# Patient Record
Sex: Female | Born: 1935 | Race: Black or African American | Hispanic: No | Smoking: Never smoker
Health system: Southern US, Community
[De-identification: ages and names within clinical notes are randomized; demographics above are authoritative.]

## PROBLEM LIST (undated history)

## (undated) DIAGNOSIS — G629 Polyneuropathy, unspecified: Secondary | ICD-10-CM

## (undated) DIAGNOSIS — G4733 Obstructive sleep apnea (adult) (pediatric): Secondary | ICD-10-CM

## (undated) DIAGNOSIS — I10 Essential (primary) hypertension: Secondary | ICD-10-CM

## (undated) DIAGNOSIS — D689 Coagulation defect, unspecified: Secondary | ICD-10-CM

## (undated) DIAGNOSIS — I639 Cerebral infarction, unspecified: Secondary | ICD-10-CM

## (undated) DIAGNOSIS — E785 Hyperlipidemia, unspecified: Secondary | ICD-10-CM

## (undated) DIAGNOSIS — I2510 Atherosclerotic heart disease of native coronary artery without angina pectoris: Secondary | ICD-10-CM

## (undated) DIAGNOSIS — I4819 Other persistent atrial fibrillation: Secondary | ICD-10-CM

## (undated) DIAGNOSIS — R569 Unspecified convulsions: Secondary | ICD-10-CM

## (undated) DIAGNOSIS — I2699 Other pulmonary embolism without acute cor pulmonale: Secondary | ICD-10-CM

## (undated) DIAGNOSIS — K56609 Unspecified intestinal obstruction, unspecified as to partial versus complete obstruction: Secondary | ICD-10-CM

## (undated) DIAGNOSIS — F0390 Unspecified dementia without behavioral disturbance: Secondary | ICD-10-CM

## (undated) DIAGNOSIS — I509 Heart failure, unspecified: Secondary | ICD-10-CM

## (undated) DIAGNOSIS — I82409 Acute embolism and thrombosis of unspecified deep veins of unspecified lower extremity: Secondary | ICD-10-CM

## (undated) HISTORY — DX: Hyperlipidemia, unspecified: E78.5

## (undated) HISTORY — PX: CHOLECYSTECTOMY: SHX55

## (undated) HISTORY — DX: Coagulation defect, unspecified: D68.9

## (undated) HISTORY — DX: Acute embolism and thrombosis of unspecified deep veins of unspecified lower extremity: I82.409

## (undated) HISTORY — DX: Unspecified dementia, unspecified severity, without behavioral disturbance, psychotic disturbance, mood disturbance, and anxiety: F03.90

## (undated) HISTORY — PX: ABDOMINAL AORTIC ANEURYSM REPAIR: SUR1152

## (undated) HISTORY — DX: Unspecified convulsions: R56.9

## (undated) HISTORY — DX: Essential (primary) hypertension: I10

## (undated) HISTORY — DX: Obstructive sleep apnea (adult) (pediatric): G47.33

## (undated) HISTORY — PX: ABDOMINAL HYSTERECTOMY: SHX81

## (undated) HISTORY — DX: Cerebral infarction, unspecified: I63.9

## (undated) HISTORY — DX: Polyneuropathy, unspecified: G62.9

---

## 2001-01-13 HISTORY — PX: SPINAL FUSION: SHX223

## 2015-01-17 ENCOUNTER — Encounter: Payer: Self-pay | Admitting: Internal Medicine

## 2015-01-17 ENCOUNTER — Non-Acute Institutional Stay (SKILLED_NURSING_FACILITY): Payer: Medicare Other | Admitting: Internal Medicine

## 2015-01-17 DIAGNOSIS — E785 Hyperlipidemia, unspecified: Secondary | ICD-10-CM | POA: Diagnosis not present

## 2015-01-17 DIAGNOSIS — F03918 Unspecified dementia, unspecified severity, with other behavioral disturbance: Secondary | ICD-10-CM

## 2015-01-17 DIAGNOSIS — J398 Other specified diseases of upper respiratory tract: Secondary | ICD-10-CM | POA: Diagnosis not present

## 2015-01-17 DIAGNOSIS — Z86711 Personal history of pulmonary embolism: Secondary | ICD-10-CM | POA: Diagnosis not present

## 2015-01-17 DIAGNOSIS — I110 Hypertensive heart disease with heart failure: Secondary | ICD-10-CM | POA: Insufficient documentation

## 2015-01-17 DIAGNOSIS — I639 Cerebral infarction, unspecified: Secondary | ICD-10-CM | POA: Insufficient documentation

## 2015-01-17 DIAGNOSIS — F329 Major depressive disorder, single episode, unspecified: Secondary | ICD-10-CM

## 2015-01-17 DIAGNOSIS — F0391 Unspecified dementia with behavioral disturbance: Secondary | ICD-10-CM | POA: Diagnosis not present

## 2015-01-17 DIAGNOSIS — I10 Essential (primary) hypertension: Secondary | ICD-10-CM

## 2015-01-17 DIAGNOSIS — N3000 Acute cystitis without hematuria: Secondary | ICD-10-CM

## 2015-01-17 DIAGNOSIS — I4891 Unspecified atrial fibrillation: Secondary | ICD-10-CM

## 2015-01-17 DIAGNOSIS — F32A Depression, unspecified: Secondary | ICD-10-CM

## 2015-01-17 DIAGNOSIS — G4733 Obstructive sleep apnea (adult) (pediatric): Secondary | ICD-10-CM | POA: Insufficient documentation

## 2015-01-17 DIAGNOSIS — G9340 Encephalopathy, unspecified: Secondary | ICD-10-CM | POA: Diagnosis not present

## 2015-01-17 DIAGNOSIS — R569 Unspecified convulsions: Secondary | ICD-10-CM | POA: Diagnosis not present

## 2015-01-17 DIAGNOSIS — D689 Coagulation defect, unspecified: Secondary | ICD-10-CM | POA: Insufficient documentation

## 2015-01-17 NOTE — Progress Notes (Signed)
MRN: [id number] Name: Brandi Heath  Sex: female Age: 82 y.o. DOB: October 16, 1927  [facility]: [facility] Facility/Room:108 Level Of Care: SNF Provider: Merrilee Seashore D Emergency Contacts: No emergency contact information on file.  Code Status:   Allergies: Penicillins  Chief Complaint  Patient presents with  . New Admit To SNF    HPI: Patient is 82 y.o. female with chronic atrial fib,DVT/PE, HTN, seizure disorder and hx CVA who was brought to ED for diarrhea. She was noted to have AF with RVR. She was admitted to [facility] from 12/26- 01/17/2015 where she was tx for AF with a drip then oral med, hypotension, tx with IVF and hyperkalemia. Pt is admitted to SNF for generalized weakness. While at SNF she will be followed for seizures ,tx with dilantin, chronic PE, tx with eliquis and depression, tx with cymbalta.    Past Medical History  Diagnosis Date  . OSA (obstructive sleep apnea)   . Dementia without behavioral disturbance   . Seizures (HCC)   . Hyperlipidemia   . Hypertension   . Clotting disorder (HCC)   . Stroke (HCC)   . DVT (deep venous thrombosis) (HCC)   . Polyneuropathy [facility])     Past Surgical History  Procedure Laterality Date  . Abdominal hysterectomy    . Cholecystectomy    . Abdominal aortic aneurysm repair    . Spinal fusion  2003      Medication List       This list is accurate as of: 01/17/15 11:59 PM.  Always use your most recent med list.               calcium-vitamin D 500-200 MG-UNIT tablet  Take 1 tablet by mouth 2 (two) times daily.     diltiazem 360 MG 24 hr capsule  Commonly known as:  TIAZAC  Take 360 mg by mouth daily.     diphenoxylate-atropine 2.5-0.025 MG tablet  Commonly known as:  LOMOTIL  Take 1 tablet by mouth 4 (four) times daily as needed for diarrhea or loose stools.     donepezil 10 MG tablet  Commonly known as:  ARICEPT  Take 10 mg by mouth at bedtime.     DULoxetine 60 MG capsule  Commonly known as:  CYMBALTA  Take 60 mg by  mouth 2 (two) times daily.     ELIQUIS 2.5 MG Tabs tablet  Generic drug:  apixaban  Take 2.5 mg by mouth 2 (two) times daily.     famotidine 20 MG tablet  Commonly known as:  PEPCID  Take 20 mg by mouth 2 (two) times daily.     fexofenadine 180 MG tablet  Commonly known as:  ALLEGRA  Take 180 mg by mouth daily.     gabapentin 600 MG tablet  Commonly known as:  NEURONTIN  Take 1,200 mg by mouth at bedtime.     metoprolol 50 MG tablet  Commonly known as:  LOPRESSOR  Take 50 mg by mouth 2 (two) times daily.     phenytoin 200 MG ER capsule  Commonly known as:  DILANTIN  Take 200 mg by mouth 2 (two) times daily.        Meds ordered this encounter  Medications  . diltiazem (TIAZAC) 360 MG 24 hr capsule    Sig: Take 360 mg by mouth daily.  Marland Kitchen donepezil (ARICEPT) 10 MG tablet    Sig: Take 10 mg by mouth at bedtime.  . metoprolol (LOPRESSOR) 50 MG tablet  Sig: Take 50 mg by mouth 2 (two) times daily.  . phenytoin (DILANTIN) 200 MG ER capsule    Sig: Take 200 mg by mouth 2 (two) times daily.  . fexofenadine (ALLEGRA) 180 MG tablet    Sig: Take 180 mg by mouth daily.  . Calcium Carbonate-Vitamin D (CALCIUM-VITAMIN D) 500-200 MG-UNIT tablet    Sig: Take 1 tablet by mouth 2 (two) times daily.  . diphenoxylate-atropine (LOMOTIL) 2.5-0.025 MG tablet    Sig: Take 1 tablet by mouth 4 (four) times daily as needed for diarrhea or loose stools.  . DULoxetine (CYMBALTA) 60 MG capsule    Sig: Take 60 mg by mouth 2 (two) times daily.  Marland Kitchen apixaban (ELIQUIS) 2.5 MG TABS tablet    Sig: Take 2.5 mg by mouth 2 (two) times daily.  . famotidine (PEPCID) 20 MG tablet    Sig: Take 20 mg by mouth 2 (two) times daily.  Marland Kitchen gabapentin (NEURONTIN) 600 MG tablet    Sig: Take 1,200 mg by mouth at bedtime.     There is no immunization history on file for this patient.  Social History  Substance Use Topics  . Smoking status: Never Smoker   . Smokeless tobacco: Not on file  . Alcohol Use: No     Family history is + HTN, HD    Review of Systems  DATA OBTAINED: from patient, nurse GENERAL:  no fevers,+ fatigue, appetite changes SKIN: No itching, rash or wounds EYES: No eye pain, redness, discharge EARS: No earache, tinnitus, change in hearing NOSE: No congestion, drainage or bleeding  MOUTH/THROAT: No mouth or tooth pain, No sore throat RESPIRATORY: No cough, wheezing, SOB CARDIAC: No chest pain, palpitations, lower extremity edema  GI: No abdominal pain, No N/V/D or constipation, No heartburn or reflux  GU: No dysuria, frequency or urgency, or incontinence  MUSCULOSKELETAL: No unrelieved bone/joint pain NEUROLOGIC: No headache, dizziness or focal weakness PSYCHIATRIC: No c/o anxiety or sadness   Filed Vitals:   01/17/15 2108  BP: 125/82  Pulse: 68  Temp: 97.3 F (36.3 C)  Resp: 18    SpO2 Readings from Last 1 Encounters:  No data found for SpO2        Physical Exam  GENERAL APPEARANCE: Alert, conversant,  No acute distress.  SKIN: No diaphoresis rash HEAD: Normocephalic, atraumatic  EYES: Conjunctiva/lids clear. Pupils round, reactive. EOMs intact.  EARS: External exam WNL, canals clear. Hearing grossly normal.  NOSE: No deformity or discharge.  MOUTH/THROAT: Lips w/o lesions  RESPIRATORY: Breathing is even, unlabored. Lung sounds are clear   CARDIOVASCULAR: Heart RRR no murmurs, rubs or gallops. No peripheral edema.   GASTROINTESTINAL: Abdomen is soft, non-tender, not distended w/ normal bowel sounds. GENITOURINARY: Bladder non tender, not distended  MUSCULOSKELETAL: No abnormal joints or musculature NEUROLOGIC:  Cranial nerves 2-12 grossly intact. Moves all extremities  PSYCHIATRIC: Mood and affect appropriate to situation, no behavioral issues  Patient Active Problem List   Diagnosis Date Noted  . Atrial fibrillation with RVR (HCC) 01/20/2015  . Acute encephalopathy 01/20/2015  . UTI (urinary tract infection) 01/20/2015  .  Tracheobronchomalacia 01/20/2015  . Personal history of PE (pulmonary embolism) 01/20/2015  . Depression 01/20/2015  . OSA (obstructive sleep apnea)   . Dementia with behavioral disturbance   . Seizures (HCC)   . Hyperlipidemia   . Hypertension   . Clotting disorder (HCC)   . Cerebrovascular accident (CVA) due to thrombosis (HCC)     CBC No results found for: WBC, RBC,  HGB, HCT, PLT, MCV, LYMPHSABS, MONOABS, EOSABS, BASOSABS  CMP  No results found for: NA, K, CL, CO2, GLUCOSE, BUN, CREATININE, CALCIUM, PROT, ALBUMIN, AST, ALT, ALKPHOS, BILITOT, GFRNONAA, GFRAA  No results found for: HGBA1C   Patient was never admitted.  Not all labs, radiology exams or other studies done during hospitalization come through on my EPIC note; however they are reviewed by me.    Assessment and Plan  Atrial fibrillation with RVR (HCC) Tx with IV diltiazem, then converted to oral; SNF - cont metoprolol 50 mg BID, diltiazem 24 hr 360 mg with eliquis as prophylaxis  Acute encephalopathy With underlying dementia;functioning well until recently, has lived with daughter for past month-seen by neurology and started on haldol 0.5 mg prn for periods of agitation at home;sed/hyp were avoided in hospital and pt was stable on haldol prn SNF - cont Haldol prn  Dementia with behavioral disturbance With underlying dementia;functioning well until recently, has lived with daughter for past month-seen by neurology and started on haldol 0.5 mg prn for periods of agitation at home;sed/hyp were avoided in hospital and pt was stable on haldol prn SNF - cont prn haldol and aricept 10 mg nightly  UTI (urinary tract infection) Pt had been treated for UTI prior to admit, but 2/2 diarrhea pt was not put on abx in hospital;new urine cx was neg  Hypertension Initially pt was hypotensive which responded to IVF;pt's lisinopril was d/c since dilt was started SNF - cont diltiazem amd metoprolol; BP with good control  Seizures  (HCC) SNF - stable on dilantin;plan - cont dilantin  Hyperlipidemia SNF - cont zocor;not stated as uncontrolled  Tracheobronchomalacia Incidental finding on chest CT; narrowing of bronchi as well; pt without sx; has been told to f/u with pulmonology as outpt  Personal history of PE (pulmonary embolism) SNF - chronic and stable- cont eliquis  Depression SNF - chronic sand stable;cont cymbalta 60 mg BID   Time spent > 45 min;> 50% of time with patient was spent reviewing records, labs, tests and studies, counseling and developing plan of care  Margit Hanks, MD

## 2015-01-20 ENCOUNTER — Encounter: Payer: Self-pay | Admitting: Internal Medicine

## 2015-01-20 DIAGNOSIS — N390 Urinary tract infection, site not specified: Secondary | ICD-10-CM | POA: Insufficient documentation

## 2015-01-20 DIAGNOSIS — J398 Other specified diseases of upper respiratory tract: Secondary | ICD-10-CM | POA: Insufficient documentation

## 2015-01-20 DIAGNOSIS — G9340 Encephalopathy, unspecified: Secondary | ICD-10-CM | POA: Insufficient documentation

## 2015-01-20 DIAGNOSIS — Z86711 Personal history of pulmonary embolism: Secondary | ICD-10-CM | POA: Insufficient documentation

## 2015-01-20 DIAGNOSIS — I4891 Unspecified atrial fibrillation: Secondary | ICD-10-CM | POA: Insufficient documentation

## 2015-01-20 DIAGNOSIS — F329 Major depressive disorder, single episode, unspecified: Secondary | ICD-10-CM | POA: Insufficient documentation

## 2015-01-20 DIAGNOSIS — F32A Depression, unspecified: Secondary | ICD-10-CM | POA: Insufficient documentation

## 2015-01-20 NOTE — Assessment & Plan Note (Signed)
Tx with IV diltiazem, then converted to oral; SNF - cont metoprolol 50 mg BID, diltiazem 24 hr 360 mg with eliquis as prophylaxis

## 2015-01-20 NOTE — Assessment & Plan Note (Signed)
SNF - chronic sand stable;cont cymbalta 60 mg BID

## 2015-01-20 NOTE — Assessment & Plan Note (Signed)
SNF - stable on dilantin;plan - cont dilantin

## 2015-01-20 NOTE — Assessment & Plan Note (Signed)
SNF - chronic and stable- cont eliquis

## 2015-01-20 NOTE — Assessment & Plan Note (Addendum)
With underlying dementia;functioning well until recently, has lived with daughter for past month-seen by neurology and started on haldol 0.5 mg prn for periods of agitation at home;sed/hyp were avoided in hospital and pt was stable on haldol prn SNF - cont Haldol prn

## 2015-01-20 NOTE — Assessment & Plan Note (Signed)
Incidental finding on chest CT; narrowing of bronchi as well; pt without sx; has been told to f/u with pulmonology as outpt

## 2015-01-20 NOTE — Assessment & Plan Note (Signed)
Pt had been treated for UTI prior to admit, but 2/2 diarrhea pt was not put on abx in hospital;new urine cx was neg

## 2015-01-20 NOTE — Assessment & Plan Note (Signed)
SNF - cont zocor;not stated as uncontrolled

## 2015-01-20 NOTE — Assessment & Plan Note (Signed)
With underlying dementia;functioning well until recently, has lived with daughter for past month-seen by neurology and started on haldol 0.5 mg prn for periods of agitation at home;sed/hyp were avoided in hospital and pt was stable on haldol prn SNF - cont prn haldol and aricept 10 mg nightly

## 2015-01-20 NOTE — Assessment & Plan Note (Signed)
Initially pt was hypotensive which responded to IVF;pt's lisinopril was d/c since dilt was started SNF - cont diltiazem amd metoprolol; BP with good control

## 2015-01-25 ENCOUNTER — Encounter: Payer: Self-pay | Admitting: Internal Medicine

## 2015-01-25 ENCOUNTER — Non-Acute Institutional Stay (SKILLED_NURSING_FACILITY): Payer: Medicare Other | Admitting: Internal Medicine

## 2015-01-25 DIAGNOSIS — R609 Edema, unspecified: Secondary | ICD-10-CM | POA: Diagnosis not present

## 2015-01-25 DIAGNOSIS — Z86711 Personal history of pulmonary embolism: Secondary | ICD-10-CM

## 2015-01-25 NOTE — Progress Notes (Signed)
Patient ID: Brandi Heath, female   DOB: 1927-05-26, 82 y.o.   MRN: [id number] MRN: [id number] Name: Brandi Heath  Sex: female Age: 82 y.o. DOB: 02-14-1927  [facility] Facility/Room:108 Level Of Care: SNF Provider: Roena Heath Emergency Contacts: No emergency contact information on file.  Code Status:   Allergies: Penicillins Chief complaint-acute visit secondary to increased edema   HPI: Patient is 82 y.o. female with chronic atrial fib,DVT/PE, HTN, seizure disorder and hx CVA who was brought to ED for diarrhea. She was noted to have AF with RVR. She was [facility] from 12/26- 01/17/2015 where she was tx for AF with a drip then oral med, hypotension, tx with IVF and hyperkalemia. Pt was admitted to SNF for generalized weakness. While at SNF  followed for seizures ,tx with dilantin, chronic PE, tx with eliquis and depression, tx with cymbalta She had been on Lasix previous to her hospitalization secondary to apparently increased edema-however this was discontinued at some point during the hospitalization.  Family has stated they feel she is having some increased edema  and would like the Lasix restarted.  Patient is a poor historian secondary to dementia but she is not complaining of any increased shortness of breath or chest pain-   Past Medical History  Diagnosis Date  . OSA (obstructive sleep apnea)   . Dementia without behavioral disturbance   . Seizures (HCC)   . Hyperlipidemia   . Hypertension   . Clotting disorder (HCC)   . Stroke (HCC)   . DVT (deep venous thrombosis) (HCC)   . Polyneuropathy [facility])     Past Surgical History  Procedure Laterality Date  . Abdominal hysterectomy    . Cholecystectomy    . Abdominal aortic aneurysm repair    . Spinal fusion  2003      Medication List       This list is accurate as of: 01/25/15 11:59 PM.  Always use your most recent med list.               calcium-vitamin D 500-200 MG-UNIT tablet  Take 1 tablet by  mouth 2 (two) times daily.     diltiazem 360 MG 24 hr capsule  Commonly known as:  TIAZAC  Take 360 mg by mouth daily.     diphenoxylate-atropine 2.5-0.025 MG tablet  Commonly known as:  LOMOTIL  Take 1 tablet by mouth 4 (four) times daily as needed for diarrhea or loose stools.     donepezil 10 MG tablet  Commonly known as:  ARICEPT  Take 10 mg by mouth at bedtime.     DULoxetine 60 MG capsule  Commonly known as:  CYMBALTA  Take 60 mg by mouth 2 (two) times daily.     ELIQUIS 2.5 MG Tabs tablet  Generic drug:  apixaban  Take 2.5 mg by mouth 2 (two) times daily.     famotidine 20 MG tablet  Commonly known as:  PEPCID  Take 20 mg by mouth 2 (two) times daily.     fexofenadine 180 MG tablet  Commonly known as:  ALLEGRA  Take 180 mg by mouth daily.     furosemide 20 MG tablet  Commonly known as:  LASIX  Take 20 mg by mouth.     gabapentin 600 MG tablet  Commonly known as:  NEURONTIN  Take 1,200 mg by mouth at bedtime.     metoprolol 50 MG tablet  Commonly known as:  LOPRESSOR  Take 50 mg by  mouth 2 (two) times daily.     phenytoin 200 MG ER capsule  Commonly known as:  DILANTIN  Take 200 mg by mouth 2 (two) times daily.        Meds ordered this encounter  Medications  . furosemide (LASIX) 20 MG tablet    Sig: Take 20 mg by mouth.     There is no immunization history on file for this patient.  Social History  Substance Use Topics  . Smoking status: Never Smoker   . Smokeless tobacco: Not on file  . Alcohol Use: No    Family history is + HTN, HD    Review of Systems  DATA OBTAINED: from patient, nursefamily GENERAL:  no fevers,+ fatigue, appetite changes SKIN: No itching, rash or wounds EYES: No eye pain, redness, discharge EARS: No earache, tinnitus, change in hearing NOSE: No congestion, drainage or bleeding  MOUTH/THROAT: No mouth or tooth pain, No sore throat RESPIRATORY: No cough, wheezing, SOB CARDIAC: No chest pain, palpitations,  has  lower extremity edema  GI: No abdominal pain, No N/V/D or constipation, No heartburn or reflux  GU: No dysuria, frequency or urgency, or incontinence  MUSCULOSKELETAL: No unrelieved bone/joint pain NEUROLOGIC: No headache, dizziness or focal weakness PSYCHIATRIC: No c/o anxiety or sadness   Filed Vitals:   01/25/15 2114  BP: 124/84  Pulse: 89  Temp: 97.7 F (36.5 C)  Resp: 18          Physical Exam  GENERAL APPEARANCE: Alert, conversant,  No acute distress.  SKIN: No diaphoresis rash HEAD: Normocephalic, atraumatic  EYES: Conjunctiva/lids clear. Pupils round, reactive. EOMs intact.  EARS: External exam WNL, canals clear. Hearing grossly normal.  NOSE: No deformity or discharge.  MOUTH/THROAT: Lips w/o lesions  RESPIRATORY: Breathing is even, unlabored. Lung sounds are clear   CARDIOVASCULAR: Heartirregular,irregular no murmurs, rubs or gallops. Has bilateral lower extremity edema right greater than left this is cool to touch her family  says this has increased somewhat.   GASTROINTESTINAL: Abdomen is soft, non-tender, not distended w/ normal bowel sounds. GENITOURINARY: Bladder non tender, not distended  MUSCULOSKELETAL: No abnormal joints or musculature NEUROLOGIC:  Cranial nerves 2-12 grossly intact. Moves all extremities  PSYCHIATRIC: Mood and affect appropriate to situation, no behavioral issues  Patient Active Problem List   Diagnosis Date Noted  . Edema 01/25/2015  . Atrial fibrillation with RVR (HCC) 01/20/2015  . Acute encephalopathy 01/20/2015  . UTI (urinary tract infection) 01/20/2015  . Tracheobronchomalacia 01/20/2015  . Personal history of PE (pulmonary embolism) 01/20/2015  . Depression 01/20/2015  . OSA (obstructive sleep apnea)   . Dementia with behavioral disturbance   . Seizures (HCC)   . Hyperlipidemia   . Hypertension   . Clotting disorder (HCC)   . Cerebrovascular accident (CVA) due to thrombosis [facility])     Jan- fourth 2017.  WBC 5.9  hemoglobin 10.7 platelets 192.  Sodium 139 potassium 3.7 BUN 14 creatinine 0.7.  [place]ta phosphatase 161-albumin 3.4  Assessment and plan.  #1-history of edema-apparently this has increased somewhat during her stay here-she previously had been on Lasix-will restart this at 20 mg a day-at this point we'll hold her potassium since she's been having  unopposed potassium here for approximately a week-will see what the lab that we ordered for tomorrow tells [place] including a BMP and CBC.  Also will need a BMP next week to assure stability of her renal function as well as electrolytes.  Also will monitor weights daily for now noted  by provider of gain greater than 3 pounds to keep neye on weight gain and possibly increased edema  Patient does have a history of pulmonary embolism and apparently DVT previously in her right leg per her family-the edema somewhat greater on the right than left is not new-.  She is on Eliquis for anticoagulation  [id number]-of note greater than 25 minutes spent assessing patient-reviewing her chart-discussing her status with nursing staff-as well as discussing her status with her daughter via phone as well as with her otherr daughter in the room this evening. Marland Kitchen          Kennadi Albany C,

## 2015-01-26 ENCOUNTER — Non-Acute Institutional Stay (SKILLED_NURSING_FACILITY): Payer: Medicare Other | Admitting: Internal Medicine

## 2015-01-26 DIAGNOSIS — R400 Somnolence: Secondary | ICD-10-CM

## 2015-01-26 DIAGNOSIS — G4710 Hypersomnia, unspecified: Secondary | ICD-10-CM

## 2015-01-26 NOTE — Progress Notes (Signed)
MRN: [id number] Name: Dazani Feltman  Sex: female Age: 82 y.o. DOB: Mar 23, 1927  [facility] Facility/Room: Level Of Care: SNF Provider: Merrilee Seashore D Emergency Contacts: No emergency contact information on file.  Code Status:   Allergies: Penicillins  Chief Complaint  Patient presents with  . Acute Visit    HPI: Patient is 82 y.o. female whose family has felt that pt has been more sleepy than usual while at SNF. It was reported that they thought it was her haldol which was 1 mg qHS pr and per [facility] nurse pt was dec to 0.5 mg and now pt has been aggitated today.   Past Medical History  Diagnosis Date  . OSA (obstructive sleep apnea)   . Dementia without behavioral disturbance   . Seizures (HCC)   . Hyperlipidemia   . Hypertension   . Clotting disorder (HCC)   . Stroke (HCC)   . DVT (deep venous thrombosis) (HCC)   . Polyneuropathy [facility])     Past Surgical History  Procedure Laterality Date  . Abdominal hysterectomy    . Cholecystectomy    . Abdominal aortic aneurysm repair    . Spinal fusion  2003      Medication List       This list is accurate as of: 01/26/15 11:59 PM.  Always use your most recent med list.               calcium-vitamin D 500-200 MG-UNIT tablet  Take 1 tablet by mouth 2 (two) times daily.     diltiazem 360 MG 24 hr capsule  Commonly known as:  TIAZAC  Take 360 mg by mouth daily.     diphenoxylate-atropine 2.5-0.025 MG tablet  Commonly known as:  LOMOTIL  Take 1 tablet by mouth 4 (four) times daily as needed for diarrhea or loose stools.     donepezil 10 MG tablet  Commonly known as:  ARICEPT  Take 10 mg by mouth at bedtime.     DULoxetine 60 MG capsule  Commonly known as:  CYMBALTA  Take 60 mg by mouth 2 (two) times daily.     ELIQUIS 2.5 MG Tabs tablet  Generic drug:  apixaban  Take 2.5 mg by mouth 2 (two) times daily.     famotidine 20 MG tablet  Commonly known as:  PEPCID  Take 20 mg by mouth 2 (two) times daily.      fexofenadine 180 MG tablet  Commonly known as:  ALLEGRA  Take 180 mg by mouth daily.     furosemide 20 MG tablet  Commonly known as:  LASIX  Take 20 mg by mouth.     gabapentin 600 MG tablet  Commonly known as:  NEURONTIN  Take 1,200 mg by mouth at bedtime.     metoprolol 50 MG tablet  Commonly known as:  LOPRESSOR  Take 50 mg by mouth 2 (two) times daily.     phenytoin 200 MG ER capsule  Commonly known as:  DILANTIN  Take 200 mg by mouth 2 (two) times daily.        No orders of the defined types were placed in this encounter.     There is no immunization history on file for this patient.  Social History  Substance Use Topics  . Smoking status: Never Smoker   . Smokeless tobacco: Not on file  . Alcohol Use: No    Review of Systems  DATA OBTAINED: from patient, nurse- as in HPI GENERAL:  no  fevers, fatigue, appetite changes SKIN: No itching, rash HEENT: No complaint RESPIRATORY: No cough, wheezing, SOB CARDIAC: No chest pain, palpitations, lower extremity edema  GI: No abdominal pain, No N/V/D or constipation, No heartburn or reflux  GU: No dysuria, frequency or urgency, or incontinence  MUSCULOSKELETAL: No unrelieved bone/joint pain NEUROLOGIC: No headache, dizziness  PSYCHIATRIC: No overt anxiety or sadness  Filed Vitals:   01/28/15 1747  BP: 126/86  Pulse: 116  Temp: 97.6 F (36.4 C)  Resp: 18    Physical Exam  GENERAL APPEARANCE: Alert, conversant, No acute distress  SKIN: No diaphoresis rash, or wounds HEENT: Unremarkable RESPIRATORY: Breathing is even, unlabored. Lung sounds are clear   CARDIOVASCULAR: Heart RRR no murmurs, rubs or gallops. No peripheral edema  GASTROINTESTINAL: Abdomen is soft, non-tender, not distended w/ normal bowel sounds.  GENITOURINARY: Bladder non tender, not distended  MUSCULOSKELETAL: No abnormal joints or musculature NEUROLOGIC: Cranial nerves 2-12 grossly intact. Moves all extremities PSYCHIATRIC: Mood and  affect appropriate to situation, no behavioral issues  Patient Active Problem List   Diagnosis Date Noted  . Edema 01/25/2015  . Atrial fibrillation with RVR (HCC) 01/20/2015  . Acute encephalopathy 01/20/2015  . UTI (urinary tract infection) 01/20/2015  . Tracheobronchomalacia 01/20/2015  . Personal history of PE (pulmonary embolism) 01/20/2015  . Depression 01/20/2015  . OSA (obstructive sleep apnea)   . Dementia with behavioral disturbance   . Seizures (HCC)   . Hyperlipidemia   . Hypertension   . Clotting disorder (HCC)   . Cerebrovascular accident (CVA) due to thrombosis (HCC)         Assessment and Plan  SOMNALENCE- there are multiple meds that could be causing the problem; haldol is very low on the list but  Dilantin, cymbalta,  aricept and neurontin are on the list. I would start with cymbalta as the current dose of 60 mg BID is twice the recommended. Next on the list is dilantin and a dilantin level is pending. I also think pt's MS has changed a lot in past few months and family is processing this and looking at every avenue. Plan to meet with family to discuss. Also need to ensure that pt is wearing CPAP nightly. Will begin titrating cymbalta down 20 mg q week down to 60 mg daily.   Margit Hanks, MD

## 2015-01-28 ENCOUNTER — Encounter: Payer: Self-pay | Admitting: Internal Medicine

## 2015-02-07 ENCOUNTER — Encounter: Payer: Self-pay | Admitting: Internal Medicine

## 2015-02-07 ENCOUNTER — Non-Acute Institutional Stay (SKILLED_NURSING_FACILITY): Payer: Medicare Other | Admitting: Internal Medicine

## 2015-02-07 DIAGNOSIS — F329 Major depressive disorder, single episode, unspecified: Secondary | ICD-10-CM

## 2015-02-07 DIAGNOSIS — E785 Hyperlipidemia, unspecified: Secondary | ICD-10-CM

## 2015-02-07 DIAGNOSIS — I4891 Unspecified atrial fibrillation: Secondary | ICD-10-CM | POA: Diagnosis not present

## 2015-02-07 DIAGNOSIS — I10 Essential (primary) hypertension: Secondary | ICD-10-CM

## 2015-02-07 DIAGNOSIS — R569 Unspecified convulsions: Secondary | ICD-10-CM | POA: Diagnosis not present

## 2015-02-07 DIAGNOSIS — I63319 Cerebral infarction due to thrombosis of unspecified middle cerebral artery: Secondary | ICD-10-CM

## 2015-02-07 DIAGNOSIS — G4733 Obstructive sleep apnea (adult) (pediatric): Secondary | ICD-10-CM

## 2015-02-07 DIAGNOSIS — F0391 Unspecified dementia with behavioral disturbance: Secondary | ICD-10-CM

## 2015-02-07 DIAGNOSIS — G9340 Encephalopathy, unspecified: Secondary | ICD-10-CM

## 2015-02-07 DIAGNOSIS — D689 Coagulation defect, unspecified: Secondary | ICD-10-CM | POA: Diagnosis not present

## 2015-02-07 DIAGNOSIS — F32A Depression, unspecified: Secondary | ICD-10-CM

## 2015-02-07 DIAGNOSIS — Z86711 Personal history of pulmonary embolism: Secondary | ICD-10-CM

## 2015-02-07 DIAGNOSIS — J398 Other specified diseases of upper respiratory tract: Secondary | ICD-10-CM | POA: Diagnosis not present

## 2015-02-07 DIAGNOSIS — F03918 Unspecified dementia, unspecified severity, with other behavioral disturbance: Secondary | ICD-10-CM

## 2015-02-07 NOTE — Progress Notes (Signed)
MRN: [id number] Name: Brandi Heath  Sex: female Age: 82 y.o. DOB: Apr 21, 1927  [facility]: [facility] Facility/Room:108 Level Of Care: SNF Provider: Merrilee Seashore D Emergency Contacts: Extended Emergency Contact Information Primary Emergency Contact: Tillison,Tammy Address: [address] [place], [place] [address] [place] of [place] Phone: [phone] Relation: Daughter Secondary Emergency Contact: Gale Journey [place] of [place] Mobile Phone: [phone] Relation: Daughter  Code Status:   Allergies: Penicillins  Chief Complaint  Patient presents with  . Discharge Note    HPI: Patient is 82 y.o. female with chronic atrial fib,DVT/PE, HTN, seizure disorder and hx CVA who was brought to ED for diarrhea. She was noted to have AF with RVR. She was admitted to [facility] from 12/26- 01/17/2015 where she was tx for AF with a drip then oral med, hypotension, tx with IVF and hyperkalemia. Pt is admitted to SNF for generalized weakness. She is now ready to be discharged to home. She had some problem with daytime somnalence which improved wen her cymbalta dose was changed to 30 mg in the am and 60 mg at night.  Past Medical History  Diagnosis Date  . OSA (obstructive sleep apnea)   . Dementia without behavioral disturbance   . Seizures (HCC)   . Hyperlipidemia   . Hypertension   . Clotting disorder (HCC)   . Stroke (HCC)   . DVT (deep venous thrombosis) (HCC)   . Polyneuropathy [facility])     Past Surgical History  Procedure Laterality Date  . Abdominal hysterectomy    . Cholecystectomy    . Abdominal aortic aneurysm repair    . Spinal fusion  2003      Medication List       This list is accurate as of: 02/07/15  1:27 PM.  Always use your most recent med list.               Acidophilus Caps capsule  Take 1 capsule by mouth daily. Called lactobacillus combo     calcium-vitamin D 500-200 MG-UNIT tablet  Take 1 tablet by mouth 2 (two) times daily.      DILANTIN 100 MG ER capsule  Generic drug:  phenytoin  Take 200 mg by mouth 2 (two) times daily.     diltiazem 360 MG 24 hr capsule  Commonly known as:  TIAZAC  Take 360 mg by mouth daily.     diphenoxylate-atropine 2.5-0.025 MG tablet  Commonly known as:  LOMOTIL  Take 1 tablet by mouth 4 (four) times daily as needed for diarrhea or loose stools.     donepezil 10 MG tablet  Commonly known as:  ARICEPT  Take 10 mg by mouth at bedtime.     DULoxetine 60 MG capsule  Commonly known as:  CYMBALTA  Take 60 mg by mouth at bedtime.     DULoxetine 30 MG capsule  Commonly known as:  CYMBALTA  Take 30 mg by mouth every morning.     ELIQUIS 2.5 MG Tabs tablet  Generic drug:  apixaban  Take 2.5 mg by mouth 2 (two) times daily.     famotidine 20 MG tablet  Commonly known as:  PEPCID  Take 20 mg by mouth 2 (two) times daily.     fexofenadine 180 MG tablet  Commonly known as:  ALLEGRA  Take 180 mg by mouth daily.     furosemide 20 MG tablet  Commonly known as:  LASIX  Take 20 mg  by mouth.     gabapentin 600 MG tablet  Commonly known as:  NEURONTIN  Take 1,200 mg by mouth at bedtime.     guaiFENesin 600 MG 12 hr tablet  Commonly known as:  MUCINEX  Take 600 mg by mouth 2 (two) times daily.     Magnesium Oxide -Mg Supplement 400 MG Caps  Take 1 tablet by mouth daily.     metoprolol 50 MG tablet  Commonly known as:  LOPRESSOR  Take 50 mg by mouth 2 (two) times daily.     mirabegron ER 25 MG Tb24 tablet  Commonly known as:  MYRBETRIQ  Take 25 mg by mouth daily.     montelukast 10 MG tablet  Commonly known as:  SINGULAIR  Take 10 mg by mouth at bedtime.     omeprazole 40 MG capsule  Commonly known as:  PRILOSEC  Take 40 mg by mouth 2 (two) times daily.     simvastatin 20 MG tablet  Commonly known as:  ZOCOR  Take 20 mg by mouth daily.     travoprost (benzalkonium) 0.004 % ophthalmic solution  Commonly known as:  TRAVATAN  Place 1 drop into both eyes at bedtime.      vitamin B-12 1000 MCG tablet  Commonly known as:  CYANOCOBALAMIN  Take 1,000 mcg by mouth daily.        Meds ordered this encounter  Medications  . DULoxetine (CYMBALTA) 30 MG capsule    Sig: Take 30 mg by mouth every morning.  . simvastatin (ZOCOR) 20 MG tablet    Sig: Take 20 mg by mouth daily.  Marland Kitchen omeprazole (PRILOSEC) 40 MG capsule    Sig: Take 40 mg by mouth 2 (two) times daily.  . montelukast (SINGULAIR) 10 MG tablet    Sig: Take 10 mg by mouth at bedtime.  . travoprost, benzalkonium, (TRAVATAN) 0.004 % ophthalmic solution    Sig: Place 1 drop into both eyes at bedtime.  . Magnesium Oxide -Mg Supplement 400 MG CAPS    Sig: Take 1 tablet by mouth daily.  . mirabegron ER (MYRBETRIQ) 25 MG TB24 tablet    Sig: Take 25 mg by mouth daily.  . vitamin B-12 (CYANOCOBALAMIN) 1000 MCG tablet    Sig: Take 1,000 mcg by mouth daily.  . phenytoin (DILANTIN) 100 MG ER capsule    Sig: Take 200 mg by mouth 2 (two) times daily.  Marland Kitchen guaiFENesin (MUCINEX) 600 MG 12 hr tablet    Sig: Take 600 mg by mouth 2 (two) times daily.  . Lactobacillus (ACIDOPHILUS) CAPS capsule    Sig: Take 1 capsule by mouth daily. Called lactobacillus combo     There is no immunization history on file for this patient.  Social History  Substance Use Topics  . Smoking status: Never Smoker   . Smokeless tobacco: Not on file  . Alcohol Use: No    Filed Vitals:   02/07/15 1254  BP: 117/67  Pulse: 72  Temp: 98.1 F (36.7 C)  Resp: 18    Physical Exam  GENERAL APPEARANCE: Alert, conversant. No acute distress.  HEENT: Unremarkable. RESPIRATORY: Breathing is even, unlabored. Lung sounds are clear   CARDIOVASCULAR: Heart RRR no murmurs, rubs or gallops. No peripheral edema.  GASTROINTESTINAL: Abdomen is soft, non-tender, not distended w/ normal bowel sounds.  NEUROLOGIC: Cranial nerves 2-12 grossly intact. Moves all extremities  Patient Active Problem List   Diagnosis Date Noted  . Edema 01/25/2015  .  Atrial fibrillation with RVR (  HCC) 01/20/2015  . Acute encephalopathy 01/20/2015  . UTI (urinary tract infection) 01/20/2015  . Tracheobronchomalacia 01/20/2015  . Personal history of PE (pulmonary embolism) 01/20/2015  . Depression 01/20/2015  . OSA (obstructive sleep apnea)   . Dementia with behavioral disturbance   . Seizures (HCC)   . Hyperlipidemia   . Hypertension   . Clotting disorder (HCC)   . Cerebrovascular accident (CVA) due to thrombosis (HCC)       Assessment and Plan  Pt is d/c to home with HH/OT/PT/nursing. Pt needs an outpt pulmonology follow up for an incidental finding of tracheobronchomalacia discovered while hospitalized.    Time spent > 30 min;> 50% of time with patient was spent reviewing records, labs, tests and studies, counseling and developing plan of care  Margit Hanks, MD

## 2015-03-06 ENCOUNTER — Other Ambulatory Visit: Payer: Self-pay | Admitting: Internal Medicine

## 2015-04-14 LAB — BASIC METABOLIC PANEL
BUN: 14 mg/dL (ref 4–21)
Creatinine: 0.9 mg/dL (ref 0.5–1.1)
GLUCOSE: 93 mg/dL
POTASSIUM: 3.9 mmol/L (ref 3.4–5.3)
SODIUM: 137 mmol/L (ref 137–147)

## 2015-04-14 LAB — CBC AND DIFFERENTIAL
HCT: 29 % — AB (ref 36–46)
Hemoglobin: 10.1 g/dL — AB (ref 12.0–16.0)
Platelets: 229 10*3/uL (ref 150–399)
WBC: 5 10^3/mL

## 2015-04-15 LAB — CBC AND DIFFERENTIAL
HCT: 30 % — AB (ref 36–46)
Hemoglobin: 10.2 g/dL — AB (ref 12.0–16.0)
PLATELETS: 222 10*3/uL (ref 150–399)
WBC: 5.4 10*3/mL

## 2015-04-15 LAB — BASIC METABOLIC PANEL
BUN: 11 mg/dL (ref 4–21)
CREATININE: 0.7 mg/dL (ref 0.5–1.1)
GLUCOSE: 104 mg/dL
Potassium: 3.6 mmol/L (ref 3.4–5.3)
Sodium: 130 mmol/L — AB (ref 137–147)

## 2015-04-16 LAB — BASIC METABOLIC PANEL
BUN: 8 mg/dL (ref 4–21)
Creatinine: 0.6 mg/dL (ref 0.5–1.1)
Glucose: 111 mg/dL
Potassium: 4.2 mmol/L (ref 3.4–5.3)
Sodium: 136 mmol/L — AB (ref 137–147)

## 2015-04-16 LAB — CBC AND DIFFERENTIAL
HEMATOCRIT: 32 % — AB (ref 36–46)
HEMOGLOBIN: 11 g/dL — AB (ref 12.0–16.0)
Platelets: 237 10*3/uL (ref 150–399)
WBC: 5.8 10*3/mL

## 2015-04-18 ENCOUNTER — Non-Acute Institutional Stay (SKILLED_NURSING_FACILITY): Payer: Medicare Other | Admitting: Internal Medicine

## 2015-04-18 ENCOUNTER — Encounter: Payer: Self-pay | Admitting: Internal Medicine

## 2015-04-18 DIAGNOSIS — Z8673 Personal history of transient ischemic attack (TIA), and cerebral infarction without residual deficits: Secondary | ICD-10-CM

## 2015-04-18 DIAGNOSIS — G4733 Obstructive sleep apnea (adult) (pediatric): Secondary | ICD-10-CM | POA: Diagnosis not present

## 2015-04-18 DIAGNOSIS — N390 Urinary tract infection, site not specified: Secondary | ICD-10-CM

## 2015-04-18 DIAGNOSIS — F0391 Unspecified dementia with behavioral disturbance: Secondary | ICD-10-CM

## 2015-04-18 DIAGNOSIS — D689 Coagulation defect, unspecified: Secondary | ICD-10-CM

## 2015-04-18 DIAGNOSIS — I10 Essential (primary) hypertension: Secondary | ICD-10-CM

## 2015-04-18 DIAGNOSIS — G9340 Encephalopathy, unspecified: Secondary | ICD-10-CM | POA: Diagnosis not present

## 2015-04-18 DIAGNOSIS — E785 Hyperlipidemia, unspecified: Secondary | ICD-10-CM | POA: Diagnosis not present

## 2015-04-18 DIAGNOSIS — N179 Acute kidney failure, unspecified: Secondary | ICD-10-CM | POA: Diagnosis not present

## 2015-04-18 DIAGNOSIS — R569 Unspecified convulsions: Secondary | ICD-10-CM

## 2015-04-18 DIAGNOSIS — G629 Polyneuropathy, unspecified: Secondary | ICD-10-CM | POA: Diagnosis not present

## 2015-04-18 DIAGNOSIS — F03918 Unspecified dementia, unspecified severity, with other behavioral disturbance: Secondary | ICD-10-CM

## 2015-04-18 DIAGNOSIS — F32A Depression, unspecified: Secondary | ICD-10-CM

## 2015-04-18 DIAGNOSIS — B9620 Unspecified Escherichia coli [E. coli] as the cause of diseases classified elsewhere: Secondary | ICD-10-CM

## 2015-04-18 DIAGNOSIS — I482 Chronic atrial fibrillation, unspecified: Secondary | ICD-10-CM

## 2015-04-18 DIAGNOSIS — F329 Major depressive disorder, single episode, unspecified: Secondary | ICD-10-CM

## 2015-04-18 NOTE — Progress Notes (Signed)
MRN: [id number] Name: Brandi Heath  Sex: female Age: 82 y.o. DOB: [birth year]  [facility] #: [facility] Facility/Room:102 Level Of Care: SNF Provider: Merrilee Seashore D Emergency Contacts: Extended Emergency Contact Information Primary Emergency Contact: Tillison,Tammy Address: [address]          [place], [place] [address] D[place] of [place] Phone: [phone] Relation: Daughter Secondary Emergency Contact: Gale Journey [place] Mobile Phone: [phone] Relation: Daughter  Code Status:   Allergies: Penicillins  Chief Complaint  Patient presents with  . New Admit To SNF    HPI: Patient is 82 y.o. female with chronic atrial fib,DVT/PE, HTN, seizure disorder and hx CVA who was admitted to [facility] from 3/30-4/3 for acute UTI and ARF complicated by encephalopathy and a progressive dementia. Pt is admitted to SNF for generalized weakness for OT/PT. While at SNF pt will be followed for AF, tx with diltiazem, metoprolol and eliquis, h/o CVA, tc with eliquis and seizures, tx with dilantin.  Past Medical History  Diagnosis Date  . OSA (obstructive sleep apnea)   . Dementia without behavioral disturbance   . Seizures (HCC)   . Hyperlipidemia   . Hypertension   . Clotting disorder (HCC)   . Stroke (HCC)   . DVT (deep venous thrombosis) (HCC)   . Polyneuropathy [facility])     Past Surgical History  Procedure Laterality Date  . Abdominal hysterectomy    . Cholecystectomy    . Abdominal aortic aneurysm repair    . Spinal fusion  2003      Medication List       This list is accurate as of: 04/18/15 11:59 PM.  Always use your most recent med list.               acetaminophen 650 MG CR tablet  Commonly known as:  TYLENOL  Take 650 mg by mouth every 6 (six) hours as needed for pain.     Acidophilus Caps capsule  Take 1 capsule by mouth daily. Called lactobacillus combo     calcium-vitamin D 500-200 MG-UNIT tablet  Take 1 tablet by mouth 2 (two) times  daily.     ciprofloxacin 500 MG tablet  Commonly known as:  CIPRO  Take 500 mg by mouth 2 (two) times daily. For 9 days     DILANTIN 100 MG ER capsule  Generic drug:  phenytoin  Take 200 mg by mouth 2 (two) times daily.     diltiazem 360 MG 24 hr capsule  Commonly known as:  TIAZAC  Take 360 mg by mouth daily.     docusate sodium 100 MG capsule  Commonly known as:  COLACE  Take 100 mg by mouth daily.     donepezil 10 MG tablet  Commonly known as:  ARICEPT  Take 10 mg by mouth at bedtime.     DULoxetine 60 MG capsule  Commonly known as:  CYMBALTA  Take 60 mg by mouth at bedtime.     ELIQUIS 2.5 MG Tabs tablet  Generic drug:  apixaban  Take 2.5 mg by mouth 2 (two) times daily.     famotidine 20 MG tablet  Commonly known as:  PEPCID  Take 20 mg by mouth 2 (two) times daily.     fexofenadine 180 MG tablet  Commonly known as:  ALLEGRA  Take 180 mg by mouth daily.     furosemide 20 MG tablet  Commonly known as:  LASIX  Take 20 mg by mouth.  FUSION PLUS Caps  Take 1 capsule by mouth daily.     guaiFENesin 600 MG 12 hr tablet  Commonly known as:  MUCINEX  Take 600 mg by mouth 2 (two) times daily.     Magnesium Oxide -Mg Supplement 400 MG Caps  Take 1 tablet by mouth daily.     megestrol 400 MG/10ML suspension  Commonly known as:  MEGACE  Take 400 mg by mouth daily.     metoprolol 50 MG tablet  Commonly known as:  LOPRESSOR  Take 50 mg by mouth 2 (two) times daily.     mirabegron ER 25 MG Tb24 tablet  Commonly known as:  MYRBETRIQ  Take 25 mg by mouth daily.     multivitamin with minerals tablet  Take 1 tablet by mouth daily.     omeprazole 40 MG capsule  Commonly known as:  PRILOSEC  Take 40 mg by mouth 2 (two) times daily.     Potassium Chloride ER 20 MEQ Tbcr  Take 40 mEq by mouth daily.     QUEtiapine 25 MG tablet  Commonly known as:  SEROQUEL  Take 25 mg by mouth at bedtime.     simvastatin 20 MG tablet  Commonly known as:  ZOCOR  Take 20  mg by mouth daily.     travoprost (benzalkonium) 0.004 % ophthalmic solution  Commonly known as:  TRAVATAN  Place 1 drop into both eyes at bedtime.     vitamin B-12 1000 MCG tablet  Commonly known as:  CYANOCOBALAMIN  Take 1,000 mcg by mouth daily.        Meds ordered this encounter  Medications  . acetaminophen (TYLENOL) 650 MG CR tablet    Sig: Take 650 mg by mouth every 6 (six) hours as needed for pain.  . ciprofloxacin (CIPRO) 500 MG tablet    Sig: Take 500 mg by mouth 2 (two) times daily. For 9 days  . megestrol (MEGACE) 400 MG/10ML suspension    Sig: Take 400 mg by mouth daily.  Marland Kitchen docusate sodium (COLACE) 100 MG capsule    Sig: Take 100 mg by mouth daily.  . QUEtiapine (SEROQUEL) 25 MG tablet    Sig: Take 25 mg by mouth at bedtime.  . Potassium Chloride ER 20 MEQ TBCR    Sig: Take 40 mEq by mouth daily.  . Iron-FA-B Cmp-C-Biot-Probiotic (FUSION PLUS) CAPS    Sig: Take 1 capsule by mouth daily.  . Multiple Vitamins-Minerals (MULTIVITAMIN WITH MINERALS) tablet    Sig: Take 1 tablet by mouth daily.     There is no immunization history on file for this patient.  Social History  Substance Use Topics  . Smoking status: Never Smoker   . Smokeless tobacco: Not on file  . Alcohol Use: No    Family history is + HTN, HD   Review of Systems  UTO  2/2 dementia; nursing without concerns    Filed Vitals:   04/18/15 1320  BP: 112/75  Pulse: 75  Temp: 97 F (36.1 C)  Resp: 18    SpO2 Readings from Last 1 Encounters:  No data found for SpO2        Physical Exam  GENERAL APPEARANCE: Alert BF, No acute distress.  SKIN: No diaphoresis rash HEAD: Normocephalic, atraumatic  EYES: Conjunctiva/lids clear. Pupils round, reactive. EOMs intact.  EARS: External exam WNL, canals clear. Hearing grossly normal.  NOSE: No deformity or discharge.  MOUTH/THROAT: Lips w/o lesions  RESPIRATORY: Breathing is even, unlabored. Lung sounds  are clear   CARDIOVASCULAR: Heart  irreg no murmurs, rubs or gallops. trace peripheral edema.   GASTROINTESTINAL: Abdomen is soft, non-tender, not distended w/ normal bowel sounds. GENITOURINARY: Bladder non tender, not distended  MUSCULOSKELETAL: No abnormal joints or musculature NEUROLOGIC:  Cranial nerves 2-12 grossly intact. Moves all extremities  PSYCHIATRIC: Mood and affect appropriate to situation, no behavioral issues  Patient Active Problem List   Diagnosis Date Noted  . E. coli UTI 04/22/2015  . Prerenal acute renal failure (HCC) 04/22/2015  . History of CVA (cerebrovascular accident) 04/22/2015  . AF (atrial fibrillation) (HCC) 04/22/2015  . Polyneuropathy (HCC) 04/22/2015  . Edema 01/25/2015  . Atrial fibrillation with RVR (HCC) 01/20/2015  . Acute encephalopathy 01/20/2015  . UTI (urinary tract infection) 01/20/2015  . Tracheobronchomalacia 01/20/2015  . Personal history of PE (pulmonary embolism) 01/20/2015  . Depression 01/20/2015  . OSA (obstructive sleep apnea)   . Dementia with behavioral disturbance   . Seizures (HCC)   . Hyperlipidemia   . Hypertension   . Clotting disorder (HCC)   . Cerebrovascular accident (CVA) due to thrombosis (HCC)     CBC No results found for: WBC, RBC, HGB, HCT, PLT, MCV, LYMPHSABS, MONOABS, EOSABS, BASOSABS  CMP  No results found for: NA, K, CL, CO2, GLUCOSE, BUN, CREATININE, CALCIUM, PROT, ALBUMIN, AST, ALT, ALKPHOS, BILITOT, GFRNONAA, GFRAA  No results found for: HGBA1C   Patient was never admitted.  Not all labs, radiology exams or other studies done during hospitalization come through on my EPIC note; however they are reviewed by me.    Assessment and Plan  E. coli UTI Tx started with rocephin and after MIC came back pt switche to cipro; SNF - cont cipro for 9 more days  Prerenal acute renal failure (HCC) 2/2 UTI poor po intake;improved with IVF  SNF - will f/u BMP  Acute encephalopathy Manifested with confusion, aggitation and paranoia  SNF -  reported back to baseline dementia  Dementia with behavioral disturbance SNF - cont aricept for dementi and seroquel for behavoirs  Hypertension SNF - controlled with dlitiazema and metoprolol; plan - cont both  History of CVA (cerebrovascular accident) SNF - stable on eliquis  AF (atrial fibrillation) (HCC) SNF - raye controlled on diltiazem and metoprolol;prophylaxed with eliquis  OSA (obstructive sleep apnea) SNF - refuses CPAP and O2  Polyneuropathy (HCC) SNF - cont neurontin  Clotting disorder (HCC) SNF - cont eliquis  Seizures (HCC) SNF - reported controlled on dilantin 200 mg BID;plan - cont  Hyperlipidemia SNF - cont zocor 20 mg daily  Depression SNF - not reported as unstable; cont cymbalta 60 mg daily   Time spent > 45 min;> 50% of time with patient was spent reviewing records, labs, tests and studies, counseling and developing plan of care  Margit Hanks, MD

## 2015-04-19 LAB — CBC AND DIFFERENTIAL
HEMATOCRIT: 31 % — AB (ref 36–46)
Hemoglobin: 10.4 g/dL — AB (ref 12.0–16.0)
PLATELETS: 225 10*3/uL (ref 150–399)
WBC: 7.4 10*3/mL

## 2015-04-19 LAB — BASIC METABOLIC PANEL
BUN: 10 mg/dL (ref 4–21)
Creatinine: 0.8 mg/dL (ref 0.5–1.1)
Glucose: 95 mg/dL
Potassium: 4 mmol/L (ref 3.4–5.3)
Sodium: 138 mmol/L (ref 137–147)

## 2015-04-22 DIAGNOSIS — N390 Urinary tract infection, site not specified: Principal | ICD-10-CM

## 2015-04-22 DIAGNOSIS — G629 Polyneuropathy, unspecified: Secondary | ICD-10-CM | POA: Insufficient documentation

## 2015-04-22 DIAGNOSIS — B9620 Unspecified Escherichia coli [E. coli] as the cause of diseases classified elsewhere: Secondary | ICD-10-CM | POA: Insufficient documentation

## 2015-04-22 DIAGNOSIS — Z8673 Personal history of transient ischemic attack (TIA), and cerebral infarction without residual deficits: Secondary | ICD-10-CM | POA: Insufficient documentation

## 2015-04-22 DIAGNOSIS — I4891 Unspecified atrial fibrillation: Secondary | ICD-10-CM | POA: Insufficient documentation

## 2015-04-22 DIAGNOSIS — N179 Acute kidney failure, unspecified: Secondary | ICD-10-CM | POA: Insufficient documentation

## 2015-04-22 NOTE — Assessment & Plan Note (Signed)
SNF - controlled with dlitiazema and metoprolol; plan - cont both

## 2015-04-22 NOTE — Assessment & Plan Note (Signed)
SNF - stable on eliquis

## 2015-04-22 NOTE — Assessment & Plan Note (Signed)
SNF - cont eliquis

## 2015-04-22 NOTE — Assessment & Plan Note (Signed)
SNF - refuses CPAP and O2

## 2015-04-22 NOTE — Assessment & Plan Note (Signed)
SNF - cont aricept for dementi and seroquel for behavoirs

## 2015-04-22 NOTE — Assessment & Plan Note (Signed)
SNF - not reported as unstable; cont cymbalta 60 mg daily

## 2015-04-22 NOTE — Assessment & Plan Note (Signed)
Manifested with confusion, aggitation and paranoia  SNF - reported back to baseline dementia

## 2015-04-22 NOTE — Assessment & Plan Note (Addendum)
SNF - reported controlled on dilantin 200 mg BID;plan - cont

## 2015-04-22 NOTE — Assessment & Plan Note (Signed)
SNF - raye controlled on diltiazem and metoprolol;prophylaxed with eliquis

## 2015-04-22 NOTE — Assessment & Plan Note (Signed)
SNF - cont zocor 20 mg daily

## 2015-04-22 NOTE — Assessment & Plan Note (Signed)
Tx started with rocephin and after MIC came back pt switche to cipro; SNF - cont cipro for 9 more days

## 2015-04-22 NOTE — Assessment & Plan Note (Signed)
SNF - cont neurontin 

## 2015-04-22 NOTE — Assessment & Plan Note (Signed)
2/2 UTI poor po intake;improved with IVF  SNF - will f/u BMP

## 2015-04-25 LAB — CBC AND DIFFERENTIAL
HEMATOCRIT: 30 % — AB (ref 36–46)
HEMOGLOBIN: 10 g/dL — AB (ref 12.0–16.0)
Platelets: 220 10*3/uL (ref 150–399)
WBC: 7.5 10*3/mL

## 2015-04-25 LAB — BASIC METABOLIC PANEL
BUN: 12 mg/dL (ref 4–21)
CREATININE: 0.7 mg/dL (ref <=1.1)
GLUCOSE: 87 mg/dL
Potassium: 4.4 mmol/L (ref 3.4–5.3)
Sodium: 140 mmol/L (ref 137–147)

## 2015-05-10 ENCOUNTER — Encounter: Payer: Self-pay | Admitting: *Deleted

## 2015-05-24 ENCOUNTER — Non-Acute Institutional Stay (SKILLED_NURSING_FACILITY): Payer: Medicare Other | Admitting: Internal Medicine

## 2015-05-24 ENCOUNTER — Encounter: Payer: Self-pay | Admitting: Internal Medicine

## 2015-05-24 DIAGNOSIS — R6884 Jaw pain: Secondary | ICD-10-CM

## 2015-05-24 DIAGNOSIS — R569 Unspecified convulsions: Secondary | ICD-10-CM

## 2015-05-24 DIAGNOSIS — F03918 Unspecified dementia, unspecified severity, with other behavioral disturbance: Secondary | ICD-10-CM

## 2015-05-24 DIAGNOSIS — I4891 Unspecified atrial fibrillation: Secondary | ICD-10-CM | POA: Diagnosis not present

## 2015-05-24 DIAGNOSIS — Z8673 Personal history of transient ischemic attack (TIA), and cerebral infarction without residual deficits: Secondary | ICD-10-CM | POA: Diagnosis not present

## 2015-05-24 DIAGNOSIS — F0391 Unspecified dementia with behavioral disturbance: Secondary | ICD-10-CM

## 2015-05-24 NOTE — Progress Notes (Signed)
Location:  [facility] Room Number: 302/D Place of Service:  SNF [phone]) Provider:  Edmon Crape  No primary care provider on file.  No care team member to display  Extended Emergency Contact Information Primary Emergency Contact: Tillison,Tammy Address: [address] [place], [place] [address] [place] of [place] Phone: [phone] Relation: Daughter Secondary Emergency Contact: Gale Journey [place] of [place] Mobile Phone: [phone] Relation: Daughter   Goals of care: Advanced Directive information Advanced Directives 05/24/2015  Does patient have an advance directive? Yes  Does patient want to make changes to advanced directive? No - Patient declined  Copy of advanced directive(s) in chart? Yes     Chief Complaint  Patient presents with  . Acute Visit    Patients c/o has pain in jaw   Medical management of chronic medical issues including dementia--Afibn-history CVA-seizure disorder- HPI:  Pt is a 82 y.o. female seen today for an acute visit for follow-up of jaw pain-also medical management of issues as noted above.  Patient initially admitted to skilled nursing after hospitalization for acute renal failure and UTI-this was treated aggressively in the hospital and has been stable since she has been here apparently she is eating and drinking fairly well.  She does have a history of dementia and cannot really give any review of systems but nursing does not report any issues other than apparently some recent complaints of jaw pain.  However today patient is denying jaw pain saying her pain medication which appears to be Tylenol is essentially resolved at.  She does not complain of any trouble swallowing.  Patient states that she had seen a doctor before for jaw pain and was told it was arthritis.  Her other medical issues appear to be stable.  She does have a history of seizure disorder on Dilantin but I do  not see reports of any recent seizures.  She does have atrial fibrillation which appears rate controlled she is on diltiazem as well as metoprolol she is on telemetry quit for anticoagulation.  In regards to dementia she is on Aricept as well as Seroquel apparently at times her been behavior disturbances but this also appears to have stabilized.  Currently nursing does not report any other concerns.     Past Medical History  Diagnosis Date  . OSA (obstructive sleep apnea)   . Dementia without behavioral disturbance   . Seizures (HCC)   . Hyperlipidemia   . Hypertension   . Clotting disorder (HCC)   . Stroke (HCC)   . DVT (deep venous thrombosis) (HCC)   . Polyneuropathy [facility])    Past Surgical History  Procedure Laterality Date  . Abdominal hysterectomy    . Cholecystectomy    . Abdominal aortic aneurysm repair    . Spinal fusion  2003    Allergies  Allergen Reactions  . Penicillins       Medication List       This list is accurate as of: 05/24/15  2:47 PM.  Always use your most recent med list.               acetaminophen 650 MG CR tablet  Commonly known as:  TYLENOL  Take 650 mg by mouth every 4 (four) hours as needed for pain.     calcium-vitamin D 500-200 MG-UNIT tablet  Take 1 tablet by mouth 2 (two) times daily.     DILANTIN 100 MG  ER capsule  Generic drug:  phenytoin  Take 200 mg by mouth 2 (two) times daily.     diltiazem 360 MG 24 hr capsule  Commonly known as:  TIAZAC  Take 360 mg by mouth daily.     docusate sodium 100 MG capsule  Commonly known as:  COLACE  Take 100 mg by mouth daily.     donepezil 10 MG tablet  Commonly known as:  ARICEPT  Take 10 mg by mouth at bedtime.     DULoxetine 60 MG capsule  Commonly known as:  CYMBALTA  Take 60 mg by mouth at bedtime.     ELIQUIS 2.5 MG Tabs tablet  Generic drug:  apixaban  Take 2.5 mg by mouth 2 (two) times daily.     ENSURE ENLIVE PO  Take by mouth. Give daily to support weight  status     fexofenadine 180 MG tablet  Commonly known as:  ALLEGRA  Take 180 mg by mouth daily.     furosemide 20 MG tablet  Commonly known as:  LASIX  Take 20 mg by mouth.     FUSION PLUS Caps  Take 1 capsule by mouth daily.     Magnesium Oxide -Mg Supplement 400 MG Caps  Take 1 tablet by mouth daily.     megestrol 400 MG/10ML suspension  Commonly known as:  MEGACE  Take 400 mg by mouth daily.     metoprolol 50 MG tablet  Commonly known as:  LOPRESSOR  Take 50 mg by mouth 2 (two) times daily.     mirabegron ER 25 MG Tb24 tablet  Commonly known as:  MYRBETRIQ  Take 25 mg by mouth daily. For over active bladder     multivitamin with minerals tablet  Take 1 tablet by mouth daily.     NON FORMULARY  Magic cup daily for dinner     omeprazole 40 MG capsule  Commonly known as:  PRILOSEC  Take 40 mg by mouth 2 (two) times daily.     Potassium Chloride ER 20 MEQ Tbcr  Take 40 mEq by mouth daily.     promethazine 25 MG tablet  Commonly known as:  PHENERGAN  Take 1 tablet by mouth every 6 hours x 3 doses for nausea and vomiting     QUEtiapine 25 MG tablet  Commonly known as:  SEROQUEL  Take 25 mg by mouth at bedtime.     simvastatin 20 MG tablet  Commonly known as:  ZOCOR  Take 20 mg by mouth daily.     travoprost (benzalkonium) 0.004 % ophthalmic solution  Commonly known as:  TRAVATAN  Place 1 drop into both eyes at bedtime.     vitamin B-12 1000 MCG tablet  Commonly known as:  CYANOCOBALAMIN  Take 1,000 mcg by mouth daily.        Review of Systems--this is limited by dementia please see history of present illness.  Again patient is not complaining of any jaw pain any shortness of breath dysphagia sore throat no headache dizziness  Chest pain   no recent seizures to my knowledge  Immunization History  Administered Date(s) Administered  . PPD Test 04/16/2015, 05/02/2015   Pertinent  Health Maintenance Due  Topic Date Due  . PNA vac Low Risk Adult (1 of  2 - PCV13) 12/04/2015 (Originally 01/24/1994)  . DEXA SCAN  05/23/2016 (Originally 01/24/1994)  . INFLUENZA VACCINE  08/14/2015   No flowsheet data found. Functional Status Survey:    Filed Vitals:  05/24/15 1414  BP: 158/72  Pulse: 95  Temp: 98.2 F (36.8 C)  TempSrc: Oral  Resp: 20  Of note manual blood pressure taken today was 106/60 pulse was 80 There is no height or weight on file to calculate BMI. Physical Exam   In general this is a pleasant LE female in no distress sitting comfortably in her wheelchair.  Her skin is warm and dry.  Eyes pupils appear reactive to light sclera and conjunctiva are clear visual acuity appears grossly intact.  Oropharynx is clear mucous membranes moist.  Jaw cannot appreciate any abnormality she is able to open and close without pain or difficulty I could not appreciate any adenopathy there is no tenderness to palpation of her mandibular area when she clenches her jaw.  Chest is clear to auscultation there is no labored breathing.  Heart is regular irregular rate and rhythm without murmur gallop or rub she has what appears to be baseline lower extremity edema bit more on the left versus the right.  Abdomen soft nontender with positive bowel sounds.  Musculoskeletal does have lower extremity weakness moves upper extremities at baseline.  Neurologic is grossly intact her speech is clear no lateralizing findings.  Psych she is oriented to self is pleasant and cooperative.    Labs reviewed:  Recent Labs  04/25/15  NA 140  K 4.4  BUN 12  CREATININE 0.7   No results for input(s): AST, ALT, ALKPHOS, BILITOT, PROT, ALBUMIN in the last 8760 hours.  Recent Labs  04/25/15  WBC 7.5  HGB 10.0*  HCT 30*  PLT 220   No results found for: TSH No results found for: HGBA1C No results found for: CHOL, HDL, LDLCALC, LDLDIRECT, TRIG, CHOLHDL  Significant Diagnostic Results in last 30 days:  No results found.  Assessment/Plan  #1 jaw  pain-at this point will monitor exam was quite benign she is no longer complaining of jaw pain she does have when necessary Tylenol monitor for any changes.  #2 history of dementia with behaviors this appears to be relatively stable appears to be doing well with supportive care she is on Aricept as well as Seroquel will check a hemoglobin A1c secondary to high-risk med.  #3 history of atrial fibrillation this appears rate controlled on metoprolol and Cardizem-somewhat variable blood pressures IC 106/60 158/73 121/84 at this point will monitor blood pressure wise.  She is on Eliquest for anticoagulation.  #4 hypertension-as noted above this appears stable on Lopressor as well as diltiazem.  #5 history seizure disorder this appears stable as well no recent seizures to my knowledge she is on Dilantin Will update level.  #6 history hyperlipidemia she is on a statin will update a lipid panel as well as liver function tests.  #7 history of dementia she is on Cymbalta this is coexistent with dementia as noted above.  #8 history of acute renal failure Will update a metabolic panel this appears to have stabilized with creatinine 0.7 BUN of 12 on lab done on April 12.  History of CVA again she is on rEliquist for anticoagulation.  Of note pharmacy has left a note about possibly discontinuing Megace with patient's history of DVT in the past-feel this is a reasonable request since Megace can increase the likelihood of a DVT we will discontinue the Megace-she appears to be eating and drinking fairly well now it this will have to be watched as well.  [id number] note greater than 40 minutes spent assessing patient-discussing her status with nursing  staff-reviewing her chart-her labs-and coordinating and formulating a plan of care for numerous diagnoses-of note greater than 50% of time spent coordinating plan of care        London Sheer, [place] [phone]

## 2015-05-27 DIAGNOSIS — R6884 Jaw pain: Secondary | ICD-10-CM | POA: Insufficient documentation

## 2015-05-30 LAB — HEPATIC FUNCTION PANEL
ALT: 10 U/L (ref 7–35)
AST: 18 U/L (ref 13–35)
Alkaline Phosphatase: 75 U/L (ref 25–125)
BILIRUBIN, TOTAL: 0.3 mg/dL

## 2015-05-30 LAB — CBC AND DIFFERENTIAL
HEMATOCRIT: 32 % — AB (ref 36–46)
HEMOGLOBIN: 10.5 g/dL — AB (ref 12.0–16.0)
PLATELETS: 236 10*3/uL (ref 150–399)
WBC: 5.6 10*3/mL

## 2015-05-30 LAB — BASIC METABOLIC PANEL
BUN: 15 mg/dL (ref 4–21)
CREATININE: 1 mg/dL (ref 0.5–1.1)
Glucose: 97 mg/dL
POTASSIUM: 4.6 mmol/L (ref 3.4–5.3)
SODIUM: 140 mmol/L (ref 137–147)

## 2015-05-30 LAB — LIPID PANEL
Cholesterol: 113 mg/dL (ref 0–200)
HDL: 43 mg/dL (ref 35–70)
LDL Cholesterol: 57 mg/dL
TRIGLYCERIDES: 63 mg/dL (ref 40–160)

## 2015-05-31 ENCOUNTER — Non-Acute Institutional Stay (SKILLED_NURSING_FACILITY): Payer: Medicare Other | Admitting: Internal Medicine

## 2015-05-31 DIAGNOSIS — I714 Abdominal aortic aneurysm, without rupture, unspecified: Secondary | ICD-10-CM | POA: Insufficient documentation

## 2015-05-31 DIAGNOSIS — I4891 Unspecified atrial fibrillation: Secondary | ICD-10-CM

## 2015-05-31 DIAGNOSIS — R112 Nausea with vomiting, unspecified: Secondary | ICD-10-CM

## 2015-05-31 NOTE — Progress Notes (Signed)
Patient ID: Brandi Heath, female   DOB: November 19, 1927, 82 y.o.   MRN: [id number]  Location:  [facility]   Place of Service:  SNF [phone]) Provider:  Edmon Crape  No primary care provider on file.  No care team member to display  Extended Emergency Contact Information Primary Emergency Contact: Tillison,Tammy Address: [address] [place], [place] [address] [place] of [place] Phone: [phone] Relation: Daughter Secondary Emergency Contact: Gale Journey [place] Mobile Phone: [phone] Relation: Daughter   Goals of care: Advanced Directive information Advanced Directives 05/24/2015  Does patient have an advance directive? Yes  Does patient want to make changes to advanced directive? No - Patient declined  Copy of advanced directive(s) in chart? Yes     Chief Complaint  Patient presents with  . Acute Visit  Secondary to patient having recurring vomiting-says she just does not feel right--tachycardia with history of A. fib HPI:  Pt is a 82 y.o. female seen today for an acute visit for recurrent vomiting.  Patient apparently started vomiting this afternoon after returning to the facility from a doctor's visit-when I assessed her she was noted to be significantly tachycardic with pulse between 130 and 140 at times-she is on Cardizem and Lopressor for atrial fibrillation her rate has been controlled largely previously.  Blood pressure did show stability-she did not report initially really any pain or discomfort or shortness of breath.  Reevaluation she did complain of feeling some indigestion and possibly some discomfort in her left arm.  Per serial exams pulse rate continued to be elevated ranging from lower 100s up to the 130s-blood pressure remained fairly stable-O2 saturation was in the 90s on room air.  However patient continued to lookill says she just did not feel right  She does have a history of dementia and CVA  as well as seizure disorder as well as atrial fibrillation.  She does have a previous history of DVT-she is onEliquist for anticoagulation-had recently been on Megace for appetite stimulation but this was recently discontinued secondary to patient apparently eating and drinking better and concern for DVT risk             Past Medical History  Diagnosis Date  . OSA (obstructive sleep apnea)   . Dementia without behavioral disturbance   . Seizures (HCC)   . Hyperlipidemia   . Hypertension   . Clotting disorder (HCC)   . Stroke (HCC)   . DVT (deep venous thrombosis) (HCC)   . Polyneuropathy [facility])    Past Surgical History  Procedure Laterality Date  . Abdominal hysterectomy    . Cholecystectomy    . Abdominal aortic aneurysm repair    . Spinal fusion  2003    Allergies  Allergen Reactions  . Penicillins       Medication List       This list is accurate as of: 05/31/15 11:59 PM.  Always use your most recent med list.               acetaminophen 650 MG CR tablet  Commonly known as:  TYLENOL  Take 650 mg by mouth every 4 (four) hours as needed for pain.     calcium-vitamin D 500-200 MG-UNIT tablet  Take 1 tablet by mouth 2 (two) times daily.     DILANTIN 100 MG ER capsule  Generic drug:  phenytoin  Take 200 mg by mouth 2 (two) times  daily.     diltiazem 360 MG 24 hr capsule  Commonly known as:  TIAZAC  Take 360 mg by mouth daily.     docusate sodium 100 MG capsule  Commonly known as:  COLACE  Take 100 mg by mouth daily.     donepezil 10 MG tablet  Commonly known as:  ARICEPT  Take 10 mg by mouth at bedtime.     DULoxetine 60 MG capsule  Commonly known as:  CYMBALTA  Take 60 mg by mouth at bedtime.     ELIQUIS 2.5 MG Tabs tablet  Generic drug:  apixaban  Take 2.5 mg by mouth 2 (two) times daily.     ENSURE ENLIVE PO  Take by mouth. Give daily to support weight status     fexofenadine 180 MG tablet  Commonly known as:  ALLEGRA  Take 180 mg  by mouth daily.     furosemide 20 MG tablet  Commonly known as:  LASIX  Take 20 mg by mouth.     FUSION PLUS Caps  Take 1 capsule by mouth daily.     Magnesium Oxide -Mg Supplement 400 MG Caps  Take 1 tablet by mouth daily.     megestrol 400 MG/10ML suspension  Commonly known as:  MEGACE  Take 400 mg by mouth daily.     metoprolol 50 MG tablet  Commonly known as:  LOPRESSOR  Take 50 mg by mouth 2 (two) times daily.     mirabegron ER 25 MG Tb24 tablet  Commonly known as:  MYRBETRIQ  Take 25 mg by mouth daily. For over active bladder     multivitamin with minerals tablet  Take 1 tablet by mouth daily.     NON FORMULARY  Magic cup daily for dinner     omeprazole 40 MG capsule  Commonly known as:  PRILOSEC  Take 40 mg by mouth 2 (two) times daily.     Potassium Chloride ER 20 MEQ Tbcr  Take 40 mEq by mouth daily.     promethazine 25 MG tablet  Commonly known as:  PHENERGAN  Take 1 tablet by mouth every 6 hours x 3 doses for nausea and vomiting     QUEtiapine 25 MG tablet  Commonly known as:  SEROQUEL  Take 25 mg by mouth at bedtime.     simvastatin 20 MG tablet  Commonly known as:  ZOCOR  Take 20 mg by mouth daily.     travoprost (benzalkonium) 0.004 % ophthalmic solution  Commonly known as:  TRAVATAN  Place 1 drop into both eyes at bedtime.     vitamin B-12 1000 MCG tablet  Commonly known as:  CYANOCOBALAMIN  Take 1,000 mcg by mouth daily.      Of note her Megace has been discontinued as noted above         Review of Systems--this is limited by dementia please see history of present illness.  She is not complaining of fever chills does not specifically complain of chest pain or abdominal pain although she reports feeling indigestion and possibly some discomfort in her left arm at times-history somewhat varies since patient is a poor historian She did complain of some nausea before her vomiting episodes-does not complain of shortness of  breath.  e  Immunization History  Administered Date(s) Administered  . PPD Test 04/16/2015, 05/02/2015   Pertinent  Health Maintenance Due  Topic Date Due  . PNA vac Low Risk Adult (1 of 2 - PCV13) 12/04/2015 (Originally 01/24/1994)  .  DEXA SCAN  05/23/2016 (Originally 01/24/1994)  . INFLUENZA VACCINE  08/14/2015   No flowsheet data found. Functional Status Survey:    Filed Vitals:   06/03/15 1615  BP: 120/80  Pulse: 120  Resp: 18    Physical Exam   In general this is a pleasant elderly female who appears to be ill although not in acute distress she does appear quite weak fatigued and tired has had several vomiting episodes per serial exams.  Her skin is warm and dry she is not overtly diaphoretic.  Eyes visual acuity appears grossly intact pupils are reactive to light.  Chest is clear to auscultation there is no labored breathing area shallow air entry.  Heart is irregular irregular rate and rhythm up to 140 beats a minute at time this is not persistent per serial exams range from slightly over 100 up to the 130s she has 1-2+ lower extremity edema bilaterally.  Abdomen is soft does not appear to be acutely tender to palpation at times she will complain of slight discomfort when palpated on the right although per serial exams this does not appear to be a consistent finding.  Musculoskeletal is able to move all extremities 4 I do not note any deformities.  Neurologic appears grossly intact grip strength is intact cranial nerves appear grossly intact her speech is clear although she does have some confusion.  Psych again findings consistent with dementia she is pleasant does converse but just feels that she is ill and that certainly fits the presentation I see.           Labs reviewed:  05/29/2015.  WBC 5.6 hemoglobin 10.5 platelets 236.  Sodium 140 potassium 4.6 BUN 15 creatinine 1.0.  Liver function tests within normal limits except albumin of  3.4.  Dilantin level was 15.0  Recent Labs  04/25/15  NA 140  K 4.4  BUN 12  CREATININE 0.7   No results for input(s): AST, ALT, ALKPHOS, BILITOT, PROT, ALBUMIN in the last 8760 hours.  Recent Labs  04/25/15  WBC 7.5  HGB 10.0*  HCT 30*  PLT 220   No results found for: TSH No results found for: HGBA1C No results found for: CHOL, HDL, LDLCALC, LDLDIRECT, TRIG, CHOLHDL  Significant Diagnostic Results in last 30 days:  No results found.  Assessment/Plan   #1 recurrent vomiting with significant tachycardia and ill presentation-concern here with her significant tachycardia in light of atrial fibrillation already on diltiazem and Lopressor-this is complicated with her vomiting episodes which may be conttibuting being somewhat to the tachycardia-however she looks quite ill---will send her to the ER for expedient evaluation secondary to the tachycardia recurrent vomiting her clinical presentation and also some vague complaints of possible indigestion with left arm discomfort.  [id number]        Edmon Crape, PA-C [phone]

## 2015-06-03 ENCOUNTER — Encounter: Payer: Self-pay | Admitting: Internal Medicine

## 2015-06-03 DIAGNOSIS — R112 Nausea with vomiting, unspecified: Secondary | ICD-10-CM | POA: Insufficient documentation

## 2015-06-05 LAB — HEPATIC FUNCTION PANEL
ALK PHOS: 80 U/L (ref 25–125)
ALT: 17 U/L (ref 7–35)
AST: 21 U/L (ref 13–35)
BILIRUBIN, TOTAL: 0.4 mg/dL

## 2015-06-05 LAB — BASIC METABOLIC PANEL
BUN: 10 mg/dL (ref 4–21)
Creatinine: 0.8 mg/dL (ref 0.5–1.1)
Glucose: 118 mg/dL
Potassium: 3.4 mmol/L (ref 3.4–5.3)
Sodium: 142 mmol/L (ref 137–147)

## 2015-06-05 LAB — CBC AND DIFFERENTIAL
HEMATOCRIT: 30 % — AB (ref 36–46)
Hemoglobin: 10.2 g/dL — AB (ref 12.0–16.0)
PLATELETS: 169 10*3/uL (ref 150–399)
WBC: 9 10^3/mL

## 2015-06-06 LAB — BASIC METABOLIC PANEL
BUN: 14 mg/dL (ref 4–21)
Creatinine: 1 mg/dL (ref 0.5–1.1)
Glucose: 114 mg/dL
POTASSIUM: 3.8 mmol/L (ref 3.4–5.3)
Sodium: 144 mmol/L (ref 137–147)

## 2015-06-07 LAB — BASIC METABOLIC PANEL
BUN: 14 mg/dL (ref 4–21)
Creatinine: 1 mg/dL (ref 0.5–1.1)
GLUCOSE: 115 mg/dL
POTASSIUM: 3.9 mmol/L (ref 3.4–5.3)
SODIUM: 138 mmol/L (ref 137–147)

## 2015-06-08 ENCOUNTER — Non-Acute Institutional Stay (SKILLED_NURSING_FACILITY): Payer: Medicare Other | Admitting: Internal Medicine

## 2015-06-08 DIAGNOSIS — I509 Heart failure, unspecified: Secondary | ICD-10-CM

## 2015-06-08 DIAGNOSIS — G4733 Obstructive sleep apnea (adult) (pediatric): Secondary | ICD-10-CM

## 2015-06-08 DIAGNOSIS — R569 Unspecified convulsions: Secondary | ICD-10-CM | POA: Diagnosis not present

## 2015-06-08 DIAGNOSIS — E785 Hyperlipidemia, unspecified: Secondary | ICD-10-CM

## 2015-06-08 DIAGNOSIS — I4891 Unspecified atrial fibrillation: Secondary | ICD-10-CM

## 2015-06-08 DIAGNOSIS — Z86718 Personal history of other venous thrombosis and embolism: Secondary | ICD-10-CM | POA: Diagnosis not present

## 2015-06-08 DIAGNOSIS — I110 Hypertensive heart disease with heart failure: Secondary | ICD-10-CM | POA: Diagnosis not present

## 2015-06-08 DIAGNOSIS — K560 Paralytic ileus: Secondary | ICD-10-CM | POA: Diagnosis not present

## 2015-06-08 NOTE — Progress Notes (Signed)
MRN: [id number] Name: Brandi Heath  Sex: female Age: 82 y.o. DOB: [birth year]  [facility] #: [facility] Facility/Room:423 Level Of Care: SNF Provider: Merrilee Seashore D Emergency Contacts: Extended Emergency Contact Information Primary Emergency Contact: Tillison,Tammy Address: [address]          [place], [place] [address] [place] of [place] Phone: [phone] Relation: Daughter Secondary Emergency Contact: Gale Journey [place] of [place] Mobile Phone: [phone] Relation: Daughter  Code Status:   Allergies: Penicillins  Chief Complaint  Patient presents with  . New Admit To SNF    HPI: Patient is 82 y.o. female who was admitted to [facility] from 5/18-25 for AF with RVR and and adynamic ileus, both resolved. Py is admitted to SNF with generalized weakness. While at [facility] pt will be followed for seizures, tx with dilantin, HTN, tx with metoprolol and cardizem and HLD, tx with zocor.  Past Medical History  Diagnosis Date  . OSA (obstructive sleep apnea)   . Dementia without behavioral disturbance   . Seizures (HCC)   . Hyperlipidemia   . Hypertension   . Clotting disorder (HCC)   . Stroke (HCC)   . DVT (deep venous thrombosis) (HCC)   . Polyneuropathy [facility])     Past Surgical History  Procedure Laterality Date  . Abdominal hysterectomy    . Cholecystectomy    . Abdominal aortic aneurysm repair    . Spinal fusion  2003      Medication List       This list is accurate as of: 06/08/15 11:59 PM.  Always use your most recent med list.               acetaminophen 650 MG CR tablet  Commonly known as:  TYLENOL  Take 650 mg by mouth every 4 (four) hours as needed for pain.     calcium-vitamin D 500-200 MG-UNIT tablet  Take 1 tablet by mouth 2 (two) times daily.     DILANTIN 100 MG ER capsule  Generic drug:  phenytoin  Take 200 mg by mouth 2 (two) times daily.     diltiazem 360 MG 24 hr capsule  Commonly known as:  TIAZAC  Take 360 mg by mouth daily.      docusate sodium 100 MG capsule  Commonly known as:  COLACE  Take 100 mg by mouth daily.     donepezil 10 MG tablet  Commonly known as:  ARICEPT  Take 10 mg by mouth at bedtime.     DULoxetine 60 MG capsule  Commonly known as:  CYMBALTA  Take 60 mg by mouth at bedtime.     ELIQUIS 2.5 MG Tabs tablet  Generic drug:  apixaban  Take 2.5 mg by mouth 2 (two) times daily.     ENSURE ENLIVE PO  Take by mouth. Give daily to support weight status     fexofenadine 180 MG tablet  Commonly known as:  ALLEGRA  Take 180 mg by mouth daily.     furosemide 20 MG tablet  Commonly known as:  LASIX  Take 20 mg by mouth.     FUSION PLUS Caps  Take 1 capsule by mouth daily.     Magnesium Oxide -Mg Supplement 400 MG Caps  Take 1 tablet by mouth daily.     megestrol 400 MG/10ML suspension  Commonly known as:  MEGACE  Take 400 mg by mouth daily.     metoprolol 50 MG tablet  Commonly known as:  LOPRESSOR  Take  50 mg by mouth 2 (two) times daily.     mirabegron ER 25 MG Tb24 tablet  Commonly known as:  MYRBETRIQ  Take 25 mg by mouth daily. For over active bladder     multivitamin with minerals tablet  Take 1 tablet by mouth daily.     NON FORMULARY  Magic cup daily for dinner     omeprazole 40 MG capsule  Commonly known as:  PRILOSEC  Take 40 mg by mouth 2 (two) times daily.     Potassium Chloride ER 20 MEQ Tbcr  Take 40 mEq by mouth daily.     promethazine 25 MG tablet  Commonly known as:  PHENERGAN  Take 1 tablet by mouth every 6 hours x 3 doses for nausea and vomiting     QUEtiapine 25 MG tablet  Commonly known as:  SEROQUEL  Take 25 mg by mouth at bedtime.     simvastatin 20 MG tablet  Commonly known as:  ZOCOR  Take 20 mg by mouth daily.     travoprost (benzalkonium) 0.004 % ophthalmic solution  Commonly known as:  TRAVATAN  Place 1 drop into both eyes at bedtime.     vitamin B-12 1000 MCG tablet  Commonly known as:  CYANOCOBALAMIN  Take 1,000 mcg by mouth  daily.        No orders of the defined types were placed in this encounter.    Immunization History  Administered Date(s) Administered  . PPD Test 04/16/2015, 05/02/2015    Social History  Substance Use Topics  . Smoking status: Never Smoker   . Smokeless tobacco: Not on file  . Alcohol Use: No    Family history is   Family History  Problem Relation Age of Onset  . Hypertension Mother   . Heart disease Mother   . Heart disease Father   . Cancer Sister     breast  . Diabetes Sister   . Heart disease Son   . Hypertension Son       Review of Systems  DATA OBTAINED: from patient, nurse,daughter GENERAL:  no fevers, fatigue, appetite changes SKIN: No itching, rash or wounds EYES: No eye pain, redness, discharge EARS: No earache, tinnitus, change in hearing NOSE: No congestion, drainage or bleeding  MOUTH/THROAT: No mouth or tooth pain, No sore throat RESPIRATORY: No cough, wheezing, SOB CARDIAC: No chest pain, palpitations, lower extremity edema  GI: No abdominal pain, No N/V/D or constipation, No heartburn or reflux  GU: No dysuria, frequency or urgency, or incontinence  MUSCULOSKELETAL: No unrelieved bone/joint pain NEUROLOGIC: No headache, dizziness or focal weakness PSYCHIATRIC: No c/o anxiety or sadness   Filed Vitals:   06/11/15 1852  BP: 118/70  Pulse: 20  Temp: 98.1 F (36.7 C)  Resp: 114    SpO2 Readings from Last 1 Encounters:  No data found for SpO2        Physical Exam  GENERAL APPEARANCE: Alert, conversant,  No acute distress.  SKIN: No diaphoresis rash HEAD: Normocephalic, atraumatic  EYES: Conjunctiva/lids clear. Pupils round, reactive. EOMs intact.  EARS: External exam WNL, canals clear. Hearing grossly normal.  NOSE: No deformity or discharge.  MOUTH/THROAT: Lips w/o lesions  RESPIRATORY: Breathing is even, unlabored. Lung sounds are clear   CARDIOVASCULAR: Heart RRR 2/6 systolic murmur, no  rubs or gallops. No peripheral  edema.   GASTROINTESTINAL: Abdomen is soft, non-tender, not distended w/ normal bowel sounds. GENITOURINARY: Bladder non tender, not distended  MUSCULOSKELETAL: No abnormal joints or musculature  NEUROLOGIC:  Cranial nerves 2-12 grossly intact. Moves all extremities  PSYCHIATRIC: Mood and affect appropriate to situation, no behavioral issues  Patient Active Problem List   Diagnosis Date Noted  . Adynamic ileus (HCC) 06/11/2015  . CHF, acute on chronic (HCC) 06/11/2015  . Personal history of DVT (deep vein thrombosis) 06/11/2015  . Nausea with vomiting 06/03/2015  . Jaw pain 05/27/2015  . E. coli UTI 04/22/2015  . Prerenal acute renal failure (HCC) 04/22/2015  . History of CVA (cerebrovascular accident) 04/22/2015  . AF (atrial fibrillation) (HCC) 04/22/2015  . Polyneuropathy (HCC) 04/22/2015  . Edema 01/25/2015  . Atrial fibrillation with RVR (HCC) 01/20/2015  . Acute encephalopathy 01/20/2015  . UTI (urinary tract infection) 01/20/2015  . Tracheobronchomalacia 01/20/2015  . Personal history of PE (pulmonary embolism) 01/20/2015  . Depression 01/20/2015  . OSA (obstructive sleep apnea)   . Dementia with behavioral disturbance   . Seizures (HCC)   . Hyperlipidemia   . Hypertensive heart disease with CHF (congestive heart failure) (HCC)   . Clotting disorder (HCC)   . Cerebrovascular accident (CVA) due to thrombosis (HCC)        Component Value Date/Time   WBC 7.5 04/25/2015   HGB 10.0* 04/25/2015   HCT 30* 04/25/2015   PLT 220 04/25/2015        Component Value Date/Time   NA 140 04/25/2015   K 4.4 04/25/2015   BUN 12 04/25/2015   CREATININE 0.7 04/25/2015    No results found for: HGBA1C  No results found for: CHOL, HDL, LDLCALC, LDLDIRECT, TRIG, CHOLHDL   Patient was never admitted.  Not all labs, radiology exams or other studies done during hospitalization come through on my EPIC note; however they are reviewed by me.    Assessment and Plan  Adynamic  ileus (HCC) SNF - resolved with NG tube; had heme + stool but GIdeclines endoscopic evaluation  Atrial fibrillation with RVR (HCC) SNF - occ rate in 110's; seen by cards;rec current doses of meds - metoprolol 50 mg BID and diltiazem 360 daily; prophylaxis with eliquis  CHF, acute on chronic (HCC) SNF - resolved with IV lasix which was switched to po on d/c  OSA (obstructive sleep apnea) SNF - refuses Cpap and O2  Seizures (HCC) No reported seizures;cont dilantin 200 mg BID  Personal history of DVT (deep vein thrombosis) Doppler neg for DVT; eliquis for a fib  Hypertensive heart disease with CHF (congestive heart failure) (HCC) SNF - bp controlled by metoprolol 50 mg BID and diltiazem 360 mg and lasix 20 mg  Hyperlipidemia SNF - notstated as uncontrolled;cont zocor 20 mg daily   Time spent > 45 min;> 50% of time with patient was spent reviewing records, labs, tests and studies, counseling and developing plan of care  Margit Hanks, MD

## 2015-06-11 ENCOUNTER — Encounter: Payer: Self-pay | Admitting: Internal Medicine

## 2015-06-11 DIAGNOSIS — I509 Heart failure, unspecified: Secondary | ICD-10-CM | POA: Insufficient documentation

## 2015-06-11 DIAGNOSIS — Z86718 Personal history of other venous thrombosis and embolism: Secondary | ICD-10-CM | POA: Insufficient documentation

## 2015-06-11 DIAGNOSIS — K560 Paralytic ileus: Secondary | ICD-10-CM | POA: Insufficient documentation

## 2015-06-11 NOTE — Assessment & Plan Note (Signed)
SNF - bp controlled by metoprolol 50 mg BID and diltiazem 360 mg and lasix 20 mg

## 2015-06-11 NOTE — Assessment & Plan Note (Signed)
SNF - resolved with IV lasix which was switched to po on d/c

## 2015-06-11 NOTE — Assessment & Plan Note (Signed)
SNF - resolved with NG tube; had heme + stool but GIdeclines endoscopic evaluation

## 2015-06-11 NOTE — Assessment & Plan Note (Signed)
No reported seizures;cont dilantin 200 mg BID

## 2015-06-11 NOTE — Assessment & Plan Note (Signed)
SNF - occ rate in 110's; seen by cards;rec current doses of meds - metoprolol 50 mg BID and diltiazem 360 daily; prophylaxis with eliquis

## 2015-06-11 NOTE — Assessment & Plan Note (Signed)
SNF - notstated as uncontrolled;cont zocor 20 mg daily

## 2015-06-11 NOTE — Assessment & Plan Note (Signed)
Doppler neg for DVT; eliquis for a fib

## 2015-06-11 NOTE — Assessment & Plan Note (Signed)
SNF - refuses Cpap and O2

## 2015-07-25 ENCOUNTER — Encounter: Payer: Self-pay | Admitting: Internal Medicine

## 2015-07-25 ENCOUNTER — Non-Acute Institutional Stay (SKILLED_NURSING_FACILITY): Payer: Medicare Other | Admitting: Internal Medicine

## 2015-07-25 DIAGNOSIS — F03918 Unspecified dementia, unspecified severity, with other behavioral disturbance: Secondary | ICD-10-CM

## 2015-07-25 DIAGNOSIS — Z8673 Personal history of transient ischemic attack (TIA), and cerebral infarction without residual deficits: Secondary | ICD-10-CM | POA: Diagnosis not present

## 2015-07-25 DIAGNOSIS — I110 Hypertensive heart disease with heart failure: Secondary | ICD-10-CM | POA: Diagnosis not present

## 2015-07-25 DIAGNOSIS — F0391 Unspecified dementia with behavioral disturbance: Secondary | ICD-10-CM

## 2015-07-25 NOTE — Progress Notes (Signed)
MRN: [id number] Name: Brandi Heath  Sex: female Age: 82 y.o. DOB: 02/19/1927  PSC #:  Facility/Room: [facility] / [address] Level Of Care: SNF Provider: Randon Goldsmith. Lyn Hollingshead, MD Emergency Contacts: Extended Emergency Contact Information Primary Emergency Contact: Tillison,Tammy Address: [address]          [place], [place] [address] [place] of [place] Phone: [phone] Relation: Daughter Secondary Emergency Contact: Gale Journey [place] of [place] Mobile Phone: [phone] Relation: Daughter  Code Status: DNR  Allergies: Penicillins  Chief Complaint  Patient presents with  . Medical Management of Chronic Issues    Routine Visit    HPI: Patient is 82 y.o. female who is being seen for routine issues of dementa with behavoirs, HTN and h/o CVA.  Past Medical History  Diagnosis Date  . OSA (obstructive sleep apnea)   . Dementia without behavioral disturbance   . Seizures (HCC)   . Hyperlipidemia   . Hypertension   . Clotting disorder (HCC)   . Stroke (HCC)   . DVT (deep venous thrombosis) (HCC)   . Polyneuropathy [facility])     Past Surgical History  Procedure Laterality Date  . Abdominal hysterectomy    . Cholecystectomy    . Abdominal aortic aneurysm repair    . Spinal fusion  2003      Medication List       This list is accurate as of: 07/25/15  4:14 PM.  Always use your most recent med list.               calcium-vitamin D 500-200 MG-UNIT tablet  Take 1 tablet by mouth 2 (two) times daily.     DILANTIN 100 MG ER capsule  Generic drug:  phenytoin  Take 200 mg by mouth 2 (two) times daily.     diltiazem 360 MG 24 hr capsule  Commonly known as:  TIAZAC  Take 360 mg by mouth daily.     docusate sodium 100 MG capsule  Commonly known as:  COLACE  Take 100 mg by mouth daily.     donepezil 10 MG tablet  Commonly known as:  ARICEPT  Take 10 mg by mouth at bedtime.     DULoxetine 60 MG capsule  Commonly known as:  CYMBALTA  Take 60 mg by  mouth at bedtime.     ELIQUIS 2.5 MG Tabs tablet  Generic drug:  apixaban  Take 2.5 mg by mouth 2 (two) times daily.     ENSURE ENLIVE PO  Take 1 Can by mouth daily before breakfast.     fexofenadine 180 MG tablet  Commonly known as:  ALLEGRA  Take 180 mg by mouth daily.     furosemide 20 MG tablet  Commonly known as:  LASIX  Take 20 mg by mouth.     FUSION PLUS Caps  Take 1 capsule by mouth daily.     GUAIFENESIN DM PO  Take 10 mLs by mouth every 4 (four) hours as needed.     loperamide 2 MG capsule  Commonly known as:  IMODIUM  Take 4 mg by mouth 4 (four) times daily as needed for diarrhea or loose stools.     Magnesium Oxide -Mg Supplement 400 MG Caps  Take 1 tablet by mouth daily.     metoprolol 100 MG tablet  Commonly known as:  LOPRESSOR  Take 100 mg by mouth 2 (two) times daily.     mirabegron ER 25 MG Tb24 tablet  Commonly known as:  MYRBETRIQ  Take 25 mg by mouth daily. For over active bladder     multivitamin with minerals tablet  Take 1 tablet by mouth daily.     omeprazole 40 MG capsule  Commonly known as:  PRILOSEC  Take 40 mg by mouth 2 (two) times daily.     polyethylene glycol packet  Commonly known as:  MIRALAX / GLYCOLAX  Take 17 g by mouth daily. Hold for more than 3 bms in a day     Potassium Chloride ER 20 MEQ Tbcr  Take 40 mEq by mouth daily.     promethazine 25 MG tablet  Commonly known as:  PHENERGAN  Take 1 tablet by mouth every 6 hours x 3 doses for nausea and vomiting     QUEtiapine 50 MG tablet  Commonly known as:  SEROQUEL  Take 50 mg by mouth at bedtime.     simvastatin 20 MG tablet  Commonly known as:  ZOCOR  Take 20 mg by mouth daily at 6 PM.     travoprost (benzalkonium) 0.004 % ophthalmic solution  Commonly known as:  TRAVATAN  Place 1 drop into both eyes at bedtime.     TYLENOL 325 MG tablet  Generic drug:  acetaminophen  Take 650 mg by mouth every 4 (four) hours as needed. pain     vitamin B-12 1000 MCG tablet   Commonly known as:  CYANOCOBALAMIN  Take 1,000 mcg by mouth daily.        Meds ordered this encounter  Medications  . polyethylene glycol (MIRALAX / GLYCOLAX) packet    Sig: Take 17 g by mouth daily. Hold for more than 3 bms in a day  . QUEtiapine (SEROQUEL) 50 MG tablet    Sig: Take 50 mg by mouth at bedtime.  Marland Kitchen loperamide (IMODIUM) 2 MG capsule    Sig: Take 4 mg by mouth 4 (four) times daily as needed for diarrhea or loose stools.  Marland Kitchen acetaminophen (TYLENOL) 325 MG tablet    Sig: Take 650 mg by mouth every 4 (four) hours as needed. pain  . Dextromethorphan-Guaifenesin (GUAIFENESIN DM PO)    Sig: Take 10 mLs by mouth every 4 (four) hours as needed.  . Nutritional Supplements (ENSURE ENLIVE PO)    Sig: Take 1 Can by mouth daily before breakfast.  . metoprolol (LOPRESSOR) 100 MG tablet    Sig: Take 100 mg by mouth 2 (two) times daily.    Immunization History  Administered Date(s) Administered  . PPD Test 04/16/2015, 05/02/2015    Social History  Substance Use Topics  . Smoking status: Never Smoker   . Smokeless tobacco: Not on file  . Alcohol Use: No    Review of Systems  UTO 2/2 dementia; nursing with concerns of behavoirs this week    Filed Vitals:   07/25/15 1537  BP: 143/81  Pulse: 114  Temp: 96.8 F (36 C)  Resp: 18    Physical Exam  GENERAL APPEARANCE: Alert, No acute distress  SKIN: No diaphoresis rash HEENT: Unremarkable RESPIRATORY: Breathing is even, unlabored. Lung sounds are clear   CARDIOVASCULAR: Heart RRR no murmurs, rubs or gallops. No peripheral edema  GASTROINTESTINAL: Abdomen is soft, non-tender, not distended w/ normal bowel sounds.  GENITOURINARY: Bladder non tender, not distended  MUSCULOSKELETAL: No abnormal joints or musculature NEUROLOGIC: Cranial nerves 2-12 grossly intact. Moves all extremities PSYCHIATRIC: dementia, some resistance to assist, no behavioral issues  Patient Active Problem List   Diagnosis Date Noted  . Adynamic  ileus (HCC) 06/11/2015  . CHF, acute on chronic (HCC) 06/11/2015  . Personal history of DVT (deep vein thrombosis) 06/11/2015  . Nausea with vomiting 06/03/2015  . Jaw pain 05/27/2015  . E. coli UTI 04/22/2015  . Prerenal acute renal failure (HCC) 04/22/2015  . History of CVA (cerebrovascular accident) 04/22/2015  . AF (atrial fibrillation) (HCC) 04/22/2015  . Polyneuropathy (HCC) 04/22/2015  . Edema 01/25/2015  . Atrial fibrillation with RVR (HCC) 01/20/2015  . Acute encephalopathy 01/20/2015  . UTI (urinary tract infection) 01/20/2015  . Tracheobronchomalacia 01/20/2015  . Personal history of PE (pulmonary embolism) 01/20/2015  . Depression 01/20/2015  . OSA (obstructive sleep apnea)   . Dementia with behavioral disturbance   . Seizures (HCC)   . Hyperlipidemia   . Hypertensive heart disease with CHF (congestive heart failure) (HCC)   . Clotting disorder (HCC)   . Cerebrovascular accident (CVA) due to thrombosis (HCC)     CBC    Component Value Date/Time   WBC 5.6 05/30/2015   HGB 10.5* 05/30/2015   HCT 32* 05/30/2015   PLT 236 05/30/2015    CMP     Component Value Date/Time   NA 140 05/30/2015   K 4.6 05/30/2015   BUN 15 05/30/2015   CREATININE 1.0 05/30/2015   AST 18 05/30/2015   ALT 10 05/30/2015   ALKPHOS 75 05/30/2015    Assessment and Plan  Dementia with behavioral disturbance SNF - cont aricept for dementi and seroquel for behavoirs  Hypertension SNF - controlled with dlitiazema and metoprolol; plan - cont both  History of CVA (cerebrovascular accident) SNF - stable on eliquis   Taisha Pennebaker D. Lyn Hollingshead, MD

## 2015-08-03 LAB — BASIC METABOLIC PANEL
BUN: 16 mg/dL (ref 4–21)
Creatinine: 1.3 mg/dL — AB (ref 0.5–1.1)
GLUCOSE: 173 mg/dL
POTASSIUM: 4.1 mmol/L (ref 3.4–5.3)
Sodium: 138 mmol/L (ref 137–147)

## 2015-08-05 ENCOUNTER — Encounter: Payer: Self-pay | Admitting: Internal Medicine

## 2015-08-22 ENCOUNTER — Non-Acute Institutional Stay (SKILLED_NURSING_FACILITY): Payer: Medicare Other | Admitting: Internal Medicine

## 2015-08-22 ENCOUNTER — Encounter: Payer: Self-pay | Admitting: Internal Medicine

## 2015-08-22 DIAGNOSIS — K219 Gastro-esophageal reflux disease without esophagitis: Secondary | ICD-10-CM

## 2015-08-22 DIAGNOSIS — G4733 Obstructive sleep apnea (adult) (pediatric): Secondary | ICD-10-CM | POA: Diagnosis not present

## 2015-08-22 DIAGNOSIS — E785 Hyperlipidemia, unspecified: Secondary | ICD-10-CM

## 2015-08-22 NOTE — Progress Notes (Signed)
MRN: [id number] Name: Brandi Heath  Sex: female Age: 82 y.o. DOB: 1927/04/19  PSC #:  Facility/Room: [facility] / [address] Level Of Care: SNF Provider: Randon Goldsmith. Lyn Hollingshead, MD Emergency Contacts: Extended Emergency Contact Information Primary Emergency Contact: Tillison,Tammy Address: [address]          [place], [place] [address] [place] of [place] Phone: [phone] Relation: Daughter Secondary Emergency Contact: Gale Journey [place] Mobile Phone: [phone] Relation: Daughter  Code Status:  DNR  Allergies: Penicillins  Chief Complaint  Patient presents with  . Medical Management of Chronic Issues    Routine Visit    HPI: Patient is 82 y.o. female who is being seen for routine issues of HLD, GERD and OSA.  Past Medical History:  Diagnosis Date  . Clotting disorder (HCC)   . Dementia without behavioral disturbance   . DVT (deep venous thrombosis) (HCC)   . Hyperlipidemia   . Hypertension   . OSA (obstructive sleep apnea)   . Polyneuropathy (HCC)   . Seizures (HCC)   . Stroke [facility])     Past Surgical History:  Procedure Laterality Date  . ABDOMINAL AORTIC ANEURYSM REPAIR    . ABDOMINAL HYSTERECTOMY    . CHOLECYSTECTOMY    . SPINAL FUSION  2003      Medication List       Accurate as of 08/22/15 11:59 PM. Always use your most recent med list.          calcium-vitamin D 500-200 MG-UNIT tablet Take 1 tablet by mouth 2 (two) times daily.   DILANTIN 100 MG ER capsule Generic drug:  phenytoin Take 200 mg by mouth 2 (two) times daily.   diltiazem 360 MG 24 hr capsule Commonly known as:  TIAZAC Take 360 mg by mouth daily.   docusate sodium 100 MG capsule Commonly known as:  COLACE Take 100 mg by mouth daily.   donepezil 10 MG tablet Commonly known as:  ARICEPT Take 10 mg by mouth at bedtime.   DULoxetine 60 MG capsule Commonly known as:  CYMBALTA Take 60 mg by mouth at bedtime.   ELIQUIS 2.5 MG Tabs tablet Generic drug:   apixaban Take 2.5 mg by mouth 2 (two) times daily.   ENSURE ENLIVE PO Take 1 Can by mouth daily before breakfast.   fexofenadine 180 MG tablet Commonly known as:  ALLEGRA Take 180 mg by mouth daily.   furosemide 20 MG tablet Commonly known as:  LASIX Take 20 mg by mouth daily.   FUSION PLUS Caps Take 1 capsule by mouth daily.   GUAIFENESIN DM PO Take 10 mLs by mouth every 4 (four) hours as needed.   loperamide 2 MG capsule Commonly known as:  IMODIUM Take 4 mg by mouth 4 (four) times daily as needed for diarrhea or loose stools.   Magnesium Oxide -Mg Supplement 400 MG Caps Take 1 tablet by mouth daily.   metoprolol 100 MG tablet Commonly known as:  LOPRESSOR Take 100 mg by mouth 2 (two) times daily.   mirabegron ER 25 MG Tb24 tablet Commonly known as:  MYRBETRIQ Take 25 mg by mouth daily. For over active bladder   multivitamin with minerals tablet Take 1 tablet by mouth daily.   omeprazole 40 MG capsule Commonly known as:  PRILOSEC Take 40 mg by mouth 2 (two) times daily.   polyethylene glycol packet Commonly known as:  MIRALAX / GLYCOLAX Take 17 g by mouth daily. Hold for more than 3  bms in a day   promethazine 25 MG tablet Commonly known as:  PHENERGAN Take 1 tablet by mouth every 6 hours  for nausea and vomiting   risperiDONE 0.25 MG tablet Commonly known as:  RISPERDAL Take 0.25 mg by mouth at bedtime.   simvastatin 20 MG tablet Commonly known as:  ZOCOR Take 20 mg by mouth daily at 6 PM.   travoprost (benzalkonium) 0.004 % ophthalmic solution Commonly known as:  TRAVATAN Place 1 drop into both eyes at bedtime.   TYLENOL 325 MG tablet Generic drug:  acetaminophen Take 650 mg by mouth every 4 (four) hours as needed. pain   vitamin B-12 1000 MCG tablet Commonly known as:  CYANOCOBALAMIN Take 1,000 mcg by mouth daily.       Meds ordered this encounter  Medications  . risperiDONE (RISPERDAL) 0.25 MG tablet    Sig: Take 0.25 mg by mouth at  bedtime.    Immunization History  Administered Date(s) Administered  . PPD Test 04/16/2015, 05/02/2015    Social History  Substance Use Topics  . Smoking status: Never Smoker  . Smokeless tobacco: Not on file  . Alcohol use No    Review of Systems  DATA OBTAINED: from patient, nurse- per nurse reported with behavoirs but none with me today GENERAL:  no fevers, fatigue, appetite changes SKIN: No itching, rash HEENT: No complaint RESPIRATORY: No cough, wheezing, SOB CARDIAC: No chest pain, palpitations, lower extremity edema  GI: No abdominal pain, No N/V/D or constipation, No heartburn or reflux  GU: No dysuria, frequency or urgency, or incontinence  MUSCULOSKELETAL: No unrelieved bone/joint pain NEUROLOGIC: No headache, dizziness  PSYCHIATRIC: No overt anxiety or sadness  Vitals:   08/22/15 1017  BP: 98/64  Pulse: 69  Resp: 20  Temp: 97.7 F (36.5 C)    Physical Exam  GENERAL APPEARANCE: Alert, conversant, No acute distress  SKIN: No diaphoresis rash HEENT: Unremarkable RESPIRATORY: Breathing is even, unlabored. Lung sounds are clear   CARDIOVASCULAR: Heart RRR no murmurs, rubs or gallops. No peripheral edema  GASTROINTESTINAL: Abdomen is soft, non-tender, not distended w/ normal bowel sounds.  GENITOURINARY: Bladder non tender, not distended  MUSCULOSKELETAL: No abnormal joints or musculature NEUROLOGIC: Cranial nerves 2-12 grossly intact. Moves all extremities PSYCHIATRIC: Mood and affect a little over bearing with dementia, no behavioral issues today, but many reported  Patient Active Problem List   Diagnosis Date Noted  . Enterococcus UTI 09/02/2015  . GERD (gastroesophageal reflux disease) 09/02/2015  . Adynamic ileus (HCC) 06/11/2015  . CHF, acute on chronic (HCC) 06/11/2015  . Personal history of DVT (deep vein thrombosis) 06/11/2015  . Nausea with vomiting 06/03/2015  . Jaw pain 05/27/2015  . E. coli UTI 04/22/2015  . Prerenal acute renal failure  (HCC) 04/22/2015  . History of CVA (cerebrovascular accident) 04/22/2015  . AF (atrial fibrillation) (HCC) 04/22/2015  . Polyneuropathy (HCC) 04/22/2015  . Edema 01/25/2015  . Atrial fibrillation with RVR (HCC) 01/20/2015  . Acute encephalopathy 01/20/2015  . UTI (urinary tract infection) 01/20/2015  . Tracheobronchomalacia 01/20/2015  . Personal history of PE (pulmonary embolism) 01/20/2015  . Depression 01/20/2015  . OSA (obstructive sleep apnea)   . Dementia with behavioral disturbance   . Seizures (HCC)   . Hyperlipidemia   . Hypertensive heart disease with CHF (congestive heart failure) (HCC)   . Clotting disorder (HCC)   . Cerebrovascular accident (CVA) due to thrombosis (HCC)     CBC    Component Value Date/Time   WBC  9.0 06/05/2015   HGB 10.2 (A) 06/05/2015   HCT 30 (A) 06/05/2015   PLT 169 06/05/2015    CMP     Component Value Date/Time   NA 138 08/03/2015   K 4.1 08/03/2015   BUN 16 08/03/2015   CREATININE 1.3 (A) 08/03/2015   AST 21 06/05/2015   ALT 17 06/05/2015   ALKPHOS 80 06/05/2015    Assessment and Plan  Hyperlipidemia Chronic and ongoing; LDL 57, HDL 45 in 05/2015 with nl LFT's; cont zocor 20 mg daily  GERD (gastroesophageal reflux disease) Chronic and stable without symptoms; cont protonix 40 mg BID  OSA (obstructive sleep apnea) Chronic and stable, doesn't tolerate CPAP and doesn't want O2 either;fortunately pt 's O2 is 99% RA; will cont to monitor   Geetika Laborde D. Lyn Hollingshead, MD

## 2015-08-28 LAB — HEPATIC FUNCTION PANEL
ALK PHOS: 153 U/L — AB (ref 25–125)
ALT: 28 U/L (ref 7–35)
AST: 29 U/L (ref 13–35)
Bilirubin, Total: 0.7 mg/dL

## 2015-08-28 LAB — CBC AND DIFFERENTIAL
HCT: 38 % (ref 36–46)
HEMOGLOBIN: 13 g/dL (ref 12.0–16.0)
PLATELETS: 192 10*3/uL (ref 150–399)
WBC: 6.6 10*3/mL

## 2015-08-28 LAB — BASIC METABOLIC PANEL
BUN: 18 mg/dL (ref 4–21)
Creatinine: 1.1 mg/dL (ref 0.5–1.1)
Glucose: 152 mg/dL
POTASSIUM: 4.1 mmol/L (ref 3.4–5.3)
Sodium: 145 mmol/L (ref 137–147)

## 2015-08-31 ENCOUNTER — Non-Acute Institutional Stay (SKILLED_NURSING_FACILITY): Payer: Medicare Other | Admitting: Internal Medicine

## 2015-08-31 DIAGNOSIS — B952 Enterococcus as the cause of diseases classified elsewhere: Secondary | ICD-10-CM

## 2015-08-31 DIAGNOSIS — N390 Urinary tract infection, site not specified: Secondary | ICD-10-CM | POA: Diagnosis not present

## 2015-09-02 ENCOUNTER — Encounter: Payer: Self-pay | Admitting: Internal Medicine

## 2015-09-02 DIAGNOSIS — B952 Enterococcus as the cause of diseases classified elsewhere: Secondary | ICD-10-CM | POA: Insufficient documentation

## 2015-09-02 DIAGNOSIS — N390 Urinary tract infection, site not specified: Principal | ICD-10-CM

## 2015-09-02 DIAGNOSIS — K219 Gastro-esophageal reflux disease without esophagitis: Secondary | ICD-10-CM | POA: Insufficient documentation

## 2015-09-02 NOTE — Assessment & Plan Note (Signed)
Chronic and stable without symptoms; cont protonix 40 mg BID

## 2015-09-02 NOTE — Progress Notes (Signed)
MRN: [id number] Name: Brandi Heath  Sex: female Age: 82 y.o. DOB: 03-29-1927  PSC #:  Facility[facility]Room:[facility] Level Of Care: SNF Provider: Merrilee Seashore D Emergency Contacts: Extended Emergency Contact Information Primary Emergency Contact: Tillison,Tammy Address: [address] [place], [place] [address] Darden [place] of [place] Phone: [phone] Relation: Daughter Secondary Emergency Contact: Gale Journey [place] Mobile Phone: [phone] Relation: Daughter  Code Status:   Allergies: Penicillins  Chief Complaint  Patient presents with  . Acute Visit    HPI: Patient is 82 y.o. female who I s being seen acutely for a UTI ordered for MS changes. Pt is surprised and unhappy that she has a UTI again. hER MENTAL STATUS SEEMS REASONABLE TODAY. NURSING REPORTS NO FEVER.  Past Medical History:  Diagnosis Date  . Clotting disorder (HCC)   . Dementia without behavioral disturbance   . DVT (deep venous thrombosis) (HCC)   . Hyperlipidemia   . Hypertension   . OSA (obstructive sleep apnea)   . Polyneuropathy (HCC)   . Seizures (HCC)   . Stroke [facility])     Past Surgical History:  Procedure Laterality Date  . ABDOMINAL AORTIC ANEURYSM REPAIR    . ABDOMINAL HYSTERECTOMY    . CHOLECYSTECTOMY    . SPINAL FUSION  2003      Medication List       Accurate as of 08/31/15 11:59 PM. Always use your most recent med list.          calcium-vitamin D 500-200 MG-UNIT tablet Take 1 tablet by mouth 2 (two) times daily.   DILANTIN 100 MG ER capsule Generic drug:  phenytoin Take 200 mg by mouth 2 (two) times daily.   diltiazem 360 MG 24 hr capsule Commonly known as:  TIAZAC Take 360 mg by mouth daily.   docusate sodium 100 MG capsule Commonly known as:  COLACE Take 100 mg by mouth daily.   donepezil 10 MG tablet Commonly known as:  ARICEPT Take 10 mg by mouth at bedtime.   DULoxetine 60 MG capsule Commonly known as:  CYMBALTA Take 60 mg  by mouth at bedtime.   ELIQUIS 2.5 MG Tabs tablet Generic drug:  apixaban Take 2.5 mg by mouth 2 (two) times daily.   ENSURE ENLIVE PO Take 1 Can by mouth daily before breakfast.   fexofenadine 180 MG tablet Commonly known as:  ALLEGRA Take 180 mg by mouth daily.   furosemide 20 MG tablet Commonly known as:  LASIX Take 20 mg by mouth daily.   FUSION PLUS Caps Take 1 capsule by mouth daily.   GUAIFENESIN DM PO Take 10 mLs by mouth every 4 (four) hours as needed.   loperamide 2 MG capsule Commonly known as:  IMODIUM Take 4 mg by mouth 4 (four) times daily as needed for diarrhea or loose stools.   Magnesium Oxide -Mg Supplement 400 MG Caps Take 1 tablet by mouth daily.   metoprolol 100 MG tablet Commonly known as:  LOPRESSOR Take 100 mg by mouth 2 (two) times daily.   mirabegron ER 25 MG Tb24 tablet Commonly known as:  MYRBETRIQ Take 25 mg by mouth daily. For over active bladder   multivitamin with minerals tablet Take 1 tablet by mouth daily.   omeprazole 40 MG capsule Commonly known as:  PRILOSEC Take 40 mg by mouth 2 (two) times daily.   polyethylene glycol packet Commonly known as:  MIRALAX / GLYCOLAX Take 17 g  by mouth daily. Hold for more than 3 bms in a day   promethazine 25 MG tablet Commonly known as:  PHENERGAN Take 1 tablet by mouth every 6 hours  for nausea and vomiting   risperiDONE 0.25 MG tablet Commonly known as:  RISPERDAL Take 0.25 mg by mouth at bedtime.   simvastatin 20 MG tablet Commonly known as:  ZOCOR Take 20 mg by mouth daily at 6 PM.   travoprost (benzalkonium) 0.004 % ophthalmic solution Commonly known as:  TRAVATAN Place 1 drop into both eyes at bedtime.   TYLENOL 325 MG tablet Generic drug:  acetaminophen Take 650 mg by mouth every 4 (four) hours as needed. pain   vitamin B-12 1000 MCG tablet Commonly known as:  CYANOCOBALAMIN Take 1,000 mcg by mouth daily.       No orders of the defined types were placed in this  encounter.   Immunization History  Administered Date(s) Administered  . PPD Test 04/16/2015, 05/02/2015    Social History  Substance Use Topics  . Smoking status: Never Smoker  . Smokeless tobacco: Not on file  . Alcohol use No    Review of Systems  DATA OBTAINED: from patient, nurse GENERAL:  no fevers, fatigue, appetite changes SKIN: No itching, rash HEENT: No complaint RESPIRATORY: No cough, wheezing, SOB CARDIAC: No chest pain, palpitations, lower extremity edema  GI: No abdominal pain, No N/V/D or constipation, No heartburn or reflux  GU: No dysuria, frequency or urgency, or incontinence  MUSCULOSKELETAL: No unrelieved bone/joint pain NEUROLOGIC: No headache, dizziness  PSYCHIATRIC: No overt anxiety or sadness  Vitals:   09/02/15 1320  BP: 98/64  Pulse: 90  Resp: 20  Temp: 97.7 F (36.5 C)    Physical Exam  GENERAL APPEARANCE: Alert, conversant, No acute distress  SKIN: No diaphoresis rash HEENT: Unremarkable RESPIRATORY: Breathing is even, unlabored. Lung sounds are clear   CARDIOVASCULAR: Heart RRR 2/6 systolic  Murmur, no rubs or gallops. No peripheral edema  GASTROINTESTINAL: Abdomen is soft, non-tender, not distended w/ normal bowel sounds.  GENITOURINARY: Bladder non tender, not distended  MUSCULOSKELETAL: No abnormal joints or musculature NEUROLOGIC: Cranial nerves 2-12 grossly intact. Moves all extremities PSYCHIATRIC: Mood and affect appropriate to situation, no behavioral issues  Patient Active Problem List   Diagnosis Date Noted  . Enterococcus UTI 09/02/2015  . Adynamic ileus (HCC) 06/11/2015  . CHF, acute on chronic (HCC) 06/11/2015  . Personal history of DVT (deep vein thrombosis) 06/11/2015  . Nausea with vomiting 06/03/2015  . Jaw pain 05/27/2015  . E. coli UTI 04/22/2015  . Prerenal acute renal failure (HCC) 04/22/2015  . History of CVA (cerebrovascular accident) 04/22/2015  . AF (atrial fibrillation) (HCC) 04/22/2015  .  Polyneuropathy (HCC) 04/22/2015  . Edema 01/25/2015  . Atrial fibrillation with RVR (HCC) 01/20/2015  . Acute encephalopathy 01/20/2015  . UTI (urinary tract infection) 01/20/2015  . Tracheobronchomalacia 01/20/2015  . Personal history of PE (pulmonary embolism) 01/20/2015  . Depression 01/20/2015  . OSA (obstructive sleep apnea)   . Dementia with behavioral disturbance   . Seizures (HCC)   . Hyperlipidemia   . Hypertensive heart disease with CHF (congestive heart failure) (HCC)   . Clotting disorder (HCC)   . Cerebrovascular accident (CVA) due to thrombosis (HCC)     CBC    Component Value Date/Time   WBC 9.0 06/05/2015   HGB 10.2 (A) 06/05/2015   HCT 30 (A) 06/05/2015   PLT 169 06/05/2015    CMP  Component Value Date/Time   NA 138 08/03/2015   K 4.1 08/03/2015   BUN 16 08/03/2015   CREATININE 1.3 (A) 08/03/2015   AST 21 06/05/2015   ALT 17 06/05/2015   ALKPHOS 80 06/05/2015    Assessment and Plan  Enterococcus UTI >50,000 -  100,000 Enterococcus cloacae resistant to all except gentamycin, tobramycin  and imipenem. Cr 1.26, CrCl -30.4 making the dose for her Imipenem 300 mg IV q 6 for 7 days; a PICC line placement has been ordered.  Time spent > 25 min;> 50% of time with patient was spent reviewing records, labs, tests and studies, counseling and developing plan of care  Margit HanksALEXANDER, Sabien Umland D, MD

## 2015-09-02 NOTE — Assessment & Plan Note (Signed)
>  50,000 -  100,000 Enterococcus cloacae resistant to all except gentamycin, tobramycin  and imipenem. Cr 1.26, CrCl -30.4 making the dose for her Imipenem 300 mg IV q 6 for 7 days; a PICC line placement has been ordered.

## 2015-09-02 NOTE — Assessment & Plan Note (Signed)
Chronic and stable, doesn't tolerate CPAP and doesn't want O2 either;fortunately pt 's O2 is 99% RA; will cont to monitor

## 2015-09-02 NOTE — Assessment & Plan Note (Signed)
Chronic and ongoing; LDL 57, HDL 45 in 05/2015 with nl LFT's; cont zocor 20 mg daily

## 2015-09-13 ENCOUNTER — Encounter: Payer: Self-pay | Admitting: Internal Medicine

## 2015-09-13 LAB — CBC AND DIFFERENTIAL
HEMATOCRIT: 28 % — AB (ref 36–46)
Hemoglobin: 9.2 g/dL — AB (ref 12.0–16.0)
Platelets: 170 10*3/uL (ref 150–399)
WBC: 4.3 10^3/mL

## 2015-09-13 LAB — HEMOGLOBIN A1C: HEMOGLOBIN A1C: 6.1

## 2015-09-13 NOTE — Progress Notes (Signed)
Opened in error; Disregard.

## 2015-09-26 ENCOUNTER — Non-Acute Institutional Stay (SKILLED_NURSING_FACILITY): Payer: Medicare Other | Admitting: Internal Medicine

## 2015-09-26 ENCOUNTER — Encounter: Payer: Self-pay | Admitting: Internal Medicine

## 2015-09-26 DIAGNOSIS — E538 Deficiency of other specified B group vitamins: Secondary | ICD-10-CM | POA: Diagnosis not present

## 2015-09-26 DIAGNOSIS — D638 Anemia in other chronic diseases classified elsewhere: Secondary | ICD-10-CM

## 2015-09-26 DIAGNOSIS — I872 Venous insufficiency (chronic) (peripheral): Secondary | ICD-10-CM | POA: Diagnosis not present

## 2015-09-26 DIAGNOSIS — I509 Heart failure, unspecified: Secondary | ICD-10-CM

## 2015-09-26 NOTE — Progress Notes (Signed)
MRN: [id number] Name: Brandi Heath  Sex: female Age: 82 y.o. DOB: 1927-05-26  PSC #:  [facility] [address] Level Of Care: SNF Provider: Randon GoldsmithAnne D. Lyn HollingsheadAlexander, MD Emergency Contacts: Extended Emergency Contact Information Primary Emergency Contact: Tillison,Tammy Address: [address]          [place], [place] [address] [place] Mobile Phone: [phone] Relation: Daughter Secondary Emergency Contact: Gale JourneyBivens,Billy  [place] Mobile Phone: [phone] Relation: Daughter  Code Status: DNR  Allergies: Penicillins  Chief Complaint  Patient presents with  . Medical Management of Chronic Issues    Routine Visit    HPI: Patient is 82 y.o. female who is being seen for routine issues of CHF/chronic lymphedema, B12 deficiency and anemia.  Past Medical History:  Diagnosis Date  . Clotting disorder (HCC)   . Dementia without behavioral disturbance   . DVT (deep venous thrombosis) (HCC)   . Hyperlipidemia   . Hypertension   . OSA (obstructive sleep apnea)   . Polyneuropathy (HCC)   . Seizures (HCC)   . Stroke [facility](HCC)     Past Surgical History:  Procedure Laterality Date  . ABDOMINAL AORTIC ANEURYSM REPAIR    . ABDOMINAL HYSTERECTOMY    . CHOLECYSTECTOMY    . SPINAL FUSION  2003      Medication List       Accurate as of 09/26/15 11:59 PM. Always use your most recent med list.          calcium-vitamin D 500-200 MG-UNIT tablet Take 1 tablet by mouth daily.   DILANTIN 100 MG ER capsule Generic drug:  phenytoin Take 200 mg by mouth 2 (two) times daily.   diltiazem 360 MG 24 hr capsule Commonly known as:  TIAZAC Take 360 mg by mouth daily.   docusate sodium 100 MG capsule Commonly known as:  COLACE Take 100 mg by mouth daily.   donepezil 10 MG tablet Commonly known as:  ARICEPT Take 10 mg by mouth at bedtime.   DULoxetine 60 MG capsule Commonly known as:  CYMBALTA Take 60 mg by mouth at bedtime.   ELIQUIS 2.5 MG Tabs  tablet Generic drug:  apixaban Take 2.5 mg by mouth 2 (two) times daily.   ENSURE ENLIVE PO Take 1 Can by mouth daily before breakfast.   fexofenadine 180 MG tablet Commonly known as:  ALLEGRA Take 180 mg by mouth daily.   furosemide 20 MG tablet Commonly known as:  LASIX Take 20 mg by mouth daily.   FUSION PLUS Caps Take 1 capsule by mouth daily.   GUAIFENESIN DM PO Take 10 mLs by mouth every 4 (four) hours as needed.   loperamide 2 MG capsule Commonly known as:  IMODIUM Take 4 mg by mouth 4 (four) times daily as needed for diarrhea or loose stools.   Magnesium Oxide -Mg Supplement 400 MG Caps Take 1 tablet by mouth daily.   metoCLOPramide 10 MG tablet Commonly known as:  REGLAN Take 10 mg by mouth 2 (two) times daily.   Metoprolol Tartrate 75 MG Tabs Take 75 mg by mouth daily. Hold for SBP < 90   mirabegron ER 25 MG Tb24 tablet Commonly known as:  MYRBETRIQ Take 25 mg by mouth daily. For over active bladder   multivitamin with minerals tablet Take 1 tablet by mouth daily.   omeprazole 40 MG capsule Commonly known as:  PRILOSEC Take 40 mg by mouth 2 (two) times daily.   PHENERGAN 25 MG suppository Generic  drug:  promethazine Place 25 mg rectally every 12 (twelve) hours as needed for nausea or vomiting.   polyethylene glycol packet Commonly known as:  MIRALAX / GLYCOLAX Take 17 g by mouth daily. Hold for more than 3 bms in a day   RISPERDAL 1 MG tablet Generic drug:  risperiDONE Take 1 mg by mouth 2 (two) times daily.   simvastatin 20 MG tablet Commonly known as:  ZOCOR Take 20 mg by mouth daily at 6 PM.   travoprost (benzalkonium) 0.004 % ophthalmic solution Commonly known as:  TRAVATAN Place 1 drop into both eyes at bedtime.   TYLENOL 325 MG tablet Generic drug:  acetaminophen Take 650 mg by mouth every 4 (four) hours as needed. pain   UTI-STAT PO Take 30 mLs by mouth 2 (two) times daily.   vitamin B-12 1000 MCG tablet Commonly known as:   CYANOCOBALAMIN Take 1,000 mcg by mouth daily.       No orders of the defined types were placed in this encounter.   Immunization History  Administered Date(s) Administered  . PPD Test 04/16/2015, 05/02/2015    Social History  Substance Use Topics  . Smoking status: Never Smoker  . Smokeless tobacco: Not on file  . Alcohol use No    Review of Systems  UTO 2/2 dementia    Vitals:   09/26/15 1105  BP: 108/76  Pulse: 99  Resp: 18  Temp: 97.6 F (36.4 C)    Physical Exam  GENERAL APPEARANCE: Alert, non conversant, No acute distress  SKIN: No diaphoresis rash HEENT: Unremarkable RESPIRATORY: Breathing is even, unlabored. Lung sounds are clear   CARDIOVASCULAR: Heart RRR no murmurs, rubs or gallops. 2+ peripheral edema  GASTROINTESTINAL: Abdomen is soft, non-tender, not distended w/ normal bowel sounds.  GENITOURINARY: Bladder non tender, not distended  MUSCULOSKELETAL: No abnormal joints or musculature NEUROLOGIC: Cranial nerves 2-12 grossly intact. Moves all extremities PSYCHIATRIC: dementia, no behavioral issues  Patient Active Problem List   Diagnosis Date Noted  . Chronic CHF (congestive heart failure) (HCC) 09/29/2015  . Venous insufficiency (chronic) (peripheral) 09/29/2015  . Vitamin B12 deficiency 09/29/2015  . Anemia, chronic disease 09/29/2015  . Enterococcus UTI 09/02/2015  . GERD (gastroesophageal reflux disease) 09/02/2015  . Adynamic ileus (HCC) 06/11/2015  . CHF, acute on chronic (HCC) 06/11/2015  . Personal history of DVT (deep vein thrombosis) 06/11/2015  . Nausea with vomiting 06/03/2015  . Jaw pain 05/27/2015  . E. coli UTI 04/22/2015  . Prerenal acute renal failure (HCC) 04/22/2015  . History of CVA (cerebrovascular accident) 04/22/2015  . AF (atrial fibrillation) (HCC) 04/22/2015  . Polyneuropathy (HCC) 04/22/2015  . Edema 01/25/2015  . Atrial fibrillation with RVR (HCC) 01/20/2015  . Acute encephalopathy 01/20/2015  . UTI (urinary  tract infection) 01/20/2015  . Tracheobronchomalacia 01/20/2015  . Personal history of PE (pulmonary embolism) 01/20/2015  . Depression 01/20/2015  . OSA (obstructive sleep apnea)   . Dementia with behavioral disturbance   . Seizures (HCC)   . Hyperlipidemia   . Hypertensive heart disease with CHF (congestive heart failure) (HCC)   . Clotting disorder (HCC)   . Cerebrovascular accident (CVA) due to thrombosis (HCC)     CBC    Component Value Date/Time   WBC 4.3 09/13/2015   HGB 9.2 (A) 09/13/2015   HCT 28 (A) 09/13/2015   PLT 170 09/13/2015    CMP     Component Value Date/Time   NA 145 08/28/2015   K 4.1 08/28/2015   BUN  18 08/28/2015   CREATININE 1.1 08/28/2015   AST 29 08/28/2015   ALT 28 08/28/2015   ALKPHOS 153 (A) 08/28/2015    Assessment and Plan  Chromic venous insufficiency/ chronic CHF - pt with 13# weigh gain over 30 days, 3 # over past 7 days ;plan to inc lasix to 40 mg daily; will d/c TED hose ; thay are both at her ankles,very tight  Vitamin B12 deficiency B12 level 1545 ;havre d/c her vitamin B12 replacement  Anemia, chronic disease Hb 9.2, 1 gram dec ; will obtain hemocult cards; will monitor at intervals   Thurston Hole D. Lyn Hollingshead, MD

## 2015-09-29 ENCOUNTER — Encounter: Payer: Self-pay | Admitting: Internal Medicine

## 2015-09-29 DIAGNOSIS — E538 Deficiency of other specified B group vitamins: Secondary | ICD-10-CM | POA: Insufficient documentation

## 2015-09-29 DIAGNOSIS — D638 Anemia in other chronic diseases classified elsewhere: Secondary | ICD-10-CM | POA: Insufficient documentation

## 2015-09-29 DIAGNOSIS — I872 Venous insufficiency (chronic) (peripheral): Secondary | ICD-10-CM | POA: Insufficient documentation

## 2015-09-29 DIAGNOSIS — I509 Heart failure, unspecified: Secondary | ICD-10-CM | POA: Insufficient documentation

## 2015-09-29 NOTE — Assessment & Plan Note (Signed)
B12 level 1545 ;havre d/c her vitamin B12 replacement

## 2015-09-29 NOTE — Assessment & Plan Note (Signed)
Hb 9.2, 1 gram dec ; will obtain hemocult cards; will monitor at intervals

## 2015-10-01 ENCOUNTER — Emergency Department (HOSPITAL_COMMUNITY): Payer: Medicare Other

## 2015-10-01 ENCOUNTER — Inpatient Hospital Stay (HOSPITAL_COMMUNITY)
Admission: EM | Admit: 2015-10-01 | Discharge: 2015-10-08 | DRG: 388 | Disposition: A | Discharge status: Skilled Nursing Facility | Payer: Medicare Other | Attending: Internal Medicine | Admitting: Internal Medicine

## 2015-10-01 ENCOUNTER — Encounter (HOSPITAL_COMMUNITY): Payer: Self-pay | Admitting: *Deleted

## 2015-10-01 DIAGNOSIS — D6959 Other secondary thrombocytopenia: Secondary | ICD-10-CM | POA: Diagnosis present

## 2015-10-01 DIAGNOSIS — I4891 Unspecified atrial fibrillation: Secondary | ICD-10-CM | POA: Diagnosis present

## 2015-10-01 DIAGNOSIS — I5033 Acute on chronic diastolic (congestive) heart failure: Secondary | ICD-10-CM

## 2015-10-01 DIAGNOSIS — E876 Hypokalemia: Secondary | ICD-10-CM | POA: Diagnosis present

## 2015-10-01 DIAGNOSIS — K5669 Other intestinal obstruction: Secondary | ICD-10-CM | POA: Diagnosis not present

## 2015-10-01 DIAGNOSIS — E785 Hyperlipidemia, unspecified: Secondary | ICD-10-CM | POA: Diagnosis present

## 2015-10-01 DIAGNOSIS — J9601 Acute respiratory failure with hypoxia: Secondary | ICD-10-CM | POA: Diagnosis present

## 2015-10-01 DIAGNOSIS — R200 Anesthesia of skin: Secondary | ICD-10-CM | POA: Diagnosis not present

## 2015-10-01 DIAGNOSIS — K565 Intestinal adhesions [bands] with obstruction (postprocedural) (postinfection): Principal | ICD-10-CM | POA: Diagnosis present

## 2015-10-01 DIAGNOSIS — Z0189 Encounter for other specified special examinations: Secondary | ICD-10-CM

## 2015-10-01 DIAGNOSIS — Z8673 Personal history of transient ischemic attack (TIA), and cerebral infarction without residual deficits: Secondary | ICD-10-CM | POA: Diagnosis not present

## 2015-10-01 DIAGNOSIS — I509 Heart failure, unspecified: Secondary | ICD-10-CM

## 2015-10-01 DIAGNOSIS — G4733 Obstructive sleep apnea (adult) (pediatric): Secondary | ICD-10-CM | POA: Diagnosis present

## 2015-10-01 DIAGNOSIS — D638 Anemia in other chronic diseases classified elsewhere: Secondary | ICD-10-CM | POA: Diagnosis present

## 2015-10-01 DIAGNOSIS — Z993 Dependence on wheelchair: Secondary | ICD-10-CM

## 2015-10-01 DIAGNOSIS — Z86711 Personal history of pulmonary embolism: Secondary | ICD-10-CM | POA: Diagnosis present

## 2015-10-01 DIAGNOSIS — I340 Nonrheumatic mitral (valve) insufficiency: Secondary | ICD-10-CM | POA: Diagnosis present

## 2015-10-01 DIAGNOSIS — Z880 Allergy status to penicillin: Secondary | ICD-10-CM

## 2015-10-01 DIAGNOSIS — D696 Thrombocytopenia, unspecified: Secondary | ICD-10-CM

## 2015-10-01 DIAGNOSIS — Z86718 Personal history of other venous thrombosis and embolism: Secondary | ICD-10-CM

## 2015-10-01 DIAGNOSIS — R0989 Other specified symptoms and signs involving the circulatory and respiratory systems: Secondary | ICD-10-CM

## 2015-10-01 DIAGNOSIS — G459 Transient cerebral ischemic attack, unspecified: Secondary | ICD-10-CM

## 2015-10-01 DIAGNOSIS — Z981 Arthrodesis status: Secondary | ICD-10-CM

## 2015-10-01 DIAGNOSIS — F0391 Unspecified dementia with behavioral disturbance: Secondary | ICD-10-CM | POA: Diagnosis present

## 2015-10-01 DIAGNOSIS — I2510 Atherosclerotic heart disease of native coronary artery without angina pectoris: Secondary | ICD-10-CM | POA: Diagnosis present

## 2015-10-01 DIAGNOSIS — I130 Hypertensive heart and chronic kidney disease with heart failure and stage 1 through stage 4 chronic kidney disease, or unspecified chronic kidney disease: Secondary | ICD-10-CM | POA: Diagnosis present

## 2015-10-01 DIAGNOSIS — G40909 Epilepsy, unspecified, not intractable, without status epilepticus: Secondary | ICD-10-CM | POA: Diagnosis present

## 2015-10-01 DIAGNOSIS — Z66 Do not resuscitate: Secondary | ICD-10-CM | POA: Diagnosis present

## 2015-10-01 DIAGNOSIS — F03918 Unspecified dementia, unspecified severity, with other behavioral disturbance: Secondary | ICD-10-CM | POA: Diagnosis present

## 2015-10-01 DIAGNOSIS — Z7901 Long term (current) use of anticoagulants: Secondary | ICD-10-CM

## 2015-10-01 DIAGNOSIS — R4781 Slurred speech: Secondary | ICD-10-CM | POA: Diagnosis not present

## 2015-10-01 DIAGNOSIS — I639 Cerebral infarction, unspecified: Secondary | ICD-10-CM

## 2015-10-01 DIAGNOSIS — R059 Cough, unspecified: Secondary | ICD-10-CM

## 2015-10-01 DIAGNOSIS — K56609 Unspecified intestinal obstruction, unspecified as to partial versus complete obstruction: Secondary | ICD-10-CM | POA: Diagnosis present

## 2015-10-01 DIAGNOSIS — N179 Acute kidney failure, unspecified: Secondary | ICD-10-CM | POA: Diagnosis present

## 2015-10-01 DIAGNOSIS — D649 Anemia, unspecified: Secondary | ICD-10-CM

## 2015-10-01 DIAGNOSIS — Z8679 Personal history of other diseases of the circulatory system: Secondary | ICD-10-CM

## 2015-10-01 DIAGNOSIS — I5032 Chronic diastolic (congestive) heart failure: Secondary | ICD-10-CM | POA: Diagnosis not present

## 2015-10-01 DIAGNOSIS — I482 Chronic atrial fibrillation: Secondary | ICD-10-CM | POA: Diagnosis present

## 2015-10-01 DIAGNOSIS — Z833 Family history of diabetes mellitus: Secondary | ICD-10-CM

## 2015-10-01 DIAGNOSIS — Z8249 Family history of ischemic heart disease and other diseases of the circulatory system: Secondary | ICD-10-CM

## 2015-10-01 DIAGNOSIS — R112 Nausea with vomiting, unspecified: Secondary | ICD-10-CM | POA: Diagnosis present

## 2015-10-01 DIAGNOSIS — N183 Chronic kidney disease, stage 3 (moderate): Secondary | ICD-10-CM | POA: Diagnosis present

## 2015-10-01 DIAGNOSIS — Z9049 Acquired absence of other specified parts of digestive tract: Secondary | ICD-10-CM

## 2015-10-01 DIAGNOSIS — Z79899 Other long term (current) drug therapy: Secondary | ICD-10-CM

## 2015-10-01 DIAGNOSIS — R05 Cough: Secondary | ICD-10-CM

## 2015-10-01 HISTORY — DX: Atherosclerotic heart disease of native coronary artery without angina pectoris: I25.10

## 2015-10-01 HISTORY — DX: Unspecified intestinal obstruction, unspecified as to partial versus complete obstruction: K56.609

## 2015-10-01 HISTORY — DX: Heart failure, unspecified: I50.9

## 2015-10-01 HISTORY — DX: Other pulmonary embolism without acute cor pulmonale: I26.99

## 2015-10-01 LAB — CBC WITH DIFFERENTIAL/PLATELET
Basophils Absolute: 0 K/uL (ref 0.0–0.1)
Basophils Relative: 0 %
Eosinophils Absolute: 0 K/uL (ref 0.0–0.7)
Eosinophils Relative: 0 %
HCT: 36 % (ref 36.0–46.0)
Hemoglobin: 12.6 g/dL (ref 12.0–15.0)
Lymphocytes Relative: 12 %
Lymphs Abs: 0.9 K/uL (ref 0.7–4.0)
MCH: 29.6 pg (ref 26.0–34.0)
MCHC: 35 g/dL (ref 30.0–36.0)
MCV: 84.7 fL (ref 78.0–100.0)
Monocytes Absolute: 1.1 K/uL — ABNORMAL HIGH (ref 0.1–1.0)
Monocytes Relative: 14 %
Neutro Abs: 5.7 K/uL (ref 1.7–7.7)
Neutrophils Relative %: 74 %
Platelets: 175 K/uL (ref 150–400)
RBC: 4.25 MIL/uL (ref 3.87–5.11)
RDW: 15.9 % — ABNORMAL HIGH (ref 11.5–15.5)
WBC: 7.7 K/uL (ref 4.0–10.5)

## 2015-10-01 LAB — COMPREHENSIVE METABOLIC PANEL WITH GFR
ALT: 19 U/L (ref 14–54)
AST: 28 U/L (ref 15–41)
Albumin: 3.7 g/dL (ref 3.5–5.0)
Alkaline Phosphatase: 171 U/L — ABNORMAL HIGH (ref 38–126)
Anion gap: 13 (ref 5–15)
BUN: 29 mg/dL — ABNORMAL HIGH (ref 6–20)
CO2: 33 mmol/L — ABNORMAL HIGH (ref 22–32)
Calcium: 8.7 mg/dL — ABNORMAL LOW (ref 8.9–10.3)
Chloride: 99 mmol/L — ABNORMAL LOW (ref 101–111)
Creatinine, Ser: 1.18 mg/dL — ABNORMAL HIGH (ref 0.44–1.00)
GFR calc Af Amer: 47 mL/min — ABNORMAL LOW
GFR calc non Af Amer: 41 mL/min — ABNORMAL LOW
Glucose, Bld: 118 mg/dL — ABNORMAL HIGH (ref 65–99)
Potassium: 3.4 mmol/L — ABNORMAL LOW (ref 3.5–5.1)
Sodium: 145 mmol/L (ref 135–145)
Total Bilirubin: 0.9 mg/dL (ref 0.3–1.2)
Total Protein: 7.2 g/dL (ref 6.5–8.1)

## 2015-10-01 LAB — URINALYSIS, ROUTINE W REFLEX MICROSCOPIC
Glucose, UA: NEGATIVE mg/dL
Hgb urine dipstick: NEGATIVE
Ketones, ur: NEGATIVE mg/dL
Leukocytes, UA: NEGATIVE
Nitrite: NEGATIVE
Protein, ur: NEGATIVE mg/dL
Specific Gravity, Urine: 1.041 — ABNORMAL HIGH (ref 1.005–1.030)
pH: 7.5 (ref 5.0–8.0)

## 2015-10-01 LAB — ABO/RH: ABO/RH(D): O POS

## 2015-10-01 LAB — OCCULT BLOOD GASTRIC / DUODENUM (SPECIMEN CUP)
OCCULT BLOOD, GASTRIC: POSITIVE — AB
PH, GASTRIC: UNDETERMINED

## 2015-10-01 LAB — CBC
HCT: 35.4 % — ABNORMAL LOW (ref 36.0–46.0)
Hemoglobin: 11.9 g/dL — ABNORMAL LOW (ref 12.0–15.0)
MCH: 29.3 pg (ref 26.0–34.0)
MCHC: 33.6 g/dL (ref 30.0–36.0)
MCV: 87.2 fL (ref 78.0–100.0)
PLATELETS: 163 10*3/uL (ref 150–400)
RBC: 4.06 MIL/uL (ref 3.87–5.11)
RDW: 15.5 % (ref 11.5–15.5)
WBC: 7.9 10*3/uL (ref 4.0–10.5)

## 2015-10-01 LAB — LIPASE, BLOOD: Lipase: 34 U/L (ref 11–51)

## 2015-10-01 LAB — TYPE AND SCREEN
ABO/RH(D): O POS
Antibody Screen: NEGATIVE

## 2015-10-01 LAB — PHENYTOIN LEVEL, TOTAL: PHENYTOIN LVL: 21.3 ug/mL — AB (ref 10.0–20.0)

## 2015-10-01 LAB — MRSA PCR SCREENING: MRSA BY PCR: NEGATIVE

## 2015-10-01 MED ORDER — PANTOPRAZOLE SODIUM 40 MG IV SOLR
40.0000 mg | Freq: Two times a day (BID) | INTRAVENOUS | Status: DC
  Administered 2015-10-01: 40 mg via INTRAVENOUS
  Filled 2015-10-01: qty 40

## 2015-10-01 MED ORDER — SODIUM CHLORIDE 0.9 % IV SOLN
80.0000 mg | Freq: Once | INTRAVENOUS | Status: AC
  Administered 2015-10-01: 80 mg via INTRAVENOUS
  Filled 2015-10-01: qty 80

## 2015-10-01 MED ORDER — TRAVOPROST 0.004 % OP SOLN: 1.0000 [drp] | Freq: Every day | OPHTHALMIC | Status: DC

## 2015-10-01 MED ORDER — DILTIAZEM HCL-DEXTROSE 100-5 MG/100ML-% IV SOLN (PREMIX)
5.0000 mg/h | INTRAVENOUS | Status: DC
  Administered 2015-10-01: 10 mg/h via INTRAVENOUS
  Administered 2015-10-01: 15 mg/h via INTRAVENOUS
  Administered 2015-10-01 – 2015-10-02 (×2): 10 mg/h via INTRAVENOUS
  Administered 2015-10-02: 15 mg/h via INTRAVENOUS
  Filled 2015-10-01 (×2): qty 100

## 2015-10-01 MED ORDER — IOPAMIDOL (ISOVUE-300) INJECTION 61%
75.0000 mL | Freq: Once | INTRAVENOUS | Status: AC | PRN
  Administered 2015-10-01: 75 mL via INTRAVENOUS

## 2015-10-01 MED ORDER — SODIUM CHLORIDE 0.9 % IV BOLUS (SEPSIS)
1000.0000 mL | Freq: Once | INTRAVENOUS | Status: AC
  Administered 2015-10-01: 1000 mL via INTRAVENOUS

## 2015-10-01 MED ORDER — DIATRIZOATE MEGLUMINE & SODIUM 66-10 % PO SOLN
90.0000 mL | Freq: Once | ORAL | Status: AC
Start: 2015-10-01 — End: 2015-10-01
  Administered 2015-10-01: 90 mL via NASOGASTRIC
  Filled 2015-10-01: qty 90

## 2015-10-01 MED ORDER — DILTIAZEM HCL-DEXTROSE 100-5 MG/100ML-% IV SOLN (PREMIX)
5.0000 mg/h | INTRAVENOUS | Status: DC
  Administered 2015-10-01: 5 mg/h via INTRAVENOUS
  Filled 2015-10-01: qty 100

## 2015-10-01 MED ORDER — SODIUM CHLORIDE 0.9 % IV SOLN
INTRAVENOUS | Status: DC
  Administered 2015-10-01 – 2015-10-03 (×4): via INTRAVENOUS

## 2015-10-01 MED ORDER — LATANOPROST 0.005 % OP SOLN
1.0000 [drp] | Freq: Every day | OPHTHALMIC | Status: DC
  Administered 2015-10-01 – 2015-10-07 (×6): 1 [drp] via OPHTHALMIC
  Filled 2015-10-01 (×3): qty 2.5

## 2015-10-01 MED ORDER — SODIUM CHLORIDE 0.9% FLUSH
3.0000 mL | Freq: Two times a day (BID) | INTRAVENOUS | Status: DC
  Administered 2015-10-02 – 2015-10-08 (×8): 3 mL via INTRAVENOUS

## 2015-10-01 MED ORDER — SODIUM CHLORIDE 0.9 % IV SOLN
8.0000 mg/h | INTRAVENOUS | Status: DC
  Filled 2015-10-01: qty 80

## 2015-10-01 MED ORDER — ONDANSETRON HCL 4 MG PO TABS: 4.0000 mg | ORAL_TABLET | Freq: Four times a day (QID) | ORAL | Status: DC | PRN

## 2015-10-01 MED ORDER — ACETAMINOPHEN 325 MG PO TABS
650.0000 mg | ORAL_TABLET | Freq: Four times a day (QID) | ORAL | Status: DC | PRN
  Administered 2015-10-03 – 2015-10-04 (×3): 650 mg via ORAL
  Filled 2015-10-01 (×3): qty 2

## 2015-10-01 MED ORDER — ACETAMINOPHEN 650 MG RE SUPP: 650.0000 mg | Freq: Four times a day (QID) | RECTAL | Status: DC | PRN

## 2015-10-01 MED ORDER — DIATRIZOATE MEGLUMINE & SODIUM 66-10 % PO SOLN: 90.0000 mL | Freq: Once | ORAL | Status: DC

## 2015-10-01 MED ORDER — ONDANSETRON HCL 4 MG/2ML IJ SOLN: 4.0000 mg | Freq: Four times a day (QID) | INTRAMUSCULAR | Status: DC | PRN

## 2015-10-01 MED ORDER — DILTIAZEM LOAD VIA INFUSION
15.0000 mg | Freq: Once | INTRAVENOUS | Status: AC
  Administered 2015-10-01: 15 mg via INTRAVENOUS
  Filled 2015-10-01: qty 15

## 2015-10-01 MED ORDER — DILTIAZEM HCL 25 MG/5ML IV SOLN
10.0000 mg | Freq: Once | INTRAVENOUS | Status: AC
  Administered 2015-10-01: 10 mg via INTRAVENOUS
  Filled 2015-10-01: qty 5

## 2015-10-01 NOTE — Progress Notes (Addendum)
MEDICATION RELATED CONSULT NOTE - INITIAL   Pharmacy Consult for Phenytoin Indication: hx of seizures  Allergies  Allergen Reactions  . Penicillins     From [facility]    Patient Measurements: Height: 5\' 7"  (170.2 cm) Weight: 153 lb 7 oz (69.6 kg) IBW/kg (Calculated) : 61.6  Vital Signs: Temp: 97.5 F (36.4 C) (09/18 1850) Temp Source: Axillary (09/18 1850) BP: 122/52 (09/18 1800) Pulse Rate: 63 (09/18 1800) Intake/Output from previous day: No intake/output data recorded. Intake/Output from this shift: No intake/output data recorded.  Labs:  Recent Labs  10/01/15 1040  WBC 7.7  HGB 12.6  HCT 36.0  PLT 175  CREATININE 1.18*  ALBUMIN 3.7  PROT 7.2  AST 28  ALT 19  ALKPHOS 171*  BILITOT 0.9   Estimated Creatinine Clearance: 33.3 mL/min (by C-G formula based on SCr of 1.18 mg/dL (H)).   Microbiology: Recent Results (from the past 720 hour(s))  MRSA PCR Screening     Status: None   Collection Time: 10/01/15  5:14 PM  Result Value Ref Range Status   MRSA by PCR NEGATIVE NEGATIVE Final    Comment:        The GeneXpert MRSA Assay (FDA approved for NASAL specimens only), is one component of a comprehensive MRSA colonization surveillance program. It is not intended to diagnose MRSA infection nor to guide or monitor treatment for MRSA infections.     Medical History: Past Medical History:  Diagnosis Date  . Atrial fibrillation (HCC)   . Clotting disorder (HCC)   . Coronary artery disease   . Dementia without behavioral disturbance   . DVT (deep venous thrombosis) (HCC)   . Hyperlipidemia   . Hypertension   . OSA (obstructive sleep apnea)   . Polyneuropathy (HCC)   . Seizures (HCC)   . Stroke [facility])     Assessment: 42 y/oF with PMH of seizures, stroke, a-fib who presented on 10/01/15 with n/v x 3-4 days, found to have a small bowel obstruction, a-fib with RVR, and heme positive gastric fluid. Pharmacy consulted to transition patient's phenytoin from PO to  IV. PTA dosage reported as Phenytoin ER 100mg  capsule 2 capsules (for total of 200mg ) PO BID. Last dose reported as 09/30/15. No recent seizure activity noted.   Today, 10/01/15:  SCr: 1.18, mildly elevated compared to previous value in August  Albumin: WNL (3.7) Phenytoin level: 21.3 mcg/mL, supratherapeutic  Goal of Therapy:  Phenytoin level 10-20 mcg/mL Absence of seizures  Plan:  Hold phenytoin today due to supratherapeutic level. Recheck phenytoin level, CMET in AM and resume via IV formulation once level falls below 20 mcg/mL.    Greer Pickerel, PharmD, BCPS Pager: [phone] 10/01/2015 8:07 PM

## 2015-10-01 NOTE — ED Triage Notes (Signed)
Pt pulled NG tube out during CT.

## 2015-10-01 NOTE — ED Triage Notes (Signed)
Family requesting hand mitts or soft restraints due to pt attempting to pull tubes and lines out

## 2015-10-01 NOTE — ED Triage Notes (Signed)
Contrast for CT given at 1145 via tube

## 2015-10-01 NOTE — ED Notes (Signed)
DG at bedside. 

## 2015-10-01 NOTE — Consult Note (Signed)
[facility] Surgery Consult Note  Brandi Heath October 16, 1927  [id number].    Requesting MD: Jola Schmidt Chief Complaint/Reason for Consult: Small Bowel Obstruction  HPI:  Brandi Heath is an 82yo female who presented to [facility] earlier today from SNF with a confirmed small bowel obstruction on xray. H/o SBO about 3-4 months ago medically managed at [facility]. Patients daughters are in the room and state that she began complaining of abdominal pain with associated nausea and vomiting last Thursday. State that she received a laxative at SNF but this did not help. Unsure when her last BM was. She is typically awake and alert but today when they visited she was obtunded.    CT scan consistent with mid to distal small bowel obstruction.  PMH significant for Afib RVR (on Eliquis), CHF, h/o stroke, h/o seizures, HTN, HLD, CAD, h/o DVT, dementia Abdominal surgical h/o cholecystectomy, hysterectomy, AAA repair with graft Resides in SNF [facility])  ROS: All systems reviewed and otherwise negative except for as above  Family History  Problem Relation Age of Onset  . Hypertension Mother   . Heart disease Mother   . Heart disease Father   . Cancer Sister     breast  . Diabetes Sister   . Heart disease Son   . Hypertension Son   . Thyroid disease Sister     Past Medical History:  Diagnosis Date  . Atrial fibrillation ([place])   . Clotting disorder ([place])   . Coronary artery disease   . Dementia without behavioral disturbance   . DVT (deep venous thrombosis) ([place])   . Hyperlipidemia   . Hypertension   . OSA (obstructive sleep apnea)   . Polyneuropathy ([place])   . Seizures ([place])   . Stroke [facility])     Past Surgical History:  Procedure Laterality Date  . ABDOMINAL AORTIC ANEURYSM REPAIR    . ABDOMINAL HYSTERECTOMY    . CHOLECYSTECTOMY    . SPINAL FUSION  2003    Social History:  reports that she has never smoked. She has never used smokeless tobacco. She reports that she  does not drink alcohol. Her drug history is not on file.  Allergies:  Allergies  Allergen Reactions  . Penicillins     From [facility]     (Not in a hospital admission)  Blood pressure 130/84, pulse (!) 151, temperature 98.1 F (36.7 C), temperature source Oral, resp. rate 19, SpO2 100 %.  Physical Exam: General: obtunded, WD/WN AA female who is laying in bed in NAD HEENT: head is normocephalic, atraumatic. Heart: irregularly irregular rhythm, tachycardic.  Palpable pedal pulses bilaterally Lungs: CTAB. Respiratory effort nonlabored Abd: well healed vertical incision noted distal to umbilicus. soft, distended. NT, hypoactive bowel sounds Skin: warm and dry with no masses, lesions, or rashes Psych: obtunded   Results for orders placed or performed during the hospital encounter of 10/01/15 (from the past 48 hour(s))  Comprehensive metabolic panel     Status: Abnormal   Collection Time: 10/01/15 10:40 AM  Result Value Ref Range   Sodium 145 135 - 145 mmol/L   Potassium 3.4 (L) 3.5 - 5.1 mmol/L   Chloride 99 (L) 101 - 111 mmol/L   CO2 33 (H) 22 - 32 mmol/L   Glucose, Bld 118 (H) 65 - 99 mg/dL   BUN 29 (H) 6 - 20 mg/dL   Creatinine, Ser 1.18 (H) 0.44 - 1.00 mg/dL   Calcium 8.7 (L) 8.9 - 10.3 mg/dL   Total Protein 7.2  6.5 - 8.1 g/dL   Albumin 3.7 3.5 - 5.0 g/dL   AST 28 15 - 41 U/L   ALT 19 14 - 54 U/L   Alkaline Phosphatase 171 (H) 38 - 126 U/L   Total Bilirubin 0.9 0.3 - 1.2 mg/dL   GFR calc non Af Amer 41 (L) >60 mL/min   GFR calc Af Amer 47 (L) >60 mL/min    Comment: (NOTE) The eGFR has been calculated using the CKD EPI equation. This calculation has not been validated in all clinical situations. eGFR's persistently <60 mL/min signify possible Chronic Kidney Disease.    Anion gap 13 5 - 15  Lipase, blood     Status: None   Collection Time: 10/01/15 10:40 AM  Result Value Ref Range   Lipase 34 11 - 51 U/L  CBC with Differential     Status: Abnormal   Collection Time:  10/01/15 10:40 AM  Result Value Ref Range   WBC 7.7 4.0 - 10.5 K/uL   RBC 4.25 3.87 - 5.11 MIL/uL   Hemoglobin 12.6 12.0 - 15.0 g/dL   HCT 36.0 36.0 - 46.0 %   MCV 84.7 78.0 - 100.0 fL   MCH 29.6 26.0 - 34.0 pg   MCHC 35.0 30.0 - 36.0 g/dL   RDW 15.9 (H) 11.5 - 15.5 %   Platelets 175 150 - 400 K/uL   Neutrophils Relative % 74 %   Neutro Abs 5.7 1.7 - 7.7 K/uL   Lymphocytes Relative 12 %   Lymphs Abs 0.9 0.7 - 4.0 K/uL   Monocytes Relative 14 %   Monocytes Absolute 1.1 (H) 0.1 - 1.0 K/uL   Eosinophils Relative 0 %   Eosinophils Absolute 0.0 0.0 - 0.7 K/uL   Basophils Relative 0 %   Basophils Absolute 0.0 0.0 - 0.1 K/uL  Type and screen [place]     Status: None   Collection Time: 10/01/15 10:40 AM  Result Value Ref Range   ABO/RH(D) O POS    Antibody Screen NEG    Sample Expiration 10/04/2015   ABO/Rh     Status: None   Collection Time: 10/01/15 10:40 AM  Result Value Ref Range   ABO/RH(D) O POS   Occult bld gastric/duodenum (cup to lab)     Status: Abnormal   Collection Time: 10/01/15 11:58 AM  Result Value Ref Range   pH, Gastric INDETERMINATE    Occult Blood, Gastric POSITIVE (A) NEGATIVE   Ct Abdomen Pelvis W Contrast  Result Date: 10/01/2015 CLINICAL DATA:  Nursing home patient with dementia, presenting with fecal emesis. Evaluate for bowel obstruction. EXAM: CT ABDOMEN AND PELVIS WITH CONTRAST TECHNIQUE: Multidetector CT imaging of the abdomen and pelvis was performed using the standard protocol following bolus administration of intravenous contrast. CONTRAST:  44m ISOVUE-300 IOPAMIDOL (ISOVUE-300) INJECTION 61% COMPARISON:  None. FINDINGS: Lower chest: The heart is enlarged. There is atherosclerosis of the coronary arteries. No significant pleural or pericardial effusion. No confluent basilar airspace disease. Hepatobiliary: No focal hepatic abnormalities are identified. There is no significant biliary dilatation status post cholecystectomy.  Pancreas: Diffusely atrophied. No evidence of pancreatic mass, ductal dilatation or surrounding inflammation. Spleen: Normal in size without focal abnormality. Adrenals/Urinary Tract: Both adrenal glands appear normal. Both kidneys demonstrate cortical thinning. There are cysts in the lower pole of the right kidney, measuring up to 3.2 cm in diameter. No evidence of renal mass, urinary tract calculus or hydronephrosis. The bladder appears unremarkable for its degree of distention. Stomach/Bowel:  Small hiatal hernia. The stomach, proximal and mid small bowel are mildly dilated with multiple air-fluid levels. The distal small bowel is decompressed. The transition point is suspected to be in the false pelvis (axial image 56). No surrounding soft tissue mass or fluid collection is apparent in this area. The colon is decompressed. No apparent fistula or focal extraluminal fluid collection. There is a small amount of free pelvic fluid. Vascular/Lymphatic: There are no enlarged abdominal or pelvic lymph nodes. Patient has an aorto bi-iliac stent graft which appears patent. 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 No evidence of endograft leak. Both internal iliac arteries appear thrombosed status post probable embolization on the right. The external iliac arteries are patent. IVC filter noted with mild strut penetration. Reproductive: Hysterectomy.  No evidence of adnexal mass. Other: There is a small amount of ascites. There is generalized edema throughout the mesenteric and subcutaneous fat. No focal extraluminal fluid collections are seen. Musculoskeletal: No acute or significant osseous findings. The bones are demineralized. There is a possible sacral decubitus ulcer. No evidence of bone destruction. Lower lumbar spondylosis noted. IMPRESSION: 1. Findings are consistent with a mid to distal small bowel obstruction. Transition point appears to be in the false pelvis  without discernible cause, suggesting adhesions. 2. No fistula or abscess identified. 3. Generalized soft tissue edema with mild pelvic ascites. 4. Patent aortic stent graft. Electronically Signed   By: Richardean Sale M.D.   On: 10/01/2015 13:50   Dg Chest Portable 1 View  Result Date: 10/01/2015 CLINICAL DATA:  Nasogastric tube placement. Decreased oxygen saturation. EXAM: PORTABLE CHEST 1 VIEW COMPARISON:  None. FINDINGS: Nasogastric tube tip and side port are below the diaphragm. No pneumothorax. There is fine nodular interstitial disease throughout the lungs with a mid to lower lung lung zone predominance. There is no airspace consolidation or volume loss. The heart size and pulmonary vascular normal. No adenopathy. There is atherosclerotic calcification in the aorta. There is degenerative change in each shoulder. IMPRESSION: Nasogastric tube tip and side port below the diaphragm. Fine nodular interstitial disease throughout the lungs of uncertain etiology or chronicity. No frank edema or consolidation. Cardiac silhouette within normal limits. There is aortic atherosclerosis. Electronically Signed   By: Lowella Grip III M.D.   On: 10/01/2015 14:21      Assessment/Plan SBO - CT scan: mid to distal small bowel obstruction likely secondary to adhesions - 2nd SBO in the last 3-4 months, last obstruction managed medically - SBO protocol - NG tube in place AKI - BUN/Cr 29/1.18. Continue IVF. Hypokalemia - mild, 3.4  Afib RVR (on Eliquis) CHF h/o stroke h/o seizures HTN HLD CAD h/o DVT Dementia  Plan - medicine to admit due to multiple medical issues. Recommend SBO protocol. Continue NGT.   Jerrye Beavers, [facility] Surgery 10/01/2015, 3:04 PM Pager: [phone] Consults: [phone] Mon-Fri 7:00 am-4:30 pm Sat-Sun 7:00 am-11:30 am

## 2015-10-01 NOTE — ED Triage Notes (Signed)
EMS reports pt from [facility], confirmed bowel obstruction, fecal emesis at facility, nausea still noted.

## 2015-10-01 NOTE — H&P (Addendum)
[facility] History and Physical  Brandi Heath ZOX:[id number] DOB: 10/24/1927 DOA: 10/01/2015   PCP: Merrilee Seashore, MD  Specialists: None  Chief Complaint: Nausea, vomiting since Friday  HPI: Brandi Heath is a 82 y.o. female with a past medical history of dementia, previous episodes of small bowel obstruction, atrial fibrillation on anticoagulation, history of PE and DVT, history of stroke, history of seizure disorder who was in her usual state of health at her nursing facility, [facility], till this past Thursday and Friday when she started having nausea followed by vomiting. Patient is a very limited historian. She is somewhat somnolent although arousable. She does have a history of dementia. She apparently did okay on Saturday, but then started vomiting again on Sunday. Most of the history was provided by the patient's 2 daughters who are at the bedside. On Sunday evening, the nursing facility, got a x-ray done, which showed evidence for obstruction, according to the daughter. But the NH wanted to observe the patient. However, the patient got worse overnight and so it was decided to get her over to the emergency department today. No history is available from the patient, whatsoever. There has been no history of any abdominal discomfort.   In the emergency department, patient was evaluated, underwent a CT scan which showed small bowel obstruction. NG tube was placed and initially fecal matter was present. Patient did not have any frank hematemesis but the gastric fluid was positive for blood by Hemoccult. Patient will require inpatient hospitalization. In the meantime, her heart rate also went up and she went into atrial fibrillation with RVR.  Home Medications: Prior to Admission medications   Medication Sig Start Date End Date Taking? Authorizing Provider  acetaminophen (TYLENOL) 325 MG tablet Take 650 mg by mouth every 4 (four) hours as needed (Pain). pain    Yes Historical Provider, MD    acetaminophen (TYLENOL) 650 MG suppository Place 650 mg rectally every 6 (six) hours. For 2 days. Started 9/17   Yes Historical Provider, MD  apixaban (ELIQUIS) 2.5 MG TABS tablet Take 2.5 mg by mouth 2 (two) times daily.   Yes Historical Provider, MD  Calcium Carbonate-Vitamin D (CALCIUM-VITAMIN D) 500-200 MG-UNIT tablet Take 1 tablet by mouth daily.    Yes Historical Provider, MD  Dextromethorphan-Guaifenesin (GUAIFENESIN DM PO) Take 10 mLs by mouth every 4 (four) hours as needed (Cough).    Yes Historical Provider, MD  diltiazem (TIAZAC) 360 MG 24 hr capsule Take 360 mg by mouth daily.   Yes Historical Provider, MD  docusate sodium (COLACE) 100 MG capsule Take 100 mg by mouth 2 (two) times daily as needed for mild constipation.    Yes Historical Provider, MD  donepezil (ARICEPT) 10 MG tablet Take 10 mg by mouth at bedtime.    Yes Historical Provider, MD  DULoxetine (CYMBALTA) 60 MG capsule Take 60 mg by mouth at bedtime.    Yes Historical Provider, MD  fexofenadine (ALLEGRA) 180 MG tablet Take 180 mg by mouth daily.    Yes Historical Provider, MD  furosemide (LASIX) 40 MG tablet Take 40 mg by mouth daily.   Yes Historical Provider, MD  Iron-FA-B Cmp-C-Biot-Probiotic (FUSION PLUS) CAPS Take 1 capsule by mouth daily.   Yes Historical Provider, MD  loperamide (IMODIUM) 2 MG capsule Take 4 mg by mouth 4 (four) times daily as needed for diarrhea or loose stools.   Yes Historical Provider, MD  Magnesium Oxide -Mg Supplement 400 MG CAPS Take 1 tablet by mouth daily.  Yes Historical Provider, MD  metoCLOPramide (REGLAN) 10 MG tablet Take 10 mg by mouth 2 (two) times daily.   Yes Historical Provider, MD  Metoprolol Tartrate 75 MG TABS Take 75 mg by mouth daily. Hold for SBP < 90   Yes Historical Provider, MD  mirabegron ER (MYRBETRIQ) 25 MG TB24 tablet Take 25 mg by mouth daily. For over active bladder   Yes Historical Provider, MD  Multiple Vitamins-Minerals (MULTIVITAMIN WITH MINERALS) tablet Take 1  tablet by mouth daily.   Yes Historical Provider, MD  Nutritional Supplements (ENSURE ENLIVE PO) Take 1 Can by mouth daily before breakfast.   Yes Historical Provider, MD  omeprazole (PRILOSEC) 40 MG capsule Take 40 mg by mouth 2 (two) times daily.   Yes Historical Provider, MD  phenytoin (DILANTIN) 100 MG ER capsule Take 200 mg by mouth 2 (two) times daily.   Yes Historical Provider, MD  polyethylene glycol (MIRALAX / GLYCOLAX) packet Take 17 g by mouth daily. Hold for more than 3 bms in a day   Yes Historical Provider, MD  promethazine (PHENERGAN) 25 MG suppository Place 25 mg rectally every 12 (twelve) hours as needed for nausea or vomiting.   Yes Historical Provider, MD  risperiDONE (RISPERDAL) 1 MG tablet Take 1 mg by mouth 2 (two) times daily.   Yes Historical Provider, MD  simvastatin (ZOCOR) 20 MG tablet Take 20 mg by mouth daily at 6 PM.    Yes Historical Provider, MD  travoprost, benzalkonium, (TRAVATAN) 0.004 % ophthalmic solution Place 1 drop into both eyes at bedtime.   Yes Historical Provider, MD  vitamin B-12 (CYANOCOBALAMIN) 1000 MCG tablet Take 1,000 mcg by mouth daily.   Yes Historical Provider, MD    Allergies:  Allergies  Allergen Reactions  . Penicillins     From [facility]    Past Medical History: Past Medical History:  Diagnosis Date  . Atrial fibrillation (HCC)   . Clotting disorder (HCC)   . Coronary artery disease   . Dementia without behavioral disturbance   . DVT (deep venous thrombosis) (HCC)   . Hyperlipidemia   . Hypertension   . OSA (obstructive sleep apnea)   . Polyneuropathy (HCC)   . Seizures (HCC)   . Stroke [facility])     Past Surgical History:  Procedure Laterality Date  . ABDOMINAL AORTIC ANEURYSM REPAIR    . ABDOMINAL HYSTERECTOMY    . CHOLECYSTECTOMY    . SPINAL FUSION  2003    Social History: Patient lives in a nursing facility. She has no history of smoking or alcohol use. She is wheelchair bound.  Family History:  Family History  Problem  Relation Age of Onset  . Hypertension Mother   . Heart disease Mother   . Heart disease Father   . Cancer Sister     breast  . Diabetes Sister   . Heart disease Son   . Hypertension Son   . Thyroid disease Sister      Review of Systems - Unable to do due to her dementia  Physical Examination  Vitals:   10/01/15 1600 10/01/15 1630 10/01/15 1637 10/01/15 1645  BP: (!) 135/105 118/66 118/66   Pulse: (!) 161 70 (!) 51   Resp: 26  22 18   Temp:      TempSrc:      SpO2: 100% 100% 100%     BP 118/66   Pulse (!) 51   Temp 98.1 F (36.7 C) (Oral)   Resp 18   SpO2  100%   General appearance: Somnolent but arousable. In no distress. Head: Normocephalic, without obvious abnormality, atraumatic. NG tube noted. Eyes: conjunctivae/corneas clear. PERRL, EOM's intact.  Throat: lips, mucosa, and tongue normal; teeth and gums normal Neck: no adenopathy, no carotid bruit, no JVD, supple, symmetrical, trachea midline and thyroid not enlarged, symmetric, no tenderness/mass/nodules Resp: Coarse breath sounds bilaterally. Few crackles at the bases. No wheezing. No rhonchi. Cardio: S1, S2 is irregularly irregular, tachycardic. No S3, S4. No rubs, or bruit. 1-2+ pitting edema bilateral lower extremities. GI: soft, non-tender; minimally distended. No bowel sounds. no masses,  no organomegaly Extremities: edema 1-2+ bilateral lower extremeties Pulses: Poorly palpable in the lower extremities Skin: Skin color, texture, turgor normal. No rashes or lesions Lymph nodes: Cervical, supraclavicular, and axillary nodes normal. Neurologic: Somnolent but arousable. Follows commands. No obvious neurological deficits noted.   Labs on Admission: I have personally reviewed following labs and imaging studies  CBC:  Recent Labs Lab 10/01/15 1040  WBC 7.7  NEUTROABS 5.7  HGB 12.6  HCT 36.0  MCV 84.7  PLT 175   Basic Metabolic Panel:  Recent Labs Lab 10/01/15 1040  NA 145  K 3.4*  CL 99*  CO2  33*  GLUCOSE 118*  BUN 29*  CREATININE 1.18*  CALCIUM 8.7*   GFR: Estimated Creatinine Clearance: 37 mL/min (by C-G formula based on SCr of 1.18 mg/dL (H)).  Liver Function Tests:  Recent Labs Lab 10/01/15 1040  AST 28  ALT 19  ALKPHOS 171*  BILITOT 0.9  PROT 7.2  ALBUMIN 3.7    Recent Labs Lab 10/01/15 1040  LIPASE 34   Urine analysis:    Component Value Date/Time   COLORURINE YELLOW 10/01/2015 1022   APPEARANCEUR CLEAR 10/01/2015 1022   LABSPEC 1.041 (H) 10/01/2015 1022   PHURINE 7.5 10/01/2015 1022   GLUCOSEU NEGATIVE 10/01/2015 1022   HGBUR NEGATIVE 10/01/2015 1022   BILIRUBINUR SMALL (A) 10/01/2015 1022   KETONESUR NEGATIVE 10/01/2015 1022   PROTEINUR NEGATIVE 10/01/2015 1022   NITRITE NEGATIVE 10/01/2015 1022   LEUKOCYTESUR NEGATIVE 10/01/2015 1022     Radiological Exams on Admission: Ct Abdomen Pelvis W Contrast  Result Date: 10/01/2015 CLINICAL DATA:  Nursing home patient with dementia, presenting with fecal emesis. Evaluate for bowel obstruction. EXAM: CT ABDOMEN AND PELVIS WITH CONTRAST TECHNIQUE: Multidetector CT imaging of the abdomen and pelvis was performed using the standard protocol following bolus administration of intravenous contrast. CONTRAST:  75mL ISOVUE-300 IOPAMIDOL (ISOVUE-300) INJECTION 61% COMPARISON:  None. FINDINGS: Lower chest: The heart is enlarged. There is atherosclerosis of the coronary arteries. No significant pleural or pericardial effusion. No confluent basilar airspace disease. Hepatobiliary: No focal hepatic abnormalities are identified. There is no significant biliary dilatation status post cholecystectomy. Pancreas: Diffusely atrophied. No evidence of pancreatic mass, ductal dilatation or surrounding inflammation. Spleen: Normal in size without focal abnormality. Adrenals/Urinary Tract: Both adrenal glands appear normal. Both kidneys demonstrate cortical thinning. There are cysts in the lower pole of the right kidney, measuring  up to 3.2 cm in diameter. No evidence of renal mass, urinary tract calculus or hydronephrosis. The bladder appears unremarkable for its degree of distention. Stomach/Bowel: Small hiatal hernia. The stomach, proximal and mid small bowel are mildly dilated with multiple air-fluid levels. The distal small bowel is decompressed. The transition point is suspected to be in the false pelvis (axial image 56). No surrounding soft tissue mass or fluid collection is apparent in this area. The colon is decompressed. No apparent fistula or focal extraluminal  fluid collection. There is a small amount of free pelvic fluid. Vascular/Lymphatic: There are no enlarged abdominal or pelvic lymph nodes. Patient has an aorto bi-iliac stent graft which appears patent. 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 No evidence of endograft leak. Both internal iliac arteries appear thrombosed status post probable embolization on the right. The external iliac arteries are patent. IVC filter noted with mild strut penetration. Reproductive: Hysterectomy.  No evidence of adnexal mass. Other: There is a small amount of ascites. There is generalized edema throughout the mesenteric and subcutaneous fat. No focal extraluminal fluid collections are seen. Musculoskeletal: No acute or significant osseous findings. The bones are demineralized. There is a possible sacral decubitus ulcer. No evidence of bone destruction. Lower lumbar spondylosis noted. IMPRESSION: 1. Findings are consistent with a mid to distal small bowel obstruction. Transition point appears to be in the false pelvis without discernible cause, suggesting adhesions. 2. No fistula or abscess identified. 3. Generalized soft tissue edema with mild pelvic ascites. 4. Patent aortic stent graft. Electronically Signed   By: Carey BullocksWilliam  Veazey M.D.   On: 10/01/2015 13:50   Dg Chest Portable 1 View  Result Date: 10/01/2015 CLINICAL DATA:  Nasogastric  tube placement. Decreased oxygen saturation. EXAM: PORTABLE CHEST 1 VIEW COMPARISON:  None. FINDINGS: Nasogastric tube tip and side port are below the diaphragm. No pneumothorax. There is fine nodular interstitial disease throughout the lungs with a mid to lower lung lung zone predominance. There is no airspace consolidation or volume loss. The heart size and pulmonary vascular normal. No adenopathy. There is atherosclerotic calcification in the aorta. There is degenerative change in each shoulder. IMPRESSION: Nasogastric tube tip and side port below the diaphragm. Fine nodular interstitial disease throughout the lungs of uncertain etiology or chronicity. No frank edema or consolidation. Cardiac silhouette within normal limits. There is aortic atherosclerosis. Electronically Signed   By: Bretta BangWilliam  Woodruff III M.D.   On: 10/01/2015 14:21   Dg Abd Portable 1v-small Bowel Protocol-position Verification  Result Date: 10/01/2015 CLINICAL DATA:  NG tube placement. EXAM: PORTABLE ABDOMEN - 1 VIEW COMPARISON:  CT 10/01/2015 FINDINGS: NG tube is in place with the tip in the perihilar region, possibly distal stomach or proximal duodenum. Side port is in the distal stomach. Decreasing small bowel dilatation. Prior cholecystectomy. No free air. IMPRESSION: NG tube tip in the peripyloric region, possibly distal stomach or proximal duodenum. Decreasing small bowel dilatation. Electronically Signed   By: Charlett NoseKevin  Dover M.D.   On: 10/01/2015 16:21    My interpretation of Electrocardiogram: EKG shows atrial fibrillation with rapid ventricular response at 146 bpm. Rate related ST segment changes noted.   Problem List  Principal Problem:   SBO (small bowel obstruction) (HCC) Active Problems:   Dementia with behavioral disturbance   Atrial fibrillation with RVR (HCC)   Personal history of PE (pulmonary embolism)   History of CVA (cerebrovascular accident)   Personal history of DVT (deep vein thrombosis)   Chronic CHF  (congestive heart failure) (HCC)   Assessment: This is a 82 year old African-American female with a past medical history as stated earlier, who presents with nausea, vomiting, ongoing for the past 3-4 days and is found to have a small bowel obstruction. She is also in atrial fibrillation with RVR. She is somnolent, most likely due to medications.  Plan: #1 Small bowel obstruction: NG tube has been placed. This is most likely secondary to adhesions. General surgery has been consulted.  Patient will be kept nothing by mouth. IV fluids. She'll be monitored closely. Management per general surgery.  #2 Atrial fibrillation with RVR: She has a long-standing history of atrial fibrillation. She is on Apixaban at home. She was given bolus doses of diltiazem without any improvement. She'll be placed on an infusion now. Check TSH. She had an echocardiogram back in December at [facility]. This was accessed via careeverywhere. She had a EF of 50-55%. Valvular disease was noted, but none of them appeared to be severe at that time.   #3 Heme positive gastric fluid: No overt hematemesis has been noted. Heme positivity could have been due to her presenting illness. She will be taken off of the anticoagulation for now. She'll be given PPI. Monitor CBCs.  #4 history of dementia: Stable.Monitor closely.  #5 history of seizure disorder on Dilantin. Pharmacy will be consulted to dose this intravenously.  #6 Previous history of DVT and PE: Not an active issue per family. She does have lower extremity edema, which is getting better per family since she was placed on Lasix and the dose was increased recently. Continue to monitor for now.   DVT Prophylaxis: Only SCDs for now Code Status: DO NOT RESUSCITATE Family Communication: Discussed with the patient's 2 daughters  Disposition Plan: Stepdown admission for now. Anticipate returning to skilled nursing facility when medically improved  Consults called: General surgery    Admission status: Patient with small bowel obstruction which is a serious medical condition requiring inpatient hospitalization.   Further management decisions will depend on results of further testing and patient's response to treatment.   [facility]  [facility] Pager [phone]  If 7PM-7AM, please contact night-coverage [email or web address] Password [facility]  10/01/2015, 5:12 PM

## 2015-10-01 NOTE — ED Provider Notes (Signed)
[facility]-EMERGENCY DEPT Provider Note   CSN: [id number] Arrival date & time: 10/01/15  8295     History   Chief Complaint Chief Complaint  Patient presents with  . Bowel Obstruction    Confirmed by MD    HPI Brandi Heath is a 82 y.o. female atrial fibrillation, CAD, chronic DVT on L request, HTN, dementia, seizures who presents to the ED today from nursing home facility to rule out possible bowel obstruction. Level V caveat, dementia/altered mental status. Per EMS, patient had an x-ray done yesterday that showed a bowel obstruction. Today she began having uncontrollable vomiting with fecal matter. Patient is arousable but very disoriented in the ED. Patient is actively vomiting.  HPI  Past Medical History:  Diagnosis Date  . Atrial fibrillation (HCC)   . Clotting disorder (HCC)   . Coronary artery disease   . Dementia without behavioral disturbance   . DVT (deep venous thrombosis) (HCC)   . Hyperlipidemia   . Hypertension   . OSA (obstructive sleep apnea)   . Polyneuropathy (HCC)   . Seizures (HCC)   . Stroke [facility])     Patient Active Problem List   Diagnosis Date Noted  . Chronic CHF (congestive heart failure) (HCC) 09/29/2015  . Venous insufficiency (chronic) (peripheral) 09/29/2015  . Vitamin B12 deficiency 09/29/2015  . Anemia, chronic disease 09/29/2015  . Enterococcus UTI 09/02/2015  . GERD (gastroesophageal reflux disease) 09/02/2015  . Adynamic ileus (HCC) 06/11/2015  . CHF, acute on chronic (HCC) 06/11/2015  . Personal history of DVT (deep vein thrombosis) 06/11/2015  . Nausea with vomiting 06/03/2015  . Jaw pain 05/27/2015  . E. coli UTI 04/22/2015  . Prerenal acute renal failure (HCC) 04/22/2015  . History of CVA (cerebrovascular accident) 04/22/2015  . AF (atrial fibrillation) (HCC) 04/22/2015  . Polyneuropathy (HCC) 04/22/2015  . Edema 01/25/2015  . Atrial fibrillation with RVR (HCC) 01/20/2015  . Acute encephalopathy 01/20/2015  . UTI (urinary tract  infection) 01/20/2015  . Tracheobronchomalacia 01/20/2015  . Personal history of PE (pulmonary embolism) 01/20/2015  . Depression 01/20/2015  . OSA (obstructive sleep apnea)   . Dementia with behavioral disturbance   . Seizures (HCC)   . Hyperlipidemia   . Hypertensive heart disease with CHF (congestive heart failure) (HCC)   . Clotting disorder (HCC)   . Cerebrovascular accident (CVA) due to thrombosis [facility])     Past Surgical History:  Procedure Laterality Date  . ABDOMINAL AORTIC ANEURYSM REPAIR    . ABDOMINAL HYSTERECTOMY    . CHOLECYSTECTOMY    . SPINAL FUSION  2003    OB History    No data available       Home Medications    Prior to Admission medications   Medication Sig Start Date End Date Taking? Authorizing Provider  acetaminophen (TYLENOL) 325 MG tablet Take 650 mg by mouth every 4 (four) hours as needed (Pain). pain    Yes Historical Provider, MD  acetaminophen (TYLENOL) 650 MG suppository Place 650 mg rectally every 6 (six) hours. For 2 days. Started 9/17   Yes Historical Provider, MD  apixaban (ELIQUIS) 2.5 MG TABS tablet Take 2.5 mg by mouth 2 (two) times daily.   Yes Historical Provider, MD  Calcium Carbonate-Vitamin D (CALCIUM-VITAMIN D) 500-200 MG-UNIT tablet Take 1 tablet by mouth daily.    Yes Historical Provider, MD  Dextromethorphan-Guaifenesin (GUAIFENESIN DM PO) Take 10 mLs by mouth every 4 (four) hours as needed (Cough).    Yes Historical Provider, MD  diltiazem [facility]) 360  MG 24 hr capsule Take 360 mg by mouth daily.   Yes Historical Provider, MD  docusate sodium (COLACE) 100 MG capsule Take 100 mg by mouth 2 (two) times daily as needed for mild constipation.    Yes Historical Provider, MD  donepezil (ARICEPT) 10 MG tablet Take 10 mg by mouth at bedtime.    Yes Historical Provider, MD  DULoxetine (CYMBALTA) 60 MG capsule Take 60 mg by mouth at bedtime.    Yes Historical Provider, MD  fexofenadine (ALLEGRA) 180 MG tablet Take 180 mg by mouth daily.     Yes Historical Provider, MD  furosemide (LASIX) 40 MG tablet Take 40 mg by mouth daily.   Yes Historical Provider, MD  Iron-FA-B Cmp-C-Biot-Probiotic (FUSION PLUS) CAPS Take 1 capsule by mouth daily.   Yes Historical Provider, MD  loperamide (IMODIUM) 2 MG capsule Take 4 mg by mouth 4 (four) times daily as needed for diarrhea or loose stools.   Yes Historical Provider, MD  Magnesium Oxide -Mg Supplement 400 MG CAPS Take 1 tablet by mouth daily.   Yes Historical Provider, MD  metoCLOPramide (REGLAN) 10 MG tablet Take 10 mg by mouth 2 (two) times daily.   Yes Historical Provider, MD  Metoprolol Tartrate 75 MG TABS Take 75 mg by mouth daily. Hold for SBP < 90   Yes Historical Provider, MD  mirabegron ER (MYRBETRIQ) 25 MG TB24 tablet Take 25 mg by mouth daily. For over active bladder   Yes Historical Provider, MD  Multiple Vitamins-Minerals (MULTIVITAMIN WITH MINERALS) tablet Take 1 tablet by mouth daily.   Yes Historical Provider, MD  Nutritional Supplements (ENSURE ENLIVE PO) Take 1 Can by mouth daily before breakfast.   Yes Historical Provider, MD  omeprazole (PRILOSEC) 40 MG capsule Take 40 mg by mouth 2 (two) times daily.   Yes Historical Provider, MD  phenytoin (DILANTIN) 100 MG ER capsule Take 200 mg by mouth 2 (two) times daily.   Yes Historical Provider, MD  polyethylene glycol (MIRALAX / GLYCOLAX) packet Take 17 g by mouth daily. Hold for more than 3 bms in a day   Yes Historical Provider, MD  promethazine (PHENERGAN) 25 MG suppository Place 25 mg rectally every 12 (twelve) hours as needed for nausea or vomiting.   Yes Historical Provider, MD  risperiDONE (RISPERDAL) 1 MG tablet Take 1 mg by mouth 2 (two) times daily.   Yes Historical Provider, MD  simvastatin (ZOCOR) 20 MG tablet Take 20 mg by mouth daily at 6 PM.    Yes Historical Provider, MD  travoprost, benzalkonium, (TRAVATAN) 0.004 % ophthalmic solution Place 1 drop into both eyes at bedtime.   Yes Historical Provider, MD  vitamin B-12  (CYANOCOBALAMIN) 1000 MCG tablet Take 1,000 mcg by mouth daily.   Yes Historical Provider, MD    Family History Family History  Problem Relation Age of Onset  . Hypertension Mother   . Heart disease Mother   . Heart disease Father   . Cancer Sister     breast  . Diabetes Sister   . Heart disease Son   . Hypertension Son   . Thyroid disease Sister     Social History Social History  Substance Use Topics  . Smoking status: Never Smoker  . Smokeless tobacco: Never Used  . Alcohol use No     Allergies   Penicillins   Review of Systems Review of Systems  Unable to perform ROS: Dementia     Physical Exam Updated Vital Signs BP 178/94  Pulse 93   Temp 98.1 F (36.7 C) (Oral)   Resp 21   SpO2 96%   Physical Exam  Constitutional: No distress.  Ill-appearing, actively vomiting  HENT:  Head: Normocephalic and atraumatic.  Mouth/Throat: No oropharyngeal exudate.  Eyes: Conjunctivae and EOM are normal. Pupils are equal, round, and reactive to light. Right eye exhibits no discharge. Left eye exhibits no discharge. No scleral icterus.  Cardiovascular: Normal rate, regular rhythm, normal heart sounds and intact distal pulses.  Exam reveals no gallop and no friction rub.   No murmur heard. Pulmonary/Chest: Effort normal and breath sounds normal. No respiratory distress. She has no wheezes. She has no rales. She exhibits no tenderness.  Abdominal: Soft. She exhibits distension. There is no tenderness. There is no guarding.  Musculoskeletal: Normal range of motion. She exhibits no edema.  Neurological: She is alert.  Skin: Skin is warm and dry. No rash noted. She is not diaphoretic. No erythema. No pallor.  Psychiatric: She has a normal mood and affect. Her behavior is normal.  Nursing note and vitals reviewed.    ED Treatments / Results  Labs (all labs ordered are listed, but only abnormal results are displayed) Labs Reviewed  COMPREHENSIVE METABOLIC PANEL -  Abnormal; Notable for the following:       Result Value   Potassium 3.4 (*)    Chloride 99 (*)    CO2 33 (*)    Glucose, Bld 118 (*)    BUN 29 (*)    Creatinine, Ser 1.18 (*)    Calcium 8.7 (*)    Alkaline Phosphatase 171 (*)    GFR calc non Af Amer 41 (*)    GFR calc Af Amer 47 (*)    All other components within normal limits  CBC WITH DIFFERENTIAL/PLATELET - Abnormal; Notable for the following:    RDW 15.9 (*)    Monocytes Absolute 1.1 (*)    All other components within normal limits  OCCULT BLOOD GASTRIC / DUODENUM (SPECIMEN CUP) - Abnormal; Notable for the following:    Occult Blood, Gastric POSITIVE (*)    All other components within normal limits  LIPASE, BLOOD  URINALYSIS, ROUTINE W REFLEX MICROSCOPIC (NOT AT [facility])  TYPE AND SCREEN  ABO/RH    EKG  EKG Interpretation None       Radiology Ct Abdomen Pelvis W Contrast  Result Date: 10/01/2015 CLINICAL DATA:  Nursing home patient with dementia, presenting with fecal emesis. Evaluate for bowel obstruction. EXAM: CT ABDOMEN AND PELVIS WITH CONTRAST TECHNIQUE: Multidetector CT imaging of the abdomen and pelvis was performed using the standard protocol following bolus administration of intravenous contrast. CONTRAST:  75mL ISOVUE-300 IOPAMIDOL (ISOVUE-300) INJECTION 61% COMPARISON:  None. FINDINGS: Lower chest: The heart is enlarged. There is atherosclerosis of the coronary arteries. No significant pleural or pericardial effusion. No confluent basilar airspace disease. Hepatobiliary: No focal hepatic abnormalities are identified. There is no significant biliary dilatation status post cholecystectomy. Pancreas: Diffusely atrophied. No evidence of pancreatic mass, ductal dilatation or surrounding inflammation. Spleen: Normal in size without focal abnormality. Adrenals/Urinary Tract: Both adrenal glands appear normal. Both kidneys demonstrate cortical thinning. There are cysts in the lower pole of the right kidney, measuring up to 3.2  cm in diameter. No evidence of renal mass, urinary tract calculus or hydronephrosis. The bladder appears unremarkable for its degree of distention. Stomach/Bowel: Small hiatal hernia. The stomach, proximal and mid small bowel are mildly dilated with multiple air-fluid levels. The distal small bowel is decompressed. The  transition point is suspected to be in the false pelvis (axial image 56). No surrounding soft tissue mass or fluid collection is apparent in this area. The colon is decompressed. No apparent fistula or focal extraluminal fluid collection. There is a small amount of free pelvic fluid. Vascular/Lymphatic: There are no enlarged abdominal or pelvic lymph nodes. Patient has an aorto bi-iliac stent graft which appears patent. 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 No evidence of endograft leak. Both internal iliac arteries appear thrombosed status post probable embolization on the right. The external iliac arteries are patent. IVC filter noted with mild strut penetration. Reproductive: Hysterectomy.  No evidence of adnexal mass. Other: There is a small amount of ascites. There is generalized edema throughout the mesenteric and subcutaneous fat. No focal extraluminal fluid collections are seen. Musculoskeletal: No acute or significant osseous findings. The bones are demineralized. There is a possible sacral decubitus ulcer. No evidence of bone destruction. Lower lumbar spondylosis noted. IMPRESSION: 1. Findings are consistent with a mid to distal small bowel obstruction. Transition point appears to be in the false pelvis without discernible cause, suggesting adhesions. 2. No fistula or abscess identified. 3. Generalized soft tissue edema with mild pelvic ascites. 4. Patent aortic stent graft. Electronically Signed   By: Carey Bullocks M.D.   On: 10/01/2015 13:50   Dg Chest Portable 1 View  Result Date: 10/01/2015 CLINICAL DATA:  Nasogastric tube  placement. Decreased oxygen saturation. EXAM: PORTABLE CHEST 1 VIEW COMPARISON:  None. FINDINGS: Nasogastric tube tip and side port are below the diaphragm. No pneumothorax. There is fine nodular interstitial disease throughout the lungs with a mid to lower lung lung zone predominance. There is no airspace consolidation or volume loss. The heart size and pulmonary vascular normal. No adenopathy. There is atherosclerotic calcification in the aorta. There is degenerative change in each shoulder. IMPRESSION: Nasogastric tube tip and side port below the diaphragm. Fine nodular interstitial disease throughout the lungs of uncertain etiology or chronicity. No frank edema or consolidation. Cardiac silhouette within normal limits. There is aortic atherosclerosis. Electronically Signed   By: Bretta Bang III M.D.   On: 10/01/2015 14:21    Procedures Procedures (including critical care time)  Medications Ordered in ED Medications  pantoprazole (PROTONIX) 80 mg in sodium chloride 0.9 % 100 mL IVPB (not administered)  pantoprazole (PROTONIX) 80 mg in sodium chloride 0.9 % 250 mL (0.32 mg/mL) infusion (not administered)  sodium chloride 0.9 % bolus 1,000 mL (0 mLs Intravenous Stopped 10/01/15 1356)  diltiazem (CARDIZEM) injection 10 mg (10 mg Intravenous Given 10/01/15 1327)  iopamidol (ISOVUE-300) 61 % injection 75 mL (75 mLs Intravenous Contrast Given 10/01/15 1316)     Initial Impression / Assessment and Plan / ED Course  I have reviewed the triage vital signs and the nursing notes.  Pertinent labs & imaging results that were available during my care of the patient were reviewed by me and considered in my medical decision making (see chart for details).  82 year old female presents to the ED brought in by nursing facility with complaints of possible small bowel obstruction. Patient is actively vomiting in the ED, emesis appears to be fecal matter. Vitals are otherwise stable. NG tube placed and patient  had 4 canisters of gastric output which is gastric occult positive. After volume was removed from stomach with NG tube, patient's heart rate spiked to the 150s. EKG obtained which reveals A. fib with RVR. Patient has  A. fib at baseline. She was given fluids and 10 of Cardizem. CT reveals distal small bowel obstruction. BUN also mildly elevated from baseline. Will administer IV Protonix.   Patient's heart rate still elevated. We'll administer additional 15 mg of Cardizem and placed on a drip. Pt also given 1L bolus of fluids.  Clinical Course  Comment By Time  Spoke with general surgery PA Brooke who will consult pt in ED Dub Mikes, PA-C 09/18 1409   We'll call hospitalist for admission.   3:39 PM Spoke with Dr. Rito Ehrlich with hospitalist service who recommend admission to stepdown   Patient was discussed with and seen by Dr. Patria Mane who agrees with the treatment plan.   Final Clinical Impressions(s) / ED Diagnoses   Final diagnoses:  Small bowel obstruction (HCC)  Atrial fibrillation with rapid ventricular response [facility])    New Prescriptions New Prescriptions   No medications on file     Dub Mikes, PA-C 10/01/15 1540    Azalia Bilis, MD 10/02/15 1540

## 2015-10-01 NOTE — ED Notes (Signed)
Bed: WA17 Expected date: 10/01/15 Expected time: 9:46 AM Means of arrival: Ambulance Comments: ? BO

## 2015-10-02 ENCOUNTER — Encounter (HOSPITAL_COMMUNITY): Payer: Self-pay | Admitting: Family Medicine

## 2015-10-02 ENCOUNTER — Encounter: Payer: Self-pay | Admitting: Internal Medicine

## 2015-10-02 ENCOUNTER — Inpatient Hospital Stay (HOSPITAL_COMMUNITY): Payer: Medicare Other

## 2015-10-02 DIAGNOSIS — D638 Anemia in other chronic diseases classified elsewhere: Secondary | ICD-10-CM

## 2015-10-02 DIAGNOSIS — R059 Cough, unspecified: Secondary | ICD-10-CM

## 2015-10-02 DIAGNOSIS — R05 Cough: Secondary | ICD-10-CM

## 2015-10-02 DIAGNOSIS — I482 Chronic atrial fibrillation, unspecified: Secondary | ICD-10-CM

## 2015-10-02 DIAGNOSIS — I509 Heart failure, unspecified: Secondary | ICD-10-CM

## 2015-10-02 LAB — COMPREHENSIVE METABOLIC PANEL
ALT: 15 U/L (ref 14–54)
AST: 40 U/L (ref 15–41)
Albumin: 3.1 g/dL — ABNORMAL LOW (ref 3.5–5.0)
Alkaline Phosphatase: 137 U/L — ABNORMAL HIGH (ref 38–126)
Anion gap: 11 (ref 5–15)
BUN: 27 mg/dL — ABNORMAL HIGH (ref 6–20)
CHLORIDE: 100 mmol/L — AB (ref 101–111)
CO2: 31 mmol/L (ref 22–32)
Calcium: 7.9 mg/dL — ABNORMAL LOW (ref 8.9–10.3)
Creatinine, Ser: 1.12 mg/dL — ABNORMAL HIGH (ref 0.44–1.00)
GFR, EST AFRICAN AMERICAN: 50 mL/min — AB (ref >60)
GFR, EST NON AFRICAN AMERICAN: 43 mL/min — AB (ref >60)
Glucose, Bld: 82 mg/dL (ref 65–99)
POTASSIUM: 4.6 mmol/L (ref 3.5–5.1)
Sodium: 142 mmol/L (ref 135–145)
Total Bilirubin: 0.9 mg/dL (ref 0.3–1.2)
Total Protein: 6.3 g/dL — ABNORMAL LOW (ref 6.5–8.1)

## 2015-10-02 LAB — TSH: TSH: 2.03 u[IU]/mL (ref 0.350–4.500)

## 2015-10-02 LAB — CBC
HEMATOCRIT: 35.4 % — AB (ref 36.0–46.0)
HEMOGLOBIN: 11.9 g/dL — AB (ref 12.0–15.0)
MCH: 29.4 pg (ref 26.0–34.0)
MCHC: 33.6 g/dL (ref 30.0–36.0)
MCV: 87.4 fL (ref 78.0–100.0)
Platelets: 155 10*3/uL (ref 150–400)
RBC: 4.05 MIL/uL (ref 3.87–5.11)
RDW: 15.5 % (ref 11.5–15.5)
WBC: 7.8 10*3/uL (ref 4.0–10.5)

## 2015-10-02 LAB — PHENYTOIN LEVEL, TOTAL: Phenytoin Lvl: 15.5 ug/mL (ref 10.0–20.0)

## 2015-10-02 MED ORDER — DILTIAZEM HCL ER BEADS 240 MG PO CP24: 360.0000 mg | ORAL_CAPSULE | Freq: Every day | ORAL | Status: DC

## 2015-10-02 MED ORDER — DILTIAZEM HCL ER COATED BEADS 180 MG PO CP24
360.0000 mg | ORAL_CAPSULE | Freq: Every day | ORAL | Status: DC
  Administered 2015-10-02: 360 mg via ORAL
  Filled 2015-10-02: qty 2

## 2015-10-02 MED ORDER — METOCLOPRAMIDE HCL 10 MG PO TABS
10.0000 mg | ORAL_TABLET | Freq: Two times a day (BID) | ORAL | Status: DC
  Administered 2015-10-02 (×2): 10 mg via ORAL
  Filled 2015-10-02 (×2): qty 1

## 2015-10-02 MED ORDER — ATORVASTATIN CALCIUM 10 MG PO TABS
10.0000 mg | ORAL_TABLET | Freq: Every day | ORAL | Status: DC
  Administered 2015-10-02: 10 mg via ORAL
  Filled 2015-10-02: qty 1

## 2015-10-02 MED ORDER — DULOXETINE HCL 30 MG PO CPEP
60.0000 mg | ORAL_CAPSULE | Freq: Every day | ORAL | Status: DC
  Administered 2015-10-02: 60 mg via ORAL
  Filled 2015-10-02: qty 1

## 2015-10-02 MED ORDER — DONEPEZIL HCL 10 MG PO TABS
10.0000 mg | ORAL_TABLET | Freq: Every day | ORAL | Status: DC
Start: 2015-10-02 — End: 2015-10-03
  Administered 2015-10-02: 10 mg via ORAL
  Filled 2015-10-02: qty 2

## 2015-10-02 MED ORDER — PHENYTOIN SODIUM EXTENDED 100 MG PO CAPS
200.0000 mg | ORAL_CAPSULE | Freq: Every day | ORAL | Status: DC
Start: 2015-10-02 — End: 2015-10-03
  Administered 2015-10-02: 200 mg via ORAL
  Filled 2015-10-02: qty 2

## 2015-10-02 MED ORDER — SIMVASTATIN 10 MG PO TABS: 20.0000 mg | ORAL_TABLET | Freq: Every day | ORAL | Status: DC

## 2015-10-02 MED ORDER — PHENYTOIN SODIUM EXTENDED 100 MG PO CAPS
100.0000 mg | ORAL_CAPSULE | Freq: Every day | ORAL | Status: DC
  Filled 2015-10-02: qty 1

## 2015-10-02 MED ORDER — RISPERIDONE 1 MG PO TABS
1.0000 mg | ORAL_TABLET | Freq: Two times a day (BID) | ORAL | Status: DC
  Administered 2015-10-02 (×2): 1 mg via ORAL
  Filled 2015-10-02: qty 2
  Filled 2015-10-02: qty 1

## 2015-10-02 MED ORDER — PANTOPRAZOLE SODIUM 40 MG PO TBEC
40.0000 mg | DELAYED_RELEASE_TABLET | Freq: Two times a day (BID) | ORAL | Status: DC
  Administered 2015-10-02 (×2): 40 mg via ORAL
  Filled 2015-10-02 (×2): qty 1

## 2015-10-02 MED ORDER — METOPROLOL TARTRATE 25 MG PO TABS
75.0000 mg | ORAL_TABLET | Freq: Every day | ORAL | Status: DC
  Administered 2015-10-02: 75 mg via ORAL
  Filled 2015-10-02: qty 3

## 2015-10-02 NOTE — Progress Notes (Signed)
PROGRESS NOTE    Brandi Heath  CHE:[id number]  DOB: 1927-12-15  DOA: 10/01/2015 PCP: Merrilee Seashore, MD Outpatient Specialists:  Hospital course: Brandi Heath is a 82 y.o. female with a past medical history of dementia, previous episodes of small bowel obstruction, atrial fibrillation on anticoagulation, history of PE and DVT, history of stroke, history of seizure disorder who was in her usual state of health at her nursing facility, [facility], till this past Thursday and Friday when she started having nausea followed by vomiting.  She was admitted with small bowel obstruction secondary to adhesions from previous surgery.  She was also in Afib with RVR and started on IV diltiazem infusion.    Assessment & Plan: #1 Small bowel obstruction: NG tube has been placed. This is most likely secondary to adhesions. General surgery has been consulted. Patient will be kept nothing by mouth. IV fluids. She'll be monitored closely. Management per general surgery.  NGT can be removed today and will be restarting some home oral meds.  Continue IVF hydration.    #2 Atrial fibrillation with RVR: She has a long-standing history of atrial fibrillation. She is on Apixaban at home. She was given bolus doses of diltiazem without any improvement. She'll be placed on an infusion now. Check TSH. She had an echocardiogram back in December at [facility].  She had a EF of 50-55%. Valvular disease was noted, but none of them appeared to be severe at that time. Pt still on diltiazem drip but would resume oral diltiazem today and plan to get her off the drip.    #3 Heme positive gastric fluid: No overt hematemesis has been noted. Heme positivity could have been due to her presenting illness. She will be taken off of the anticoagulation for now. She'll be given PPI. Monitor CBCs.  #4 history of dementia: Stable. Monitor closely.  #5 history of seizure disorder on Dilantin. Plan on resuming her home oral meds 9/19.    #6  Previous history of DVT and PE: Not an active issue per family. She does have lower extremity edema, which is getting better per family since she was placed on Lasix and the dose was increased recently. Continue to monitor for now.  #7 Acute/Chronic Renal Insufficiency - Pt is being hydrated with IVFs.  Will follow creatinine, avoid nephrotoxins, renally dose medications.   DVT Prophylaxis: Only SCDs for now Code Status: DO NOT RESUSCITATE Family Communication: Discussed with the patient's 2 daughters  Disposition Plan: Stepdown admission for now. Anticipate returning to skilled nursing facility when medically improved  Consults called: General surgery  Admission status: Patient with small bowel obstruction which is a serious medical condition requiring inpatient hospitalization.  Subjective: Pt appears comfortable, she is a lot more alert and having multiple BMs  Objective: Vitals:   10/02/15 0353 10/02/15 0409 10/02/15 0500 10/02/15 0600  BP:   127/77 132/63  Pulse:      Resp:   18 17  Temp:  98.7 F (37.1 C)    TempSrc:  Oral    SpO2:      Weight: 68.1 kg (150 lb 2.1 oz)     Height:        Intake/Output Summary (Last 24 hours) at 10/02/15 0715 Last data filed at 10/02/15 0600  Gross per 24 hour  Intake          1397.67 ml  Output             5800 ml  Net         -  4402.33 ml   Filed Weights   10/01/15 1720 10/02/15 0353  Weight: 69.6 kg (153 lb 7 oz) 68.1 kg (150 lb 2.1 oz)    Exam:  General exam: no apparent distress Respiratory system: Clear. No increased work of breathing. Cardiovascular system: S1 & S2 heard, irregular rate. No JVD Gastrointestinal system: Abdomen is mildly distended, soft . Hypoactive BS. Central nervous system: Alert and oriented. No focal neurological deficits. Extremities: no cyanosis.  Data Reviewed: Basic Metabolic Panel:  Recent Labs Lab 10/01/15 1040 10/02/15 0214  NA 145 142  K 3.4* 4.6  CL 99* 100*  CO2 33* 31  GLUCOSE  118* 82  BUN 29* 27*  CREATININE 1.18* 1.12*  CALCIUM 8.7* 7.9*   Liver Function Tests:  Recent Labs Lab 10/01/15 1040 10/02/15 0214  AST 28 40  ALT 19 15  ALKPHOS 171* 137*  BILITOT 0.9 0.9  PROT 7.2 6.3*  ALBUMIN 3.7 3.1*    Recent Labs Lab 10/01/15 1040  LIPASE 34   No results for input(s): AMMONIA in the last 168 hours. CBC:  Recent Labs Lab 10/01/15 1040 10/01/15 2019 10/02/15 0214  WBC 7.7 7.9 7.8  NEUTROABS 5.7  --   --   HGB 12.6 11.9* 11.9*  HCT 36.0 35.4* 35.4*  MCV 84.7 87.2 87.4  PLT 175 163 155   Cardiac Enzymes: No results for input(s): CKTOTAL, CKMB, CKMBINDEX, TROPONINI in the last 168 hours. CBG (last 3)  No results for input(s): GLUCAP in the last 72 hours. Recent Results (from the past 240 hour(s))  MRSA PCR Screening     Status: None   Collection Time: 10/01/15  5:14 PM  Result Value Ref Range Status   MRSA by PCR NEGATIVE NEGATIVE Final    Comment:        The GeneXpert MRSA Assay (FDA approved for NASAL specimens only), is one component of a comprehensive MRSA colonization surveillance program. It is not intended to diagnose MRSA infection nor to guide or monitor treatment for MRSA infections.      Studies: Ct Abdomen Pelvis W Contrast  Result Date: 10/01/2015 CLINICAL DATA:  Nursing home patient with dementia, presenting with fecal emesis. Evaluate for bowel obstruction. EXAM: CT ABDOMEN AND PELVIS WITH CONTRAST TECHNIQUE: Multidetector CT imaging of the abdomen and pelvis was performed using the standard protocol following bolus administration of intravenous contrast. CONTRAST:  75mL ISOVUE-300 IOPAMIDOL (ISOVUE-300) INJECTION 61% COMPARISON:  None. FINDINGS: Lower chest: The heart is enlarged. There is atherosclerosis of the coronary arteries. No significant pleural or pericardial effusion. No confluent basilar airspace disease. Hepatobiliary: No focal hepatic abnormalities are identified. There is no significant biliary  dilatation status post cholecystectomy. Pancreas: Diffusely atrophied. No evidence of pancreatic mass, ductal dilatation or surrounding inflammation. Spleen: Normal in size without focal abnormality. Adrenals/Urinary Tract: Both adrenal glands appear normal. Both kidneys demonstrate cortical thinning. There are cysts in the lower pole of the right kidney, measuring up to 3.2 cm in diameter. No evidence of renal mass, urinary tract calculus or hydronephrosis. The bladder appears unremarkable for its degree of distention. Stomach/Bowel: Small hiatal hernia. The stomach, proximal and mid small bowel are mildly dilated with multiple air-fluid levels. The distal small bowel is decompressed. The transition point is suspected to be in the false pelvis (axial image 56). No surrounding soft tissue mass or fluid collection is apparent in this area. The colon is decompressed. No apparent fistula or focal extraluminal fluid collection. There is a small amount of free pelvic fluid.  Vascular/Lymphatic: There are no enlarged abdominal or pelvic lymph nodes. Patient has an aorto bi-iliac stent graft which appears patent. 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 No evidence of endograft leak. Both internal iliac arteries appear thrombosed status post probable embolization on the right. The external iliac arteries are patent. IVC filter noted with mild strut penetration. Reproductive: Hysterectomy.  No evidence of adnexal mass. Other: There is a small amount of ascites. There is generalized edema throughout the mesenteric and subcutaneous fat. No focal extraluminal fluid collections are seen. Musculoskeletal: No acute or significant osseous findings. The bones are demineralized. There is a possible sacral decubitus ulcer. No evidence of bone destruction. Lower lumbar spondylosis noted. IMPRESSION: 1. Findings are consistent with a mid to distal small bowel obstruction. Transition  point appears to be in the false pelvis without discernible cause, suggesting adhesions. 2. No fistula or abscess identified. 3. Generalized soft tissue edema with mild pelvic ascites. 4. Patent aortic stent graft. Electronically Signed   By: Carey Bullocks M.D.   On: 10/01/2015 13:50   Dg Chest Portable 1 View  Result Date: 10/01/2015 CLINICAL DATA:  Nasogastric tube placement. Decreased oxygen saturation. EXAM: PORTABLE CHEST 1 VIEW COMPARISON:  None. FINDINGS: Nasogastric tube tip and side port are below the diaphragm. No pneumothorax. There is fine nodular interstitial disease throughout the lungs with a mid to lower lung lung zone predominance. There is no airspace consolidation or volume loss. The heart size and pulmonary vascular normal. No adenopathy. There is atherosclerotic calcification in the aorta. There is degenerative change in each shoulder. IMPRESSION: Nasogastric tube tip and side port below the diaphragm. Fine nodular interstitial disease throughout the lungs of uncertain etiology or chronicity. No frank edema or consolidation. Cardiac silhouette within normal limits. There is aortic atherosclerosis. Electronically Signed   By: Bretta Bang III M.D.   On: 10/01/2015 14:21   Dg Abd Portable 1v-small Bowel Obstruction Protocol-initial, 8 Hr Delay  Result Date: 10/02/2015 CLINICAL DATA:  Small bowel obstruction.  8 hour post contrast film. EXAM: PORTABLE ABDOMEN - 1 VIEW COMPARISON:  10/01/2015 FINDINGS: Enteric tube tip is in the right upper quadrant, likely in the distal stomach or duodenum bulb region. Gaseous distention of the stomach. Contrast material is demonstrated throughout the colon suggesting no evidence of complete small bowel obstruction. Vascular stents and coils. Inferior vena caval filter. Surgical clips in the right abdomen. IMPRESSION: Contrast material is present in the colon suggesting no evidence of complete small bowel obstruction. Electronically Signed   By:  Burman Nieves M.D.   On: 10/02/2015 04:40   Dg Abd Portable 1v-small Bowel Protocol-position Verification  Result Date: 10/01/2015 CLINICAL DATA:  NG tube placement. EXAM: PORTABLE ABDOMEN - 1 VIEW COMPARISON:  CT 10/01/2015 FINDINGS: NG tube is in place with the tip in the perihilar region, possibly distal stomach or proximal duodenum. Side port is in the distal stomach. Decreasing small bowel dilatation. Prior cholecystectomy. No free air. IMPRESSION: NG tube tip in the peripyloric region, possibly distal stomach or proximal duodenum. Decreasing small bowel dilatation. Electronically Signed   By: Charlett Nose M.D.   On: 10/01/2015 16:21   Scheduled Meds: . latanoprost  1 drop Both Eyes QHS  . pantoprazole  40 mg Intravenous Q12H  . sodium chloride flush  3 mL Intravenous Q12H   Continuous Infusions: . sodium chloride 75 mL/hr at 10/02/15 0450  . diltiazem (CARDIZEM) infusion 15 mg/hr (10/02/15 0449)  Principal Problem:   SBO (small bowel obstruction) (HCC) Active Problems:   Dementia with behavioral disturbance   Atrial fibrillation with RVR (HCC)   Personal history of PE (pulmonary embolism)   History of CVA (cerebrovascular accident)   Personal history of DVT (deep vein thrombosis)   Chronic CHF (congestive heart failure) [facility](HCC)  Critical Care Time spent: 35 mins  Standley Dakinslanford Johnson, MD, FAAFP [facility] Pager [phone]  If 7PM-7AM, please contact night-coverage [email or web address] Password [email or web address] 10/02/2015, 7:15 AM    LOS: 1 day

## 2015-10-02 NOTE — Assessment & Plan Note (Signed)
Good.

## 2015-10-02 NOTE — Progress Notes (Signed)
This encounter was created in error - please disregard.

## 2015-10-02 NOTE — Progress Notes (Signed)
OT Cancellation Note  Patient Details Name: Brandi Heath MRN: [id number] DOB: 1927-07-21   Cancelled Treatment:    Reason Eval/Treat Not Completed: Other (comment) - Patient is from SNF and plan is to return to SNF. Will defer all OT needs to SNF.  Chadley Dziedzic A 10/02/2015, 7:26 AM

## 2015-10-02 NOTE — Progress Notes (Signed)
Subjective: Multiple bms, more awake this am  Objective: Vital signs in last 24 hours: Temp:  [97.5 F (36.4 C)-98.7 F (37.1 C)] 98.7 F (37.1 C) (09/19 0409) Pulse Rate:  [33-161] 63 (09/18 1800) Resp:  [13-26] 17 (09/19 0600) BP: (82-178)/(48-105) 132/63 (09/19 0600) SpO2:  [78 %-100 %] 100 % (09/18 1800) Weight:  [68.1 kg (150 lb 2.1 oz)-69.6 kg (153 lb 7 oz)] 68.1 kg (150 lb 2.1 oz) (09/19 0353)    Intake/Output from previous day: 09/18 0701 - 09/19 0700 In: 1397.7 [I.V.:1307.7; NG/GT:90] Out: 5800 [Emesis/NG output:2800] Intake/Output this shift: No intake/output data recorded.  GI: soft nontender nondistended  Lab Results:   Recent Labs  10/01/15 2019 10/02/15 0214  WBC 7.9 7.8  HGB 11.9* 11.9*  HCT 35.4* 35.4*  PLT 163 155   BMET  Recent Labs  10/01/15 1040 10/02/15 0214  NA 145 142  K 3.4* 4.6  CL 99* 100*  CO2 33* 31  GLUCOSE 118* 82  BUN 29* 27*  CREATININE 1.18* 1.12*  CALCIUM 8.7* 7.9*   PT/INR No results for input(s): LABPROT, INR in the last 72 hours. ABG No results for input(s): PHART, HCO3 in the last 72 hours.  Invalid input(s): PCO2, PO2  Studies/Results: Ct Abdomen Pelvis W Contrast  Result Date: 10/01/2015 CLINICAL DATA:  Nursing home patient with dementia, presenting with fecal emesis. Evaluate for bowel obstruction. EXAM: CT ABDOMEN AND PELVIS WITH CONTRAST TECHNIQUE: Multidetector CT imaging of the abdomen and pelvis was performed using the standard protocol following bolus administration of intravenous contrast. CONTRAST:  4mL ISOVUE-300 IOPAMIDOL (ISOVUE-300) INJECTION 61% COMPARISON:  None. FINDINGS: Lower chest: The heart is enlarged. There is atherosclerosis of the coronary arteries. No significant pleural or pericardial effusion. No confluent basilar airspace disease. Hepatobiliary: No focal hepatic abnormalities are identified. There is no significant biliary dilatation status post cholecystectomy. Pancreas: Diffusely  atrophied. No evidence of pancreatic mass, ductal dilatation or surrounding inflammation. Spleen: Normal in size without focal abnormality. Adrenals/Urinary Tract: Both adrenal glands appear normal. Both kidneys demonstrate cortical thinning. There are cysts in the lower pole of the right kidney, measuring up to 3.2 cm in diameter. No evidence of renal mass, urinary tract calculus or hydronephrosis. The bladder appears unremarkable for its degree of distention. Stomach/Bowel: Small hiatal hernia. The stomach, proximal and mid small bowel are mildly dilated with multiple air-fluid levels. The distal small bowel is decompressed. The transition point is suspected to be in the false pelvis (axial image 56). No surrounding soft tissue mass or fluid collection is apparent in this area. The colon is decompressed. No apparent fistula or focal extraluminal fluid collection. There is a small amount of free pelvic fluid. Vascular/Lymphatic: There are no enlarged abdominal or pelvic lymph nodes. Patient has an aorto bi-iliac stent graft which appears patent. 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 No evidence of endograft leak. Both internal iliac arteries appear thrombosed status post probable embolization on the right. The external iliac arteries are patent. IVC filter noted with mild strut penetration. Reproductive: Hysterectomy.  No evidence of adnexal mass. Other: There is a small amount of ascites. There is generalized edema throughout the mesenteric and subcutaneous fat. No focal extraluminal fluid collections are seen. Musculoskeletal: No acute or significant osseous findings. The bones are demineralized. There is a possible sacral decubitus ulcer. No evidence of bone destruction. Lower lumbar spondylosis noted. IMPRESSION: 1. Findings are consistent with a mid to distal small  bowel obstruction. Transition point appears to be in the false pelvis without discernible  cause, suggesting adhesions. 2. No fistula or abscess identified. 3. Generalized soft tissue edema with mild pelvic ascites. 4. Patent aortic stent graft. Electronically Signed   By: Carey BullocksWilliam  Veazey M.D.   On: 10/01/2015 13:50   Dg Chest Portable 1 View  Result Date: 10/01/2015 CLINICAL DATA:  Nasogastric tube placement. Decreased oxygen saturation. EXAM: PORTABLE CHEST 1 VIEW COMPARISON:  None. FINDINGS: Nasogastric tube tip and side port are below the diaphragm. No pneumothorax. There is fine nodular interstitial disease throughout the lungs with a mid to lower lung lung zone predominance. There is no airspace consolidation or volume loss. The heart size and pulmonary vascular normal. No adenopathy. There is atherosclerotic calcification in the aorta. There is degenerative change in each shoulder. IMPRESSION: Nasogastric tube tip and side port below the diaphragm. Fine nodular interstitial disease throughout the lungs of uncertain etiology or chronicity. No frank edema or consolidation. Cardiac silhouette within normal limits. There is aortic atherosclerosis. Electronically Signed   By: Bretta BangWilliam  Woodruff III M.D.   On: 10/01/2015 14:21   Dg Abd Portable 1v-small Bowel Obstruction Protocol-initial, 8 Hr Delay  Result Date: 10/02/2015 CLINICAL DATA:  Small bowel obstruction.  8 hour post contrast film. EXAM: PORTABLE ABDOMEN - 1 VIEW COMPARISON:  10/01/2015 FINDINGS: Enteric tube tip is in the right upper quadrant, likely in the distal stomach or duodenum bulb region. Gaseous distention of the stomach. Contrast material is demonstrated throughout the colon suggesting no evidence of complete small bowel obstruction. Vascular stents and coils. Inferior vena caval filter. Surgical clips in the right abdomen. IMPRESSION: Contrast material is present in the colon suggesting no evidence of complete small bowel obstruction. Electronically Signed   By: Burman NievesWilliam  Stevens M.D.   On: 10/02/2015 04:40   Dg Abd Portable  1v-small Bowel Protocol-position Verification  Result Date: 10/01/2015 CLINICAL DATA:  NG tube placement. EXAM: PORTABLE ABDOMEN - 1 VIEW COMPARISON:  CT 10/01/2015 FINDINGS: NG tube is in place with the tip in the perihilar region, possibly distal stomach or proximal duodenum. Side port is in the distal stomach. Decreasing small bowel dilatation. Prior cholecystectomy. No free air. IMPRESSION: NG tube tip in the peripyloric region, possibly distal stomach or proximal duodenum. Decreasing small bowel dilatation. Electronically Signed   By: Charlett NoseKevin  Dover M.D.   On: 10/01/2015 16:21    Anti-infectives: Anti-infectives    None      Assessment/Plan: SBO Resolving with contrast in colon on xray, will dc ng and start clears.   [facility]unter[facility] Center For Surgery LLCWAKEFIELD,Alwin Lanigan 10/02/2015

## 2015-10-02 NOTE — Care Management Note (Signed)
Case Management Note  Patient Details  Name: Brandi Heath MRN: [id number] Date of Birth: Oct 04, 1927  Subjective/Objective:         A.fib and sbo           Action/Plan:  snf   Expected Discharge Date:   (unknown)               Expected Discharge Plan:  Skilled Nursing Facility  In-House Referral:  Clinical Social Work  Discharge planning Services     Post Acute Care Choice:    Choice offered to:     DME Arranged:    DME Agency:     HH Arranged:    HH Agency:     Status of Service:  In process, will continue to follow  If discussed at Long Length of Stay Meetings, dates discussed:    Additional Comments:Date:  October 02, 2015 Chart reviewed for concurrent status and case management needs. Will continue to follow the patient for status change: Discharge Planning: following for needs Expected discharge date: [id number] Marcelle Smiling, BSN, [place], [place]   [phone]  Golda Acre, RN 10/02/2015, 8:39 AM

## 2015-10-02 NOTE — Progress Notes (Addendum)
MEDICATION RELATED CONSULT NOTE - Follow-up  Pharmacy Consult for Phenytoin Indication: hx of seizures  Allergies  Allergen Reactions  . Penicillins     From [facility]    Patient Measurements: Height: 5\' 7"  (170.2 cm) Weight: 150 lb 2.1 oz (68.1 kg) IBW/kg (Calculated) : 61.6  Vital Signs: Temp: 98.7 F (37.1 C) (09/19 0409) Temp Source: Oral (09/19 0409) BP: 132/63 (09/19 0600) Intake/Output from previous day: 09/18 0701 - 09/19 0700 In: 1397.7 [I.V.:1307.7; NG/GT:90] Out: 5800 [Emesis/NG output:2800] Intake/Output from this shift: No intake/output data recorded.  Labs:  Recent Labs  10/01/15 1040 10/01/15 2019 10/02/15 0214  WBC 7.7 7.9 7.8  HGB 12.6 11.9* 11.9*  HCT 36.0 35.4* 35.4*  PLT 175 163 155  CREATININE 1.18*  --  1.12*  ALBUMIN 3.7  --  3.1*  PROT 7.2  --  6.3*  AST 28  --  40  ALT 19  --  15  ALKPHOS 171*  --  137*  BILITOT 0.9  --  0.9   Estimated Creatinine Clearance: 35.1 mL/min (by C-G formula based on SCr of 1.12 mg/dL (H)).   Microbiology: Recent Results (from the past 720 hour(s))  MRSA PCR Screening     Status: None   Collection Time: 10/01/15  5:14 PM  Result Value Ref Range Status   MRSA by PCR NEGATIVE NEGATIVE Final    Comment:        The GeneXpert MRSA Assay (FDA approved for NASAL specimens only), is one component of a comprehensive MRSA colonization surveillance program. It is not intended to diagnose MRSA infection nor to guide or monitor treatment for MRSA infections.      Assessment: 38 y/oF with PMH of seizures, stroke, a-fib who presented on 10/01/15 with n/v x 3-4 days, found to have a small bowel obstruction, a-fib with RVR, and heme positive gastric fluid. Pharmacy consulted to transition patient's phenytoin from PO to IV.   PTA dosage reported as Phenytoin ER 100mg  capsule 2 capsules (for total of 200mg ) PO BID. Last dose reported as 09/30/15. No recent seizure activity noted.   Today, 10/02/15:   Renal: SCr:  1.18, mildly elevated compared to previous value in August   Albumin: 3.1 (dec from admission with IVF)  Phenytoin levels  9/18: 21.3 mcg/mL (corrected 25.36) - last phenytoin dose 9/17   9/19: 15.5 mcg/ml (corrected = 21.53)  Goal of Therapy:  Phenytoin level 10-20 mcg/mL Absence of seizures  Plan:   Phenytoin level (corrected for albumin) remains slightly supratherapeutic but trending down  Based on supratherapeutic level: reduce phenytoin to 100mg  qam and 200mg  qhs - resume phenytoin tonight (9/19) for level remaining supratherapeutic this am  Suggest repeat phenytoin level and albumin in 3-5 days  Juliette Alcide, PharmD, BCPS.   Pager: [phone] 10/02/2015 7:51 AM

## 2015-10-02 NOTE — Progress Notes (Signed)
Initial Nutrition Assessment  DOCUMENTATION CODES:   Not applicable  INTERVENTION:  - Continue CLD and advance diet as medically feasible. - RD will monitor for needs at follow-up.  NUTRITION DIAGNOSIS:   Inadequate oral intake related to acute illness, nausea, vomiting as evidenced by per patient/family report.  GOAL:   Patient will meet greater than or equal to 90% of their needs  MONITOR:   PO intake, Diet advancement, Weight trends, Labs, I & O's  REASON FOR ASSESSMENT:   Malnutrition Screening Tool  ASSESSMENT:   82 y.o. female with a past medical history of dementia, previous episodes of small bowel obstruction, atrial fibrillation on anticoagulation, history of PE and DVT, history of stroke, history of seizure disorder who was in her usual state of health at her nursing facility, [facility], until this past Thursday and Friday when she started having nausea followed by vomiting. Patient is a very limited historian. She does have a history of dementia. She apparently did okay on Saturday, but then started vomiting again on Sunday. Sunday evening, the nursing facility, got a x-ray done, which showed evidence for obstruction, according to the daughter. The patient got worse overnight and she was brought to the ED. There has been no history of any abdominal discomfort.  Pt seen for MST. BMI indicates normal weight. Pt was NPO and advanced to CLD at ~0730 this AM. RN reports that pt took a small amount of water with pills and a sip of orange juice, no other intakes following diet advancement. Pt sleeping soundly at time of RD visit and did not arouse to name call x3 or during physical assessment. No family/visitors present at this time. NGT previously in place and removed at time of diet advancement. Per notes, CT confirmed SBO. Notes indicate that pt had another SBO 3-4 months prior to this admission.   Physical assessment shows no muscle or fat wasting; severe edema to BLE. Per  chart review, pt has lost 6 lbs (3.8% body weight) in the past 5 weeks which is not significant for time frame. Will continue to monitor for needs as PO intakes improve.   Medications reviewed; 10 mg oral Reglan BID, PRN Zofran, 40 mg oral Protonix BID. Labs reviewed; Cl: 100 mmol/L, BUN: 27 mg/dL, creatinine: 5.49 mg/dL, Ca: 7.9 mg/dL, GFR: 50 mL/min.  IVF: NS @ 75 mL/hr.    Diet Order:  Diet clear liquid Room service appropriate? Yes; Fluid consistency: Thin  Skin:  Reviewed, no issues  Last BM:  PTA  Height:   Ht Readings from Last 1 Encounters:  10/01/15 5\' 7"  (1.702 m)    Weight:   Wt Readings from Last 1 Encounters:  10/02/15 150 lb 2.1 oz (68.1 kg)    Ideal Body Weight:  61.36 kg  BMI:  Body mass index is 23.51 kg/m.  Estimated Nutritional Needs:   Kcal:  1350-1550  Protein:  55-65 grams  Fluid:  >/= 1.7 L/day  EDUCATION NEEDS:   No education needs identified at this time    Trenton Gammon, MS, RD, LDN Inpatient Clinical Dietitian Pager # [phone] After hours/weekend pager # [phone]

## 2015-10-02 NOTE — Assessment & Plan Note (Signed)
good

## 2015-10-02 NOTE — Progress Notes (Signed)
PT Cancellation Note  Patient Details Name: Brandi Heath MRN: [id number] DOB: [birth year]   Cancelled Treatment:    Reason Eval/Treat Not Completed: Patient not medically ready (on Cardiazem drip. Per RN, not ready. Patient is from SNF, unknown prior level of function. )   Rada Hay 10/02/2015, 9:00 AM Blanchard Kelch PT [phone]

## 2015-10-03 ENCOUNTER — Inpatient Hospital Stay (HOSPITAL_COMMUNITY): Payer: Medicare Other

## 2015-10-03 DIAGNOSIS — Z86718 Personal history of other venous thrombosis and embolism: Secondary | ICD-10-CM

## 2015-10-03 DIAGNOSIS — Z86711 Personal history of pulmonary embolism: Secondary | ICD-10-CM

## 2015-10-03 DIAGNOSIS — F0391 Unspecified dementia with behavioral disturbance: Secondary | ICD-10-CM

## 2015-10-03 DIAGNOSIS — Z8673 Personal history of transient ischemic attack (TIA), and cerebral infarction without residual deficits: Secondary | ICD-10-CM

## 2015-10-03 LAB — BASIC METABOLIC PANEL
BUN: 22 mg/dL — AB (ref 4–21)
Creatinine: 0.9 mg/dL (ref 0.5–1.1)
Glucose: 103 mg/dL
Potassium: 2.9 mmol/L — AB (ref 3.4–5.3)
SODIUM: 143 mmol/L (ref 137–147)

## 2015-10-03 LAB — CBC WITH DIFFERENTIAL/PLATELET
BASOS PCT: 0 %
Basophils Absolute: 0 10*3/uL (ref 0.0–0.1)
EOS ABS: 0.1 10*3/uL (ref 0.0–0.7)
Eosinophils Relative: 1 %
HCT: 29.2 % — ABNORMAL LOW (ref 36.0–46.0)
Hemoglobin: 9.7 g/dL — ABNORMAL LOW (ref 12.0–15.0)
Lymphocytes Relative: 17 %
Lymphs Abs: 1.1 10*3/uL (ref 0.7–4.0)
MCH: 29.3 pg (ref 26.0–34.0)
MCHC: 33.2 g/dL (ref 30.0–36.0)
MCV: 88.2 fL (ref 78.0–100.0)
MONOS PCT: 12 %
Monocytes Absolute: 0.8 10*3/uL (ref 0.1–1.0)
NEUTROS ABS: 4.6 10*3/uL (ref 1.7–7.7)
NEUTROS PCT: 70 %
PLATELETS: 135 10*3/uL — AB (ref 150–400)
RBC: 3.31 MIL/uL — ABNORMAL LOW (ref 3.87–5.11)
RDW: 15.5 % (ref 11.5–15.5)
WBC: 6.6 10*3/uL (ref 4.0–10.5)

## 2015-10-03 LAB — COMPREHENSIVE METABOLIC PANEL
ALBUMIN: 2.8 g/dL — AB (ref 3.5–5.0)
ALT: 12 U/L — ABNORMAL LOW (ref 14–54)
ANION GAP: 5 (ref 5–15)
AST: 19 U/L (ref 15–41)
Alkaline Phosphatase: 111 U/L (ref 38–126)
BUN: 22 mg/dL — ABNORMAL HIGH (ref 6–20)
CALCIUM: 7.9 mg/dL — AB (ref 8.9–10.3)
CHLORIDE: 106 mmol/L (ref 101–111)
CO2: 32 mmol/L (ref 22–32)
Creatinine, Ser: 0.93 mg/dL (ref 0.44–1.00)
GFR calc non Af Amer: 54 mL/min — ABNORMAL LOW (ref >60)
GLUCOSE: 103 mg/dL — AB (ref 65–99)
POTASSIUM: 2.9 mmol/L — AB (ref 3.5–5.1)
SODIUM: 143 mmol/L (ref 135–145)
Total Bilirubin: 0.7 mg/dL (ref 0.3–1.2)
Total Protein: 5.4 g/dL — ABNORMAL LOW (ref 6.5–8.1)

## 2015-10-03 LAB — CBC AND DIFFERENTIAL
HEMATOCRIT: 29 % — AB (ref 36–46)
HEMOGLOBIN: 9.7 g/dL — AB (ref 12.0–16.0)
PLATELETS: 135 10*3/uL — AB (ref 150–399)
WBC: 6.6 10^3/mL

## 2015-10-03 LAB — MAGNESIUM: Magnesium: 2.1 mg/dL (ref 1.7–2.4)

## 2015-10-03 MED ORDER — RISPERIDONE 1 MG PO TBDP
1.0000 mg | ORAL_TABLET | Freq: Two times a day (BID) | ORAL | Status: DC
  Administered 2015-10-03 (×2): 1 mg via ORAL
  Filled 2015-10-03 (×3): qty 1

## 2015-10-03 MED ORDER — METOPROLOL TARTRATE 5 MG/5ML IV SOLN
2.5000 mg | Freq: Four times a day (QID) | INTRAVENOUS | Status: DC
  Administered 2015-10-03 – 2015-10-04 (×3): 2.5 mg via INTRAVENOUS
  Filled 2015-10-03 (×3): qty 5

## 2015-10-03 MED ORDER — STROKE: EARLY STAGES OF RECOVERY BOOK: Freq: Once | Status: DC

## 2015-10-03 MED ORDER — POTASSIUM CHLORIDE CRYS ER 20 MEQ PO TBCR
40.0000 meq | EXTENDED_RELEASE_TABLET | ORAL | Status: DC
Start: 2015-10-03 — End: 2015-10-03

## 2015-10-03 MED ORDER — KCL IN DEXTROSE-NACL 40-5-0.45 MEQ/L-%-% IV SOLN
INTRAVENOUS | Status: DC
  Administered 2015-10-03 – 2015-10-04 (×2): via INTRAVENOUS
  Filled 2015-10-03 (×2): qty 1000

## 2015-10-03 MED ORDER — POTASSIUM CHLORIDE 10 MEQ/100ML IV SOLN
10.0000 meq | INTRAVENOUS | Status: AC
  Administered 2015-10-03 (×2): 10 meq via INTRAVENOUS
  Filled 2015-10-03: qty 100

## 2015-10-03 MED ORDER — FAMOTIDINE IN NACL 20-0.9 MG/50ML-% IV SOLN
20.0000 mg | Freq: Two times a day (BID) | INTRAVENOUS | Status: DC
  Administered 2015-10-03 – 2015-10-05 (×5): 20 mg via INTRAVENOUS
  Filled 2015-10-03 (×5): qty 50

## 2015-10-03 MED ORDER — ASPIRIN 300 MG RE SUPP: 300.0000 mg | Freq: Every day | RECTAL | Status: DC

## 2015-10-03 MED ORDER — DILTIAZEM HCL-DEXTROSE 100-5 MG/100ML-% IV SOLN (PREMIX)
5.0000 mg/h | INTRAVENOUS | Status: DC
  Administered 2015-10-03: 5 mg/h via INTRAVENOUS
  Administered 2015-10-04: 15 mg/h via INTRAVENOUS
  Filled 2015-10-03 (×4): qty 100

## 2015-10-03 MED ORDER — ASPIRIN 300 MG RE SUPP
300.0000 mg | RECTAL | Status: AC
  Administered 2015-10-03: 300 mg via RECTAL
  Filled 2015-10-03: qty 1

## 2015-10-03 MED ORDER — PHENYTOIN SODIUM 50 MG/ML IJ SOLN
100.0000 mg | Freq: Three times a day (TID) | INTRAMUSCULAR | Status: DC
  Administered 2015-10-03 – 2015-10-04 (×3): 100 mg via INTRAVENOUS
  Filled 2015-10-03 (×3): qty 2

## 2015-10-03 NOTE — Progress Notes (Signed)
PROGRESS NOTE  Brandi Heath  ZOX:[id number] DOB: 18-Oct-1927 DOA: 10/01/2015 PCP: Merrilee Seashore, MD  Brief Narrative:  Brandi Heath a 82 y.o.femalewith a past medical history of dementia, previous episodes of small bowel obstruction, atrial fibrillation on anticoagulation, history of PE and DVT, history of stroke, history of seizure disorder who was in her usual state of health at her nursing facility, Brandi Heath this past Thursday and Friday when she started having nausea followed by vomiting.  She was admitted with small bowel obstruction secondary to adhesions from previous surgery.  She was also in Afib with RVR and started on IV diltiazem infusion.  She was converted to oral diltiazem.  On 9/20 she developed sudden onset numbness of the left extremities.  MRI was negative for stroke.    Assessment & Plan:   Principal Problem:   SBO (small bowel obstruction) (HCC) Active Problems:   Dementia with behavioral disturbance   Atrial fibrillation with RVR (HCC)   Personal history of PE (pulmonary embolism)   History of CVA (cerebrovascular accident)   Personal history of DVT (deep vein thrombosis)   Chronic CHF (congestive heart failure) (HCC)  Sudden left sided numbness and worsening slurred speech in setting of a-fib while a/c was being held for SBO, acute anemia and occult positive. -  Aspirin rectal 300mg  daily -  If no evidence of active bleeding, will resume anticoagulation tomorrow -  Failed swallow screen -  NPO with IV medications pending swallow eval -  MRI/MRA brain:  No evidence of acute stroke -  SLP, PT/OT assessments -  Neuro checks x 24 hours -  Neurology consultation -  UA and urine culture -  MAR reviewed and no obvious sedating medications were given this morning.  Small bowel obstruction resolved with conservative measures.  She was followed by general surgery.  Her NG tube was removed on 9/19.    Atrial fibrillation with RVR: She has a long-standing  history of atrial fibrillation. She is on Apixabanat home.  She initially required diltiazem infusion.   -  Resume dilt gtt while NPO today -  TSH 2.030 wnl - ECHO December at [facility] with EF of 50-55%. Valvular disease was noted, but none of them appeared to be severe at that time.  - start IV metoprolol (home dose may need to be increased) -  May need to trial of digoxin if tachycardia is persistent  Heme positive gastric fluid: No overt hematemesis has been noted.  2 point drop in hgb today. -  Change to IV famotidine -  Repeat CBC in AM  Dementia: Stable. Monitor closely.  Seizure disorder on Dilantin.  -  Change to IV phenytoin pending swallow evaluation  History of RLE DVT and PE: Not an active issue per family. She does have lower extremity edema, which is getting better per family since she was placed on Lasix and the dose was increased recently. Continue to monitor for now.  Probable CKD stage 3, baseline creatinine 0.9 to 1.3.  Creatinine marginally improved -  avoid nephrotoxins, renally dose medications.  Hypokalemia -  Add potassium to IVF -  Check mag:  wnl -  Repeat K in AM.  DVT Prophylaxis:Only SCDs for now Code Status:DO NOT RESUSCITATE Family Communication:Discussed with the patient's 1 daughters Disposition Plan:Stepdown pending eval for stroke  Consultants:   Neurology  General surgery  Procedures:  none  Antimicrobials:   none    Subjective: Felt well this morning and ate a good breakfast.  Passing flatus.  Having worsening slurred speech.  Feels numb on her left face, arm and leg which is new compared to this morning.  Stable weakness of the left extremities due to previous stroke.  Objective: Vitals:   10/02/15 1200 10/02/15 1546 10/02/15 2209 10/03/15 0636  BP:  117/63 116/76 102/63  Pulse:  (!) 112 96 (!) 110  Resp:  20 20 20   Temp: 98.3 F (36.8 C) 98.1 F (36.7 C) 99.4 F (37.4 C) 99.9 F (37.7 C)  TempSrc: Oral  Oral Oral Oral  SpO2: 97% 97% 100% 96%  Weight:    66.8 kg (147 lb 4.3 oz)  Height:        Intake/Output Summary (Last 24 hours) at 10/03/15 1305 Last data filed at 10/03/15 0849  Gross per 24 hour  Intake           2521.5 ml  Output                0 ml  Net           2521.5 ml   Filed Weights   10/01/15 1720 10/02/15 0353 10/03/15 0636  Weight: 69.6 kg (153 lb 7 oz) 68.1 kg (150 lb 2.1 oz) 66.8 kg (147 lb 4.3 oz)    Examination:  General exam:  Adult female, initially asleep but aroused to loud voice, light touch.  No acute distress.  HEENT:  NCAT, MMM Respiratory system: Clear to auscultation bilaterally Cardiovascular system:  Tachycardic, IRRR, normal S1/S2. 2/6 systolic murmur.  No gallops or clicks.  Warm extremities Gastrointestinal system: Normal active bowel sounds, soft, nondistended, nontender. MSK:  Normal tone and bulk, no lower extremity edema Neuro:  4/5 strength left upper and lower extremities.  Mild dysmetria left extremities.  No obvious facial droop.  Decreased sensation on the left face and leg.  Possible decreased sensation on the left arm.  Drift on the left extremities.  No obvious field cuts.  No hemineglect.      Data Reviewed: I have personally reviewed following labs and imaging studies  CBC:  Recent Labs Lab 10/01/15 1040 10/01/15 2019 10/02/15 0214 10/03/15 0415  WBC 7.7 7.9 7.8 6.6  NEUTROABS 5.7  --   --  4.6  HGB 12.6 11.9* 11.9* 9.7*  HCT 36.0 35.4* 35.4* 29.2*  MCV 84.7 87.2 87.4 88.2  PLT 175 163 155 135*   Basic Metabolic Panel:  Recent Labs Lab 10/01/15 1040 10/02/15 0214 10/03/15 0415  NA 145 142 143  K 3.4* 4.6 2.9*  CL 99* 100* 106  CO2 33* 31 32  GLUCOSE 118* 82 103*  BUN 29* 27* 22*  CREATININE 1.18* 1.12* 0.93  CALCIUM 8.7* 7.9* 7.9*  MG  --   --  2.1   GFR: Estimated Creatinine Clearance: 42.2 mL/min (by C-G formula based on SCr of 0.93 mg/dL). Liver Function Tests:  Recent Labs Lab 10/01/15 1040  10/02/15 0214 10/03/15 0415  AST 28 40 19  ALT 19 15 12*  ALKPHOS 171* 137* 111  BILITOT 0.9 0.9 0.7  PROT 7.2 6.3* 5.4*  ALBUMIN 3.7 3.1* 2.8*    Recent Labs Lab 10/01/15 1040  LIPASE 34   No results for input(s): AMMONIA in the last 168 hours. Coagulation Profile: No results for input(s): INR, PROTIME in the last 168 hours. Cardiac Enzymes: No results for input(s): CKTOTAL, CKMB, CKMBINDEX, TROPONINI in the last 168 hours. BNP (last 3 results) No results for input(s): PROBNP in the last 8760 hours. HbA1C: No  results for input(s): HGBA1C in the last 72 hours. CBG: No results for input(s): GLUCAP in the last 168 hours. Lipid Profile: No results for input(s): CHOL, HDL, LDLCALC, TRIG, CHOLHDL, LDLDIRECT in the last 72 hours. Thyroid Function Tests:  Recent Labs  10/02/15 0214  TSH 2.030   Anemia Panel: No results for input(s): VITAMINB12, FOLATE, FERRITIN, TIBC, IRON, RETICCTPCT in the last 72 hours. Urine analysis:    Component Value Date/Time   COLORURINE YELLOW 10/01/2015 1022   APPEARANCEUR CLEAR 10/01/2015 1022   LABSPEC 1.041 (H) 10/01/2015 1022   PHURINE 7.5 10/01/2015 1022   GLUCOSEU NEGATIVE 10/01/2015 1022   HGBUR NEGATIVE 10/01/2015 1022   BILIRUBINUR SMALL (A) 10/01/2015 1022   KETONESUR NEGATIVE 10/01/2015 1022   PROTEINUR NEGATIVE 10/01/2015 1022   NITRITE NEGATIVE 10/01/2015 1022   LEUKOCYTESUR NEGATIVE 10/01/2015 1022   Sepsis Labs: @LABRCNTIP (procalcitonin:4,lacticidven:4)  ) Recent Results (from the past 240 hour(s))  MRSA PCR Screening     Status: None   Collection Time: 10/01/15  5:14 PM  Result Value Ref Range Status   MRSA by PCR NEGATIVE NEGATIVE Final    Comment:        The GeneXpert MRSA Assay (FDA approved for NASAL specimens only), is one component of a comprehensive MRSA colonization surveillance program. It is not intended to diagnose MRSA infection nor to guide or monitor treatment for MRSA infections.        Radiology Studies: Mr Shirlee Latch WJ Contrast  Result Date: 10/03/2015 CLINICAL DATA:  Code stroke. Acute mental status changes beginning 90 minutes ago. Lethargy. Left-sided numbness. EXAM: MRI HEAD WITHOUT CONTRAST MRA HEAD WITHOUT CONTRAST TECHNIQUE: Multiplanar, multiecho pulse sequences of the brain and surrounding structures were obtained without intravenous contrast. Angiographic images of the head were obtained using MRA technique without contrast. COMPARISON:  None. FINDINGS: MRI HEAD FINDINGS Brain: Diffusion imaging does not show any acute or subacute infarction. The brainstem is normal. There is cerebellar atrophy. Cerebral hemispheres show generalized age related atrophy with chronic small-vessel ischemic change of the deep white matter. No large vessel territory infarction. No mass lesion, hemorrhage, hydrocephalus or extra-axial collection. No pituitary mass. Vascular: Major vessels are patent at the base of the brain. Skull and upper cervical spine: Negative Sinuses/Orbits: Small mastoid effusions. Other: None significant MRA HEAD FINDINGS Both internal carotid arteries are widely patent through the skullbase. On the left, the anterior and middle cerebral vessels are patent without proximal stenosis, aneurysm or vascular malformation. More distal branch vessels show atherosclerotic irregularity. On the right, the anterior cerebral branches are patent. There is what I believe is artifactual signal loss within the right middle cerebral artery territory related to downward tortuosity. In evaluating the source images, that vessel appears to be widely patent. Both vertebral arteries are widely patent to the basilar. No basilar stenosis. Posterior circulation branch vessels are normal. IMPRESSION: No acute infarction. Generalized age related atrophy and chronic small vessel change of the cerebral hemispheric deep white matter. Intracranial MR angiography shows patency of the large and medium size  vessels. There is artifactual signal loss within the right middle cerebral artery related to downward tortuosity. Evaluation of the source images shows that vessel to be patent. The patient does have some distal vessel atherosclerotic irregularity. Electronically Signed   By: Paulina Fusi M.D.   On: 10/03/2015 12:15   Mr Brain Wo Contrast  Result Date: 10/03/2015 CLINICAL DATA:  Code stroke. Acute mental status changes beginning 90 minutes ago. Lethargy. Left-sided numbness. EXAM: MRI HEAD  WITHOUT CONTRAST MRA HEAD WITHOUT CONTRAST TECHNIQUE: Multiplanar, multiecho pulse sequences of the brain and surrounding structures were obtained without intravenous contrast. Angiographic images of the head were obtained using MRA technique without contrast. COMPARISON:  None. FINDINGS: MRI HEAD FINDINGS Brain: Diffusion imaging does not show any acute or subacute infarction. The brainstem is normal. There is cerebellar atrophy. Cerebral hemispheres show generalized age related atrophy with chronic small-vessel ischemic change of the deep white matter. No large vessel territory infarction. No mass lesion, hemorrhage, hydrocephalus or extra-axial collection. No pituitary mass. Vascular: Major vessels are patent at the base of the brain. Skull and upper cervical spine: Negative Sinuses/Orbits: Small mastoid effusions. Other: None significant MRA HEAD FINDINGS Both internal carotid arteries are widely patent through the skullbase. On the left, the anterior and middle cerebral vessels are patent without proximal stenosis, aneurysm or vascular malformation. More distal branch vessels show atherosclerotic irregularity. On the right, the anterior cerebral branches are patent. There is what I believe is artifactual signal loss within the right middle cerebral artery territory related to downward tortuosity. In evaluating the source images, that vessel appears to be widely patent. Both vertebral arteries are widely patent to the  basilar. No basilar stenosis. Posterior circulation branch vessels are normal. IMPRESSION: No acute infarction. Generalized age related atrophy and chronic small vessel change of the cerebral hemispheric deep white matter. Intracranial MR angiography shows patency of the large and medium size vessels. There is artifactual signal loss within the right middle cerebral artery related to downward tortuosity. Evaluation of the source images shows that vessel to be patent. The patient does have some distal vessel atherosclerotic irregularity. Electronically Signed   By: Paulina Fusi M.D.   On: 10/03/2015 12:15   Ct Abdomen Pelvis W Contrast  Result Date: 10/01/2015 CLINICAL DATA:  Nursing home patient with dementia, presenting with fecal emesis. Evaluate for bowel obstruction. EXAM: CT ABDOMEN AND PELVIS WITH CONTRAST TECHNIQUE: Multidetector CT imaging of the abdomen and pelvis was performed using the standard protocol following bolus administration of intravenous contrast. CONTRAST:  75mL ISOVUE-300 IOPAMIDOL (ISOVUE-300) INJECTION 61% COMPARISON:  None. FINDINGS: Lower chest: The heart is enlarged. There is atherosclerosis of the coronary arteries. No significant pleural or pericardial effusion. No confluent basilar airspace disease. Hepatobiliary: No focal hepatic abnormalities are identified. There is no significant biliary dilatation status post cholecystectomy. Pancreas: Diffusely atrophied. No evidence of pancreatic mass, ductal dilatation or surrounding inflammation. Spleen: Normal in size without focal abnormality. Adrenals/Urinary Tract: Both adrenal glands appear normal. Both kidneys demonstrate cortical thinning. There are cysts in the lower pole of the right kidney, measuring up to 3.2 cm in diameter. No evidence of renal mass, urinary tract calculus or hydronephrosis. The bladder appears unremarkable for its degree of distention. Stomach/Bowel: Small hiatal hernia. The stomach, proximal and mid small  bowel are mildly dilated with multiple air-fluid levels. The distal small bowel is decompressed. The transition point is suspected to be in the false pelvis (axial image 56). No surrounding soft tissue mass or fluid collection is apparent in this area. The colon is decompressed. No apparent fistula or focal extraluminal fluid collection. There is a small amount of free pelvic fluid. Vascular/Lymphatic: There are no enlarged abdominal or pelvic lymph nodes. Patient has an aorto bi-iliac stent graft which appears patent. 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 No evidence of endograft leak. Both internal iliac arteries appear thrombosed status post probable embolization on the right.  The external iliac arteries are patent. IVC filter noted with mild strut penetration. Reproductive: Hysterectomy.  No evidence of adnexal mass. Other: There is a small amount of ascites. There is generalized edema throughout the mesenteric and subcutaneous fat. No focal extraluminal fluid collections are seen. Musculoskeletal: No acute or significant osseous findings. The bones are demineralized. There is a possible sacral decubitus ulcer. No evidence of bone destruction. Lower lumbar spondylosis noted. IMPRESSION: 1. Findings are consistent with a mid to distal small bowel obstruction. Transition point appears to be in the false pelvis without discernible cause, suggesting adhesions. 2. No fistula or abscess identified. 3. Generalized soft tissue edema with mild pelvic ascites. 4. Patent aortic stent graft. Electronically Signed   By: Carey Bullocks M.D.   On: 10/01/2015 13:50   Dg Chest Port 1 View  Result Date: 10/03/2015 CLINICAL DATA:  Abnormal breath sounds. EXAM: PORTABLE CHEST 1 VIEW COMPARISON:  10/01/2015. FINDINGS: Cardiomegaly with pulmonary vascular prominence. Mild right perihilar interstitial prominence noted. Mild interstitial edema and/or pneumonitis cannot be  excluded. Left pleural effusion. No pneumothorax IMPRESSION: 1.  Interim removal of NG tube. 2. Cardiomegaly with mild pulmonary venous congestion. Mild right perihilar interstitial prominence. Mild perihilar pulmonary edema and/or pneumonitis cannot be excluded. Electronically Signed   By: Maisie Fus  Register   On: 10/03/2015 11:21   Dg Chest Portable 1 View  Result Date: 10/01/2015 CLINICAL DATA:  Nasogastric tube placement. Decreased oxygen saturation. EXAM: PORTABLE CHEST 1 VIEW COMPARISON:  None. FINDINGS: Nasogastric tube tip and side port are below the diaphragm. No pneumothorax. There is fine nodular interstitial disease throughout the lungs with a mid to lower lung lung zone predominance. There is no airspace consolidation or volume loss. The heart size and pulmonary vascular normal. No adenopathy. There is atherosclerotic calcification in the aorta. There is degenerative change in each shoulder. IMPRESSION: Nasogastric tube tip and side port below the diaphragm. Fine nodular interstitial disease throughout the lungs of uncertain etiology or chronicity. No frank edema or consolidation. Cardiac silhouette within normal limits. There is aortic atherosclerosis. Electronically Signed   By: Bretta Bang III M.D.   On: 10/01/2015 14:21   Dg Abd Portable 1v-small Bowel Obstruction Protocol-initial, 8 Hr Delay  Result Date: 10/02/2015 CLINICAL DATA:  Small bowel obstruction.  8 hour post contrast film. EXAM: PORTABLE ABDOMEN - 1 VIEW COMPARISON:  10/01/2015 FINDINGS: Enteric tube tip is in the right upper quadrant, likely in the distal stomach or duodenum bulb region. Gaseous distention of the stomach. Contrast material is demonstrated throughout the colon suggesting no evidence of complete small bowel obstruction. Vascular stents and coils. Inferior vena caval filter. Surgical clips in the right abdomen. IMPRESSION: Contrast material is present in the colon suggesting no evidence of complete small bowel  obstruction. Electronically Signed   By: Burman Nieves M.D.   On: 10/02/2015 04:40   Dg Abd Portable 1v-small Bowel Protocol-position Verification  Result Date: 10/01/2015 CLINICAL DATA:  NG tube placement. EXAM: PORTABLE ABDOMEN - 1 VIEW COMPARISON:  CT 10/01/2015 FINDINGS: NG tube is in place with the tip in the perihilar region, possibly distal stomach or proximal duodenum. Side port is in the distal stomach. Decreasing small bowel dilatation. Prior cholecystectomy. No free air. IMPRESSION: NG tube tip in the peripyloric region, possibly distal stomach or proximal duodenum. Decreasing small bowel dilatation. Electronically Signed   By: Charlett Nose M.D.   On: 10/01/2015 16:21     Scheduled Meds: .  stroke: mapping our early stages of recovery  book   Does not apply Once  . [START ON 10/04/2015] aspirin  300 mg Rectal Daily  . atorvastatin  10 mg Oral q1800  . donepezil  10 mg Oral QHS  . DULoxetine  60 mg Oral QHS  . latanoprost  1 drop Both Eyes QHS  . metoCLOPramide  10 mg Oral BID  . metoprolol  2.5 mg Intravenous Q6H  . pantoprazole  40 mg Oral BID  . phenytoin (DILANTIN) IV  100 mg Intravenous Q8H  . potassium chloride  40 mEq Oral Q4H  . risperiDONE  1 mg Oral BID  . sodium chloride flush  3 mL Intravenous Q12H   Continuous Infusions: . sodium chloride 75 mL/hr at 10/03/15 0738  . diltiazem (CARDIZEM) infusion       LOS: 2 days    Time spent: 30 min    Renae Fickle, MD [facility] Pager [phone]  If 7PM-7AM, please contact night-coverage [email or web address] Password [email or web address] 10/03/2015, 1:05 PM

## 2015-10-03 NOTE — Progress Notes (Addendum)
Rapid Response Event Note  Overview: Code Stroke called on patient in room 1432 at 1040am. Per primary nurse, patient had been at her baseline neurological function (hx of dementia) this morning around 8am. Patient was eating breakfast and was able to answer questions appropriatley. Around 1030 during rounds with Hospitalist, pt was lethargic and slightly difficult to arouse. Once patient was aroused, she complained of left sided weakness with tingling sensation on the left side of her face and left arm. Code Stroke activated. Neurologist on call contacted by Dr. Malachi Bonds. STAT MRI of head ordered.     Initial Focused Assessment: On assessment patient speech slurred, generalized weakness but more weakness noted on the left side (hx of previous CVA). Pupils equal and reactive. AFIB RVR 110-140S (hx of a.fib). BP 108/76.    Interventions: Patient transferred to SDU per MD order. Awaiting to transport patient to MRI (there is a patient currently in MRI scanner, MRI department will be ready for patient around 1130am).    1130- Pt. Taken to MRI department for MRI of head.  This RN spoke with Neurologist on the phone on the way to MRI department. Will call neurologist directly at [phone] once results are available.          Brandi Heath

## 2015-10-03 NOTE — Progress Notes (Signed)
PT Cancellation Note  Patient Details Name: Brandi Heath MRN: [id number] DOB: Aug 21, 1927   Cancelled Treatment:    Reason Eval/Treat Not Completed: Medical issues which prohibited therapy Pt just taken to ICU.   Resean Brander,KATHrine E 10/03/2015, 11:19 AM Zenovia Jarred, PT, DPT 10/03/2015 Pager: [phone]

## 2015-10-03 NOTE — Evaluation (Signed)
Clinical/Bedside Swallow Evaluation Patient Details  Name: Brandi Heath MRN: [id number] Date of Birth: 1927-09-26  Today's Date: 10/03/2015 Time: SLP Start Time (ACUTE ONLY): 1530 SLP Stop Time (ACUTE ONLY): 1549 SLP Time Calculation (min) (ACUTE ONLY): 19 min  Past Medical History:  Past Medical History:  Diagnosis Date  . Atrial fibrillation (HCC)   . Clotting disorder (HCC)   . Coronary artery disease   . Dementia without behavioral disturbance   . DVT (deep venous thrombosis) (HCC)   . Hyperlipidemia   . Hypertension   . OSA (obstructive sleep apnea)   . Polyneuropathy (HCC)   . Seizures (HCC)   . Stroke [facility])    Past Surgical History:  Past Surgical History:  Procedure Laterality Date  . ABDOMINAL AORTIC ANEURYSM REPAIR    . ABDOMINAL HYSTERECTOMY    . CHOLECYSTECTOMY    . SPINAL FUSION  2003   HPI:  Brandi Heath is a 82 y.o. female with a past medical history of dementia, previous episodes of small bowel obstruction, atrial fibrillation on anticoagulation, history of PE and DVT, history of stroke, history of seizure disorder who was in her usual state of health at her nursing facility, [facility], till this past Thursday and Friday per MD nte.  Last week she started having nausea followed by vomiting.  She was admitted with small bowel obstruction secondary to adhesions from previous surgery.  She was also in Afib with RVR and started on IV diltiazem infusion.  She was converted to oral diltiazem.  On 9/20 she developed sudden onset numbness of the left extremities.  MRI was negative for stroke.  Pt failed RNSSS and SLP swallow eval ordered.     Assessment / Plan / Recommendation Clinical Impression  Pt presents with functional oropharyngeal swallow based on clinical swallow evaluation. Suspect baseline facial and hypoglossal nerve deficits from prior cva.  No indication of airway compromise with intake and swallow was timely with adequate laryngeal elevation observed  clinically. Pt admits to occasional difficulties with liquids but not consistently.  Suspect possible minimal reversible difficulties from NG placement.    Pt is edentulous but reports she eats regular foods without difficulties.  Recommend advance diet as tolerated.  Advised pt to alternative ways to take medications if difficult to swallow.  NO slp follow up indicated, Thanks for this order.      Aspiration Risk    mild   Diet Recommendation Regular;Thin liquid   Liquid Administration via: Cup;Straw Medication Administration: Whole meds with liquid (defer to pt) Supervision: Patient able to self feed Compensations: Slow rate;Small sips/bites Postural Changes: Remain upright for at least 30 minutes after po intake;Seated upright at 90 degrees    Other  Recommendations Oral Care Recommendations: Oral care BID   Follow up Recommendations None      Frequency and Duration            Prognosis        Swallow Study   General Date of Onset: 10/03/15 HPI: Brandi Heath is a 82 y.o. female with a past medical history of dementia, previous episodes of small bowel obstruction, atrial fibrillation on anticoagulation, history of PE and DVT, history of stroke, history of seizure disorder who was in her usual state of health at her nursing facility, [facility], till this past Thursday and Friday per MD nte.  Last week she started having nausea followed by vomiting.  She was admitted with small bowel obstruction secondary to adhesions from previous surgery.  She was  also in Afib with RVR and started on IV diltiazem infusion.  She was converted to oral diltiazem.  On 9/20 she developed sudden onset numbness of the left extremities.  MRI was negative for stroke.  Pt failed RNSSS and SLP swallow eval ordered.   Type of Study: Bedside Swallow Evaluation Previous Swallow Assessment: per snf forms, pt does not have dysphagia prior to admit Diet Prior to this Study: NPO Temperature Spikes Noted:  No Respiratory Status: Room air History of Recent Intubation: No Behavior/Cognition: Alert;Cooperative;Pleasant mood Oral Cavity Assessment: Within Functional Limits Oral Care Completed by SLP: No Oral Cavity - Dentition: Edentulous;Dentures, not available (pt does not routinely wear dentures for po per her statement) Vision: Functional for self-feeding Self-Feeding Abilities: Needs set up Patient Positioning: Upright in bed Baseline Vocal Quality: Normal Volitional Cough: Strong Volitional Swallow: Able to elicit    Oral/Motor/Sensory Function Overall Oral Motor/Sensory Function: Other (comment) (lingual deviation left slightly upon protrusion, decr facial left slight, no other deficits)   Ice Chips Ice chips: Not tested   Thin Liquid Thin Liquid: Within functional limits Presentation: Cup;Self Fed;Straw    Nectar Thick Nectar Thick Liquid: Not tested   Honey Thick Honey Thick Liquid: Not tested   Puree Puree: Not tested   Solid   GO   Solid: Within functional limits Presentation: Self Brandi PickFed        Brandi Horan, MS [facility] SLP [phone]

## 2015-10-03 NOTE — Progress Notes (Signed)
Patient ID: Brandi Heath, female   DOB: 05/12/1927, 82 y.o.   MRN: [id number]  [facility] Surgery Progress Note     Subjective: Up eating breakfast this morning. States that her abdomen is a little sore but no pain. Tolerating diet. Denies n/v. Had BM this morning.  Objective: Vital signs in last 24 hours: Temp:  [97.9 F (36.6 C)-99.9 F (37.7 C)] 99.9 F (37.7 C) (09/20 0636) Pulse Rate:  [96-112] 110 (09/20 0636) Resp:  [20-21] 20 (09/20 0636) BP: (102-117)/(63-76) 102/63 (09/20 0636) SpO2:  [96 %-100 %] 96 % (09/20 0636) Weight:  [147 lb 4.3 oz (66.8 kg)] 147 lb 4.3 oz (66.8 kg) (09/20 0636) Last BM Date: 10/02/15  Intake/Output from previous day: 09/19 0701 - 09/20 0700 In: 2541.5 [P.O.:1404; I.V.:1117.5; NG/GT:20] Out: 300 [Emesis/NG output:300] Intake/Output this shift: No intake/output data recorded.  PE: Gen:  Alert, NAD, pleasant Abd: Soft, NT/ND, +BS  Lab Results:   Recent Labs  10/02/15 0214 10/03/15 0415  WBC 7.8 6.6  HGB 11.9* 9.7*  HCT 35.4* 29.2*  PLT 155 135*   BMET  Recent Labs  10/02/15 0214 10/03/15 0415  NA 142 143  K 4.6 2.9*  CL 100* 106  CO2 31 32  GLUCOSE 82 103*  BUN 27* 22*  CREATININE 1.12* 0.93  CALCIUM 7.9* 7.9*   PT/INR No results for input(s): LABPROT, INR in the last 72 hours. CMP     Component Value Date/Time   NA 143 10/03/2015 0415   NA 145 08/28/2015   K 2.9 (L) 10/03/2015 0415   CL 106 10/03/2015 0415   CO2 32 10/03/2015 0415   GLUCOSE 103 (H) 10/03/2015 0415   BUN 22 (H) 10/03/2015 0415   BUN 18 08/28/2015   CREATININE 0.93 10/03/2015 0415   CALCIUM 7.9 (L) 10/03/2015 0415   PROT 5.4 (L) 10/03/2015 0415   ALBUMIN 2.8 (L) 10/03/2015 0415   AST 19 10/03/2015 0415   ALT 12 (L) 10/03/2015 0415   ALKPHOS 111 10/03/2015 0415   BILITOT 0.7 10/03/2015 0415   GFRNONAA 54 (L) 10/03/2015 0415   GFRAA >60 10/03/2015 0415   Lipase     Component Value Date/Time   LIPASE 34 10/01/2015 1040        Studies/Results: Ct Abdomen Pelvis W Contrast  Result Date: 10/01/2015 CLINICAL DATA:  Nursing home patient with dementia, presenting with fecal emesis. Evaluate for bowel obstruction. EXAM: CT ABDOMEN AND PELVIS WITH CONTRAST TECHNIQUE: Multidetector CT imaging of the abdomen and pelvis was performed using the standard protocol following bolus administration of intravenous contrast. CONTRAST:  33mL ISOVUE-300 IOPAMIDOL (ISOVUE-300) INJECTION 61% COMPARISON:  None. FINDINGS: Lower chest: The heart is enlarged. There is atherosclerosis of the coronary arteries. No significant pleural or pericardial effusion. No confluent basilar airspace disease. Hepatobiliary: No focal hepatic abnormalities are identified. There is no significant biliary dilatation status post cholecystectomy. Pancreas: Diffusely atrophied. No evidence of pancreatic mass, ductal dilatation or surrounding inflammation. Spleen: Normal in size without focal abnormality. Adrenals/Urinary Tract: Both adrenal glands appear normal. Both kidneys demonstrate cortical thinning. There are cysts in the lower pole of the right kidney, measuring up to 3.2 cm in diameter. No evidence of renal mass, urinary tract calculus or hydronephrosis. The bladder appears unremarkable for its degree of distention. Stomach/Bowel: Small hiatal hernia. The stomach, proximal and mid small bowel are mildly dilated with multiple air-fluid levels. The distal small bowel is decompressed. The transition point is suspected to be in the false pelvis (axial image 56).  No surrounding soft tissue mass or fluid collection is apparent in this area. The colon is decompressed. No apparent fistula or focal extraluminal fluid collection. There is a small amount of free pelvic fluid. Vascular/Lymphatic: There are no enlarged abdominal or pelvic lymph nodes. Patient has an aorto bi-iliac stent graft which appears patent. There is a thrombosed aneurysm of the abdominal aorta with a  maximal AP diameter of 6.0 cm. No evidence of endograft leak. Both internal iliac arteries appear thrombosed status post probable embolization on the right. The external iliac arteries are patent. IVC filter noted with mild strut penetration. Reproductive: Hysterectomy.  No evidence of adnexal mass. Other: There is a small amount of ascites. There is generalized edema throughout the mesenteric and subcutaneous fat. No focal extraluminal fluid collections are seen. Musculoskeletal: No acute or significant osseous findings. The bones are demineralized. There is a possible sacral decubitus ulcer. No evidence of bone destruction. Lower lumbar spondylosis noted. IMPRESSION: 1. Findings are consistent with a mid to distal small bowel obstruction. Transition point appears to be in the false pelvis without discernible cause, suggesting adhesions. 2. No fistula or abscess identified. 3. Generalized soft tissue edema with mild pelvic ascites. 4. Patent aortic stent graft. Electronically Signed   By: Carey BullocksWilliam  Veazey M.D.   On: 10/01/2015 13:50   Dg Chest Portable 1 View  Result Date: 10/01/2015 CLINICAL DATA:  Nasogastric tube placement. Decreased oxygen saturation. EXAM: PORTABLE CHEST 1 VIEW COMPARISON:  None. FINDINGS: Nasogastric tube tip and side port are below the diaphragm. No pneumothorax. There is fine nodular interstitial disease throughout the lungs with a mid to lower lung lung zone predominance. There is no airspace consolidation or volume loss. The heart size and pulmonary vascular normal. No adenopathy. There is atherosclerotic calcification in the aorta. There is degenerative change in each shoulder. IMPRESSION: Nasogastric tube tip and side port below the diaphragm. Fine nodular interstitial disease throughout the lungs of uncertain etiology or chronicity. No frank edema or consolidation. Cardiac silhouette within normal limits. There is aortic atherosclerosis. Electronically Signed   By: Bretta BangWilliam  Woodruff  III M.D.   On: 10/01/2015 14:21   Dg Abd Portable 1v-small Bowel Obstruction Protocol-initial, 8 Hr Delay  Result Date: 10/02/2015 CLINICAL DATA:  Small bowel obstruction.  8 hour post contrast film. EXAM: PORTABLE ABDOMEN - 1 VIEW COMPARISON:  10/01/2015 FINDINGS: Enteric tube tip is in the right upper quadrant, likely in the distal stomach or duodenum bulb region. Gaseous distention of the stomach. Contrast material is demonstrated throughout the colon suggesting no evidence of complete small bowel obstruction. Vascular stents and coils. Inferior vena caval filter. Surgical clips in the right abdomen. IMPRESSION: Contrast material is present in the colon suggesting no evidence of complete small bowel obstruction. Electronically Signed   By: Burman NievesWilliam  Stevens M.D.   On: 10/02/2015 04:40   Dg Abd Portable 1v-small Bowel Protocol-position Verification  Result Date: 10/01/2015 CLINICAL DATA:  NG tube placement. EXAM: PORTABLE ABDOMEN - 1 VIEW COMPARISON:  CT 10/01/2015 FINDINGS: NG tube is in place with the tip in the perihilar region, possibly distal stomach or proximal duodenum. Side port is in the distal stomach. Decreasing small bowel dilatation. Prior cholecystectomy. No free air. IMPRESSION: NG tube tip in the peripyloric region, possibly distal stomach or proximal duodenum. Decreasing small bowel dilatation. Electronically Signed   By: Charlett NoseKevin  Dover M.D.   On: 10/01/2015 16:21    Anti-infectives: Anti-infectives    None       Assessment/Plan SBO -  resolving on xray - tolerating clears - +BM and flatus  Plan - advance diet. General surgery will sign off, but please contact us with any concerns.   LOS: 2 days    [name] , [facility] Surgery 10/03/2015, 7:19 AM Pager: [phone] Consults: [phone] Mon-Fri 7:00 am-4:30 pm Sat-Sun 7:00 am-11:30 am

## 2015-10-03 NOTE — Progress Notes (Signed)
During bedside rounding with MD, patient was noted to be very lethargic and difficult to arouse. This was a change in mental status from earlier in the morning. Patient had previously been very alert and ate a very good clear liquid breakfast. After waking patient during bedside rounds, patient c/o left sided weakness more than her baseline, numbness in her left arm, leg, and face, and stated she felt her speech was "different." Patient was A&O x4, knew the year but did not know the month. Code stroke was called and RR team assisted with care. Patient was transferred to SD per MD orders. Daughter present at bedside and aware of changes.

## 2015-10-04 ENCOUNTER — Encounter (HOSPITAL_COMMUNITY): Payer: Self-pay

## 2015-10-04 DIAGNOSIS — D649 Anemia, unspecified: Secondary | ICD-10-CM

## 2015-10-04 DIAGNOSIS — I5033 Acute on chronic diastolic (congestive) heart failure: Secondary | ICD-10-CM

## 2015-10-04 DIAGNOSIS — D696 Thrombocytopenia, unspecified: Secondary | ICD-10-CM

## 2015-10-04 DIAGNOSIS — G459 Transient cerebral ischemic attack, unspecified: Secondary | ICD-10-CM

## 2015-10-04 LAB — BASIC METABOLIC PANEL
ANION GAP: 5 (ref 5–15)
BUN: 18 mg/dL (ref 4–21)
BUN: 18 mg/dL (ref 6–20)
CALCIUM: 8 mg/dL — AB (ref 8.9–10.3)
CO2: 30 mmol/L (ref 22–32)
Chloride: 106 mmol/L (ref 101–111)
Creatinine, Ser: 0.96 mg/dL (ref 0.44–1.00)
Creatinine: 1 mg/dL (ref 0.5–1.1)
GFR calc non Af Amer: 52 mL/min — ABNORMAL LOW (ref >60)
GLUCOSE: 113 mg/dL
Glucose, Bld: 113 mg/dL — ABNORMAL HIGH (ref 65–99)
Potassium: 3.6 mmol/L (ref 3.4–5.3)
Potassium: 3.6 mmol/L (ref 3.5–5.1)
Sodium: 141 mmol/L (ref 135–145)
Sodium: 141 mmol/L (ref 137–147)

## 2015-10-04 LAB — LIPID PANEL
CHOLESTEROL: 74 mg/dL (ref 0–200)
HDL: 31 mg/dL — ABNORMAL LOW (ref >40)
LDL Cholesterol: 28 mg/dL (ref 0–99)
TRIGLYCERIDES: 74 mg/dL (ref <150)
Total CHOL/HDL Ratio: 2.4 RATIO
VLDL: 15 mg/dL (ref 0–40)

## 2015-10-04 LAB — CBC
HCT: 27.3 % — ABNORMAL LOW (ref 36.0–46.0)
HEMOGLOBIN: 9.2 g/dL — AB (ref 12.0–15.0)
MCH: 29.5 pg (ref 26.0–34.0)
MCHC: 33.7 g/dL (ref 30.0–36.0)
MCV: 87.5 fL (ref 78.0–100.0)
Platelets: 122 10*3/uL — ABNORMAL LOW (ref 150–400)
RBC: 3.12 MIL/uL — AB (ref 3.87–5.11)
RDW: 15.5 % (ref 11.5–15.5)
WBC: 5.2 10*3/uL (ref 4.0–10.5)

## 2015-10-04 LAB — CBC AND DIFFERENTIAL
HEMATOCRIT: 27 % — AB (ref 36–46)
HEMOGLOBIN: 9.2 g/dL — AB (ref 12.0–16.0)
PLATELETS: 122 10*3/uL — AB (ref 150–399)
WBC: 5.2 10*3/mL

## 2015-10-04 LAB — HEPATIC FUNCTION PANEL
ALT: 15 U/L (ref 7–35)
AST: 40 U/L — AB (ref 13–35)
Alkaline Phosphatase: 137 U/L — AB (ref 25–125)
BILIRUBIN, TOTAL: 0.9 mg/dL

## 2015-10-04 MED ORDER — METOPROLOL TARTRATE 50 MG PO TABS
100.0000 mg | ORAL_TABLET | Freq: Two times a day (BID) | ORAL | Status: DC
Start: 2015-10-04 — End: 2015-10-08
  Administered 2015-10-04 – 2015-10-08 (×9): 100 mg via ORAL
  Filled 2015-10-04 (×2): qty 2
  Filled 2015-10-04: qty 4
  Filled 2015-10-04 (×5): qty 2

## 2015-10-04 MED ORDER — DULOXETINE HCL 30 MG PO CPEP
60.0000 mg | ORAL_CAPSULE | Freq: Every day | ORAL | Status: DC
  Administered 2015-10-04 – 2015-10-07 (×4): 60 mg via ORAL
  Filled 2015-10-04 (×4): qty 2

## 2015-10-04 MED ORDER — FUROSEMIDE 10 MG/ML IJ SOLN
40.0000 mg | Freq: Two times a day (BID) | INTRAMUSCULAR | Status: DC
  Administered 2015-10-04 – 2015-10-05 (×4): 40 mg via INTRAVENOUS
  Filled 2015-10-04 (×5): qty 4

## 2015-10-04 MED ORDER — PHENYTOIN SODIUM EXTENDED 100 MG PO CAPS
100.0000 mg | ORAL_CAPSULE | Freq: Every day | ORAL | Status: DC
  Administered 2015-10-05 – 2015-10-08 (×4): 100 mg via ORAL
  Filled 2015-10-04 (×4): qty 1

## 2015-10-04 MED ORDER — PHENYTOIN SODIUM EXTENDED 100 MG PO CAPS
200.0000 mg | ORAL_CAPSULE | Freq: Every day | ORAL | Status: DC
  Administered 2015-10-04 – 2015-10-07 (×4): 200 mg via ORAL
  Filled 2015-10-04 (×4): qty 2

## 2015-10-04 MED ORDER — PANTOPRAZOLE SODIUM 40 MG PO TBEC
40.0000 mg | DELAYED_RELEASE_TABLET | Freq: Every day | ORAL | Status: DC
  Administered 2015-10-04 – 2015-10-08 (×5): 40 mg via ORAL
  Filled 2015-10-04 (×5): qty 1

## 2015-10-04 MED ORDER — APIXABAN 2.5 MG PO TABS
2.5000 mg | ORAL_TABLET | Freq: Two times a day (BID) | ORAL | Status: DC
  Administered 2015-10-04 – 2015-10-05 (×3): 2.5 mg via ORAL
  Filled 2015-10-04 (×3): qty 1

## 2015-10-04 MED ORDER — PHENYTOIN SODIUM EXTENDED 100 MG PO CAPS: 100.0000 mg | ORAL_CAPSULE | Freq: Three times a day (TID) | ORAL | Status: DC

## 2015-10-04 MED ORDER — RISPERIDONE 1 MG PO TABS
1.0000 mg | ORAL_TABLET | Freq: Two times a day (BID) | ORAL | Status: DC
  Administered 2015-10-04 – 2015-10-08 (×9): 1 mg via ORAL
  Filled 2015-10-04 (×7): qty 1

## 2015-10-04 MED ORDER — DILTIAZEM HCL ER COATED BEADS 180 MG PO CP24
360.0000 mg | ORAL_CAPSULE | Freq: Every day | ORAL | Status: DC
  Administered 2015-10-04 – 2015-10-08 (×5): 360 mg via ORAL
  Filled 2015-10-04 (×5): qty 2

## 2015-10-04 MED ORDER — DONEPEZIL HCL 10 MG PO TABS
10.0000 mg | ORAL_TABLET | Freq: Every day | ORAL | Status: DC
  Administered 2015-10-04 – 2015-10-07 (×4): 10 mg via ORAL
  Filled 2015-10-04 (×4): qty 1

## 2015-10-04 NOTE — NC FL2 (Signed)
[place] MEDICAID FL2 LEVEL OF CARE SCREENING TOOL     IDENTIFICATION  Patient Name: Brandi Heath Birthdate: 1927/05/04 Sex: female Admission Date (Current Location): 10/01/2015  [facility] [place]   Facility and Address:  [facility],  [address], [place] [id number]      Provider Number: [id number]  Attending Physician Name and Address:  Renae FickleMackenzie Short, MD  Relative Name and Phone Number:       Current Level of Care: Hospital Recommended Level of Care: Skilled Nursing Facility Prior Approval Number:    Date Approved/Denied:   PASRR Number:    Discharge Plan: SNF    Current Diagnoses: Patient Active Problem List   Diagnosis Date Noted  . Acute on chronic diastolic heart failure (HCC) 10/04/2015  . Normocytic anemia 10/04/2015  . Thrombocytopenia (HCC) 10/04/2015  . TIA (transient ischemic attack) 10/04/2015  . Cough 10/02/2015  . SBO (small bowel obstruction) (HCC) 10/01/2015  . Chronic CHF (congestive heart failure) (HCC) 09/29/2015  . Venous insufficiency (chronic) (peripheral) 09/29/2015  . Vitamin B12 deficiency 09/29/2015  . Anemia, chronic disease 09/29/2015  . Enterococcus UTI 09/02/2015  . GERD (gastroesophageal reflux disease) 09/02/2015  . Adynamic ileus (HCC) 06/11/2015  . CHF, acute on chronic (HCC) 06/11/2015  . Personal history of DVT (deep vein thrombosis) 06/11/2015  . Nausea with vomiting 06/03/2015  . Jaw pain 05/27/2015  . E. coli UTI 04/22/2015  . Prerenal acute renal failure (HCC) 04/22/2015  . History of CVA (cerebrovascular accident) 04/22/2015  . AF (atrial fibrillation) (HCC) 04/22/2015  . Polyneuropathy (HCC) 04/22/2015  . Edema 01/25/2015  . Atrial fibrillation with RVR (HCC) 01/20/2015  . Acute encephalopathy 01/20/2015  . UTI (urinary tract infection) 01/20/2015  . Tracheobronchomalacia 01/20/2015  . Personal history of PE (pulmonary embolism) 01/20/2015  . Depression 01/20/2015  . OSA  (obstructive sleep apnea)   . Dementia with behavioral disturbance   . Seizures (HCC)   . Hyperlipidemia   . Hypertensive heart disease with CHF (congestive heart failure) (HCC)   . Clotting disorder (HCC)   . Cerebrovascular accident (CVA) due to thrombosis (HCC)     Orientation RESPIRATION BLADDER Height & Weight     Self, Place  Normal Incontinent Weight: 155 lb 3.3 oz (70.4 kg) Height:  5\' 7"  (170.2 cm)  BEHAVIORAL SYMPTOMS/MOOD NEUROLOGICAL BOWEL NUTRITION STATUS      Incontinent Diet (regular)  AMBULATORY STATUS COMMUNICATION OF NEEDS Skin   Extensive Assist Verbally Normal                       Personal Care Assistance Level of Assistance  Bathing, Feeding Bathing Assistance: Limited assistance Feeding assistance: Limited assistance Dressing Assistance: Limited assistance     Functional Limitations Info  Sight, Hearing, Speech Sight Info: Adequate Hearing Info: Impaired Speech Info: Adequate    SPECIAL CARE FACTORS FREQUENCY  PT (By licensed PT), OT (By licensed OT)     PT Frequency: 5 OT Frequency: 5            Contractures Contractures Info: Not present    Additional Factors Info  Code Status, Allergies, Isolation Precautions, Psychotropic Code Status Info: DNR Allergies Info:  Chocolate, Lactose Intolerance (Gi), Penicillins Psychotropic Info: Cymbalta and Risperdal   Isolation Precautions Info: none     Current Medications (10/04/2015):  This is the current hospital active medication list Current Facility-Administered Medications  Medication Dose Route Frequency Provider Last Rate Last Dose  .  stroke: mapping  our early stages of recovery book   Does not apply Once Renae Fickle, MD      . acetaminophen (TYLENOL) tablet 650 mg  650 mg Oral Q6H PRN Osvaldo Shipper, MD   650 mg at 10/04/15 0759   Or  . acetaminophen (TYLENOL) suppository 650 mg  650 mg Rectal Q6H PRN Osvaldo Shipper, MD      . apixaban Everlene Balls) tablet 2.5 mg  2.5 mg Oral BID  Loleta Rose Lilliston, RPH   2.5 mg at 10/04/15 1023  . diltiazem (CARDIZEM CD) 24 hr capsule 360 mg  360 mg Oral Daily Renae Fickle, MD   360 mg at 10/04/15 1023  . diltiazem (CARDIZEM) 100 mg in dextrose 5% (1 mg/mL) infusion  5-15 mg/hr Intravenous Titrated Renae Fickle, MD 15 mL/hr at 10/04/15 0800 15 mg/hr at 10/04/15 0800  . donepezil (ARICEPT) tablet 10 mg  10 mg Oral QHS Renae Fickle, MD      . DULoxetine (CYMBALTA) DR capsule 60 mg  60 mg Oral QHS Renae Fickle, MD      . famotidine (PEPCID) IVPB 20 mg premix  20 mg Intravenous Q12H Renae Fickle, MD   20 mg at 10/04/15 1023  . furosemide (LASIX) injection 40 mg  40 mg Intravenous BID Renae Fickle, MD   40 mg at 10/04/15 1023  . latanoprost (XALATAN) 0.005 % ophthalmic solution 1 drop  1 drop Both Eyes QHS Phylliss Blakes, RPH   1 drop at 10/03/15 2100  . metoprolol tartrate (LOPRESSOR) tablet 100 mg  100 mg Oral BID Renae Fickle, MD   100 mg at 10/04/15 1023  . ondansetron (ZOFRAN) injection 4 mg  4 mg Intravenous Q6H PRN Osvaldo Shipper, MD      . pantoprazole (PROTONIX) EC tablet 40 mg  40 mg Oral Daily Renae Fickle, MD   40 mg at 10/04/15 1023  . [START ON 10/05/2015] phenytoin (DILANTIN) ER capsule 100 mg  100 mg Oral Daily Renae Fickle, MD      . phenytoin (DILANTIN) ER capsule 200 mg  200 mg Oral QHS Renae Fickle, MD      . risperiDONE (RISPERDAL) tablet 1 mg  1 mg Oral BID Renae Fickle, MD   1 mg at 10/04/15 1221  . sodium chloride flush (NS) 0.9 % injection 3 mL  3 mL Intravenous Q12H Osvaldo Shipper, MD   3 mL at 10/04/15 1033     Discharge Medications: Please see discharge summary for a list of discharge medications.  Relevant Imaging Results:  Relevant Lab Results:   Additional Information SSN:  [id number]  [name], [place]

## 2015-10-04 NOTE — Progress Notes (Addendum)
ANTICOAGULATION CONSULT NOTE - Initial Consult  Pharmacy Consult for Eliquis Indication: atrial fibrillation  Allergies  Allergen Reactions  . Chocolate Diarrhea  . Lactose Intolerance (Gi)   . Penicillins     From [facility]    Patient Measurements: Height: 5\' 7"  (170.2 cm) Weight: 155 lb 3.3 oz (70.4 kg) IBW/kg (Calculated) : 61.6  Vital Signs: Temp: 99.1 F (37.3 C) (09/21 0723) Temp Source: Oral (09/21 0723) BP: 121/69 (09/21 0830) Pulse Rate: 46 (09/21 0830)  Labs:  Recent Labs  10/02/15 0214 10/03/15 0415 10/04/15 0405  HGB 11.9* 9.7* 9.2*  HCT 35.4* 29.2* 27.3*  PLT 155 135* 122*  CREATININE 1.12* 0.93 0.96    Estimated Creatinine Clearance: 40.9 mL/min (by C-G formula based on SCr of 0.96 mg/dL).   Medical History: Past Medical History:  Diagnosis Date  . Atrial fibrillation (HCC)   . Clotting disorder (HCC)   . Coronary artery disease   . Dementia without behavioral disturbance   . DVT (deep venous thrombosis) (HCC)   . Hyperlipidemia   . Hypertension   . OSA (obstructive sleep apnea)   . Polyneuropathy (HCC)   . Seizures (HCC)   . Stroke [facility])     Assessment: 82 yo F on chronic Eliquis 2.5mg  po BID for Afib.  She also has a remote hx of VTE.  Eliquis was held on admission due to heme positive gastric fluid.  There was a concern for acute CVA while off Eliquis, however head CT was negative for an acute event.   Hg continues to trend down, however no evidence of active bleeding.  Pharmacy asked to resume Eliquis today.   Of note, patient is on both phenytoin & Eliquis PTA.  The use of DOACs is not recommended in combination with phenytoin due to Cyp 3A4/PG interactions which can cause sub-therapeutic anticoagulation effects.    Plan:  Eliquis 2.5mg  po BID Monitor CBC, s/sx of bleeding F/U with MD re: DDI  Elson Clan 10/04/2015,9:35 AM

## 2015-10-04 NOTE — Clinical Social Work Note (Addendum)
Clinical Social Work Assessment  Patient Details  Name: Brandi Heath MRN: [id number] Date of Birth: 06-19-1927  Date of referral:  10/04/15               Reason for consult:  Discharge Planning, Other (Comment Required) (Patient admitted from [facility])                Permission sought to share information with:  Case Manager, Magazine features editor, Family Supports Permission granted to share information::  Yes, Verbal Permission Granted  Name::        Agency::  [facility]  Relationship::  Daughter: Babette Relic [phone]  Contact Information:     Housing/Transportation Living arrangements for the past 2 months:  Skilled Nursing Facility Source of Information:  Medical Team, Facility, Adult Children Patient Interpreter Needed:  None Criminal Activity/Legal Involvement Pertinent to Current Situation/Hospitalization:  No - Comment as needed Significant Relationships:  Adult Children, Friend Lives with:  Facility Resident Do you feel safe going back to the place where you live?  Yes Need for family participation in patient care:  Yes (Comment)  Care giving concerns:  Patient is a resident at [facility].  Currently there are no concerns regarding her care or return.  Spoke with Lowella Bandy at facility and she is in agreement for her to return when medically stable.   Social Worker assessment / plan:LCSW made aware from co-worker that patient is a resident from a SNF.  Confirmed with SNF that patient can return at [place].  No concerns at this time with patient returning.  Patient remains in hospital with recent transfer to ICU due to mental status change and rule out Stroke. Daughter involved in care and aware of current condition. LCSW will follow acutely and assist with discharge planning needs. Will update FL2 when appropriate Still awaiting pending PT consult, but not appropriate at this time to assess. Will follow.  Employment status:  Retired Automotive engineer PT Recommendations:  Not assessed at this time Information / Referral to community resources:  Skilled Nursing Facility  Patient/Family's Response to care:  Agreeable to plan, concerned with patient's current medical baseline  Patient/Family's Understanding of and Emotional Response to Diagnosis, Current Treatment, and Prognosis: Daughter understanding of plan and transfer to ICU along with stroke work up due to neurological concerns and numbness.    Emotional Assessment Appearance:  Appears stated age Attitude/Demeanor/Rapport:  Lethargic Affect (typically observed):  Withdrawn, Quiet Orientation:  Oriented to Self, Place Alcohol / Substance use:  Not Applicable Psych involvement (Current and /or in the community):  No (Comment)  Discharge Needs  Concerns to be addressed:  Adjustment to Illness Readmission within the last 30 days:  No Current discharge risk:  None Barriers to Discharge:  Continued Medical Work up   [name], LCSW 10/04/2015, 11:48 AM

## 2015-10-04 NOTE — Evaluation (Signed)
Physical Therapy Evaluation Patient Details Name: Brandi Heath MRN: [id number] DOB: 10-12-1927 Today's Date: 10/04/2015   History of Present Illness  Brandi Heath is a 82 y.o. female adm with SBO;   past medical history of dementia, previous episodes of small bowel obstruction, atrial fibrillation on anticoagulation, history of PE and DVT, history of stroke, history of seizure disorder who was in her usual state of health at her nursing facility, [facility]  Clinical Impression  Pt admitted with above diagnosis. Pt currently with functional limitations due to the deficits listed below (see PT Problem List). * Pt will benefit from skilled PT to increase their independence and safety with mobility to allow discharge to the venue listed below. Pt vital signs stable during session; will continue to follow although unsure of pt's baseline  Functional status as she is unable to give info and she is from SNF     Follow Up Recommendations SNF    Equipment Recommendations  None recommended by PT    Recommendations for Other Services       Precautions / Restrictions Precautions Precautions: Fall Restrictions Weight Bearing Restrictions: No      Mobility  Bed Mobility Overal bed mobility: Needs Assistance Bed Mobility: Supine to Sit     Supine to sit: Min assist;+2 for safety/equipment     General bed mobility comments: assist with LEs, min/guard for trunk, second person for lines and safety  Transfers Overall transfer level: Needs assistance Equipment used: Rolling walker (2 wheeled) Transfers: Sit to/from [employer] Sit to Stand: Mod assist;+2 physical assistance;+2 safety/equipment Stand pivot transfers: Max assist;+2 physical assistance;+2 safety/equipment       General transfer comment: cues for safety, hand placement, assist to rise, balance and pivot ti chair  Ambulation/Gait             General Gait Details: NT-unable--unsure of pt  baseline  Stairs            Wheelchair Mobility    Modified Rankin (Stroke Patients Only)       Balance Overall balance assessment: Needs assistance Sitting-balance support: No upper extremity supported;Feet supported Sitting balance-Leahy Scale: Fair       Standing balance-Leahy Scale: Zero                               Pertinent Vitals/Pain Pain Assessment: No/denies pain    Home Living Family/patient expects to be discharged to:: Skilled nursing facility                      Prior Function           Comments: pt unable to give accurate info regarding prior functional status     Hand Dominance        Extremity/Trunk Assessment   Upper Extremity Assessment: Defer to OT evaluation;RUE deficits/detail RUE Deficits / Details: AROM grossly WFL bil, appears symmetrical, grip fair bil         Lower Extremity Assessment: Generalized weakness         Communication   Communication: No difficulties  Cognition Arousal/Alertness: Awake/alert Behavior During Therapy: WFL for tasks assessed/performed Overall Cognitive Status: History of cognitive impairments - at baseline                      General Comments      Exercises     Assessment/Plan    PT Assessment Patient needs  continued PT services  PT Problem List Decreased activity tolerance;Decreased strength;Decreased balance;Decreased cognition;Decreased mobility          PT Treatment Interventions DME instruction;Gait training;Functional mobility training;Therapeutic exercise;Therapeutic activities;Patient/family education    PT Goals (Current goals can be found in the Care Plan section)  Acute Rehab PT Goals Patient Stated Goal: unable to state PT Goal Formulation: With patient Time For Goal Achievement: 10/11/15 Potential to Achieve Goals: Good    Frequency Min 2X/week   Barriers to discharge        Co-evaluation               End of Session  Equipment Utilized During Treatment: Gait belt Activity Tolerance: Patient tolerated treatment well Patient left: in chair;with call bell/phone within reach;with chair alarm set Nurse Communication: Mobility status         Time: [id number] PT Time Calculation (min) (ACUTE ONLY): 16 min   Charges:   PT Evaluation $PT Eval Low Complexity: 1 Procedure     PT G Codes:        Brandi Heath 10/04/2015, 1:02 PM

## 2015-10-04 NOTE — Progress Notes (Signed)
PROGRESS NOTE  Mylisa Brunson  OZH:[id number] DOB: 12-23-1927 DOA: 10/01/2015 PCP: Merrilee Seashore, MD  Brief Narrative:  Eunie Lawn a 82 y.o.femalewith a past medical history of dementia, previous episodes of small bowel obstruction, atrial fibrillation on anticoagulation, history of PE and DVT, history of stroke, history of seizure disorder who was in her usual state of health at her nursing [name] this past Thursday and Friday when she started having nausea followed by vomiting.  She was admitted with small bowel obstruction secondary to adhesions from previous surgery.  She was also in Afib with RVR and started on IV diltiazem infusion.  She was converted to oral diltiazem.  On 9/20 she developed sudden onset numbness of the left extremities.  MRI was negative for stroke.    Assessment & Plan:   Principal Problem:   SBO (small bowel obstruction) (HCC) Active Problems:   Dementia with behavioral disturbance   Atrial fibrillation with RVR (HCC)   Personal history of PE (pulmonary embolism)   History of CVA (cerebrovascular accident)   Personal history of DVT (deep vein thrombosis)   Chronic CHF (congestive heart failure) (HCC)  Sudden left sided numbness and worsening slurred speech in setting of a-fib.  Probable TIA.  Symptoms resolved -  D/c aspirin -  Resume apixaban -  Appreciate speech assistance -  Transition to oral medications -  MRI/MRA brain:  No evidence of acute stroke -  No need for neurology consultation at this time given negative MRI -  UA and urine culture pending -  LDL 28 -  A1c pending  Wheezing and SOB, likely acute on chronic diastolic heart failure secondary to a-fib with RVR -  CXR yesterday demonstrated vascular congestion -  D/c IVF -  Start lasix 40mg  IV BID -  Daily weights and strict I/O  Small bowel obstruction resolved with conservative measures.  She was followed by general surgery.  Her NG tube was removed on 9/19.   Tolerating regular diet.  Atrial fibrillation with RVR: She has a long-standing history of atrial fibrillation. She is on Apixabanat home.  She initially required diltiazem infusion.   -  Resume oral dilt -  Increase oral metoprolol -  Transition off diltiazem drip -  TSH 2.030 wnl - ECHO December at [facility] with EF of 50-55%. Valvular disease was noted, but none of them appeared to be severe at that time.  - DC IV metoprolol  -  May need to trial of digoxin if tachycardia is persistent  Heme positive gastric fluid: No overt hematemesis has been noted.  Hemoglobin gradually trended down -  Transition to oral PPI -  Repeat CBC in AM  Dementia: Stable. Monitor closely.  Seizure disorder on Dilantin.  -  Resume oral phenytoin  History of RLE DVT and PE: Not an active issue per family. She does have lower extremity edema, which is getting better per family since she was placed on Lasix and the dose was increased recently. Continue to monitor for now.  Probable CKD stage 3, baseline creatinine 0.9 to 1.3.  Creatinine marginally improved -  avoid nephrotoxins, renally dose medications.  Hypokalemia, resolved with IV supplementation of potassium -  D/c IVF -  Check mag:  wnl -  Repeat K in AM.  Normocytic anemia  -  Iron studies, B12, folate -  TSH -  Occult stool -  Repeat hgb in AM  Mild thrombocytopenia, likely due to hemodilution from IVF  DVT Prophylaxis:apixaban Code Status:DO  NOT RESUSCITATE Family Communication:  Patient alone Disposition:  Transfer to telemetry  Consultants:   General surgery  Procedures:  none  Antimicrobials:   none    Subjective: Passing flatus and still having bowel movements.  Slurred speech and left-sided numbness have resolved.  Feels a little Daniele Dillow of breath and wheezing.   Objective: Vitals:   10/04/15 1030 10/04/15 1100 10/04/15 1130 10/04/15 1200  BP: 106/77 105/70 107/71 (!) 101/53  Pulse: (!) 112 (!) 117 96 84   Resp: (!) 21 20 (!) 21 (!) 28  Temp:    98.9 F (37.2 C)  TempSrc:    Oral  SpO2: 96% 90% 95% 95%  Weight:      Height:        Intake/Output Summary (Last 24 hours) at 10/04/15 1303 Last data filed at 10/04/15 0500  Gross per 24 hour  Intake          1550.24 ml  Output                0 ml  Net          1550.24 ml   Filed Weights   10/02/15 0353 10/03/15 0636 10/04/15 0500  Weight: 68.1 kg (150 lb 2.1 oz) 66.8 kg (147 lb 4.3 oz) 70.4 kg (155 lb 3.3 oz)    Examination:  General exam:  Adult female, Awake, alert  No acute distress.  HEENT:  NCAT, MMM Respiratory system:  Wheezing throughout, rales at the bilateral bases, no rhonchi  Cardiovascular system:  Tachycardic, IRRR, normal S1/S2. 2/6 systolic murmur.  No gallops or clicks.  Warm extremities Gastrointestinal system: Normal active bowel sounds, soft, nondistended, nontender. MSK:  Normal tone and bulk, no lower extremity edema Neuro:  4/5 strength left upper and lower extremities.  Mild dysmetria left extremities.  No obvious facial droop.  Sensation intact to light touch throughout.  Drift on the left extremities.  No obvious field cuts.  No hemineglect.      Data Reviewed: I have personally reviewed following labs and imaging studies  CBC:  Recent Labs Lab 10/01/15 1040 10/01/15 2019 10/02/15 0214 10/03/15 0415 10/04/15 0405  WBC 7.7 7.9 7.8 6.6 5.2  NEUTROABS 5.7  --   --  4.6  --   HGB 12.6 11.9* 11.9* 9.7* 9.2*  HCT 36.0 35.4* 35.4* 29.2* 27.3*  MCV 84.7 87.2 87.4 88.2 87.5  PLT 175 163 155 135* 122*   Basic Metabolic Panel:  Recent Labs Lab 10/01/15 1040 10/02/15 0214 10/03/15 0415 10/04/15 0405  NA 145 142 143 141  K 3.4* 4.6 2.9* 3.6  CL 99* 100* 106 106  CO2 33* 31 32 30  GLUCOSE 118* 82 103* 113*  BUN 29* 27* 22* 18  CREATININE 1.18* 1.12* 0.93 0.96  CALCIUM 8.7* 7.9* 7.9* 8.0*  MG  --   --  2.1  --    GFR: Estimated Creatinine Clearance: 40.9 mL/min (by C-G formula based on SCr of  0.96 mg/dL). Liver Function Tests:  Recent Labs Lab 10/01/15 1040 10/02/15 0214 10/03/15 0415  AST 28 40 19  ALT 19 15 12*  ALKPHOS 171* 137* 111  BILITOT 0.9 0.9 0.7  PROT 7.2 6.3* 5.4*  ALBUMIN 3.7 3.1* 2.8*    Recent Labs Lab 10/01/15 1040  LIPASE 34   No results for input(s): AMMONIA in the last 168 hours. Coagulation Profile: No results for input(s): INR, PROTIME in the last 168 hours. Cardiac Enzymes: No results for input(s): CKTOTAL, CKMB, CKMBINDEX,  TROPONINI in the last 168 hours. BNP (last 3 results) No results for input(s): PROBNP in the last 8760 hours. HbA1C: No results for input(s): HGBA1C in the last 72 hours. CBG: No results for input(s): GLUCAP in the last 168 hours. Lipid Profile:  Recent Labs  10/04/15 0405  CHOL 74  HDL 31*  LDLCALC 28  TRIG 74  CHOLHDL 2.4   Thyroid Function Tests:  Recent Labs  10/02/15 0214  TSH 2.030   Anemia Panel: No results for input(s): VITAMINB12, FOLATE, FERRITIN, TIBC, IRON, RETICCTPCT in the last 72 hours. Urine analysis:    Component Value Date/Time   COLORURINE YELLOW 10/01/2015 1022   APPEARANCEUR CLEAR 10/01/2015 1022   LABSPEC 1.041 (H) 10/01/2015 1022   PHURINE 7.5 10/01/2015 1022   GLUCOSEU NEGATIVE 10/01/2015 1022   HGBUR NEGATIVE 10/01/2015 1022   BILIRUBINUR SMALL (A) 10/01/2015 1022   KETONESUR NEGATIVE 10/01/2015 1022   PROTEINUR NEGATIVE 10/01/2015 1022   NITRITE NEGATIVE 10/01/2015 1022   LEUKOCYTESUR NEGATIVE 10/01/2015 1022   Sepsis Labs: @LABRCNTIP (procalcitonin:4,lacticidven:4)  ) Recent Results (from the past 240 hour(s))  MRSA PCR Screening     Status: None   Collection Time: 10/01/15  5:14 PM  Result Value Ref Range Status   MRSA by PCR NEGATIVE NEGATIVE Final    Comment:        The GeneXpert MRSA Assay (FDA approved for NASAL specimens only), is one component of a comprehensive MRSA colonization surveillance program. It is not intended to diagnose MRSA infection  nor to guide or monitor treatment for MRSA infections.       Radiology Studies: Mr Shirlee Latch ZO Contrast  Result Date: 10/03/2015 CLINICAL DATA:  Code stroke. Acute mental status changes beginning 90 minutes ago. Lethargy. Left-sided numbness. EXAM: MRI HEAD WITHOUT CONTRAST MRA HEAD WITHOUT CONTRAST TECHNIQUE: Multiplanar, multiecho pulse sequences of the brain and surrounding structures were obtained without intravenous contrast. Angiographic images of the head were obtained using MRA technique without contrast. COMPARISON:  None. FINDINGS: MRI HEAD FINDINGS Brain: Diffusion imaging does not show any acute or subacute infarction. The brainstem is normal. There is cerebellar atrophy. Cerebral hemispheres show generalized age related atrophy with chronic small-vessel ischemic change of the deep white matter. No large vessel territory infarction. No mass lesion, hemorrhage, hydrocephalus or extra-axial collection. No pituitary mass. Vascular: Major vessels are patent at the base of the brain. Skull and upper cervical spine: Negative Sinuses/Orbits: Small mastoid effusions. Other: None significant MRA HEAD FINDINGS Both internal carotid arteries are widely patent through the skullbase. On the left, the anterior and middle cerebral vessels are patent without proximal stenosis, aneurysm or vascular malformation. More distal branch vessels show atherosclerotic irregularity. On the right, the anterior cerebral branches are patent. There is what I believe is artifactual signal loss within the right middle cerebral artery territory related to downward tortuosity. In evaluating the source images, that vessel appears to be widely patent. Both vertebral arteries are widely patent to the basilar. No basilar stenosis. Posterior circulation branch vessels are normal. IMPRESSION: No acute infarction. Generalized age related atrophy and chronic small vessel change of the cerebral hemispheric deep white matter. Intracranial  MR angiography shows patency of the large and medium size vessels. There is artifactual signal loss within the right middle cerebral artery related to downward tortuosity. Evaluation of the source images shows that vessel to be patent. The patient does have some distal vessel atherosclerotic irregularity. Electronically Signed   By: Paulina Fusi M.D.   On: 10/03/2015  12:15   Mr Brain Wo Contrast  Result Date: 10/03/2015 CLINICAL DATA:  Code stroke. Acute mental status changes beginning 90 minutes ago. Lethargy. Left-sided numbness. EXAM: MRI HEAD WITHOUT CONTRAST MRA HEAD WITHOUT CONTRAST TECHNIQUE: Multiplanar, multiecho pulse sequences of the brain and surrounding structures were obtained without intravenous contrast. Angiographic images of the head were obtained using MRA technique without contrast. COMPARISON:  None. FINDINGS: MRI HEAD FINDINGS Brain: Diffusion imaging does not show any acute or subacute infarction. The brainstem is normal. There is cerebellar atrophy. Cerebral hemispheres show generalized age related atrophy with chronic small-vessel ischemic change of the deep white matter. No large vessel territory infarction. No mass lesion, hemorrhage, hydrocephalus or extra-axial collection. No pituitary mass. Vascular: Major vessels are patent at the base of the brain. Skull and upper cervical spine: Negative Sinuses/Orbits: Small mastoid effusions. Other: None significant MRA HEAD FINDINGS Both internal carotid arteries are widely patent through the skullbase. On the left, the anterior and middle cerebral vessels are patent without proximal stenosis, aneurysm or vascular malformation. More distal branch vessels show atherosclerotic irregularity. On the right, the anterior cerebral branches are patent. There is what I believe is artifactual signal loss within the right middle cerebral artery territory related to downward tortuosity. In evaluating the source images, that vessel appears to be widely  patent. Both vertebral arteries are widely patent to the basilar. No basilar stenosis. Posterior circulation branch vessels are normal. IMPRESSION: No acute infarction. Generalized age related atrophy and chronic small vessel change of the cerebral hemispheric deep white matter. Intracranial MR angiography shows patency of the large and medium size vessels. There is artifactual signal loss within the right middle cerebral artery related to downward tortuosity. Evaluation of the source images shows that vessel to be patent. The patient does have some distal vessel atherosclerotic irregularity. Electronically Signed   By: Paulina FusiMark  Shogry M.D.   On: 10/03/2015 12:15   Dg Chest Port 1 View  Result Date: 10/03/2015 CLINICAL DATA:  Abnormal breath sounds. EXAM: PORTABLE CHEST 1 VIEW COMPARISON:  10/01/2015. FINDINGS: Cardiomegaly with pulmonary vascular prominence. Mild right perihilar interstitial prominence noted. Mild interstitial edema and/or pneumonitis cannot be excluded. Left pleural effusion. No pneumothorax IMPRESSION: 1.  Interim removal of NG tube. 2. Cardiomegaly with mild pulmonary venous congestion. Mild right perihilar interstitial prominence. Mild perihilar pulmonary edema and/or pneumonitis cannot be excluded. Electronically Signed   By: Maisie Fushomas  Register   On: 10/03/2015 11:21     Scheduled Meds: .  stroke: mapping our early stages of recovery book   Does not apply Once  . apixaban  2.5 mg Oral BID  . diltiazem  360 mg Oral Daily  . donepezil  10 mg Oral QHS  . DULoxetine  60 mg Oral QHS  . famotidine (PEPCID) IV  20 mg Intravenous Q12H  . furosemide  40 mg Intravenous BID  . latanoprost  1 drop Both Eyes QHS  . metoprolol tartrate  100 mg Oral BID  . pantoprazole  40 mg Oral Daily  . [START ON 10/05/2015] phenytoin  100 mg Oral Daily  . phenytoin  200 mg Oral QHS  . risperiDONE  1 mg Oral BID  . sodium chloride flush  3 mL Intravenous Q12H   Continuous Infusions: . diltiazem  (CARDIZEM) infusion 15 mg/hr (10/04/15 0800)     LOS: 3 days    Time spent: 30 min    Renae FickleSHORT, Darnell Jeschke, MD [facility] Pager [phone]  If 7PM-7AM, please contact night-coverage [email or web address] Password [email or web address] 10/04/2015,  1:03 PM

## 2015-10-04 NOTE — Progress Notes (Signed)
Received patient from ICU, patient stable , alert to self & placed on Telemetry.

## 2015-10-05 ENCOUNTER — Encounter (HOSPITAL_COMMUNITY): Payer: Self-pay

## 2015-10-05 DIAGNOSIS — I5032 Chronic diastolic (congestive) heart failure: Secondary | ICD-10-CM

## 2015-10-05 DIAGNOSIS — R200 Anesthesia of skin: Secondary | ICD-10-CM

## 2015-10-05 DIAGNOSIS — K5669 Other intestinal obstruction: Secondary | ICD-10-CM

## 2015-10-05 DIAGNOSIS — I4891 Unspecified atrial fibrillation: Secondary | ICD-10-CM

## 2015-10-05 LAB — IRON AND TIBC
IRON: 42 ug/dL (ref 28–170)
Saturation Ratios: 20 % (ref 10.4–31.8)
TIBC: 210 ug/dL — ABNORMAL LOW (ref 250–450)
UIBC: 168 ug/dL

## 2015-10-05 LAB — CBC
HEMATOCRIT: 28.6 % — AB (ref 36.0–46.0)
HEMOGLOBIN: 9.8 g/dL — AB (ref 12.0–15.0)
MCH: 29.3 pg (ref 26.0–34.0)
MCHC: 34.3 g/dL (ref 30.0–36.0)
MCV: 85.6 fL (ref 78.0–100.0)
Platelets: 140 10*3/uL — ABNORMAL LOW (ref 150–400)
RBC: 3.34 MIL/uL — AB (ref 3.87–5.11)
RDW: 15.4 % (ref 11.5–15.5)
WBC: 4.9 10*3/uL (ref 4.0–10.5)

## 2015-10-05 LAB — BASIC METABOLIC PANEL
ANION GAP: 6 (ref 5–15)
BUN: 17 mg/dL (ref 6–20)
BUN: 17 mg/dL (ref 4–21)
CALCIUM: 8.4 mg/dL — AB (ref 8.9–10.3)
CHLORIDE: 105 mmol/L (ref 101–111)
CO2: 29 mmol/L (ref 22–32)
Creatinine, Ser: 1.04 mg/dL — ABNORMAL HIGH (ref 0.44–1.00)
Creatinine: 1 mg/dL (ref 0.5–1.1)
GFR calc non Af Amer: 47 mL/min — ABNORMAL LOW (ref >60)
GFR, EST AFRICAN AMERICAN: 55 mL/min — AB (ref >60)
Glucose, Bld: 111 mg/dL — ABNORMAL HIGH (ref 65–99)
Glucose: 111 mg/dL
POTASSIUM: 3.7 mmol/L (ref 3.5–5.1)
Potassium: 3.7 mmol/L (ref 3.4–5.3)
SODIUM: 140 mmol/L (ref 137–147)
Sodium: 140 mmol/L (ref 135–145)

## 2015-10-05 LAB — CBC AND DIFFERENTIAL
HEMATOCRIT: 29 % — AB (ref 36–46)
HEMOGLOBIN: 9.8 g/dL — AB (ref 12.0–16.0)
PLATELETS: 140 10*3/uL — AB (ref 150–399)
WBC: 4.9 10^3/mL

## 2015-10-05 LAB — VITAMIN B12: VITAMIN B 12: 2099 pg/mL — AB (ref 180–914)

## 2015-10-05 LAB — HEMOGLOBIN A1C
HEMOGLOBIN A1C: 5.8 % — AB (ref 4.8–5.6)
MEAN PLASMA GLUCOSE: 120 mg/dL

## 2015-10-05 LAB — TSH: TSH: 1.522 u[IU]/mL (ref 0.350–4.500)

## 2015-10-05 LAB — FERRITIN: FERRITIN: 64 ng/mL (ref 11–307)

## 2015-10-05 LAB — FOLATE: Folate: 22.8 ng/mL (ref >5.9)

## 2015-10-05 MED ORDER — GUAIFENESIN-DM 100-10 MG/5ML PO SYRP
5.0000 mL | ORAL_SOLUTION | ORAL | Status: DC | PRN
  Administered 2015-10-05: 5 mL via ORAL
  Filled 2015-10-05: qty 10

## 2015-10-05 MED ORDER — ENOXAPARIN SODIUM 80 MG/0.8ML ~~LOC~~ SOLN
1.0000 mg/kg | Freq: Two times a day (BID) | SUBCUTANEOUS | Status: DC
  Administered 2015-10-05 – 2015-10-08 (×6): 70 mg via SUBCUTANEOUS
  Filled 2015-10-05 (×8): qty 0.8

## 2015-10-05 NOTE — Progress Notes (Signed)
PROGRESS NOTE  Brandi Heath  QIH:[id number] DOB: 11-Sep-1927 DOA: 10/01/2015 PCP: Merrilee SeashoreAnne Alexander, MD  Brief Narrative:  Brandi NovemberRuth Wilsonis a 82 y.o.femalewith a past medical history of dementia, previous episodes of small bowel obstruction, atrial fibrillation on anticoagulation, history of PE and DVT, history of stroke, history of seizure disorder who was in her usual state of health at her nursing facility, [facility],till this past Thursday and Friday when she started having nausea followed by vomiting.  She was admitted with small bowel obstruction secondary to adhesions from previous surgery.  She was also in Afib with RVR and started on IV diltiazem infusion.  She was converted to oral diltiazem.  On 9/20 she developed sudden onset numbness of the left extremities.  MRI was negative for stroke.    Assessment & Plan:   Principal Problem:   SBO (small bowel obstruction) (HCC) Active Problems:   Dementia with behavioral disturbance   Atrial fibrillation with RVR (HCC)   Personal history of PE (pulmonary embolism)   History of CVA (cerebrovascular accident)   Personal history of DVT (deep vein thrombosis)   Chronic CHF (congestive heart failure) (HCC)   Acute on chronic diastolic heart failure (HCC)   Normocytic anemia   Thrombocytopenia (HCC)   TIA (transient ischemic attack)  TIA with transient left sided numbness and worsening slurred speech in setting of a-fib.   -  Appreciate speech assistance -  Transition to oral medications -  MRI/MRA brain:  No evidence of acute stroke -  UA neg -  LDL 28 -  A1c 5.8 -  Carotid duplex and ECHO to complete work up  Wheezing and SOB, likely acute on chronic diastolic heart failure secondary to a-fib with RVR, improving -  Continue lasix 40mg  IV BID - weight trending up but likely inaccurate -  Unable to record strict I/O due to incontinence  Small bowel obstruction resolved with conservative measures.  She was followed by general surgery.   Her NG tube was removed on 9/19.  Tolerating regular diet.  Atrial fibrillation with RVR:  CHA2DS2 VASc score is 8.  A-flutter with HR in 130s this morning -  Cardiology consult to assist with rate management -  Oral dilt -  Metoprolol 100mg  po BID -  TSH 2.030 wnl -  Drug-drug interaction between apixaban and phenytoin causes a reduction in the efficacy of NOAC.  Will discuss changing phenytoin to keppra with PCP vs. Changing apixaban to warfarin.  -  Start lovenox pending decision about the above  Heme positive gastric fluid: No overt hematemesis has been noted.  Hemoglobin stable near 9.8 mg/dl -  Continue PPI  Dementia: Stable. Monitor closely.  Seizure disorder on Dilantin.  -  Continue oral phenytoin for now  History of RLE DVT and PE: Not an active issue per family. She does have lower extremity edema, which is getting better per family since she was placed on Lasix and the dose was increased recently. Continue to monitor for now.  Probable CKD stage 3, baseline creatinine 0.9 to 1.3.  Creatinine marginally improved -  avoid nephrotoxins, renally dose medications.  Hypokalemia, resolved with IV supplementation of potassium -  Check mag:  wnl -  Repeat K in AM.  Normocytic anemia, folate, B12, TSH, and iron studies wnl.  Likely due to chronic disease and mild CKD  Mild thrombocytopenia, likely due to hemodilution from IVF and resolving   DVT Prophylaxis: lovenox Code Status:DO NOT RESUSCITATE Family Communication:  Patient alone Disposition:  Continue telemetry  Consultants:   General surgery  Cardiology  Procedures:  none  Antimicrobials:   none    Subjective: Passing flatus and still having bowel movements.  Slurred speech and left-sided numbness have resolved.  Breathing has improved.  Objective: Vitals:   10/04/15 1716 10/04/15 2110 10/05/15 0541 10/05/15 1420  BP: 107/71 (!) 109/59 (!) 114/91 110/65  Pulse: 85  96 77  Resp: 19 18 18 18     Temp: 98.8 F (37.1 C) 97.9 F (36.6 C) 98 F (36.7 C) 97.6 F (36.4 C)  TempSrc: Oral Oral Oral Oral  SpO2: 99% 97% 98% 98%  Weight:      Height:        Intake/Output Summary (Last 24 hours) at 10/05/15 1551 Last data filed at 10/05/15 0300  Gross per 24 hour  Intake              250 ml  Output                0 ml  Net              250 ml   Filed Weights   10/02/15 0353 10/03/15 0636 10/04/15 0500  Weight: 68.1 kg (150 lb 2.1 oz) 66.8 kg (147 lb 4.3 oz) 70.4 kg (155 lb 3.3 oz)    Examination:  General exam:  Adult female, Awake, alert  No acute distress.  HEENT:  NCAT, MMM Respiratory system:  Rales at the bilateral bases, no rhonchi or wheeze Cardiovascular system:  Tachycardic, IRRR, normal S1/S2. 2/6 systolic murmur.  No gallops or clicks.  Warm extremities Gastrointestinal system: Normal active bowel sounds, soft, nondistended, nontender. MSK:  Normal tone and bulk, no lower extremity edema Neuro:  4/5 strength left upper and lower extremities.  Mild dysmetria left extremities.  No obvious facial droop.  Sensation intact to light touch throughout.  Drift on the left extremities.  No obvious field cuts.  No hemineglect.      Data Reviewed: I have personally reviewed following labs and imaging studies  CBC:  Recent Labs Lab 10/01/15 1040 10/01/15 2019 10/02/15 0214 10/03/15 0415 10/04/15 0405 10/05/15 0522  WBC 7.7 7.9 7.8 6.6 5.2 4.9  NEUTROABS 5.7  --   --  4.6  --   --   HGB 12.6 11.9* 11.9* 9.7* 9.2* 9.8*  HCT 36.0 35.4* 35.4* 29.2* 27.3* 28.6*  MCV 84.7 87.2 87.4 88.2 87.5 85.6  PLT 175 163 155 135* 122* 140*   Basic Metabolic Panel:  Recent Labs Lab 10/01/15 1040 10/02/15 0214 10/03/15 0415 10/04/15 0405 10/05/15 0522  NA 145 142 143 141 140  K 3.4* 4.6 2.9* 3.6 3.7  CL 99* 100* 106 106 105  CO2 33* 31 32 30 29  GLUCOSE 118* 82 103* 113* 111*  BUN 29* 27* 22* 18 17  CREATININE 1.18* 1.12* 0.93 0.96 1.04*  CALCIUM 8.7* 7.9* 7.9* 8.0*  8.4*  MG  --   --  2.1  --   --    GFR: Estimated Creatinine Clearance: 37.8 mL/min (by C-G formula based on SCr of 1.04 mg/dL (H)). Liver Function Tests:  Recent Labs Lab 10/01/15 1040 10/02/15 0214 10/03/15 0415  AST 28 40 19  ALT 19 15 12*  ALKPHOS 171* 137* 111  BILITOT 0.9 0.9 0.7  PROT 7.2 6.3* 5.4*  ALBUMIN 3.7 3.1* 2.8*    Recent Labs Lab 10/01/15 1040  LIPASE 34   No results for input(s): AMMONIA in the last 168  hours. Coagulation Profile: No results for input(s): INR, PROTIME in the last 168 hours. Cardiac Enzymes: No results for input(s): CKTOTAL, CKMB, CKMBINDEX, TROPONINI in the last 168 hours. BNP (last 3 results) No results for input(s): PROBNP in the last 8760 hours. HbA1C:  Recent Labs  10/04/15 0405  HGBA1C 5.8*   CBG: No results for input(s): GLUCAP in the last 168 hours. Lipid Profile:  Recent Labs  10/04/15 0405  CHOL 74  HDL 31*  LDLCALC 28  TRIG 74  CHOLHDL 2.4   Thyroid Function Tests:  Recent Labs  10/05/15 0522  TSH 1.522   Anemia Panel:  Recent Labs  10/05/15 0522  VITAMINB12 2,099*  FOLATE 22.8  FERRITIN 64  TIBC 210*  IRON 42   Urine analysis:    Component Value Date/Time   COLORURINE YELLOW 10/01/2015 1022   APPEARANCEUR CLEAR 10/01/2015 1022   LABSPEC 1.041 (H) 10/01/2015 1022   PHURINE 7.5 10/01/2015 1022   GLUCOSEU NEGATIVE 10/01/2015 1022   HGBUR NEGATIVE 10/01/2015 1022   BILIRUBINUR SMALL (A) 10/01/2015 1022   KETONESUR NEGATIVE 10/01/2015 1022   PROTEINUR NEGATIVE 10/01/2015 1022   NITRITE NEGATIVE 10/01/2015 1022   LEUKOCYTESUR NEGATIVE 10/01/2015 1022   Sepsis Labs: @LABRCNTIP (procalcitonin:4,lacticidven:4)  ) Recent Results (from the past 240 hour(s))  MRSA PCR Screening     Status: None   Collection Time: 10/01/15  5:14 PM  Result Value Ref Range Status   MRSA by PCR NEGATIVE NEGATIVE Final    Comment:        The GeneXpert MRSA Assay (FDA approved for NASAL specimens only), is one  component of a comprehensive MRSA colonization surveillance program. It is not intended to diagnose MRSA infection nor to guide or monitor treatment for MRSA infections.       Radiology Studies: No results found.   Scheduled Meds: .  stroke: mapping our early stages of recovery book   Does not apply Once  . diltiazem  360 mg Oral Daily  . donepezil  10 mg Oral QHS  . DULoxetine  60 mg Oral QHS  . enoxaparin (LOVENOX) injection  1 mg/kg Subcutaneous Q12H  . famotidine (PEPCID) IV  20 mg Intravenous Q12H  . furosemide  40 mg Intravenous BID  . latanoprost  1 drop Both Eyes QHS  . metoprolol tartrate  100 mg Oral BID  . pantoprazole  40 mg Oral Daily  . phenytoin  100 mg Oral Daily  . phenytoin  200 mg Oral QHS  . risperiDONE  1 mg Oral BID  . sodium chloride flush  3 mL Intravenous Q12H   Continuous Infusions: . diltiazem (CARDIZEM) infusion Stopped (10/04/15 1558)     LOS: 4 days    Time spent: 30 min    Renae Fickle, MD [facility] Pager [phone]  If 7PM-7AM, please contact night-coverage [email or web address] Password [facility] 10/05/2015, 3:51 PM

## 2015-10-05 NOTE — Consult Note (Signed)
Patient ID: Brandi Heath MRN: [id number], DOB/AGE: 07-29-1927   Admit date: 10/01/2015   Reason for Consult: Atrial Fibrillation Requesting MD: Dr. Malachi Bonds, Internal Medicine   Primary Physician: Merrilee Seashore, MD Primary Cardiologist: New (Dr. [name], [facility])   Pt. Profile:  82 y/o female nursing home resident, with h/o dementia, previous h/o SBO, chronic atrial fibrillation on chronic anticoagulation, h/o PE/DVT, stroke and seizure disorder, admitted for severe n/v, found to have recurrent SBO by CT scan. Cardiology consulted for atrial fibrillation w/ RVR.  Also felt to have a/c diastolic CHF and possible TIA (Sudden left sided numbness and worsening slurred speech - symptoms resolved).    Problem List  Past Medical History:  Diagnosis Date  . Atrial fibrillation (HCC)   . CHF (congestive heart failure) (HCC)   . Clotting disorder (HCC)   . Coronary artery disease   . Dementia without behavioral disturbance   . DVT (deep venous thrombosis) (HCC)   . Hyperlipidemia   . Hypertension   . OSA (obstructive sleep apnea)   . PE (pulmonary embolism)   . Polyneuropathy (HCC)   . SBO (small bowel obstruction) (HCC)   . Seizures (HCC)   . Stroke [name])     Past Surgical History:  Procedure Laterality Date  . ABDOMINAL AORTIC ANEURYSM REPAIR    . ABDOMINAL HYSTERECTOMY    . CHOLECYSTECTOMY    . SPINAL FUSION  2003     Allergies  Allergies  Allergen Reactions  . Chocolate Diarrhea  . Lactose Intolerance (Gi)   . Penicillins     From [facility]    HPI  82 y/o female nursing home resident, with h/o dementia, previous h/o SBO, chronic atrial fibrillation on chronic anticoagulation w/ Eliquis, h/o PE/DVT, stroke and seizure disorder, admitted on 10/01/15 for severe n/v, found to have recurrent SBO by CT scan. Cardiology consulted for atrial fibrillation w/ RVR. Also felt to have a/c diastolic CHF and possible TIA (Sudden left sided numbness and worsening slurred speech -  symptoms resolved).    She was admitted by IM. SBO resolved with conservative measures. NGT was removed on 9/19. As outlined above, she was felt to have a TIA with unilateral left extremity numbness and slurred speech. Symptoms resolved. MRI/MRA negative for acute stroke. She is back on PO meds and on Eliquis for a/c. Also with recent SOB and wheezing felt to be secondary to a/c diastolic CHF in the setting of atrial fibrillation w/ RVR. She was given IV lasix. UOP not recorded.   She has had difficult to control atrial fibrillation. Per chart, she has a h/o chronic atrial fibrillation outlined in her PMH. Per family, she has been followed by Dr. [name]. No records currently available in Epic. Current rate control agents include Cardizem CD 360 mg daily and metoprolol tartrate 100 mg BID. Metoprolol was increased from 75 mg to 100 mg. She apparently had a 2D echo at [facility] 01/13/15 which showed normal LVEF of 50-55%, mild-moderate MR, mild AI. Her LA was moderately dilated.   Patient reports that she feels well. She is confused at times, but family is currently present by bedside. She is alert and stable. No supplemental O2 requirements. She denies dyspnea. No CP. Some palpitations but fairly asymptomatic with her afib. Bowels are moving.    Home Medications  Prior to Admission medications   Medication Sig Start Date End Date Taking? Authorizing Provider  acetaminophen (TYLENOL) 325 MG tablet Take 650 mg by mouth every 4 (four) hours as  needed (Pain). pain    Yes Historical Provider, MD  acetaminophen (TYLENOL) 650 MG suppository Place 650 mg rectally every 6 (six) hours. For 2 days. Started 9/17   Yes Historical Provider, MD  apixaban (ELIQUIS) 2.5 MG TABS tablet Take 2.5 mg by mouth 2 (two) times daily.   Yes Historical Provider, MD  Calcium Carbonate-Vitamin D (CALCIUM-VITAMIN D) 500-200 MG-UNIT tablet Take 1 tablet by mouth daily.    Yes Historical Provider, MD  Dextromethorphan-Guaifenesin  (GUAIFENESIN DM PO) Take 10 mLs by mouth every 4 (four) hours as needed (Cough).    Yes Historical Provider, MD  diltiazem (TIAZAC) 360 MG 24 hr capsule Take 360 mg by mouth daily.   Yes Historical Provider, MD  docusate sodium (COLACE) 100 MG capsule Take 100 mg by mouth 2 (two) times daily as needed for mild constipation.    Yes Historical Provider, MD  donepezil (ARICEPT) 10 MG tablet Take 10 mg by mouth at bedtime.    Yes Historical Provider, MD  DULoxetine (CYMBALTA) 60 MG capsule Take 60 mg by mouth at bedtime.    Yes Historical Provider, MD  fexofenadine (ALLEGRA) 180 MG tablet Take 180 mg by mouth daily.    Yes Historical Provider, MD  furosemide (LASIX) 40 MG tablet Take 40 mg by mouth daily.   Yes Historical Provider, MD  Iron-FA-B Cmp-C-Biot-Probiotic (FUSION PLUS) CAPS Take 1 capsule by mouth daily.   Yes Historical Provider, MD  loperamide (IMODIUM) 2 MG capsule Take 4 mg by mouth 4 (four) times daily as needed for diarrhea or loose stools.   Yes Historical Provider, MD  Magnesium Oxide -Mg Supplement 400 MG CAPS Take 1 tablet by mouth daily.   Yes Historical Provider, MD  metoCLOPramide (REGLAN) 10 MG tablet Take 10 mg by mouth 2 (two) times daily.   Yes Historical Provider, MD  Metoprolol Tartrate 75 MG TABS Take 75 mg by mouth daily. Hold for SBP < 90   Yes Historical Provider, MD  mirabegron ER (MYRBETRIQ) 25 MG TB24 tablet Take 25 mg by mouth daily. For over active bladder   Yes Historical Provider, MD  Multiple Vitamins-Minerals (MULTIVITAMIN WITH MINERALS) tablet Take 1 tablet by mouth daily.   Yes Historical Provider, MD  Nutritional Supplements (ENSURE ENLIVE PO) Take 1 Can by mouth daily before breakfast.   Yes Historical Provider, MD  omeprazole (PRILOSEC) 40 MG capsule Take 40 mg by mouth 2 (two) times daily.   Yes Historical Provider, MD  phenytoin (DILANTIN) 100 MG ER capsule Take 200 mg by mouth 2 (two) times daily.   Yes Historical Provider, MD  polyethylene glycol  (MIRALAX / GLYCOLAX) packet Take 17 g by mouth daily. Hold for more than 3 bms in a day   Yes Historical Provider, MD  promethazine (PHENERGAN) 25 MG suppository Place 25 mg rectally every 12 (twelve) hours as needed for nausea or vomiting.   Yes Historical Provider, MD  risperiDONE (RISPERDAL) 1 MG tablet Take 1 mg by mouth 2 (two) times daily.   Yes Historical Provider, MD  simvastatin (ZOCOR) 20 MG tablet Take 20 mg by mouth daily at 6 PM.    Yes Historical Provider, MD  travoprost, benzalkonium, (TRAVATAN) 0.004 % ophthalmic solution Place 1 drop into both eyes at bedtime.   Yes Historical Provider, MD  vitamin B-12 (CYANOCOBALAMIN) 1000 MCG tablet Take 1,000 mcg by mouth daily.   Yes Historical Provider, MD   Hospital Meds  .  stroke: mapping our early stages of recovery book  Does not apply Once  . apixaban  2.5 mg Oral BID  . diltiazem  360 mg Oral Daily  . donepezil  10 mg Oral QHS  . DULoxetine  60 mg Oral QHS  . famotidine (PEPCID) IV  20 mg Intravenous Q12H  . furosemide  40 mg Intravenous BID  . latanoprost  1 drop Both Eyes QHS  . metoprolol tartrate  100 mg Oral BID  . pantoprazole  40 mg Oral Daily  . phenytoin  100 mg Oral Daily  . phenytoin  200 mg Oral QHS  . risperiDONE  1 mg Oral BID  . sodium chloride flush  3 mL Intravenous Q12H    Family History  Family History  Problem Relation Age of Onset  . Hypertension Mother   . Heart disease Mother   . Heart disease Father   . Cancer Sister     breast  . Diabetes Sister   . Heart disease Son   . Hypertension Son   . Thyroid disease Sister     Social History  Social History   Social History  . Marital status: Widowed    Spouse name: N/A  . Number of children: N/A  . Years of education: N/A   Occupational History  . Not on file.   Social History Main Topics  . Smoking status: Never Smoker  . Smokeless tobacco: Never Used  . Alcohol use No  . Drug use: Unknown  . Sexual activity: No   Other Topics  Concern  . Not on file   Social History Narrative  . No narrative on file     Review of Systems General:  No chills, fever, night sweats or weight changes.  Cardiovascular:  No chest pain, dyspnea on exertion, edema, orthopnea, palpitations, paroxysmal nocturnal dyspnea. Dermatological: No rash, lesions/masses Respiratory: No cough, dyspnea Urologic: No hematuria, dysuria Abdominal:   No nausea, vomiting, diarrhea, bright red blood per rectum, melena, or hematemesis Neurologic:  No visual changes, wkns, changes in mental status. All other systems reviewed and are otherwise negative except as noted above.  Physical Exam  Blood pressure (!) 114/91, pulse 96, temperature 98 F (36.7 C), temperature source Oral, resp. rate 18, height 5\' 7"  (1.702 m), weight 155 lb 3.3 oz (70.4 kg), SpO2 98 %.  General: Pleasant, NAD, elderly, pleasantly demented  Psych: Normal affect. Neuro: Alert and oriented X 3. Moves all extremities spontaneously. HEENT: Normal  Neck: Supple without bruits or JVD. Lungs:  Resp regular and unlabored, CTA. Heart: irregularly irregular, tachy rate no s3, s4, or murmurs. Abdomen: Soft, non-tender, non-distended, BS + x 4.  Extremities: No clubbing or cyanosis, 1+ bilateral pedal edema R>L. Marland Kitchen. DP/PT/Radials 2+ and equal bilaterally.  Labs  Troponin (Point of Care Test) No results for input(s): TROPIPOC in the last 72 hours. No results for input(s): CKTOTAL, CKMB, TROPONINI in the last 72 hours. Lab Results  Component Value Date   WBC 4.9 10/05/2015   HGB 9.8 (L) 10/05/2015   HCT 28.6 (L) 10/05/2015   MCV 85.6 10/05/2015   PLT 140 (L) 10/05/2015    Recent Labs Lab 10/03/15 0415  10/05/15 0522  NA 143  < > 140  K 2.9*  < > 3.7  CL 106  < > 105  CO2 32  < > 29  BUN 22*  < > 17  CREATININE 0.93  < > 1.04*  CALCIUM 7.9*  < > 8.4*  PROT 5.4*  --   --   BILITOT 0.7  --   --  ALKPHOS 111  --   --   ALT 12*  --   --   AST 19  --   --   GLUCOSE 103*  < >  111*  < > = values in this interval not displayed. Lab Results  Component Value Date   CHOL 74 10/04/2015   HDL 31 (L) 10/04/2015   LDLCALC 28 10/04/2015   TRIG 74 10/04/2015   No results found for: DDIMER   Radiology/Studies  Mr Maxine Glenn Head Wo Contrast  Result Date: 10/03/2015 CLINICAL DATA:  Code stroke. Acute mental status changes beginning 90 minutes ago. Lethargy. Left-sided numbness. EXAM: MRI HEAD WITHOUT CONTRAST MRA HEAD WITHOUT CONTRAST TECHNIQUE: Multiplanar, multiecho pulse sequences of the brain and surrounding structures were obtained without intravenous contrast. Angiographic images of the head were obtained using MRA technique without contrast. COMPARISON:  None. FINDINGS: MRI HEAD FINDINGS Brain: Diffusion imaging does not show any acute or subacute infarction. The brainstem is normal. There is cerebellar atrophy. Cerebral hemispheres show generalized age related atrophy with chronic small-vessel ischemic change of the deep white matter. No large vessel territory infarction. No mass lesion, hemorrhage, hydrocephalus or extra-axial collection. No pituitary mass. Vascular: Major vessels are patent at the base of the brain. Skull and upper cervical spine: Negative Sinuses/Orbits: Small mastoid effusions. Other: None significant MRA HEAD FINDINGS Both internal carotid arteries are widely patent through the skullbase. On the left, the anterior and middle cerebral vessels are patent without proximal stenosis, aneurysm or vascular malformation. More distal branch vessels show atherosclerotic irregularity. On the right, the anterior cerebral branches are patent. There is what I believe is artifactual signal loss within the right middle cerebral artery territory related to downward tortuosity. In evaluating the source images, that vessel appears to be widely patent. Both vertebral arteries are widely patent to the basilar. No basilar stenosis. Posterior circulation branch vessels are normal.  IMPRESSION: No acute infarction. Generalized age related atrophy and chronic small vessel change of the cerebral hemispheric deep white matter. Intracranial MR angiography shows patency of the large and medium size vessels. There is artifactual signal loss within the right middle cerebral artery related to downward tortuosity. Evaluation of the source images shows that vessel to be patent. The patient does have some distal vessel atherosclerotic irregularity. Electronically Signed   By: Paulina Fusi M.D.   On: 10/03/2015 12:15   Mr Brain Wo Contrast  Result Date: 10/03/2015 CLINICAL DATA:  Code stroke. Acute mental status changes beginning 90 minutes ago. Lethargy. Left-sided numbness. EXAM: MRI HEAD WITHOUT CONTRAST MRA HEAD WITHOUT CONTRAST TECHNIQUE: Multiplanar, multiecho pulse sequences of the brain and surrounding structures were obtained without intravenous contrast. Angiographic images of the head were obtained using MRA technique without contrast. COMPARISON:  None. FINDINGS: MRI HEAD FINDINGS Brain: Diffusion imaging does not show any acute or subacute infarction. The brainstem is normal. There is cerebellar atrophy. Cerebral hemispheres show generalized age related atrophy with chronic small-vessel ischemic change of the deep white matter. No large vessel territory infarction. No mass lesion, hemorrhage, hydrocephalus or extra-axial collection. No pituitary mass. Vascular: Major vessels are patent at the base of the brain. Skull and upper cervical spine: Negative Sinuses/Orbits: Small mastoid effusions. Other: None significant MRA HEAD FINDINGS Both internal carotid arteries are widely patent through the skullbase. On the left, the anterior and middle cerebral vessels are patent without proximal stenosis, aneurysm or vascular malformation. More distal branch vessels show atherosclerotic irregularity. On the right, the anterior cerebral branches are patent. There  is what I believe is artifactual signal  loss within the right middle cerebral artery territory related to downward tortuosity. In evaluating the source images, that vessel appears to be widely patent. Both vertebral arteries are widely patent to the basilar. No basilar stenosis. Posterior circulation branch vessels are normal. IMPRESSION: No acute infarction. Generalized age related atrophy and chronic small vessel change of the cerebral hemispheric deep white matter. Intracranial MR angiography shows patency of the large and medium size vessels. There is artifactual signal loss within the right middle cerebral artery related to downward tortuosity. Evaluation of the source images shows that vessel to be patent. The patient does have some distal vessel atherosclerotic irregularity. Electronically Signed   By: Paulina Fusi M.D.   On: 10/03/2015 12:15   Ct Abdomen Pelvis W Contrast  Result Date: 10/01/2015 CLINICAL DATA:  Nursing home patient with dementia, presenting with fecal emesis. Evaluate for bowel obstruction. EXAM: CT ABDOMEN AND PELVIS WITH CONTRAST TECHNIQUE: Multidetector CT imaging of the abdomen and pelvis was performed using the standard protocol following bolus administration of intravenous contrast. CONTRAST:  43mL ISOVUE-300 IOPAMIDOL (ISOVUE-300) INJECTION 61% COMPARISON:  None. FINDINGS: Lower chest: The heart is enlarged. There is atherosclerosis of the coronary arteries. No significant pleural or pericardial effusion. No confluent basilar airspace disease. Hepatobiliary: No focal hepatic abnormalities are identified. There is no significant biliary dilatation status post cholecystectomy. Pancreas: Diffusely atrophied. No evidence of pancreatic mass, ductal dilatation or surrounding inflammation. Spleen: Normal in size without focal abnormality. Adrenals/Urinary Tract: Both adrenal glands appear normal. Both kidneys demonstrate cortical thinning. There are cysts in the lower pole of the right kidney, measuring up to 3.2 cm in  diameter. No evidence of renal mass, urinary tract calculus or hydronephrosis. The bladder appears unremarkable for its degree of distention. Stomach/Bowel: Small hiatal hernia. The stomach, proximal and mid small bowel are mildly dilated with multiple air-fluid levels. The distal small bowel is decompressed. The transition point is suspected to be in the false pelvis (axial image 56). No surrounding soft tissue mass or fluid collection is apparent in this area. The colon is decompressed. No apparent fistula or focal extraluminal fluid collection. There is a small amount of free pelvic fluid. Vascular/Lymphatic: There are no enlarged abdominal or pelvic lymph nodes. Patient has an aorto bi-iliac stent graft which appears patent. 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 No evidence of endograft leak. Both internal iliac arteries appear thrombosed status post probable embolization on the right. The external iliac arteries are patent. IVC filter noted with mild strut penetration. Reproductive: Hysterectomy.  No evidence of adnexal mass. Other: There is a small amount of ascites. There is generalized edema throughout the mesenteric and subcutaneous fat. No focal extraluminal fluid collections are seen. Musculoskeletal: No acute or significant osseous findings. The bones are demineralized. There is a possible sacral decubitus ulcer. No evidence of bone destruction. Lower lumbar spondylosis noted. IMPRESSION: 1. Findings are consistent with a mid to distal small bowel obstruction. Transition point appears to be in the false pelvis without discernible cause, suggesting adhesions. 2. No fistula or abscess identified. 3. Generalized soft tissue edema with mild pelvic ascites. 4. Patent aortic stent graft. Electronically Signed   By: Carey Bullocks M.D.   On: 10/01/2015 13:50   Dg Chest Port 1 View  Result Date: 10/03/2015 CLINICAL DATA:  Abnormal breath sounds. EXAM:  PORTABLE CHEST 1 VIEW COMPARISON:  10/01/2015. FINDINGS: Cardiomegaly with pulmonary vascular  prominence. Mild right perihilar interstitial prominence noted. Mild interstitial edema and/or pneumonitis cannot be excluded. Left pleural effusion. No pneumothorax IMPRESSION: 1.  Interim removal of NG tube. 2. Cardiomegaly with mild pulmonary venous congestion. Mild right perihilar interstitial prominence. Mild perihilar pulmonary edema and/or pneumonitis cannot be excluded. Electronically Signed   By: Maisie Fus  Register   On: 10/03/2015 11:21   Dg Chest Portable 1 View  Result Date: 10/01/2015 CLINICAL DATA:  Nasogastric tube placement. Decreased oxygen saturation. EXAM: PORTABLE CHEST 1 VIEW COMPARISON:  None. FINDINGS: Nasogastric tube tip and side port are below the diaphragm. No pneumothorax. There is fine nodular interstitial disease throughout the lungs with a mid to lower lung lung zone predominance. There is no airspace consolidation or volume loss. The heart size and pulmonary vascular normal. No adenopathy. There is atherosclerotic calcification in the aorta. There is degenerative change in each shoulder. IMPRESSION: Nasogastric tube tip and side port below the diaphragm. Fine nodular interstitial disease throughout the lungs of uncertain etiology or chronicity. No frank edema or consolidation. Cardiac silhouette within normal limits. There is aortic atherosclerosis. Electronically Signed   By: Bretta Bang III M.D.   On: 10/01/2015 14:21   Dg Abd Portable 1v-small Bowel Obstruction Protocol-initial, 8 Hr Delay  Result Date: 10/02/2015 CLINICAL DATA:  Small bowel obstruction.  8 hour post contrast film. EXAM: PORTABLE ABDOMEN - 1 VIEW COMPARISON:  10/01/2015 FINDINGS: Enteric tube tip is in the right upper quadrant, likely in the distal stomach or duodenum bulb region. Gaseous distention of the stomach. Contrast material is demonstrated throughout the colon suggesting no evidence of complete small  bowel obstruction. Vascular stents and coils. Inferior vena caval filter. Surgical clips in the right abdomen. IMPRESSION: Contrast material is present in the colon suggesting no evidence of complete small bowel obstruction. Electronically Signed   By: Burman Nieves M.D.   On: 10/02/2015 04:40   Dg Abd Portable 1v-small Bowel Protocol-position Verification  Result Date: 10/01/2015 CLINICAL DATA:  NG tube placement. EXAM: PORTABLE ABDOMEN - 1 VIEW COMPARISON:  CT 10/01/2015 FINDINGS: NG tube is in place with the tip in the perihilar region, possibly distal stomach or proximal duodenum. Side port is in the distal stomach. Decreasing small bowel dilatation. Prior cholecystectomy. No free air. IMPRESSION: NG tube tip in the peripyloric region, possibly distal stomach or proximal duodenum. Decreasing small bowel dilatation. Electronically Signed   By: Charlett Nose M.D.   On: 10/01/2015 16:21    ECG  Atrial fibrillation with RVR   Echocardiogram [facility] 01/13/15  Findings Mitral Valve Structurally normal mitral valve. Mild to Moderate eccentric mitral regurgitation by color flow doppler examination. Aortic Valve Structurally normal aortic valve with good leaflet mobility, and mild regurgitation by color flow doppler examination. Tricuspid Valve Tricuspid valve is structurally normal. Moderate tricuspid regurgitation by color flow doppler examination. Mild pulmonary hypertension. Pulmonic Valve The pulmonic valve was not well visualized No Doppler evidence of pulmonic stenosis or insufficiency. Left Atrium Left atrial volume index of 48.1 ml per meters squared BSA. Moderately dilated left atrium. IAS bows back and forth Left Ventricle Mild left ventricular hypertrophy Normal left ventricular size and systolic function with no appreciable segmental abnormality. Ejection fraction is visually estimated at 50-55% Right Atrium Normal right atrium. Right Ventricle Normal right ventricle  structure and function. Pericardial Effusion No evidence of pericardial effusion. Miscellaneous The IVC is dilated The aorta is within normal limits.    ASSESSMENT AND PLAN  Principal Problem:   SBO (small bowel obstruction) (  HCC) Active Problems:   Dementia with behavioral disturbance   Atrial fibrillation with RVR (HCC)   Personal history of PE (pulmonary embolism)   History of CVA (cerebrovascular accident)   Personal history of DVT (deep vein thrombosis)   Chronic CHF (congestive heart failure) (HCC)   Acute on chronic diastolic heart failure (HCC)   Normocytic anemia   Thrombocytopenia (HCC)   TIA (transient ischemic attack)   1. Atrial Fibrillation w/ RVR: followed by Dr. [name] at [facility]. Family belives she was first diagnosed with afib ~1 year ago. Outpatient cardiology records not available in Epic. It appears that she has been managed with rate control strategy, on 2 agents, Cardizem and metoprolol. She is on a/c for Eliquis. CHA2DS2 VASc score is 8 (CHF, HTN, Age >75, Stroke, Vas dz and Female). Normal EF by echo 12/2014 (in Care Everywhere). Her SBO obstruction has resolved but rate remains elevated, despite increased dose of metoprolol from 75 mg BID to 100 mg BID. She is on high dose of Cardizem CD, 360 mg. Resting rate is in the 110s. Can further increase Cardizem to 480 mg if BP allows. Given concerns for TIA and possible LA thrombus, would avoid use of amiodarone as this could lead to chemical conversion. Continue Eliquis for a/c.   2. Acute on Chronic Diastolic HF: lungs are clear. She denies dyspnea. No supplemental O2 requirements. She does however have mild peripheral edema with 1+ pedal edema. Renal function and K both stable. Continue lasix. Continue BB for diastolic dysfunction.   3. SBO: resolved with conservative measures. NGT removed. Bowels moving.   4. TIA: MRI/MRA negative. Symptoms resolved. Continue Eliquis for a/c.   5. Dementia: per IM.  Plans on returning to nursing facility post discharge.    Signed, Robbie Lis, PA-C 10/05/2015, 11:38 AM   Personally seen and examined. Agree with above.  82 year old female with resolved small bowel obstruction, possible TIA with negative MRA MRI on Eliquis, dementia with atrial fibrillation chronic.   - Atrial fibrillation at rest seems to be under adequate rate control. Let us try to maintain a heart rate less than 110. I do not think that we need to add any further medication. Continue with combination of diltiazem and beta blocker. I would try to avoid amiodarone unless absolutely needed.  - Continue with Eliquis. Watch for any signs of bleeding. Heart: Irregularly irregular with normal rate at rest comfortable in bed, normal respiratory effort with 1+ pitting edema lower extremities.  We will follow along with you. Spoke to the family at length.  Donato Schultz, MD

## 2015-10-05 NOTE — Care Management Important Message (Signed)
Important Message  Patient Details  Name: Brandi Heath MRN: [id number] Date of Birth: [birth year]   Medicare Important Message Given:  Yes    Haskell Flirt 10/05/2015, 9:31 AMImportant Message  Patient Details  Name: Brandi Heath MRN: [id number] Date of Birth: 17-Sep-1927   Medicare Important Message Given:  Yes    Haskell Flirt 10/05/2015, 9:31 AM

## 2015-10-05 NOTE — Progress Notes (Signed)
ANTICOAGULATION CONSULT NOTE  Pharmacy Consult for Eliquis Indication: atrial fibrillation  Allergies  Allergen Reactions  . Chocolate Diarrhea  . Lactose Intolerance (Gi)   . Penicillins     From [facility]    Patient Measurements: Height: 5\' 7"  (170.2 cm) Weight: 155 lb 3.3 oz (70.4 kg) IBW/kg (Calculated) : 61.6  Vital Signs: Temp: 97.6 F (36.4 C) (09/22 1420) Temp Source: Oral (09/22 1420) BP: 110/65 (09/22 1420) Pulse Rate: 77 (09/22 1420)  Labs:  Recent Labs  10/03/15 0415 10/04/15 0405 10/05/15 0522  HGB 9.7* 9.2* 9.8*  HCT 29.2* 27.3* 28.6*  PLT 135* 122* 140*  CREATININE 0.93 0.96 1.04*    Estimated Creatinine Clearance: 37.8 mL/min (by C-G formula based on SCr of 1.04 mg/dL (H)).  Assessment: 82 yo F on chronic Eliquis 2.5mg  po BID for Afib.  She also has a remote hx of VTE.  Eliquis was held on admission due to heme positive gastric fluid.  There was a concern for acute CVA while off Eliquis, however head CT was negative for an acute event.   Hg continues to trend down, however no evidence of active bleeding.  Pharmacy asked to resume Eliquis today.   Of note, patient is on both phenytoin & Eliquis PTA.  The use of DOACs is not recommended in combination with phenytoin due to Cyp 3A4/PG interactions which can cause sub-therapeutic anticoagulation effects.    Today, 10/05/2015:  CBC: Hgb, plt both low but improved from yesterday  SCr: stable wnl  Previous anticoagulation: Eliquis, last dose given at 1050 today   Goal of Therapy: Anti-Xa level 0.6-1 units/ml 4hrs after LMWH dose given  Plan:  Lovenox 70 mg SQ q12 hr  Monitor for signs of bleeding or worsening thrombosis  F/u plans for long-term anticoag   Bernadene Person, PharmD, BCPS Pager: [phone] 10/05/2015, 3:40 PM

## 2015-10-05 NOTE — Discharge Instructions (Signed)
Transient Ischemic Attack °A transient ischemic attack (TIA) is a "warning stroke" that causes stroke-like symptoms. Unlike a stroke, a TIA does not cause permanent damage to the brain. The symptoms of a TIA can happen very fast and do not last long. It is important to know the symptoms of a TIA and what to do. This can help prevent a major stroke or death. °CAUSES  °A TIA is caused by a temporary blockage in an artery in the brain or neck (carotid artery). The blockage does not allow the brain to get the blood supply it needs and can cause different symptoms. The blockage can be caused by either: °· A blood clot. °· Fatty buildup (plaque) in a neck or brain artery. °RISK FACTORS °· High blood pressure (hypertension). °· High cholesterol. °· Diabetes mellitus. °· Heart disease. °· The buildup of plaque in the blood vessels (peripheral artery disease or atherosclerosis). °· The buildup of plaque in the blood vessels that provide blood and oxygen to the brain (carotid artery stenosis). °· An abnormal heart rhythm (atrial fibrillation). °· Obesity. °· Using any tobacco products, including cigarettes, chewing tobacco, or electronic cigarettes. °· Taking oral contraceptives, especially in combination with using tobacco. °· Physical inactivity. °· A diet high in fats, salt (sodium), and calories. °· Excessive alcohol use. °· Use of illegal drugs (especially cocaine and methamphetamine). °· Being female. °· Being African American. °· Being over the age of 55 years. °· Family history of stroke. °· Previous history of blood clots, stroke, TIA, or heart attack. °· Sickle cell disease. °SIGNS AND SYMPTOMS  °TIA symptoms are the same as a stroke but are temporary. These symptoms usually develop suddenly, or may be newly present upon waking from sleep: °· Sudden weakness or numbness of the face, arm, or leg, especially on one side of the body. °· Sudden trouble walking or difficulty moving arms or legs. °· Sudden  confusion. °· Sudden personality changes. °· Trouble speaking (aphasia) or understanding. °· Difficulty swallowing. °· Sudden trouble seeing in one or both eyes. °· Double vision. °· Dizziness. °· Loss of balance or coordination. °· Sudden severe headache with no known cause. °· Trouble reading or writing. °· Loss of bowel or bladder control. °· Loss of consciousness. °DIAGNOSIS  °Your health care provider may be able to determine the presence or absence of a TIA based on your symptoms, history, and physical exam. CT scan of the brain is usually performed to help identify a TIA. Other tests may include: °· Electrocardiography (ECG). °· Continuous heart monitoring. °· Echocardiography. °· Carotid ultrasonography. °· MRI. °· A scan of the brain circulation. °· Blood tests. °TREATMENT  °Since the symptoms of TIA are the same as a stroke, it is important to seek treatment as soon as possible. You may need a medicine to dissolve a blood clot (thrombolytic) if that is the cause of the TIA. This medicine cannot be given if too much time has passed. Treatment may also include:  °· Rest, oxygen, fluids through an IV tube, and medicines to thin the blood (anticoagulants). °· Measures will be taken to prevent short-term and long-term complications, including infection from breathing foreign material into the lungs (aspiration pneumonia), blood clots in the legs, and falls. °· Procedures to either remove plaque in the carotid arteries or dilate carotid arteries that have narrowed due to plaque. Those procedures are: °¨ Carotid endarterectomy. °¨ Carotid angioplasty and stenting. °· Medicines and diet may be used to address diabetes, high blood pressure, and   other underlying risk factors. °HOME CARE INSTRUCTIONS  °· Take medicines only as directed by your health care provider. Follow the directions carefully. Medicines may be used to control risk factors for a stroke. Be sure you understand all your medicine instructions. °· You  may be told to take aspirin or the anticoagulant warfarin. Warfarin needs to be taken exactly as instructed. °¨ Taking too much or too little warfarin is dangerous. Too much warfarin increases the risk of bleeding. Too little warfarin continues to allow the risk for blood clots. While taking warfarin, you will need to have regular blood tests to measure your blood clotting time. A PT blood test measures how long it takes for blood to clot. Your PT is used to calculate another value called an INR. Your PT and INR help your health care provider to adjust your dose of warfarin. The dose can change for many reasons. It is critically important that you take warfarin exactly as prescribed. °¨ Many foods, especially foods high in vitamin K can interfere with warfarin and affect the PT and INR. Foods high in vitamin K include spinach, kale, broccoli, cabbage, collard and turnip greens, Brussels sprouts, peas, cauliflower, seaweed, and parsley, as well as beef and pork liver, green tea, and soybean oil. You should eat a consistent amount of foods high in vitamin K. Avoid major changes in your diet, or notify your health care provider before changing your diet. Arrange a visit with a dietitian to answer your questions. °¨ Many medicines can interfere with warfarin and affect the PT and INR. You must tell your health care provider about any and all medicines you take; this includes all vitamins and supplements. Be especially cautious with aspirin and anti-inflammatory medicines. Do not take or discontinue any prescribed or over-the-counter medicine except on the advice of your health care provider or pharmacist. °¨ Warfarin can have side effects, such as excessive bruising or bleeding. You will need to hold pressure over cuts for longer than usual. Your health care provider or pharmacist will discuss other potential side effects. °¨ Avoid sports or activities that may cause injury or bleeding. °¨ Be careful when shaving,  flossing your teeth, or handling sharp objects. °¨ Alcohol can change the body's ability to handle warfarin. It is best to avoid alcoholic drinks or consume only very small amounts while taking warfarin. Notify your health care provider if you change your alcohol intake. °¨ Notify your dentist or other health care providers before procedures. °· Eat a diet that includes 5 or more servings of fruits and vegetables each day. This may reduce the risk of stroke. Certain diets may be prescribed to address high blood pressure, high cholesterol, diabetes, or obesity. °¨ A diet low in sodium, saturated fat, trans fat, and cholesterol is recommended to manage high blood pressure. °¨ A diet low in saturated fat, trans fat, and cholesterol, and high in fiber may control cholesterol levels. °¨ A controlled-carbohydrate, controlled-sugar diet is recommended to manage diabetes. °¨ A reduced-calorie diet that is low in sodium, saturated fat, trans fat, and cholesterol is recommended to manage obesity. °· Maintain a healthy weight. °· Stay physically active. It is recommended that you get at least 30 minutes of activity on most or all days. °· Do not use any tobacco products, including cigarettes, chewing tobacco, or electronic cigarettes. If you need help quitting, ask your health care provider. °· Limit alcohol intake to no more than 1 drink per day for nonpregnant women and 2 drinks   per day for men. One drink equals 12 ounces of beer, 5 ounces of wine, or 1 ounces of hard liquor.  Do not abuse drugs.  A safe home environment is important to reduce the risk of falls. Your health care provider may arrange for specialists to evaluate your home. Having grab bars in the bedroom and bathroom is often important. Your health care provider may arrange for equipment to be used at home, such as raised toilets and a seat for the shower.  Follow all instructions for follow-up with your health care provider. This is very important.  This includes any referrals and lab tests. Proper follow-up can prevent a stroke or another TIA from occurring. PREVENTION  The risk of a TIA can be decreased by appropriately treating high blood pressure, high cholesterol, diabetes, heart disease, and obesity, and by quitting smoking, limiting alcohol, and staying physically active. SEEK MEDICAL CARE IF:  You have personality changes.  You have difficulty swallowing.  You are seeing double.  You have dizziness.  You have a fever. SEEK IMMEDIATE MEDICAL CARE IF:  Any of the following symptoms may represent a serious problem that is an emergency. Do not wait to see if the symptoms will go away. Get medical help right away. Call your local emergency services (911 in [place] Do not drive yourself to the hospital.  You have sudden weakness or numbness of the face, arm, or leg, especially on one side of the body.  You have sudden trouble walking or difficulty moving arms or legs.  You have sudden confusion.  You have trouble speaking (aphasia) or understanding.  You have sudden trouble seeing in one or both eyes.  You have a loss of balance or coordination.  You have a sudden, severe headache with no known cause.  You have new chest pain or an irregular heartbeat.  You have a partial or total loss of consciousness. MAKE SURE YOU:   Understand these instructions.  Will watch your condition.  Will get help right away if you are not doing well or get worse.

## 2015-10-06 ENCOUNTER — Inpatient Hospital Stay (HOSPITAL_COMMUNITY): Payer: Medicare Other

## 2015-10-06 DIAGNOSIS — G459 Transient cerebral ischemic attack, unspecified: Secondary | ICD-10-CM

## 2015-10-06 LAB — BASIC METABOLIC PANEL
Anion gap: 7 (ref 5–15)
BUN: 15 mg/dL (ref 4–21)
BUN: 15 mg/dL (ref 6–20)
CO2: 31 mmol/L (ref 22–32)
CREATININE: 0.9 mg/dL (ref 0.44–1.00)
Calcium: 8.4 mg/dL — ABNORMAL LOW (ref 8.9–10.3)
Chloride: 101 mmol/L (ref 101–111)
Creatinine: 0.9 mg/dL (ref 0.5–1.1)
GFR calc Af Amer: >60 mL/min (ref >60)
GFR, EST NON AFRICAN AMERICAN: 56 mL/min — AB (ref >60)
Glucose, Bld: 96 mg/dL (ref 65–99)
Glucose: 96 mg/dL
POTASSIUM: 3 mmol/L — AB (ref 3.4–5.3)
Potassium: 3 mmol/L — ABNORMAL LOW (ref 3.5–5.1)
SODIUM: 139 mmol/L (ref 135–145)
Sodium: 139 mmol/L (ref 137–147)

## 2015-10-06 LAB — CBC
HCT: 30.2 % — ABNORMAL LOW (ref 36.0–46.0)
Hemoglobin: 10.3 g/dL — ABNORMAL LOW (ref 12.0–15.0)
MCH: 29 pg (ref 26.0–34.0)
MCHC: 34.1 g/dL (ref 30.0–36.0)
MCV: 85.1 fL (ref 78.0–100.0)
PLATELETS: 176 10*3/uL (ref 150–400)
RBC: 3.55 MIL/uL — ABNORMAL LOW (ref 3.87–5.11)
RDW: 15.1 % (ref 11.5–15.5)
WBC: 6.2 10*3/uL (ref 4.0–10.5)

## 2015-10-06 LAB — CBC AND DIFFERENTIAL
HEMATOCRIT: 30 % — AB (ref 36–46)
HEMOGLOBIN: 10.3 g/dL — AB (ref 12.0–16.0)
PLATELETS: 176 10*3/uL (ref 150–399)
WBC: 6.2 10*3/mL

## 2015-10-06 LAB — ECHOCARDIOGRAM COMPLETE
Height: 67 in
Weight: 2483.26 oz

## 2015-10-06 MED ORDER — FUROSEMIDE 40 MG PO TABS: 80.0000 mg | ORAL_TABLET | Freq: Two times a day (BID) | ORAL | Status: DC

## 2015-10-06 MED ORDER — FUROSEMIDE 40 MG PO TABS
80.0000 mg | ORAL_TABLET | Freq: Two times a day (BID) | ORAL | Status: DC
  Administered 2015-10-06 – 2015-10-07 (×2): 80 mg via ORAL
  Filled 2015-10-06 (×3): qty 2

## 2015-10-06 MED ORDER — POTASSIUM CHLORIDE CRYS ER 20 MEQ PO TBCR
40.0000 meq | EXTENDED_RELEASE_TABLET | Freq: Once | ORAL | Status: AC
  Administered 2015-10-06: 40 meq via ORAL
  Filled 2015-10-06: qty 2

## 2015-10-06 MED ORDER — FAMOTIDINE 20 MG PO TABS
20.0000 mg | ORAL_TABLET | Freq: Two times a day (BID) | ORAL | Status: DC
  Administered 2015-10-06 – 2015-10-08 (×5): 20 mg via ORAL
  Filled 2015-10-06 (×5): qty 1

## 2015-10-06 NOTE — Progress Notes (Signed)
*  PRELIMINARY RESULTS* Vascular Ultrasound Carotid Duplex (Doppler) has been completed.  Preliminary findings: Bilateral: No significant (1-39%) ICA stenosis. Antegrade vertebral flow.   Farrel Demark, RDMS, RVT  10/06/2015, 8:58 AM

## 2015-10-06 NOTE — Progress Notes (Signed)
Patient Name: Brandi Heath Date of Encounter: 10/06/2015  Hospital Problem List     Principal Problem:   SBO (small bowel obstruction) (HCC) Active Problems:   Dementia with behavioral disturbance   Atrial fibrillation with rapid ventricular response (HCC)   Personal history of PE (pulmonary embolism)   History of CVA (cerebrovascular accident)   Personal history of DVT (deep vein thrombosis)   Chronic CHF (congestive heart failure) (HCC)   Acute on chronic diastolic heart failure (HCC)   Normocytic anemia   Thrombocytopenia (HCC)   TIA (transient ischemic attack)   Left sided numbness    Patient Profile     82 y/o female nursing home resident, with h/o dementia, previous h/o SBO, chronic atrial fibrillation on chronic anticoagulation, h/o PE/DVT, stroke and seizure disorder, admitted for severe n/v, found to have recurrent SBO by CT scan. Cardiology consulted for atrial fibrillation w/ RVR.  Also felt to have a/c diastolic CHF and possible TIA (Suddenleft sided numbness and worsening slurred speech - symptoms resolved).     Subjective   She is confused but denies any acute symptoms.  No SOB.  No pain.    Inpatient Medications    .  stroke: mapping our early stages of recovery book   Does not apply Once  . diltiazem  360 mg Oral Daily  . donepezil  10 mg Oral QHS  . DULoxetine  60 mg Oral QHS  . enoxaparin (LOVENOX) injection  1 mg/kg Subcutaneous Q12H  . famotidine  20 mg Oral BID  . furosemide  40 mg Intravenous BID  . latanoprost  1 drop Both Eyes QHS  . metoprolol tartrate  100 mg Oral BID  . pantoprazole  40 mg Oral Daily  . phenytoin  100 mg Oral Daily  . phenytoin  200 mg Oral QHS  . potassium chloride  40 mEq Oral Once  . risperiDONE  1 mg Oral BID  . sodium chloride flush  3 mL Intravenous Q12H    Vital Signs    Vitals:   10/05/15 0541 10/05/15 1420 10/05/15 2100 10/06/15 0522  BP: (!) 114/91 110/65 (!) 93/53 111/61  Pulse: 96 77 79 73  Resp: 18 18  19 18   Temp: 98 F (36.7 C) 97.6 F (36.4 C) 97.7 F (36.5 C) 97.9 F (36.6 C)  TempSrc: Oral Oral Oral Oral  SpO2: 98% 98% 97% 100%  Weight:      Height:        Intake/Output Summary (Last 24 hours) at 10/06/15 0934 Last data filed at 10/05/15 2211  Gross per 24 hour  Intake              290 ml  Output                0 ml  Net              290 ml   Filed Weights   10/02/15 0353 10/03/15 0636 10/04/15 0500  Weight: 150 lb 2.1 oz (68.1 kg) 147 lb 4.3 oz (66.8 kg) 155 lb 3.3 oz (70.4 kg)    Physical Exam    GEN: Well nourished, well developed, in no no acute distress.  Neck: Supple, no JVD, carotid bruits, or masses. Cardiac: Irregular, no rubs, or gallops. No clubbing, cyanosis, no edema.  Radials/DP/PT 2+ and equal bilaterally.  Respiratory:  Respirations  regular and unlabored, clear to auscultation bilaterally. GI: Soft, nontender, nondistended, BS + x 4. Neuro:  Strength and sensation are intact.  Labs    CBC  Recent Labs  10/05/15 0522 10/06/15 0548  WBC 4.9 6.2  HGB 9.8* 10.3*  HCT 28.6* 30.2*  MCV 85.6 85.1  PLT 140* 176   Basic Metabolic Panel  Recent Labs  10/05/15 0522 10/06/15 0548  NA 140 139  K 3.7 3.0*  CL 105 101  CO2 29 31  GLUCOSE 111* 96  BUN 17 15  CREATININE 1.04* 0.90  CALCIUM 8.4* 8.4*   Liver Function Tests No results for input(s): AST, ALT, ALKPHOS, BILITOT, PROT, ALBUMIN in the last 72 hours. No results for input(s): LIPASE, AMYLASE in the last 72 hours. Cardiac Enzymes No results for input(s): CKTOTAL, CKMB, CKMBINDEX, TROPONINI in the last 72 hours. BNP Invalid input(s): POCBNP D-Dimer No results for input(s): DDIMER in the last 72 hours. Hemoglobin A1C  Recent Labs  10/04/15 0405  HGBA1C 5.8*   Fasting Lipid Panel  Recent Labs  10/04/15 0405  CHOL 74  HDL 31*  LDLCALC 28  TRIG 74  CHOLHDL 2.4   Thyroid Function Tests  Recent Labs  10/05/15 0522  TSH 1.522    Telemetry    Atrial fib:  Rate  is for the most part controlled.  It does go up with stimulation.    ECG    NA  Radiology    Mr Shirlee Latch NF Contrast  Result Date: 10/03/2015 CLINICAL DATA:  Code stroke. Acute mental status changes beginning 90 minutes ago. Lethargy. Left-sided numbness. EXAM: MRI HEAD WITHOUT CONTRAST MRA HEAD WITHOUT CONTRAST TECHNIQUE: Multiplanar, multiecho pulse sequences of the brain and surrounding structures were obtained without intravenous contrast. Angiographic images of the head were obtained using MRA technique without contrast. COMPARISON:  None. FINDINGS: MRI HEAD FINDINGS Brain: Diffusion imaging does not show any acute or subacute infarction. The brainstem is normal. There is cerebellar atrophy. Cerebral hemispheres show generalized age related atrophy with chronic small-vessel ischemic change of the deep white matter. No large vessel territory infarction. No mass lesion, hemorrhage, hydrocephalus or extra-axial collection. No pituitary mass. Vascular: Major vessels are patent at the base of the brain. Skull and upper cervical spine: Negative Sinuses/Orbits: Small mastoid effusions. Other: None significant MRA HEAD FINDINGS Both internal carotid arteries are widely patent through the skullbase. On the left, the anterior and middle cerebral vessels are patent without proximal stenosis, aneurysm or vascular malformation. More distal branch vessels show atherosclerotic irregularity. On the right, the anterior cerebral branches are patent. There is what I believe is artifactual signal loss within the right middle cerebral artery territory related to downward tortuosity. In evaluating the source images, that vessel appears to be widely patent. Both vertebral arteries are widely patent to the basilar. No basilar stenosis. Posterior circulation branch vessels are normal. IMPRESSION: No acute infarction. Generalized age related atrophy and chronic small vessel change of the cerebral hemispheric deep white matter.  Intracranial MR angiography shows patency of the large and medium size vessels. There is artifactual signal loss within the right middle cerebral artery related to downward tortuosity. Evaluation of the source images shows that vessel to be patent. The patient does have some distal vessel atherosclerotic irregularity. Electronically Signed   By: Paulina Fusi M.D.   On: 10/03/2015 12:15   Mr Brain Wo Contrast  Result Date: 10/03/2015 CLINICAL DATA:  Code stroke. Acute mental status changes beginning 90 minutes ago. Lethargy. Left-sided numbness. EXAM: MRI HEAD WITHOUT CONTRAST MRA HEAD WITHOUT CONTRAST TECHNIQUE: Multiplanar, multiecho pulse sequences of the brain and surrounding structures were obtained  without intravenous contrast. Angiographic images of the head were obtained using MRA technique without contrast. COMPARISON:  None. FINDINGS: MRI HEAD FINDINGS Brain: Diffusion imaging does not show any acute or subacute infarction. The brainstem is normal. There is cerebellar atrophy. Cerebral hemispheres show generalized age related atrophy with chronic small-vessel ischemic change of the deep white matter. No large vessel territory infarction. No mass lesion, hemorrhage, hydrocephalus or extra-axial collection. No pituitary mass. Vascular: Major vessels are patent at the base of the brain. Skull and upper cervical spine: Negative Sinuses/Orbits: Small mastoid effusions. Other: None significant MRA HEAD FINDINGS Both internal carotid arteries are widely patent through the skullbase. On the left, the anterior and middle cerebral vessels are patent without proximal stenosis, aneurysm or vascular malformation. More distal branch vessels show atherosclerotic irregularity. On the right, the anterior cerebral branches are patent. There is what I believe is artifactual signal loss within the right middle cerebral artery territory related to downward tortuosity. In evaluating the source images, that vessel appears to  be widely patent. Both vertebral arteries are widely patent to the basilar. No basilar stenosis. Posterior circulation branch vessels are normal. IMPRESSION: No acute infarction. Generalized age related atrophy and chronic small vessel change of the cerebral hemispheric deep white matter. Intracranial MR angiography shows patency of the large and medium size vessels. There is artifactual signal loss within the right middle cerebral artery related to downward tortuosity. Evaluation of the source images shows that vessel to be patent. The patient does have some distal vessel atherosclerotic irregularity. Electronically Signed   By: Paulina Fusi M.D.   On: 10/03/2015 12:15   Ct Abdomen Pelvis W Contrast  Result Date: 10/01/2015 CLINICAL DATA:  Nursing home patient with dementia, presenting with fecal emesis. Evaluate for bowel obstruction. EXAM: CT ABDOMEN AND PELVIS WITH CONTRAST TECHNIQUE: Multidetector CT imaging of the abdomen and pelvis was performed using the standard protocol following bolus administration of intravenous contrast. CONTRAST:  75mL ISOVUE-300 IOPAMIDOL (ISOVUE-300) INJECTION 61% COMPARISON:  None. FINDINGS: Lower chest: The heart is enlarged. There is atherosclerosis of the coronary arteries. No significant pleural or pericardial effusion. No confluent basilar airspace disease. Hepatobiliary: No focal hepatic abnormalities are identified. There is no significant biliary dilatation status post cholecystectomy. Pancreas: Diffusely atrophied. No evidence of pancreatic mass, ductal dilatation or surrounding inflammation. Spleen: Normal in size without focal abnormality. Adrenals/Urinary Tract: Both adrenal glands appear normal. Both kidneys demonstrate cortical thinning. There are cysts in the lower pole of the right kidney, measuring up to 3.2 cm in diameter. No evidence of renal mass, urinary tract calculus or hydronephrosis. The bladder appears unremarkable for its degree of distention.  Stomach/Bowel: Small hiatal hernia. The stomach, proximal and mid small bowel are mildly dilated with multiple air-fluid levels. The distal small bowel is decompressed. The transition point is suspected to be in the false pelvis (axial image 56). No surrounding soft tissue mass or fluid collection is apparent in this area. The colon is decompressed. No apparent fistula or focal extraluminal fluid collection. There is a small amount of free pelvic fluid. Vascular/Lymphatic: There are no enlarged abdominal or pelvic lymph nodes. Patient has an aorto bi-iliac stent graft which appears patent. 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 No evidence of endograft leak. Both internal iliac arteries appear thrombosed status post probable embolization on the right. The external iliac arteries are patent. IVC filter noted with mild strut penetration. Reproductive: Hysterectomy.  No evidence of  adnexal mass. Other: There is a small amount of ascites. There is generalized edema throughout the mesenteric and subcutaneous fat. No focal extraluminal fluid collections are seen. Musculoskeletal: No acute or significant osseous findings. The bones are demineralized. There is a possible sacral decubitus ulcer. No evidence of bone destruction. Lower lumbar spondylosis noted. IMPRESSION: 1. Findings are consistent with a mid to distal small bowel obstruction. Transition point appears to be in the false pelvis without discernible cause, suggesting adhesions. 2. No fistula or abscess identified. 3. Generalized soft tissue edema with mild pelvic ascites. 4. Patent aortic stent graft. Electronically Signed   By: Carey Bullocks M.D.   On: 10/01/2015 13:50   Dg Chest Port 1 View  Result Date: 10/03/2015 CLINICAL DATA:  Abnormal breath sounds. EXAM: PORTABLE CHEST 1 VIEW COMPARISON:  10/01/2015. FINDINGS: Cardiomegaly with pulmonary vascular prominence. Mild right perihilar interstitial  prominence noted. Mild interstitial edema and/or pneumonitis cannot be excluded. Left pleural effusion. No pneumothorax IMPRESSION: 1.  Interim removal of NG tube. 2. Cardiomegaly with mild pulmonary venous congestion. Mild right perihilar interstitial prominence. Mild perihilar pulmonary edema and/or pneumonitis cannot be excluded. Electronically Signed   By: Maisie Fus  Register   On: 10/03/2015 11:21   Dg Chest Portable 1 View  Result Date: 10/01/2015 CLINICAL DATA:  Nasogastric tube placement. Decreased oxygen saturation. EXAM: PORTABLE CHEST 1 VIEW COMPARISON:  None. FINDINGS: Nasogastric tube tip and side port are below the diaphragm. No pneumothorax. There is fine nodular interstitial disease throughout the lungs with a mid to lower lung lung zone predominance. There is no airspace consolidation or volume loss. The heart size and pulmonary vascular normal. No adenopathy. There is atherosclerotic calcification in the aorta. There is degenerative change in each shoulder. IMPRESSION: Nasogastric tube tip and side port below the diaphragm. Fine nodular interstitial disease throughout the lungs of uncertain etiology or chronicity. No frank edema or consolidation. Cardiac silhouette within normal limits. There is aortic atherosclerosis. Electronically Signed   By: Bretta Bang III M.D.   On: 10/01/2015 14:21   Dg Abd Portable 1v-small Bowel Obstruction Protocol-initial, 8 Hr Delay  Result Date: 10/02/2015 CLINICAL DATA:  Small bowel obstruction.  8 hour post contrast film. EXAM: PORTABLE ABDOMEN - 1 VIEW COMPARISON:  10/01/2015 FINDINGS: Enteric tube tip is in the right upper quadrant, likely in the distal stomach or duodenum bulb region. Gaseous distention of the stomach. Contrast material is demonstrated throughout the colon suggesting no evidence of complete small bowel obstruction. Vascular stents and coils. Inferior vena caval filter. Surgical clips in the right abdomen. IMPRESSION: Contrast material  is present in the colon suggesting no evidence of complete small bowel obstruction. Electronically Signed   By: Burman Nieves M.D.   On: 10/02/2015 04:40   Dg Abd Portable 1v-small Bowel Protocol-position Verification  Result Date: 10/01/2015 CLINICAL DATA:  NG tube placement. EXAM: PORTABLE ABDOMEN - 1 VIEW COMPARISON:  CT 10/01/2015 FINDINGS: NG tube is in place with the tip in the perihilar region, possibly distal stomach or proximal duodenum. Side port is in the distal stomach. Decreasing small bowel dilatation. Prior cholecystectomy. No free air. IMPRESSION: NG tube tip in the peripyloric region, possibly distal stomach or proximal duodenum. Decreasing small bowel dilatation. Electronically Signed   By: Charlett Nose M.D.   On: 10/01/2015 16:21    Assessment & Plan    ATRIAL FIB WITH RVR:   She was on Eliquis but this is held pending a decision about her phenytoin and drug interaction.  It  certainly would be easier to continue Eliquis if we can find an alternative to phenytoin.   For now I would continue current meds for rate control.  If rate remains elevated I might consider adding dig.    ACUTE ON CHRONIC DIASTOLIC HF:  Seems to be euvolemic.      Signed, Rollene RotundaJames Temprence Rhines, MD  10/06/2015, 9:34 AM

## 2015-10-06 NOTE — Progress Notes (Signed)
PROGRESS NOTE  Brandi Heath  ZOX:[id number] DOB: Nov 23, 1927 DOA: 10/01/2015 PCP: Brandi Seashore, MD  Brief Narrative:  Brandi Heath a 82 y.o.femalewith a past medical history of dementia, previous episodes of small bowel obstruction, atrial fibrillation on anticoagulation, history of PE and DVT, history of stroke, history of seizure disorder who was in her usual state of health at her nursing facility, [facility] this past Thursday and Friday when she started having nausea followed by vomiting.  She was admitted with small bowel obstruction secondary to adhesions from previous surgery.  She was also in Afib with RVR and started on IV diltiazem infusion.  She was converted to oral diltiazem.  On 9/20 she developed sudden onset numbness of the left extremities.  MRI was negative for stroke.    Assessment & Plan:   Principal Problem:   SBO (small bowel obstruction) (HCC) Active Problems:   Dementia with behavioral disturbance   Atrial fibrillation with rapid ventricular response (HCC)   Personal history of PE (pulmonary embolism)   History of CVA (cerebrovascular accident)   Personal history of DVT (deep vein thrombosis)   Chronic CHF (congestive heart failure) (HCC)   Acute on chronic diastolic heart failure (HCC)   Normocytic anemia   Thrombocytopenia (HCC)   TIA (transient ischemic attack)   Left sided numbness  TIA with transient left sided numbness and worsening slurred speech in setting of a-fib.  Patient was not on anticoagulation at the time that the TIA occurred and prior to that she had been on NOAC + phenytoin, which is contraindicated because of reduction of efficacy of NOAC with phenytoin use.  She was therefore chronically under-anticoagulated.  See below. -  Appreciate speech assistance -  Transition to oral medications -  MRI/MRA brain:  No evidence of acute stroke -  UA neg -  LDL 28 -  A1c 5.8 -  Carotid duplex 1-39 percent bilaterally, antegrade vertebral  artery flow  -  ECHO:  EF 40-45 percent, moderate to severe mitral valve regurgitation, thin mobile density in the right atrium that could represent Chiari, but could not exclude intraatrial connection.  No obvious thrombus within atrial septal aneurysm.   -  Resumed ant  Wheezing and SOB, likely acute on chronic diastolic heart failure secondary to a-fib with RVR, improving -  Continue lasix 40mg  IV BID -  Neither weights nor I/O recorded  Small bowel obstruction resolved with conservative measures.  She was followed by general surgery.  Her NG tube was removed on 9/19.  Tolerating regular diet.  Atrial fibrillation with RVR:  CHA2DS2 VASc score is 8.  Tachycardia to the 130s-150s but then down to 50s and 60s.  Labile heart rate. Intermittent atrial flutter on telemetry. -  Cardiology assistance appreciated -  Oral dilt -  Metoprolol 100mg  po BID -  TSH 2.030 wnl -  Plan to discharge on lovenox after discussion with her PCP.  Her PCP will admit her back to SNF and start warfarin instead which she said would not be a problem.  Did NOT want [place] to start warfarin while in the hospital > easier for them to start at SNF.    Heme positive gastric fluid: No overt hematemesis has been noted.  Hemoglobin stable near 9.8 mg/dl -  Continue PPI  Dementia: Stable. Monitor closely.  Seizure disorder on Dilantin.  -  Continue oral phenytoin for now  History of RLE DVT and PE: Not an active issue per family. She does have lower extremity  edema, which is getting better per family since she was placed on Lasix and the dose was increased recently. Continue to monitor for now.  Probable CKD stage 3, baseline creatinine 0.9 to 1.3.  Creatinine marginally improved -  avoid nephrotoxins, renally dose medications.  Hypokalemia, resolved with IV supplementation of potassium -  Check mag:  wnl -  Repeat K in AM.  Normocytic anemia, folate, B12, TSH, and iron studies wnl.  Likely due to chronic disease  and mild CKD  Mild thrombocytopenia, likely due to hemodilution from IVF and resolving   DVT Prophylaxis: lovenox Code Status:DO NOT RESUSCITATE Family Communication:  Patient alone Disposition:  Continue telemetry.  Home pending further diuresis and better heart rate stability.    Consultants:   General surgery  Cardiology  Procedures:  none  Antimicrobials:   none    Subjective: Passing flatus and still having bowel movements.  Slurred speech and left-sided numbness have resolved.  Breathing has improved.  Objective: Vitals:   10/06/15 0522 10/06/15 1010 10/06/15 1026 10/06/15 1435  BP: 111/61 117/67  107/72  Pulse: 73 82 82 (!) 118  Resp: 18 17  18   Temp: 97.9 F (36.6 C) 98.2 F (36.8 C)  98.1 F (36.7 C)  TempSrc: Oral Oral  Axillary  SpO2: 100% 98%  97%  Weight:      Height:        Intake/Output Summary (Last 24 hours) at 10/06/15 1601 Last data filed at 10/05/15 2211  Gross per 24 hour  Intake               50 ml  Output                0 ml  Net               50 ml   Filed Weights   10/02/15 0353 10/03/15 0636 10/04/15 0500  Weight: 68.1 kg (150 lb 2.1 oz) 66.8 kg (147 lb 4.3 oz) 70.4 kg (155 lb 3.3 oz)    Examination:  General exam:  Adult female, Awake, alert  No acute distress.  HEENT:  NCAT, MMM Respiratory system:  Rales at the bilateral bases, no rhonchi or wheeze Cardiovascular system:  Tachycardic, IRRR, normal S1/S2. 2/6 systolic murmur.  No gallops or clicks.  Warm extremities Gastrointestinal system: Normal active bowel sounds, soft, nondistended, nontender. MSK:  Normal tone and bulk, 2+ pitting bilateral lower extremity edema Neuro:  4/5 strength left upper and lower extremities.  Mild dysmetria left extremities.  No obvious facial droop.  Sensation intact to light touch throughout.  Drift on the left extremities.  No obvious field cuts.  No hemineglect.      Data Reviewed: I have personally reviewed following labs and imaging  studies  CBC:  Recent Labs Lab 10/01/15 1040  10/02/15 0214 10/03/15 0415 10/04/15 0405 10/05/15 0522 10/06/15 0548  WBC 7.7  < > 7.8 6.6 5.2 4.9 6.2  NEUTROABS 5.7  --   --  4.6  --   --   --   HGB 12.6  < > 11.9* 9.7* 9.2* 9.8* 10.3*  HCT 36.0  < > 35.4* 29.2* 27.3* 28.6* 30.2*  MCV 84.7  < > 87.4 88.2 87.5 85.6 85.1  PLT 175  < > 155 135* 122* 140* 176  < > = values in this interval not displayed. Basic Metabolic Panel:  Recent Labs Lab 10/02/15 0214 10/03/15 0415 10/04/15 0405 10/05/15 0522 10/06/15 0548  NA 142 143 141  140 139  K 4.6 2.9* 3.6 3.7 3.0*  CL 100* 106 106 105 101  CO2 31 32 30 29 31   GLUCOSE 82 103* 113* 111* 96  BUN 27* 22* 18 17 15   CREATININE 1.12* 0.93 0.96 1.04* 0.90  CALCIUM 7.9* 7.9* 8.0* 8.4* 8.4*  MG  --  2.1  --   --   --    GFR: Estimated Creatinine Clearance: 43.6 mL/min (by C-G formula based on SCr of 0.9 mg/dL). Liver Function Tests:  Recent Labs Lab 10/01/15 1040 10/02/15 0214 10/03/15 0415  AST 28 40 19  ALT 19 15 12*  ALKPHOS 171* 137* 111  BILITOT 0.9 0.9 0.7  PROT 7.2 6.3* 5.4*  ALBUMIN 3.7 3.1* 2.8*    Recent Labs Lab 10/01/15 1040  LIPASE 34   No results for input(s): AMMONIA in the last 168 hours. Coagulation Profile: No results for input(s): INR, PROTIME in the last 168 hours. Cardiac Enzymes: No results for input(s): CKTOTAL, CKMB, CKMBINDEX, TROPONINI in the last 168 hours. BNP (last 3 results) No results for input(s): PROBNP in the last 8760 hours. HbA1C:  Recent Labs  10/04/15 0405  HGBA1C 5.8*   CBG: No results for input(s): GLUCAP in the last 168 hours. Lipid Profile:  Recent Labs  10/04/15 0405  CHOL 74  HDL 31*  LDLCALC 28  TRIG 74  CHOLHDL 2.4   Thyroid Function Tests:  Recent Labs  10/05/15 0522  TSH 1.522   Anemia Panel:  Recent Labs  10/05/15 0522  VITAMINB12 2,099*  FOLATE 22.8  FERRITIN 64  TIBC 210*  IRON 42   Urine analysis:    Component Value Date/Time     COLORURINE YELLOW 10/01/2015 1022   APPEARANCEUR CLEAR 10/01/2015 1022   LABSPEC 1.041 (H) 10/01/2015 1022   PHURINE 7.5 10/01/2015 1022   GLUCOSEU NEGATIVE 10/01/2015 1022   HGBUR NEGATIVE 10/01/2015 1022   BILIRUBINUR SMALL (A) 10/01/2015 1022   KETONESUR NEGATIVE 10/01/2015 1022   PROTEINUR NEGATIVE 10/01/2015 1022   NITRITE NEGATIVE 10/01/2015 1022   LEUKOCYTESUR NEGATIVE 10/01/2015 1022   Sepsis Labs: @LABRCNTIP (procalcitonin:4,lacticidven:4)  ) Recent Results (from the past 240 hour(s))  MRSA PCR Screening     Status: None   Collection Time: 10/01/15  5:14 PM  Result Value Ref Range Status   MRSA by PCR NEGATIVE NEGATIVE Final    Comment:        The GeneXpert MRSA Assay (FDA approved for NASAL specimens only), is one component of a comprehensive MRSA colonization surveillance program. It is not intended to diagnose MRSA infection nor to guide or monitor treatment for MRSA infections.       Radiology Studies: No results found.   Scheduled Meds: .  stroke: mapping our early stages of recovery book   Does not apply Once  . diltiazem  360 mg Oral Daily  . donepezil  10 mg Oral QHS  . DULoxetine  60 mg Oral QHS  . enoxaparin (LOVENOX) injection  1 mg/kg Subcutaneous Q12H  . famotidine  20 mg Oral BID  . furosemide  80 mg Oral BID  . latanoprost  1 drop Both Eyes QHS  . metoprolol tartrate  100 mg Oral BID  . pantoprazole  40 mg Oral Daily  . phenytoin  100 mg Oral Daily  . phenytoin  200 mg Oral QHS  . risperiDONE  1 mg Oral BID  . sodium chloride flush  3 mL Intravenous Q12H   Continuous Infusions: . diltiazem (CARDIZEM) infusion Stopped (  10/04/15 1558)     LOS: 5 days    Time spent: 30 min    Renae Fickle, MD [facility] Pager [phone]  If 7PM-7AM, please contact night-coverage [email or web address] Password [email or web address] 10/06/2015, 4:01 PM

## 2015-10-06 NOTE — Progress Notes (Signed)
PHARMACIST - PHYSICIAN COMMUNICATION CONCERNING: IV to Oral Route Change Policy  RECOMMENDATION: This patient is receiving Pepcid by the intravenous route.  Based on criteria approved by the Pharmacy and Therapeutics Committee, the intravenous medication(s) is/are being converted to the equivalent oral dose form(s).   DESCRIPTION: These criteria include:  The patient is eating (either orally or via tube) and/or has been taking other orally administered medications for a least 24 hours  The patient has no evidence of active gastrointestinal bleeding or impaired GI absorption (gastrectomy, short bowel, patient on TNA or NPO).  If you have questions about this conversion, please contact the Pharmacy Department  []   [phone] )  [facility] []   [phone] )  [facility] []   [phone] )  [facility] []   [phone] )  [facility] [x]   [phone] )  [facility]   Clance Boll, [facility] 10/06/2015 8:44 AM

## 2015-10-06 NOTE — Progress Notes (Signed)
Echocardiogram 2D Echocardiogram has been performed.  Eila Runyan N Genowefa Morga 10/06/2015, 9:02 AM 

## 2015-10-07 ENCOUNTER — Inpatient Hospital Stay (HOSPITAL_COMMUNITY): Payer: Medicare Other

## 2015-10-07 DIAGNOSIS — R4781 Slurred speech: Secondary | ICD-10-CM

## 2015-10-07 LAB — BASIC METABOLIC PANEL
Anion gap: 7 (ref 5–15)
BUN: 16 mg/dL (ref 4–21)
BUN: 16 mg/dL (ref 6–20)
CHLORIDE: 102 mmol/L (ref 101–111)
CO2: 30 mmol/L (ref 22–32)
CREATININE: 1.08 mg/dL — AB (ref 0.44–1.00)
Calcium: 8.4 mg/dL — ABNORMAL LOW (ref 8.9–10.3)
Creatinine: 1.1 mg/dL (ref 0.5–1.1)
GFR calc Af Amer: 52 mL/min — ABNORMAL LOW (ref >60)
GFR calc non Af Amer: 45 mL/min — ABNORMAL LOW (ref >60)
GLUCOSE: 116 mg/dL — AB (ref 65–99)
Glucose: 116 mg/dL
Potassium: 3.8 mmol/L (ref 3.4–5.3)
Potassium: 3.8 mmol/L (ref 3.5–5.1)
SODIUM: 139 mmol/L (ref 135–145)
Sodium: 139 mmol/L (ref 137–147)

## 2015-10-07 LAB — CBC
HCT: 29.1 % — ABNORMAL LOW (ref 36.0–46.0)
Hemoglobin: 9.9 g/dL — ABNORMAL LOW (ref 12.0–15.0)
MCH: 28.7 pg (ref 26.0–34.0)
MCHC: 34 g/dL (ref 30.0–36.0)
MCV: 84.3 fL (ref 78.0–100.0)
PLATELETS: 200 10*3/uL (ref 150–400)
RBC: 3.45 MIL/uL — ABNORMAL LOW (ref 3.87–5.11)
RDW: 15.1 % (ref 11.5–15.5)
WBC: 4.5 10*3/uL (ref 4.0–10.5)

## 2015-10-07 LAB — CBC AND DIFFERENTIAL
HEMATOCRIT: 29 % — AB (ref 36–46)
Hemoglobin: 9.9 g/dL — AB (ref 12.0–16.0)
PLATELETS: 200 10*3/uL (ref 150–399)
WBC: 4.5 10*3/mL

## 2015-10-07 MED ORDER — FUROSEMIDE 10 MG/ML IJ SOLN
80.0000 mg | Freq: Two times a day (BID) | INTRAMUSCULAR | Status: DC
  Administered 2015-10-07 – 2015-10-08 (×3): 80 mg via INTRAVENOUS
  Filled 2015-10-07 (×3): qty 8

## 2015-10-07 MED ORDER — DM-GUAIFENESIN ER 30-600 MG PO TB12
1.0000 | ORAL_TABLET | Freq: Two times a day (BID) | ORAL | Status: DC
  Administered 2015-10-07 – 2015-10-08 (×3): 1 via ORAL
  Filled 2015-10-07 (×3): qty 1

## 2015-10-07 NOTE — Progress Notes (Signed)
Patient Name: Brandi Heath Date of Encounter: 10/07/2015  Hospital Problem List     Principal Problem:   SBO (small bowel obstruction) (HCC) Active Problems:   Dementia with behavioral disturbance   Atrial fibrillation with rapid ventricular response (HCC)   Personal history of PE (pulmonary embolism)   History of CVA (cerebrovascular accident)   Personal history of DVT (deep vein thrombosis)   Chronic CHF (congestive heart failure) (HCC)   Acute on chronic diastolic heart failure (HCC)   Normocytic anemia   Thrombocytopenia (HCC)   TIA (transient ischemic attack)   Left sided numbness    Patient Profile     82 y/o female nursing home resident, with h/o dementia, previous h/o SBO, chronic atrial fibrillation on chronic anticoagulation, h/o PE/DVT, stroke and seizure disorder, admitted for severe n/v, found to have recurrent SBO by CT scan. Cardiology consulted for atrial fibrillation w/ RVR.  Also felt to have a/c diastolic CHF and possible TIA (Suddenleft sided numbness and worsening slurred speech - symptoms resolved).     Subjective   She seems to be less confused today.  She denies any acute symptoms.  No SOB.  No pain.    Inpatient Medications    .  stroke: mapping our early stages of recovery book   Does not apply Once  . diltiazem  360 mg Oral Daily  . donepezil  10 mg Oral QHS  . DULoxetine  60 mg Oral QHS  . enoxaparin (LOVENOX) injection  1 mg/kg Subcutaneous Q12H  . famotidine  20 mg Oral BID  . furosemide  80 mg Oral BID  . latanoprost  1 drop Both Eyes QHS  . metoprolol tartrate  100 mg Oral BID  . pantoprazole  40 mg Oral Daily  . phenytoin  100 mg Oral Daily  . phenytoin  200 mg Oral QHS  . risperiDONE  1 mg Oral BID  . sodium chloride flush  3 mL Intravenous Q12H    Vital Signs    Vitals:   10/06/15 1026 10/06/15 1435 10/06/15 2016 10/07/15 0549  BP:  107/72 92/64 97/71   Pulse: 82 (!) 118 (!) 103 (!) 52  Resp:  18 18 18   Temp:  98.1 F (36.7  C) 98.9 F (37.2 C) 98.4 F (36.9 C)  TempSrc:  Axillary Oral Oral  SpO2:  97% 99% 100%  Weight:      Height:        Intake/Output Summary (Last 24 hours) at 10/07/15 0826 Last data filed at 10/06/15 1816  Gross per 24 hour  Intake              440 ml  Output                0 ml  Net              440 ml   Filed Weights   10/02/15 0353 10/03/15 0636 10/04/15 0500  Weight: 150 lb 2.1 oz (68.1 kg) 147 lb 4.3 oz (66.8 kg) 155 lb 3.3 oz (70.4 kg)    Physical Exam    GEN: Well nourished, well developed, in no no acute distress.  Neck: Supple, no JVD, carotid bruits, or masses. Cardiac: Irregular, no rubs, or gallops. No clubbing, cyanosis, mild edema.  Radials/DP/PT 2+ and equal bilaterally.  Respiratory:  Respirations  regular and unlabored, clear to auscultation bilaterally. GI: Soft, nontender, nondistended, BS + x 4. Neuro:  Strength and sensation are intact.   Labs  CBC  Recent Labs  10/06/15 0548 10/07/15 0549  WBC 6.2 4.5  HGB 10.3* 9.9*  HCT 30.2* 29.1*  MCV 85.1 84.3  PLT 176 200   Basic Metabolic Panel  Recent Labs  10/06/15 0548 10/07/15 0549  NA 139 139  K 3.0* 3.8  CL 101 102  CO2 31 30  GLUCOSE 96 116*  BUN 15 16  CREATININE 0.90 1.08*  CALCIUM 8.4* 8.4*   Liver Function Tests No results for input(s): AST, ALT, ALKPHOS, BILITOT, PROT, ALBUMIN in the last 72 hours. No results for input(s): LIPASE, AMYLASE in the last 72 hours. Cardiac Enzymes No results for input(s): CKTOTAL, CKMB, CKMBINDEX, TROPONINI in the last 72 hours. BNP Invalid input(s): POCBNP D-Dimer No results for input(s): DDIMER in the last 72 hours. Hemoglobin A1C No results for input(s): HGBA1C in the last 72 hours. Fasting Lipid Panel No results for input(s): CHOL, HDL, LDLCALC, TRIG, CHOLHDL, LDLDIRECT in the last 72 hours. Thyroid Function Tests  Recent Labs  10/05/15 0522  TSH 1.522    Telemetry    Atrial fib:  Rate is for the most part controlled.  It  does go up with stimulation.  Overall rate is about 100.   ECG    NA  Radiology    Mr Shirlee Latch PZ Contrast  Result Date: 10/03/2015 CLINICAL DATA:  Code stroke. Acute mental status changes beginning 90 minutes ago. Lethargy. Left-sided numbness. EXAM: MRI HEAD WITHOUT CONTRAST MRA HEAD WITHOUT CONTRAST TECHNIQUE: Multiplanar, multiecho pulse sequences of the brain and surrounding structures were obtained without intravenous contrast. Angiographic images of the head were obtained using MRA technique without contrast. COMPARISON:  None. FINDINGS: MRI HEAD FINDINGS Brain: Diffusion imaging does not show any acute or subacute infarction. The brainstem is normal. There is cerebellar atrophy. Cerebral hemispheres show generalized age related atrophy with chronic small-vessel ischemic change of the deep white matter. No large vessel territory infarction. No mass lesion, hemorrhage, hydrocephalus or extra-axial collection. No pituitary mass. Vascular: Major vessels are patent at the base of the brain. Skull and upper cervical spine: Negative Sinuses/Orbits: Small mastoid effusions. Other: None significant MRA HEAD FINDINGS Both internal carotid arteries are widely patent through the skullbase. On the left, the anterior and middle cerebral vessels are patent without proximal stenosis, aneurysm or vascular malformation. More distal branch vessels show atherosclerotic irregularity. On the right, the anterior cerebral branches are patent. There is what I believe is artifactual signal loss within the right middle cerebral artery territory related to downward tortuosity. In evaluating the source images, that vessel appears to be widely patent. Both vertebral arteries are widely patent to the basilar. No basilar stenosis. Posterior circulation branch vessels are normal. IMPRESSION: No acute infarction. Generalized age related atrophy and chronic small vessel change of the cerebral hemispheric deep white matter.  Intracranial MR angiography shows patency of the large and medium size vessels. There is artifactual signal loss within the right middle cerebral artery related to downward tortuosity. Evaluation of the source images shows that vessel to be patent. The patient does have some distal vessel atherosclerotic irregularity. Electronically Signed   By: Paulina Fusi M.D.   On: 10/03/2015 12:15   Mr Brain Wo Contrast  Result Date: 10/03/2015 CLINICAL DATA:  Code stroke. Acute mental status changes beginning 90 minutes ago. Lethargy. Left-sided numbness. EXAM: MRI HEAD WITHOUT CONTRAST MRA HEAD WITHOUT CONTRAST TECHNIQUE: Multiplanar, multiecho pulse sequences of the brain and surrounding structures were obtained without intravenous contrast. Angiographic images of the head  were obtained using MRA technique without contrast. COMPARISON:  None. FINDINGS: MRI HEAD FINDINGS Brain: Diffusion imaging does not show any acute or subacute infarction. The brainstem is normal. There is cerebellar atrophy. Cerebral hemispheres show generalized age related atrophy with chronic small-vessel ischemic change of the deep white matter. No large vessel territory infarction. No mass lesion, hemorrhage, hydrocephalus or extra-axial collection. No pituitary mass. Vascular: Major vessels are patent at the base of the brain. Skull and upper cervical spine: Negative Sinuses/Orbits: Small mastoid effusions. Other: None significant MRA HEAD FINDINGS Both internal carotid arteries are widely patent through the skullbase. On the left, the anterior and middle cerebral vessels are patent without proximal stenosis, aneurysm or vascular malformation. More distal branch vessels show atherosclerotic irregularity. On the right, the anterior cerebral branches are patent. There is what I believe is artifactual signal loss within the right middle cerebral artery territory related to downward tortuosity. In evaluating the source images, that vessel appears to  be widely patent. Both vertebral arteries are widely patent to the basilar. No basilar stenosis. Posterior circulation branch vessels are normal. IMPRESSION: No acute infarction. Generalized age related atrophy and chronic small vessel change of the cerebral hemispheric deep white matter. Intracranial MR angiography shows patency of the large and medium size vessels. There is artifactual signal loss within the right middle cerebral artery related to downward tortuosity. Evaluation of the source images shows that vessel to be patent. The patient does have some distal vessel atherosclerotic irregularity. Electronically Signed   By: Paulina Fusi M.D.   On: 10/03/2015 12:15   Ct Abdomen Pelvis W Contrast  Result Date: 10/01/2015 CLINICAL DATA:  Nursing home patient with dementia, presenting with fecal emesis. Evaluate for bowel obstruction. EXAM: CT ABDOMEN AND PELVIS WITH CONTRAST TECHNIQUE: Multidetector CT imaging of the abdomen and pelvis was performed using the standard protocol following bolus administration of intravenous contrast. CONTRAST:  75mL ISOVUE-300 IOPAMIDOL (ISOVUE-300) INJECTION 61% COMPARISON:  None. FINDINGS: Lower chest: The heart is enlarged. There is atherosclerosis of the coronary arteries. No significant pleural or pericardial effusion. No confluent basilar airspace disease. Hepatobiliary: No focal hepatic abnormalities are identified. There is no significant biliary dilatation status post cholecystectomy. Pancreas: Diffusely atrophied. No evidence of pancreatic mass, ductal dilatation or surrounding inflammation. Spleen: Normal in size without focal abnormality. Adrenals/Urinary Tract: Both adrenal glands appear normal. Both kidneys demonstrate cortical thinning. There are cysts in the lower pole of the right kidney, measuring up to 3.2 cm in diameter. No evidence of renal mass, urinary tract calculus or hydronephrosis. The bladder appears unremarkable for its degree of distention.  Stomach/Bowel: Small hiatal hernia. The stomach, proximal and mid small bowel are mildly dilated with multiple air-fluid levels. The distal small bowel is decompressed. The transition point is suspected to be in the false pelvis (axial image 56). No surrounding soft tissue mass or fluid collection is apparent in this area. The colon is decompressed. No apparent fistula or focal extraluminal fluid collection. There is a small amount of free pelvic fluid. Vascular/Lymphatic: There are no enlarged abdominal or pelvic lymph nodes. Patient has an aorto bi-iliac stent graft which appears patent. 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 No evidence of endograft leak. Both internal iliac arteries appear thrombosed status post probable embolization on the right. The external iliac arteries are patent. IVC filter noted with mild strut penetration. Reproductive: Hysterectomy.  No evidence of adnexal mass. Other: There is a small amount  of ascites. There is generalized edema throughout the mesenteric and subcutaneous fat. No focal extraluminal fluid collections are seen. Musculoskeletal: No acute or significant osseous findings. The bones are demineralized. There is a possible sacral decubitus ulcer. No evidence of bone destruction. Lower lumbar spondylosis noted. IMPRESSION: 1. Findings are consistent with a mid to distal small bowel obstruction. Transition point appears to be in the false pelvis without discernible cause, suggesting adhesions. 2. No fistula or abscess identified. 3. Generalized soft tissue edema with mild pelvic ascites. 4. Patent aortic stent graft. Electronically Signed   By: Carey Bullocks M.D.   On: 10/01/2015 13:50   Dg Chest Port 1 View  Result Date: 10/03/2015 CLINICAL DATA:  Abnormal breath sounds. EXAM: PORTABLE CHEST 1 VIEW COMPARISON:  10/01/2015. FINDINGS: Cardiomegaly with pulmonary vascular prominence. Mild right perihilar interstitial  prominence noted. Mild interstitial edema and/or pneumonitis cannot be excluded. Left pleural effusion. No pneumothorax IMPRESSION: 1.  Interim removal of NG tube. 2. Cardiomegaly with mild pulmonary venous congestion. Mild right perihilar interstitial prominence. Mild perihilar pulmonary edema and/or pneumonitis cannot be excluded. Electronically Signed   By: Maisie Fus  Register   On: 10/03/2015 11:21   Dg Chest Portable 1 View  Result Date: 10/01/2015 CLINICAL DATA:  Nasogastric tube placement. Decreased oxygen saturation. EXAM: PORTABLE CHEST 1 VIEW COMPARISON:  None. FINDINGS: Nasogastric tube tip and side port are below the diaphragm. No pneumothorax. There is fine nodular interstitial disease throughout the lungs with a mid to lower lung lung zone predominance. There is no airspace consolidation or volume loss. The heart size and pulmonary vascular normal. No adenopathy. There is atherosclerotic calcification in the aorta. There is degenerative change in each shoulder. IMPRESSION: Nasogastric tube tip and side port below the diaphragm. Fine nodular interstitial disease throughout the lungs of uncertain etiology or chronicity. No frank edema or consolidation. Cardiac silhouette within normal limits. There is aortic atherosclerosis. Electronically Signed   By: Bretta Bang III M.D.   On: 10/01/2015 14:21   Dg Abd Portable 1v-small Bowel Obstruction Protocol-initial, 8 Hr Delay  Result Date: 10/02/2015 CLINICAL DATA:  Small bowel obstruction.  8 hour post contrast film. EXAM: PORTABLE ABDOMEN - 1 VIEW COMPARISON:  10/01/2015 FINDINGS: Enteric tube tip is in the right upper quadrant, likely in the distal stomach or duodenum bulb region. Gaseous distention of the stomach. Contrast material is demonstrated throughout the colon suggesting no evidence of complete small bowel obstruction. Vascular stents and coils. Inferior vena caval filter. Surgical clips in the right abdomen. IMPRESSION: Contrast material  is present in the colon suggesting no evidence of complete small bowel obstruction. Electronically Signed   By: Burman Nieves M.D.   On: 10/02/2015 04:40   Dg Abd Portable 1v-small Bowel Protocol-position Verification  Result Date: 10/01/2015 CLINICAL DATA:  NG tube placement. EXAM: PORTABLE ABDOMEN - 1 VIEW COMPARISON:  CT 10/01/2015 FINDINGS: NG tube is in place with the tip in the perihilar region, possibly distal stomach or proximal duodenum. Side port is in the distal stomach. Decreasing small bowel dilatation. Prior cholecystectomy. No free air. IMPRESSION: NG tube tip in the peripyloric region, possibly distal stomach or proximal duodenum. Decreasing small bowel dilatation. Electronically Signed   By: Charlett Nose M.D.   On: 10/01/2015 16:21    Assessment & Plan    ATRIAL FIB WITH RVR:   She will be on Lovenox and then warfarin at her nursing home.   Rate is average about 100.  At this point I will not add  dig as it does have a moderate interaction with phenytoin.  She can have an outpatient Holter in the future to further judge her rate control.     ACUTE ON CHRONIC DIASTOLIC HF:  Seems to be euvolemic.   Continue current therapy.      Signed, Rollene RotundaJames Ventura Leggitt, MD  10/07/2015, 8:26 AM

## 2015-10-07 NOTE — Progress Notes (Signed)
PROGRESS NOTE  Brandi Heath  ZOX:[id number] DOB: 1927/11/22 DOA: 10/01/2015 PCP: Merrilee SeashoreAnne Alexander, MD  Brief Narrative:  Brandi NovemberRuth Wilsonis a 82 y.o.femalewith a past medical history of dementia, previous episodes of small bowel obstruction, atrial fibrillation on anticoagulation, history of PE and DVT, history of stroke, history of seizure disorder who was in her usual state of health at her nursing facility, [facility],till this past Thursday and Friday when she started having nausea followed by vomiting.  She was admitted with small bowel obstruction secondary to adhesions from previous surgery.  She was also in Afib with RVR and started on IV diltiazem infusion.  She was converted to oral diltiazem.  On 9/20 she developed sudden onset numbness of the left extremities.  MRI was negative for stroke.    Assessment & Plan:   Principal Problem:   SBO (small bowel obstruction) (HCC) Active Problems:   Dementia with behavioral disturbance   Atrial fibrillation with rapid ventricular response (HCC)   Personal history of PE (pulmonary embolism)   History of CVA (cerebrovascular accident)   Personal history of DVT (deep vein thrombosis)   Chronic CHF (congestive heart failure) (HCC)   Acute on chronic diastolic heart failure (HCC)   Normocytic anemia   Thrombocytopenia (HCC)   TIA (transient ischemic attack)   Left sided numbness  TIA with transient left sided numbness and worsening slurred speech in setting of a-fib.  Patient was not on anticoagulation at the time that the TIA occurred and prior to that she had been on NOAC + phenytoin, which is contraindicated because of reduction of efficacy of NOAC with phenytoin use.  She was therefore chronically under-anticoagulated.  See below. -  Appreciate speech assistance -  Transition to oral medications -  MRI/MRA brain:  No evidence of acute stroke -  UA neg -  LDL 28 -  A1c 5.8 -  Carotid duplex 1-39 percent bilaterally, antegrade vertebral  artery flow  -  ECHO:  EF 40-45 percent, moderate to severe mitral valve regurgitation, thin mobile density in the right atrium that could represent Chiari, but could not exclude intraatrial connection.  No obvious thrombus within atrial septal aneurysm.   -  Resumed anticoagulation as below  Wheezing and SOB, likely acute on chronic diastolic heart failure secondary to a-fib with RVR.  Persistent productive cough of brown phlegm.  Afebrile, no leukocytosis.  -  CXR:  Persistent interstitial edema -  Change to IV lasix -  Add guaifenesin -  Neither weights nor I/O recorded  Small bowel obstruction resolved with conservative measures.  She was followed by general surgery.  Her NG tube was removed on 9/19.  Tolerating regular diet.  Atrial fibrillation with RVR:  CHA2DS2 VASc score is 8.  Tachycardia to the 130s-150s but then down to 50s and 60s.  Labile heart rate.  HR trending down and mostly controlled over the last 24 hours -  Cardiology assistance appreciated -  Oral dilt -  Metoprolol 100mg  po BID -  TSH 2.030 wnl -  Plan to discharge on lovenox after discussion with her PCP.  Her PCP will admit her back to SNF and start warfarin instead which she said would not be a problem.  Did NOT want us to start warfarin while in the hospital > easier for them to start at SNF.   -  Plan for possible Holter as outpatient  Heme positive gastric fluid: No overt hematemesis has been noted.  Hemoglobin stable near 9.8 mg/dl -  Continue PPI  Dementia: Stable. Monitor closely.  Seizure disorder on Dilantin.  -  Continue oral phenytoin for now  History of RLE DVT and PE: Not an active issue per family. She does have lower extremity edema, which is getting better per family since she was placed on Lasix and the dose was increased recently. Continue to monitor for now.  Probable CKD stage 3, baseline creatinine 0.9 to 1.3.  Creatinine marginally improved -  avoid nephrotoxins, renally dose  medications.  Hypokalemia, resolved with IV supplementation of potassium -  Check mag:  wnl -  Repeat K in AM.  Normocytic anemia, folate, B12, TSH, and iron studies wnl.  Likely due to chronic disease and mild CKD  Mild thrombocytopenia, likely due to hemodilution from IVF and resolving   DVT Prophylaxis: lovenox Code Status:DO NOT RESUSCITATE Family Communication:  Patient alone Disposition:  Continue telemetry.  Home pending further diuresis, back to SNF on Monday.  Consultants:   General surgery  Cardiology  Procedures:  none  Antimicrobials:   none    Subjective: Passing flatus and still having bowel movements.  Slurred speech and left-sided numbness have resolved.   Still having severe productive cough  Objective: Vitals:   10/06/15 1026 10/06/15 1435 10/06/15 2016 10/07/15 0549  BP:  107/72 92/64 97/71   Pulse: 82 (!) 118 (!) 103 (!) 52  Resp:  18 18 18   Temp:  98.1 F (36.7 C) 98.9 F (37.2 C) 98.4 F (36.9 C)  TempSrc:  Axillary Oral Oral  SpO2:  97% 99% 100%  Weight:      Height:        Intake/Output Summary (Last 24 hours) at 10/07/15 1250 Last data filed at 10/07/15 0824  Gross per 24 hour  Intake              680 ml  Output                0 ml  Net              680 ml   Filed Weights   10/02/15 0353 10/03/15 0636 10/04/15 0500  Weight: 68.1 kg (150 lb 2.1 oz) 66.8 kg (147 lb 4.3 oz) 70.4 kg (155 lb 3.3 oz)    Examination:  General exam:  Adult female, Awake, alert  No acute distress.  HEENT:  NCAT, MMM Respiratory system:  Rales at the bilateral bases, no rhonchi or wheeze Cardiovascular system:  RRR, normal S1/S2. 2/6 systolic murmur.  No gallops or clicks.  Warm extremities Gastrointestinal system: Normal active bowel sounds, soft, nondistended, nontender. MSK:  Normal tone and bulk, 2+ pitting bilateral lower extremity edema Neuro:  4/5 strength left upper and lower extremities.  Mild dysmetria left extremities.  No obvious facial  droop.  Sensation intact to light touch throughout.  Drift on the left extremities.  No obvious field cuts.  No hemineglect.      Data Reviewed: I have personally reviewed following labs and imaging studies  CBC:  Recent Labs Lab 10/01/15 1040  10/03/15 0415 10/04/15 0405 10/05/15 0522 10/06/15 0548 10/07/15 0549  WBC 7.7  < > 6.6 5.2 4.9 6.2 4.5  NEUTROABS 5.7  --  4.6  --   --   --   --   HGB 12.6  < > 9.7* 9.2* 9.8* 10.3* 9.9*  HCT 36.0  < > 29.2* 27.3* 28.6* 30.2* 29.1*  MCV 84.7  < > 88.2 87.5 85.6 85.1 84.3  PLT 175  < >  135* 122* 140* 176 200  < > = values in this interval not displayed. Basic Metabolic Panel:  Recent Labs Lab 10/03/15 0415 10/04/15 0405 10/05/15 0522 10/06/15 0548 10/07/15 0549  NA 143 141 140 139 139  K 2.9* 3.6 3.7 3.0* 3.8  CL 106 106 105 101 102  CO2 32 30 29 31 30   GLUCOSE 103* 113* 111* 96 116*  BUN 22* 18 17 15 16   CREATININE 0.93 0.96 1.04* 0.90 1.08*  CALCIUM 7.9* 8.0* 8.4* 8.4* 8.4*  MG 2.1  --   --   --   --    GFR: Estimated Creatinine Clearance: 36.4 mL/min (by C-G formula based on SCr of 1.08 mg/dL (H)). Liver Function Tests:  Recent Labs Lab 10/01/15 1040 10/02/15 0214 10/03/15 0415  AST 28 40 19  ALT 19 15 12*  ALKPHOS 171* 137* 111  BILITOT 0.9 0.9 0.7  PROT 7.2 6.3* 5.4*  ALBUMIN 3.7 3.1* 2.8*    Recent Labs Lab 10/01/15 1040  LIPASE 34   No results for input(s): AMMONIA in the last 168 hours. Coagulation Profile: No results for input(s): INR, PROTIME in the last 168 hours. Cardiac Enzymes: No results for input(s): CKTOTAL, CKMB, CKMBINDEX, TROPONINI in the last 168 hours. BNP (last 3 results) No results for input(s): PROBNP in the last 8760 hours. HbA1C: No results for input(s): HGBA1C in the last 72 hours. CBG: No results for input(s): GLUCAP in the last 168 hours. Lipid Profile: No results for input(s): CHOL, HDL, LDLCALC, TRIG, CHOLHDL, LDLDIRECT in the last 72 hours. Thyroid Function  Tests:  Recent Labs  10/05/15 0522  TSH 1.522   Anemia Panel:  Recent Labs  10/05/15 0522  VITAMINB12 2,099*  FOLATE 22.8  FERRITIN 64  TIBC 210*  IRON 42   Urine analysis:    Component Value Date/Time   COLORURINE YELLOW 10/01/2015 1022   APPEARANCEUR CLEAR 10/01/2015 1022   LABSPEC 1.041 (H) 10/01/2015 1022   PHURINE 7.5 10/01/2015 1022   GLUCOSEU NEGATIVE 10/01/2015 1022   HGBUR NEGATIVE 10/01/2015 1022   BILIRUBINUR SMALL (A) 10/01/2015 1022   KETONESUR NEGATIVE 10/01/2015 1022   PROTEINUR NEGATIVE 10/01/2015 1022   NITRITE NEGATIVE 10/01/2015 1022   LEUKOCYTESUR NEGATIVE 10/01/2015 1022   Sepsis Labs: @LABRCNTIP (procalcitonin:4,lacticidven:4)  ) Recent Results (from the past 240 hour(s))  MRSA PCR Screening     Status: None   Collection Time: 10/01/15  5:14 PM  Result Value Ref Range Status   MRSA by PCR NEGATIVE NEGATIVE Final    Comment:        The GeneXpert MRSA Assay (FDA approved for NASAL specimens only), is one component of a comprehensive MRSA colonization surveillance program. It is not intended to diagnose MRSA infection nor to guide or monitor treatment for MRSA infections.       Radiology Studies: Dg Chest Port 1 View  Result Date: 10/07/2015 CLINICAL DATA:  Cough. H/o A-fib, CHF, CAD, HTN, PE, and Stroke. Nonsmoker. EXAM: PORTABLE CHEST 1 VIEW COMPARISON:  10/03/2015 FINDINGS: Midline trachea. Cardiomegaly accentuated by AP portable technique. Right hemidiaphragm elevation. No pleural effusion or pneumothorax. Similar mild pulmonary interstitial prominence. Lower lobe predominant reticular nodular opacities are favored to be calcified. No well-defined lobar consolidation. IMPRESSION: Cardiomegaly with similar nonspecific pulmonary interstitial thickening. Cannot exclude pulmonary venous congestion. Concurrent lower lobe predominant reticular nodular opacity may represent tiny underlying calcified pulmonary nodules. Likely the sequelae of  remote infection or inflammation. No typical findings of acute pneumonia. Electronically Signed   By:  Jeronimo Greaves M.D.   On: 10/07/2015 11:40     Scheduled Meds: .  stroke: mapping our early stages of recovery book   Does not apply Once  . dextromethorphan-guaiFENesin  1 tablet Oral BID  . diltiazem  360 mg Oral Daily  . donepezil  10 mg Oral QHS  . DULoxetine  60 mg Oral QHS  . enoxaparin (LOVENOX) injection  1 mg/kg Subcutaneous Q12H  . famotidine  20 mg Oral BID  . furosemide  80 mg Intravenous BID  . latanoprost  1 drop Both Eyes QHS  . metoprolol tartrate  100 mg Oral BID  . pantoprazole  40 mg Oral Daily  . phenytoin  100 mg Oral Daily  . phenytoin  200 mg Oral QHS  . risperiDONE  1 mg Oral BID  . sodium chloride flush  3 mL Intravenous Q12H   Continuous Infusions:     LOS: 6 days    Time spent: 30 min    Renae Fickle, MD [facility] Pager [phone]  If 7PM-7AM, please contact night-coverage [email or web address] Password [facility] 10/07/2015, 12:50 PM

## 2015-10-08 DIAGNOSIS — D696 Thrombocytopenia, unspecified: Secondary | ICD-10-CM

## 2015-10-08 LAB — BASIC METABOLIC PANEL
ANION GAP: 7 (ref 5–15)
BUN: 14 mg/dL (ref 4–21)
BUN: 14 mg/dL (ref 6–20)
CHLORIDE: 98 mmol/L — AB (ref 101–111)
CO2: 32 mmol/L (ref 22–32)
Calcium: 8.5 mg/dL — ABNORMAL LOW (ref 8.9–10.3)
Creatinine, Ser: 1.05 mg/dL — ABNORMAL HIGH (ref 0.44–1.00)
Creatinine: 1.1 mg/dL (ref 0.5–1.1)
GFR calc Af Amer: 54 mL/min — ABNORMAL LOW (ref >60)
GFR calc non Af Amer: 47 mL/min — ABNORMAL LOW (ref >60)
GLUCOSE: 103 mg/dL
GLUCOSE: 103 mg/dL — AB (ref 65–99)
POTASSIUM: 3.2 mmol/L — AB (ref 3.5–5.1)
SODIUM: 137 mmol/L (ref 137–147)
SODIUM: 137 mmol/L (ref 135–145)

## 2015-10-08 MED ORDER — POTASSIUM CHLORIDE CRYS ER 20 MEQ PO TBCR
40.0000 meq | EXTENDED_RELEASE_TABLET | Freq: Once | ORAL | Status: AC
  Administered 2015-10-08: 40 meq via ORAL
  Filled 2015-10-08: qty 2

## 2015-10-08 MED ORDER — FUROSEMIDE 40 MG PO TABS: 80.0000 mg | ORAL_TABLET | Freq: Every day | ORAL | Status: DC

## 2015-10-08 MED ORDER — POTASSIUM CHLORIDE CRYS ER 20 MEQ PO TBCR: 20.0000 meq | EXTENDED_RELEASE_TABLET | Freq: Every day | ORAL | 0 refills | Status: DC

## 2015-10-08 MED ORDER — METOPROLOL TARTRATE 100 MG PO TABS: 100.0000 mg | ORAL_TABLET | Freq: Two times a day (BID) | ORAL | 0 refills | Status: DC

## 2015-10-08 MED ORDER — ENOXAPARIN SODIUM 100 MG/ML ~~LOC~~ SOLN: 100.0000 mg | Freq: Every day | SUBCUTANEOUS | Status: DC

## 2015-10-08 NOTE — Clinical Social Work Placement (Signed)
   CLINICAL SOCIAL WORK PLACEMENT  NOTE  Date:  10/08/2015  Patient Details  Name: Brandi Heath MRN: [id number] Date of Birth: 08/11/1927  Clinical Social Work is seeking post-discharge placement for this patient at the Skilled  Nursing Facility level of care (*CSW will initial, date and re-position this form in  chart as items are completed):  No   Patient/family provided with [facility] Clinical Social Work Department's list of facilities offering this level of care within the geographic area requested by the patient (or if unable, by the patient's family).  No   Patient/family informed of their freedom to choose among providers that offer the needed level of care, that participate in Medicare, Medicaid or managed care program needed by the patient, have an available bed and are willing to accept the patient.  No   Patient/family informed of [facility]'s ownership interest in [facility] and [facility], as well as of the fact that they are under no obligation to receive care at these facilities.  PASRR submitted to EDS on       PASRR number received on       Existing PASRR number confirmed on 10/08/15     FL2 transmitted to all facilities in geographic area requested by pt/family on       FL2 transmitted to all facilities within larger geographic area on       Patient informed that his/her managed care company has contracts with or will negotiate with certain facilities, including the following:  [facility]     No   Patient/family informed of bed offers received.  Patient chooses bed at [facility]     Physician recommends and patient chooses bed at      Patient to be transferred to [facility] on 10/08/15.  Patient to be transferred to facility by [facility]     Patient family notified on 10/08/15 of transfer.  Name of family member notified:  Daughter: Tammy     PHYSICIAN Please sign DNR, Please prepare prescriptions      Additional Comment:  Patient will be returning to Facility   _______________________________________________ Clearance Coots, LCSW 10/08/2015, 1:21 PM

## 2015-10-08 NOTE — Progress Notes (Signed)
Date: October 08, 2015 Discharge orders checked for needs. No needs present at time of discharge. Marten Iles, RN, BSN, CCM   [phone] 

## 2015-10-08 NOTE — Progress Notes (Signed)
ANTICOAGULATION CONSULT NOTE  Pharmacy Consult for Lovenox Indication: atrial fibrillation  Allergies  Allergen Reactions  . Chocolate Diarrhea  . Lactose Intolerance (Gi)   . Penicillins     From [facility]   Patient Measurements: Height: 5\' 7"  (170.2 cm) Weight: 155 lb 3.3 oz (70.4 kg) IBW/kg (Calculated) : 61.6  Vital Signs: Temp: 97.5 F (36.4 C) (09/25 0550) Temp Source: Oral (09/25 0550) BP: 107/68 (09/25 0550) Pulse Rate: 72 (09/25 0550)  Labs:  Recent Labs  10/06/15 0548 10/07/15 0549 10/08/15 0510  HGB 10.3* 9.9*  --   HCT 30.2* 29.1*  --   PLT 176 200  --   CREATININE 0.90 1.08* 1.05*   Estimated Creatinine Clearance: 37.4 mL/min (by C-G formula based on SCr of 1.05 mg/dL (H)).  Assessment: 82 yo F on chronic Eliquis 2.5mg  po BID for Afib.  She also has a remote hx of VTE.  Eliquis was held on admission due to heme positive gastric fluid.  There was a concern for acute CVA while off Eliquis, however head CT was negative for an acute event.   Hg continues to trend down, however no evidence of active bleeding.  Pharmacy asked to resume Eliquis today.   Of note, patient is on both phenytoin & Eliquis PTA.  The use of DOACs is not recommended in combination with phenytoin due to Cyp 3A4/PG interactions which can cause sub-therapeutic anticoagulation effects.    Today, 10/08/2015:  CBC: Hgb, plt both low but improved from yesterday  SCr: stable wnl  Previous anticoagulation: Eliquis, last dose 9/22   Goal of Therapy: Anti-Xa level 0.6-1 units/ml 4hrs after LMWH dose given (if needed)  Plan:  Lovenox 70 mg SQ q12 hr  Monitor for signs of bleeding or worsening thrombosis  Plan discharge to SNF on Lovenox, transition to Warfarin at [facility]  Otho Bellows PharmD Pager [phone] 10/08/2015, 10:01 AM

## 2015-10-08 NOTE — Progress Notes (Addendum)
LCSWA faxed updated clinicals to SNF- [facility]  Discussed patient disposition, will continue to update daughter-Tammy.

## 2015-10-08 NOTE — Discharge Summary (Signed)
Physician Discharge Summary  Salimata Christenson ZOX:[id number] DOB: 31-Aug-1927 DOA: 10/01/2015  PCP: Merrilee Seashore, MD  Admit date: 10/01/2015 Discharge date: 10/08/2015  Admitted From: SNF  Disposition:  SNF  Recommendations for Outpatient Follow-up:  1. Please obtain BMP/CBC in one week 2. PT/OT assessments please due to deconditioning post-admission 3. Cardiology, [facility], in 2 weeks for a-fib management, possible Holter 4. Phenytoin level please in the next week and dose adjustment if needed 5. Transition from lovenox to warfarin   Discharge Condition:  Stable, improved CODE STATUS:  DNR Diet recommendation:  regular   Brief/Interim Summary:  Brandi Heath a 82 y.o.femalewith a past medical history of dementia, previous episodes of small bowel obstruction, atrial fibrillation on anticoagulation, history of PE and DVT, history of stroke, history of seizure disorder who was in her usual state of health at her nursing facility, [facility], when she started having nausea followed by vomiting. She was admitted with small bowel obstruction secondary to adhesions from previous surgery. She was also in Afib with RVR and started on IV diltiazem infusion. She was converted to oral diltiazem.  On 9/20 she developed sudden onset numbness of the left extremities and slurred speech which quickly resolved.  MRI was negative for stroke but I suspect she had a TIA.  Due to a drug-drug interaction between apixaban and phenytoin and due to holding a/c from SBO, she was underanticoagulated and at risk for stroke/TIA.  She has been transitioned to lovenox for now to bridge to warfarin at SNF.  She also had recurrent a-fib with RVR and was seen by cardiology.  She has been stable now on diltiazem and metoprolol for several days but will need cardiology follow up.  Because of her RVR, she developed some acute on chronic diastolic heart failure and was diuresed for several days.  Her breathing was markedly improved, but  she continues to have significant bilateral lower extremity edema.  Her lasix dose was recently increased to 40mg  daily so I will continue that dose at discharge.  Finally, due to another drug-drug interaction, I have changed her simvastatin to atorvastatin.     Discharge Diagnoses:  Principal Problem:   SBO (small bowel obstruction) (HCC) Active Problems:   Dementia with behavioral disturbance   Atrial fibrillation with rapid ventricular response (HCC)   Personal history of PE (pulmonary embolism)   History of CVA (cerebrovascular accident)   Personal history of DVT (deep vein thrombosis)   Chronic CHF (congestive heart failure) (HCC)   Acute on chronic diastolic heart failure (HCC)   Normocytic anemia   Thrombocytopenia (HCC)   TIA (transient ischemic attack)   Left sided numbness  Small bowel obstruction resolved with conservative measures.  She was followed by general surgery.  Her NG tube was removed on 9/19.  Tolerating regular diet and having regular BMs at discharge.  TIA with transient left sided numbness and worsening slurred speech in setting of a-fib.   -  MRI/MRA brain:  No evidence of acute stroke -  UA neg -  LDL 28 -  A1c 5.8 -  Carotid duplex 1-39 percent bilaterally, antegrade vertebral artery flow  -  ECHO:  EF 40-45 percent, moderate to severe mitral valve regurgitation, thin mobile density in the right atrium that could represent Chiari, but could not exclude intraatrial connection.  No obvious thrombus within atrial septal aneurysm.   -  stopped NOAC and started lovenox  -  Transition to warfarin at SNF unless family prefers to continue lovenox indefinitely  Acute respiratory failure with hypoxia and wheezing due to acute on chronic diastolic heart failure secondary to a-fib with RVR.  Persistent productive cough of brown phlegm.  Afebrile, no leukocytosis.  -  CXR:  Persistent interstitial edema -  Diuresed with lasix 80mg  IV -  Added guaifenesin -  Changed  to lasix 40mg  po daily at discharge -  Daily weights and please increase lasix for more than 2-3-lbs of weight gain  Atrial fibrillation with RVR with intermittent atrial flutter and difficult to control rate:  CHA2DS2 VASc score is 8.  Tachycardia to the 130s-150s but then down to 50s and 60s.   -  Cardiology assistance appreciated -  Continue diltiazem -  Metoprolol increased to 100mg  po BID -  TSH 2.030 wnl -  Plan to discharge on lovenox after discussion with her PCP Dr. Merrilee SeashoreAnne Alexander.  Easier to start warfarin at SNF.   -  Plan for possible Holter as outpatient  Heme positive gastric fluid: No overt hematemesis has been noted.  Hemoglobin stable near 9.8 mg/dl -  Continued PPI  Dementia: Stable. Monitor closely.  Seizure disorder on Dilantin.  Stable -  Level was initially elevated but may have been due to changes in absorption from SBO -  Resumed previous dose of oral phenytoin at discharge since eating and drinking well but would check a level in the next week to be sure things are back to normal  History of RLE DVT and PE: Not an active issue per family. She does have lower extremity edema, which is getting better per family since she was placed on Lasix and the dose was increased recently. Continue to monitor for now.  Probable CKD stage 3, baseline creatinine 0.9 to 1.3.  Creatinine marginally improved -  avoid nephrotoxins, renally dose medications.  Hypokalemia, improved with supplementation.   Normocytic anemia, folate, B12, TSH, and iron studies wnl.  Likely due to chronic disease and mild CKD  Mild thrombocytopenia, likely due to hemodilution from IVF and resolving    Discharge Instructions  Discharge Instructions    (HEART FAILURE PATIENTS) Call MD:  Anytime you have any of the following symptoms: 1) 3 pound weight gain in 24 hours or 5 pounds in 1 week 2) shortness of breath, with or without a dry hacking cough 3) swelling in the hands, feet or stomach  4) if you have to sleep on extra pillows at night in order to breathe.    Complete by:  As directed    Call MD for:  difficulty breathing, headache or visual disturbances    Complete by:  As directed    Call MD for:  hives    Complete by:  As directed    Call MD for:  persistant dizziness or light-headedness    Complete by:  As directed    Call MD for:  persistant nausea and vomiting    Complete by:  As directed    Call MD for:  severe uncontrolled pain    Complete by:  As directed    Call MD for:  temperature >100.4    Complete by:  As directed    Diet general    Complete by:  As directed    Increase activity slowly    Complete by:  As directed        Medication List    STOP taking these medications   docusate sodium 100 MG capsule Commonly known as:  COLACE   ELIQUIS 2.5 MG Tabs tablet Generic  drug:  apixaban   fexofenadine 180 MG tablet Commonly known as:  ALLEGRA   metoCLOPramide 10 MG tablet Commonly known as:  REGLAN   mirabegron ER 25 MG Tb24 tablet Commonly known as:  MYRBETRIQ   simvastatin 20 MG tablet Commonly known as:  ZOCOR   vitamin B-12 1000 MCG tablet Commonly known as:  CYANOCOBALAMIN     TAKE these medications   calcium-vitamin D 500-200 MG-UNIT tablet Take 1 tablet by mouth daily.   DILANTIN 100 MG ER capsule Generic drug:  phenytoin Take 200 mg by mouth 2 (two) times daily.   diltiazem 360 MG 24 hr capsule Commonly known as:  TIAZAC Take 360 mg by mouth daily.   donepezil 10 MG tablet Commonly known as:  ARICEPT Take 10 mg by mouth at bedtime.   DULoxetine 60 MG capsule Commonly known as:  CYMBALTA Take 60 mg by mouth at bedtime.   enoxaparin 100 MG/ML injection Commonly known as:  LOVENOX Inject 1 mL (100 mg total) into the skin daily.   ENSURE ENLIVE PO Take 1 Can by mouth daily before breakfast.   furosemide 40 MG tablet Commonly known as:  LASIX Take 40 mg by mouth daily.   FUSION PLUS Caps Take 1 capsule by mouth  daily.   GUAIFENESIN DM PO Take 10 mLs by mouth every 4 (four) hours as needed (Cough).   loperamide 2 MG capsule Commonly known as:  IMODIUM Take 4 mg by mouth 4 (four) times daily as needed for diarrhea or loose stools.   Magnesium Oxide -Mg Supplement 400 MG Caps Take 1 tablet by mouth daily.   metoprolol 100 MG tablet Commonly known as:  LOPRESSOR Take 1 tablet (100 mg total) by mouth 2 (two) times daily. What changed:  medication strength  how much to take  when to take this  additional instructions   multivitamin with minerals tablet Take 1 tablet by mouth daily.   omeprazole 40 MG capsule Commonly known as:  PRILOSEC Take 40 mg by mouth 2 (two) times daily.   PHENERGAN 25 MG suppository Generic drug:  promethazine Place 25 mg rectally every 12 (twelve) hours as needed for nausea or vomiting.   polyethylene glycol packet Commonly known as:  MIRALAX / GLYCOLAX Take 17 g by mouth daily. Hold for more than 3 bms in a day   potassium chloride SA 20 MEQ tablet Commonly known as:  K-DUR,KLOR-CON Take 1 tablet (20 mEq total) by mouth daily.   RISPERDAL 1 MG tablet Generic drug:  risperiDONE Take 1 mg by mouth 2 (two) times daily.   travoprost (benzalkonium) 0.004 % ophthalmic solution Commonly known as:  TRAVATAN Place 1 drop into Heath eyes at bedtime.   TYLENOL 325 MG tablet Generic drug:  acetaminophen Take 650 mg by mouth every 4 (four) hours as needed (Pain). pain   acetaminophen 650 MG suppository Commonly known as:  TYLENOL Place 650 mg rectally every 6 (six) hours. For 2 days. Started 9/17      Follow-up Information    Merrilee Seashore, MD .   Specialty:  Internal Medicine Contact information: [address] [place] [address] [phone]        Sherrill Raring, MD. Schedule an appointment as soon as possible for a visit in 2 week(s).   Specialty:  Family Medicine Why:  atrial fibrillation Contact information: [address] [facility] [place] [address] [phone]          Allergies  Allergen  Reactions  . Chocolate Diarrhea  . Lactose Intolerance (Gi)   . Penicillins     From [facility]    Consultations:  General surgery  Cardiology   Procedures/Studies: Mr Shirlee Latch YN Contrast  Result Date: 10/03/2015 CLINICAL DATA:  Code stroke. Acute mental status changes beginning 90 minutes ago. Lethargy. Left-sided numbness. EXAM: MRI HEAD WITHOUT CONTRAST MRA HEAD WITHOUT CONTRAST TECHNIQUE: Multiplanar, multiecho pulse sequences of the brain and surrounding structures were obtained without intravenous contrast. Angiographic images of the head were obtained using MRA technique without contrast. COMPARISON:  None. FINDINGS: MRI HEAD FINDINGS Brain: Diffusion imaging does not show any acute or subacute infarction. The brainstem is normal. There is cerebellar atrophy. Cerebral hemispheres show generalized age related atrophy with chronic small-vessel ischemic change of the deep white matter. No large vessel territory infarction. No mass lesion, hemorrhage, hydrocephalus or extra-axial collection. No pituitary mass. Vascular: Major vessels are patent at the base of the brain. Skull and upper cervical spine: Negative Sinuses/Orbits: Small mastoid effusions. Other: None significant MRA HEAD FINDINGS Heath internal carotid arteries are widely patent through the skullbase. On the left, the anterior and middle cerebral vessels are patent without proximal stenosis, aneurysm or vascular malformation. More distal branch vessels show atherosclerotic irregularity. On the right, the anterior cerebral branches are patent. There is what I believe is artifactual signal loss within the right middle cerebral artery territory related to downward tortuosity. In evaluating the source images, that vessel appears to be widely patent. Heath vertebral arteries are widely patent to the basilar. No basilar stenosis. Posterior circulation  branch vessels are normal. IMPRESSION: No acute infarction. Generalized age related atrophy and chronic small vessel change of the cerebral hemispheric deep white matter. Intracranial MR angiography shows patency of the large and medium size vessels. There is artifactual signal loss within the right middle cerebral artery related to downward tortuosity. Evaluation of the source images shows that vessel to be patent. The patient does have some distal vessel atherosclerotic irregularity. Electronically Signed   By: Paulina Fusi M.D.   On: 10/03/2015 12:15   Mr Brain Wo Contrast  Result Date: 10/03/2015 CLINICAL DATA:  Code stroke. Acute mental status changes beginning 90 minutes ago. Lethargy. Left-sided numbness. EXAM: MRI HEAD WITHOUT CONTRAST MRA HEAD WITHOUT CONTRAST TECHNIQUE: Multiplanar, multiecho pulse sequences of the brain and surrounding structures were obtained without intravenous contrast. Angiographic images of the head were obtained using MRA technique without contrast. COMPARISON:  None. FINDINGS: MRI HEAD FINDINGS Brain: Diffusion imaging does not show any acute or subacute infarction. The brainstem is normal. There is cerebellar atrophy. Cerebral hemispheres show generalized age related atrophy with chronic small-vessel ischemic change of the deep white matter. No large vessel territory infarction. No mass lesion, hemorrhage, hydrocephalus or extra-axial collection. No pituitary mass. Vascular: Major vessels are patent at the base of the brain. Skull and upper cervical spine: Negative Sinuses/Orbits: Small mastoid effusions. Other: None significant MRA HEAD FINDINGS Heath internal carotid arteries are widely patent through the skullbase. On the left, the anterior and middle cerebral vessels are patent without proximal stenosis, aneurysm or vascular malformation. More distal branch vessels show atherosclerotic irregularity. On the right, the anterior cerebral branches are patent. There is what I  believe is artifactual signal loss within the right middle cerebral artery territory related to downward tortuosity. In evaluating the source images, that vessel appears to be widely patent. Heath vertebral arteries are widely patent to the basilar. No basilar stenosis. Posterior circulation branch vessels are normal.  IMPRESSION: No acute infarction. Generalized age related atrophy and chronic small vessel change of the cerebral hemispheric deep white matter. Intracranial MR angiography shows patency of the large and medium size vessels. There is artifactual signal loss within the right middle cerebral artery related to downward tortuosity. Evaluation of the source images shows that vessel to be patent. The patient does have some distal vessel atherosclerotic irregularity. Electronically Signed   By: Paulina Fusi M.D.   On: 10/03/2015 12:15   Ct Abdomen Pelvis W Contrast  Result Date: 10/01/2015 CLINICAL DATA:  Nursing home patient with dementia, presenting with fecal emesis. Evaluate for bowel obstruction. EXAM: CT ABDOMEN AND PELVIS WITH CONTRAST TECHNIQUE: Multidetector CT imaging of the abdomen and pelvis was performed using the standard protocol following bolus administration of intravenous contrast. CONTRAST:  75mL ISOVUE-300 IOPAMIDOL (ISOVUE-300) INJECTION 61% COMPARISON:  None. FINDINGS: Lower chest: The heart is enlarged. There is atherosclerosis of the coronary arteries. No significant pleural or pericardial effusion. No confluent basilar airspace disease. Hepatobiliary: No focal hepatic abnormalities are identified. There is no significant biliary dilatation status post cholecystectomy. Pancreas: Diffusely atrophied. No evidence of pancreatic mass, ductal dilatation or surrounding inflammation. Spleen: Normal in size without focal abnormality. Adrenals/Urinary Tract: Heath adrenal glands appear normal. Heath kidneys demonstrate cortical thinning. There are cysts in the lower pole of the right kidney,  measuring up to 3.2 cm in diameter. No evidence of renal mass, urinary tract calculus or hydronephrosis. The bladder appears unremarkable for its degree of distention. Stomach/Bowel: Small hiatal hernia. The stomach, proximal and mid small bowel are mildly dilated with multiple air-fluid levels. The distal small bowel is decompressed. The transition point is suspected to be in the false pelvis (axial image 56). No surrounding soft tissue mass or fluid collection is apparent in this area. The colon is decompressed. No apparent fistula or focal extraluminal fluid collection. There is a small amount of free pelvic fluid. Vascular/Lymphatic: There are no enlarged abdominal or pelvic lymph nodes. Patient has an aorto bi-iliac stent graft which appears patent. 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 No evidence of endograft leak. Heath internal iliac arteries appear thrombosed status post probable embolization on the right. The external iliac arteries are patent. IVC filter noted with mild strut penetration. Reproductive: Hysterectomy.  No evidence of adnexal mass. Other: There is a small amount of ascites. There is generalized edema throughout the mesenteric and subcutaneous fat. No focal extraluminal fluid collections are seen. Musculoskeletal: No acute or significant osseous findings. The bones are demineralized. There is a possible sacral decubitus ulcer. No evidence of bone destruction. Lower lumbar spondylosis noted. IMPRESSION: 1. Findings are consistent with a mid to distal small bowel obstruction. Transition point appears to be in the false pelvis without discernible cause, suggesting adhesions. 2. No fistula or abscess identified. 3. Generalized soft tissue edema with mild pelvic ascites. 4. Patent aortic stent graft. Electronically Signed   By: Carey Bullocks M.D.   On: 10/01/2015 13:50   Dg Chest Port 1 View  Result Date: 10/07/2015 CLINICAL DATA:  Cough.  H/o A-fib, CHF, CAD, HTN, PE, and Stroke. Nonsmoker. EXAM: PORTABLE CHEST 1 VIEW COMPARISON:  10/03/2015 FINDINGS: Midline trachea. Cardiomegaly accentuated by AP portable technique. Right hemidiaphragm elevation. No pleural effusion or pneumothorax. Similar mild pulmonary interstitial prominence. Lower lobe predominant reticular nodular opacities are favored to be calcified. No well-defined lobar consolidation. IMPRESSION: Cardiomegaly with similar nonspecific pulmonary interstitial thickening. Cannot exclude pulmonary  venous congestion. Concurrent lower lobe predominant reticular nodular opacity may represent tiny underlying calcified pulmonary nodules. Likely the sequelae of remote infection or inflammation. No typical findings of acute pneumonia. Electronically Signed   By: Jeronimo Greaves M.D.   On: 10/07/2015 11:40   Dg Chest Port 1 View  Result Date: 10/03/2015 CLINICAL DATA:  Abnormal breath sounds. EXAM: PORTABLE CHEST 1 VIEW COMPARISON:  10/01/2015. FINDINGS: Cardiomegaly with pulmonary vascular prominence. Mild right perihilar interstitial prominence noted. Mild interstitial edema and/or pneumonitis cannot be excluded. Left pleural effusion. No pneumothorax IMPRESSION: 1.  Interim removal of NG tube. 2. Cardiomegaly with mild pulmonary venous congestion. Mild right perihilar interstitial prominence. Mild perihilar pulmonary edema and/or pneumonitis cannot be excluded. Electronically Signed   By: Maisie Fus  Register   On: 10/03/2015 11:21   Dg Chest Portable 1 View  Result Date: 10/01/2015 CLINICAL DATA:  Nasogastric tube placement. Decreased oxygen saturation. EXAM: PORTABLE CHEST 1 VIEW COMPARISON:  None. FINDINGS: Nasogastric tube tip and side port are below the diaphragm. No pneumothorax. There is fine nodular interstitial disease throughout the lungs with a mid to lower lung lung zone predominance. There is no airspace consolidation or volume loss. The heart size and pulmonary vascular normal. No  adenopathy. There is atherosclerotic calcification in the aorta. There is degenerative change in each shoulder. IMPRESSION: Nasogastric tube tip and side port below the diaphragm. Fine nodular interstitial disease throughout the lungs of uncertain etiology or chronicity. No frank edema or consolidation. Cardiac silhouette within normal limits. There is aortic atherosclerosis. Electronically Signed   By: Bretta Bang III M.D.   On: 10/01/2015 14:21   Dg Abd Portable 1v-small Bowel Obstruction Protocol-initial, 8 Hr Delay  Result Date: 10/02/2015 CLINICAL DATA:  Small bowel obstruction.  8 hour post contrast film. EXAM: PORTABLE ABDOMEN - 1 VIEW COMPARISON:  10/01/2015 FINDINGS: Enteric tube tip is in the right upper quadrant, likely in the distal stomach or duodenum bulb region. Gaseous distention of the stomach. Contrast material is demonstrated throughout the colon suggesting no evidence of complete small bowel obstruction. Vascular stents and coils. Inferior vena caval filter. Surgical clips in the right abdomen. IMPRESSION: Contrast material is present in the colon suggesting no evidence of complete small bowel obstruction. Electronically Signed   By: Burman Nieves M.D.   On: 10/02/2015 04:40   Dg Abd Portable 1v-small Bowel Protocol-position Verification  Result Date: 10/01/2015 CLINICAL DATA:  NG tube placement. EXAM: PORTABLE ABDOMEN - 1 VIEW COMPARISON:  CT 10/01/2015 FINDINGS: NG tube is in place with the tip in the perihilar region, possibly distal stomach or proximal duodenum. Side port is in the distal stomach. Decreasing small bowel dilatation. Prior cholecystectomy. No free air. IMPRESSION: NG tube tip in the peripyloric region, possibly distal stomach or proximal duodenum. Decreasing small bowel dilatation. Electronically Signed   By: Charlett Nose M.D.   On: 10/01/2015 16:21    Subjective:  Breathing much easier today.  BOwels and bladder working well.  Denies pain.    Discharge  Exam: Vitals:   10/07/15 2049 10/08/15 0550  BP: 116/78 107/68  Pulse: 78 72  Resp: 16 18  Temp: 98.4 F (36.9 C) 97.5 F (36.4 C)   Vitals:   10/07/15 0549 10/07/15 1412 10/07/15 2049 10/08/15 0550  BP: 97/71 92/62 116/78 107/68  Pulse: (!) 52 76 78 72  Resp: 18 16 16 18   Temp: 98.4 F (36.9 C) 97.4 F (36.3 C) 98.4 F (36.9 C) 97.5 F (36.4 C)  TempSrc: Oral  Oral Oral Oral  SpO2: 100% 100% 100% 100%  Weight:      Height:        General exam:  Adult female, Awake, alert  No acute distress.  HEENT:  NCAT, MMM Respiratory system:  Faint rales at the bilateral bases that clear somewhat with repeat respirations, no rhonchi or wheeze Cardiovascular system:  RRR, normal S1/S2. 2/6 systolic murmur.  No gallops or clicks.  Warm extremities Gastrointestinal system: Normal active bowel sounds, soft, nondistended, nontender. MSK:  Normal tone and bulk, 2+ pitting bilateral lower extremity edema slightly worse on right Neuro:  4/5 strength left upper and lower extremities.  Mild dysmetria left extremities.  No obvious facial droop.  Sensation intact to light touch throughout.  Drift on the left extremities.  No obvious field cuts.  No hemineglect.     The results of significant diagnostics from this hospitalization (including imaging, microbiology, ancillary and laboratory) are listed below for reference.     Microbiology: Recent Results (from the past 240 hour(s))  MRSA PCR Screening     Status: None   Collection Time: 10/01/15  5:14 PM  Result Value Ref Range Status   MRSA by PCR NEGATIVE NEGATIVE Final    Comment:        The GeneXpert MRSA Assay (FDA approved for NASAL specimens only), is one component of a comprehensive MRSA colonization surveillance program. It is not intended to diagnose MRSA infection nor to guide or monitor treatment for MRSA infections.      Labs: BNP (last 3 results) No results for input(s): BNP in the last 8760 hours. Basic Metabolic  Panel:  Recent Labs Lab 10/03/15 0415 10/04/15 0405 10/05/15 0522 10/06/15 0548 10/07/15 0549 10/08/15 0510  NA 143 141 140 139 139 137  K 2.9* 3.6 3.7 3.0* 3.8 3.2*  CL 106 106 105 101 102 98*  CO2 32 30 29 31 30  32  GLUCOSE 103* 113* 111* 96 116* 103*  BUN 22* 18 17 15 16 14   CREATININE 0.93 0.96 1.04* 0.90 1.08* 1.05*  CALCIUM 7.9* 8.0* 8.4* 8.4* 8.4* 8.5*  MG 2.1  --   --   --   --   --    Liver Function Tests:  Recent Labs Lab 10/02/15 0214 10/03/15 0415  AST 40 19  ALT 15 12*  ALKPHOS 137* 111  BILITOT 0.9 0.7  PROT 6.3* 5.4*  ALBUMIN 3.1* 2.8*   No results for input(s): LIPASE, AMYLASE in the last 168 hours. No results for input(s): AMMONIA in the last 168 hours. CBC:  Recent Labs Lab 10/03/15 0415 10/04/15 0405 10/05/15 0522 10/06/15 0548 10/07/15 0549  WBC 6.6 5.2 4.9 6.2 4.5  NEUTROABS 4.6  --   --   --   --   HGB 9.7* 9.2* 9.8* 10.3* 9.9*  HCT 29.2* 27.3* 28.6* 30.2* 29.1*  MCV 88.2 87.5 85.6 85.1 84.3  PLT 135* 122* 140* 176 200   Cardiac Enzymes: No results for input(s): CKTOTAL, CKMB, CKMBINDEX, TROPONINI in the last 168 hours. BNP: Invalid input(s): POCBNP CBG: No results for input(s): GLUCAP in the last 168 hours. D-Dimer No results for input(s): DDIMER in the last 72 hours. Hgb A1c No results for input(s): HGBA1C in the last 72 hours. Lipid Profile No results for input(s): CHOL, HDL, LDLCALC, TRIG, CHOLHDL, LDLDIRECT in the last 72 hours. Thyroid function studies No results for input(s): TSH, T4TOTAL, T3FREE, THYROIDAB in the last 72 hours.  Invalid input(s): FREET3 Anemia work up No results  for input(s): VITAMINB12, FOLATE, FERRITIN, TIBC, IRON, RETICCTPCT in the last 72 hours. Urinalysis    Component Value Date/Time   COLORURINE YELLOW 10/01/2015 1022   APPEARANCEUR CLEAR 10/01/2015 1022   LABSPEC 1.041 (H) 10/01/2015 1022   PHURINE 7.5 10/01/2015 1022   GLUCOSEU NEGATIVE 10/01/2015 1022   HGBUR NEGATIVE 10/01/2015 1022    BILIRUBINUR SMALL (A) 10/01/2015 1022   KETONESUR NEGATIVE 10/01/2015 1022   PROTEINUR NEGATIVE 10/01/2015 1022   NITRITE NEGATIVE 10/01/2015 1022   LEUKOCYTESUR NEGATIVE 10/01/2015 1022   Sepsis Labs Invalid input(s): PROCALCITONIN,  WBC,  LACTICIDVEN   Time coordinating discharge: Over 30 minutes  SIGNED:   Renae Fickle, MD  [facility] 10/08/2015, 1:11 PM Pager   If 7PM-7AM, please contact night-coverage [email or web address] Password [email or web address]

## 2015-10-08 NOTE — Progress Notes (Signed)
Nutrition Follow-up  DOCUMENTATION CODES:   Not applicable  INTERVENTION:  Continue adequate intake of calories and protein with meals and beverages.  No interventions warranted at this time as patient is meeting 100% calorie and protein needs through meals. Will continue to monitor.  NUTRITION DIAGNOSIS:   Inadequate oral intake related to acute illness, nausea, vomiting as evidenced by per patient/family report.  Improving.  GOAL:   Patient will meet greater than or equal to 90% of their needs  Met.  MONITOR:   PO intake, Diet advancement, Weight trends, Labs, I & O's  REASON FOR ASSESSMENT:   Malnutrition Screening Tool    ASSESSMENT:   82 y.o. female with a past medical history of dementia, previous episodes of small bowel obstruction, atrial fibrillation on anticoagulation, history of PE and DVT, history of stroke, history of seizure disorder who was in her usual state of health at her nursing facility, [facility], until this past Thursday and Friday when she started having nausea followed by vomiting. Patient is a very limited historian. She does have a history of dementia. She apparently did okay on Saturday, but then started vomiting again on Sunday. Sunday evening, the nursing facility, got a x-ray done, which showed evidence for obstruction, according to the daughter. The patient got worse overnight and she was brought to the ED. There has been no history of any abdominal discomfort.  On 9/20 pt noted to be lethargic, difficult to arouse. She c/o left sided weakness, numbness in left arm, leg, and face, speech "different." MRI was negative for stroke. Was made NPO pending swallow eval. Swallow evaluation on 9/20 recommended regular diet with thin liquids.  Advanced to regular diet on 9/20.   Patient sleeping at time of assessment and did not awaken to calling name. No family present. Discussed plan with RN. Patient is not ordering meals - someone else is. Although  she has been sleeping both times an RD has seen her, she wakes for meals and is eating well.   Meal Completion: 50-100% per chart. Meals being ordered for patient have adequate calories and protein. In the past 24 hours patient has received 1914 kcal (100% estimated needs) and 88.5 grams protein (100% estimated needs).   Medications reviewed and include: Pepcid, Protonix.   Labs reviewed: Potassium 3.2, Chloride 98, Glucose 103.   Diet Order:  Diet regular Room service appropriate? Yes; Fluid consistency: Thin  Skin:  Reviewed, no issues  Last BM:  10/06/2015  Height:   Ht Readings from Last 1 Encounters:  10/01/15 5' 7"  (1.702 m)    Weight:   Wt Readings from Last 1 Encounters:  10/04/15 155 lb 3.3 oz (70.4 kg)    Ideal Body Weight:  61.36 kg  BMI:  Body mass index is 24.31 kg/m.  Estimated Nutritional Needs:   Kcal:  1350-1550  Protein:  55-65 grams  Fluid:  >/= 1.7 L/day  EDUCATION NEEDS:   No education needs identified at this time  Willey Blade, MS, RD, LDN Pager: [phone] After Hours Pager: [phone]

## 2015-10-08 NOTE — Progress Notes (Signed)
Physical Therapy Treatment Patient Details Name: Brandi Heath MRN: [id number] DOB: [birth year] Today's Date: 10/08/2015    History of Present Illness Brandi Heath is a 82 y.o. female adm with SBO;   past medical history of dementia, previous episodes of small bowel obstruction, atrial fibrillation on anticoagulation, history of PE and DVT, history of stroke, history of seizure disorder who was in her usual state of health at her nursing facility, [facility]    PT Comments    Found pt to be incont urine/bowel in bed.  Performed side to side rolling for hygiene then supine to sit to EOB.  Noted increased coughing with congestion.  RN notified.  Assisted to recliner and positioned to comfort.    Follow Up Recommendations  SNF ([facility])     Equipment Recommendations  None recommended by PT    Recommendations for Other Services       Precautions / Restrictions Precautions Precautions: Fall Restrictions Weight Bearing Restrictions: No    Mobility  Bed Mobility Overal bed mobility: Needs Assistance Bed Mobility: Rolling;Sidelying to Sit;Supine to Sit Rolling: Mod assist Sidelying to sit: Mod assist Supine to sit: Mod assist     General bed mobility comments: increased time with side to side rolling for hygiene(incont urine) and supine to sit with use of rail  Transfers Overall transfer level: Needs assistance Equipment used: None Transfers: Sit to/from [employer] Sit to Stand: Mod assist;+2 physical assistance;+2 safety/equipment Stand pivot transfers: Max assist;+2 physical assistance;+2 safety/equipment       General transfer comment: cues for safety, hand placement, assist to rise, balance and pivot ti chair from elevated bed  Ambulation/Gait                 Stairs            Wheelchair Mobility    Modified Rankin (Stroke Patients Only)       Balance                                    Cognition  Arousal/Alertness: Awake/alert Behavior During Therapy: WFL for tasks assessed/performed Overall Cognitive Status: History of cognitive impairments - at baseline                      Exercises      General Comments        Pertinent Vitals/Pain Pain Assessment: No/denies pain    Home Living                      Prior Function            PT Goals (current goals can now be found in the care plan section) Progress towards PT goals: Progressing toward goals    Frequency    Min 2X/week      PT Plan Current plan remains appropriate    Co-evaluation             End of Session Equipment Utilized During Treatment: Gait belt Activity Tolerance: Patient tolerated treatment well Patient left: in chair;with call bell/phone within reach;with chair alarm set       Felecia Shelling  PTA [facility]  Acute  Rehab Pager      [phone]

## 2015-10-08 NOTE — Progress Notes (Signed)
Report called to [place] at [facility].

## 2015-10-09 ENCOUNTER — Encounter: Payer: Self-pay | Admitting: Internal Medicine

## 2015-10-09 ENCOUNTER — Non-Acute Institutional Stay (SKILLED_NURSING_FACILITY): Payer: Medicare Other | Admitting: Internal Medicine

## 2015-10-09 DIAGNOSIS — F329 Major depressive disorder, single episode, unspecified: Secondary | ICD-10-CM | POA: Diagnosis not present

## 2015-10-09 DIAGNOSIS — G451 Carotid artery syndrome (hemispheric): Secondary | ICD-10-CM | POA: Diagnosis not present

## 2015-10-09 DIAGNOSIS — K219 Gastro-esophageal reflux disease without esophagitis: Secondary | ICD-10-CM | POA: Diagnosis not present

## 2015-10-09 DIAGNOSIS — I4891 Unspecified atrial fibrillation: Secondary | ICD-10-CM

## 2015-10-09 DIAGNOSIS — J9601 Acute respiratory failure with hypoxia: Secondary | ICD-10-CM | POA: Diagnosis not present

## 2015-10-09 DIAGNOSIS — D696 Thrombocytopenia, unspecified: Secondary | ICD-10-CM | POA: Diagnosis not present

## 2015-10-09 DIAGNOSIS — K56609 Unspecified intestinal obstruction, unspecified as to partial versus complete obstruction: Secondary | ICD-10-CM

## 2015-10-09 DIAGNOSIS — I5033 Acute on chronic diastolic (congestive) heart failure: Secondary | ICD-10-CM

## 2015-10-09 DIAGNOSIS — K5669 Other intestinal obstruction: Secondary | ICD-10-CM

## 2015-10-09 DIAGNOSIS — R569 Unspecified convulsions: Secondary | ICD-10-CM

## 2015-10-09 DIAGNOSIS — E782 Mixed hyperlipidemia: Secondary | ICD-10-CM | POA: Diagnosis not present

## 2015-10-09 DIAGNOSIS — F32A Depression, unspecified: Secondary | ICD-10-CM

## 2015-10-09 NOTE — Progress Notes (Signed)
: Provider:  Randon GoldsmithAnne D. Lyn HollingsheadAlexander, MD Location:  [facility] Room Number: 211D Place of Service:  SNF ([phone])  PCP: Merrilee SeashoreAnne Jonnathan Birman, MD Patient Care Team: Margit HanksAnne D Michelena Culmer, MD as PCP - General (Internal Medicine)  Extended Emergency Contact Information Primary Emergency Contact: Tillison,Tammy Address: [address]          [place], [place] [id number] [place] Mobile Phone: [phone] Relation: Daughter Secondary Emergency Contact: Gale JourneyBivens,Billy  [place] Mobile Phone: [phone] Relation: Daughter     Allergies: Chocolate; Lactose intolerance (gi); and Penicillins  Chief Complaint  Patient presents with  . New Admit To SNF    Admit to Facility    HPI: Patient is 82 y.o. female with a past medical history of dementia, previous episodes of small bowel obstruction, atrial fibrillation on anticoagulation, history of PE and DVT, history of stroke, history of seizure disorder who was in her usual state of health at her nursing facility[facility], when she started having nausea followed by vomiting. She was [facility]18[facility] with small bowel obstruction secondary to adhesions from previous surgery. While hospitalized pt had 2 episodes of AF with RVR, resolved finally and stable on oral meds. 2/2 AF wih RVR pt has episode of acute on chronic dCHF tx with several days diuresis, with cont po diuresis. Pt also had an episode of numbness of L extremities and slurred speech, quickly resolved but felt to be a TIA, neg CT. Anticoagulation was an issue, first because of SBO, then because pt's siezure med, dilantin interacts with . Per phone conversation with hospitalist it was decided to d/c pt to SNF on lovenox so we can work out issues of drug-drug interactions at SNF. Pt is admitted to SNF for residential care. While at SNF pt will be followed for dementia, tx with aricept, mood disorder, tx with risperdal and depression, tx with  cymbalta.  Past Medical History:  Diagnosis Date  . Atrial fibrillation (HCC)   . CHF (congestive heart failure) (HCC)   . Clotting disorder (HCC)   . Coronary artery disease   . Dementia without behavioral disturbance   . DVT (deep venous thrombosis) (HCC)   . Hyperlipidemia   . Hypertension   . OSA (obstructive sleep apnea)   . PE (pulmonary embolism)   . Polyneuropathy (HCC)   . SBO (small bowel obstruction)   . Seizures (HCC)   . Stroke [facility](HCC)     Past Surgical History:  Procedure Laterality Date  . ABDOMINAL AORTIC ANEURYSM REPAIR    . ABDOMINAL HYSTERECTOMY    . CHOLECYSTECTOMY    . SPINAL FUSION  2003      Medication List       Accurate as of 10/09/15 11:59 PM. Always use your most recent med list.          calcium-vitamin D 500-200 MG-UNIT tablet Take 1 tablet by mouth daily.   DILANTIN 100 MG ER capsule Generic drug:  phenytoin Take 200 mg by mouth 2 (two) times daily.   diltiazem 360 MG 24 hr capsule Commonly known as:  TIAZAC Take 360 mg by mouth daily.   donepezil 10 MG tablet Commonly known as:  ARICEPT Take 10 mg by mouth at bedtime.   DULoxetine 60 MG capsule Commonly known as:  CYMBALTA Take 60 mg by mouth at bedtime.   enoxaparin 100 MG/ML injection Commonly known as:  LOVENOX Inject 1 mL (100 mg total) into the  skin daily.   ENSURE ENLIVE PO Take 1 Can by mouth daily before breakfast.   furosemide 40 MG tablet Commonly known as:  LASIX Take 40 mg by mouth daily.   FUSION PLUS Caps Take 1 capsule by mouth daily.   GUAIFENESIN DM PO Take 10 mLs by mouth every 4 (four) hours as needed (Cough).   loperamide 2 MG capsule Commonly known as:  IMODIUM Take 4 mg by mouth 4 (four) times daily as needed for diarrhea or loose stools.   Magnesium Oxide -Mg Supplement 400 MG Caps Take 1 tablet by mouth daily.   metoprolol 100 MG tablet Commonly known as:  LOPRESSOR Take 1 tablet (100 mg total) by mouth 2 (two) times daily.     multivitamin with minerals tablet Take 1 tablet by mouth daily.   omeprazole 40 MG capsule Commonly known as:  PRILOSEC Take 40 mg by mouth 2 (two) times daily.   PHENERGAN 25 MG suppository Generic drug:  promethazine Place 25 mg rectally every 12 (twelve) hours as needed for nausea or vomiting.   polyethylene glycol packet Commonly known as:  MIRALAX / GLYCOLAX Take 17 g by mouth daily. Hold for more than 3 bms in a day   potassium chloride SA 20 MEQ tablet Commonly known as:  K-DUR,KLOR-CON Take 1 tablet (20 mEq total) by mouth daily.   RISPERDAL 1 MG tablet Generic drug:  risperiDONE Take 1 mg by mouth 2 (two) times daily.   travoprost (benzalkonium) 0.004 % ophthalmic solution Commonly known as:  TRAVATAN Place 1 drop into both eyes at bedtime.   TYLENOL 325 MG tablet Generic drug:  acetaminophen Take 650 mg by mouth every 4 (four) hours as needed (Pain). pain   acetaminophen 650 MG suppository Commonly known as:  TYLENOL Place 650 mg rectally every 6 (six) hours. For 2 days. Started 9/17       No orders of the defined types were placed in this encounter.   Immunization History  Administered Date(s) Administered  . PPD Test 04/16/2015, 05/02/2015    Social History  Substance Use Topics  . Smoking status: Never Smoker  . Smokeless tobacco: Never Used  . Alcohol use No    Family history is   Family History  Problem Relation Age of Onset  . Hypertension Mother   . Heart disease Mother   . Heart disease Father   . Cancer Sister     breast  . Diabetes Sister   . Heart disease Son   . Hypertension Son   . Thyroid disease Sister       Review of Systems  DATA OBTAINED: from patient, nurse GENERAL:  no fevers, fatigue, appetite changes SKIN: No itching, or rash EYES: No eye pain, redness, discharge EARS: No earache, tinnitus, change in hearing NOSE: No congestion, drainage or bleeding  MOUTH/THROAT: No mouth or tooth pain, No sore  throat RESPIRATORY: No cough, wheezing, SOB CARDIAC: No chest pain, palpitations, lower extremity edema  GI: No abdominal pain, No N/V/D or constipation, No heartburn or reflux  GU: No dysuria, frequency or urgency, or incontinence  MUSCULOSKELETAL: No unrelieved bone/joint pain NEUROLOGIC: No headache, dizziness or focal weakness PSYCHIATRIC: No c/o anxiety or sadness   Vitals:   10/09/15 0836  BP: 107/68  Pulse: 72  Resp: 18  Temp: 97.5 F (36.4 C)    SpO2 Readings from Last 1 Encounters:  10/08/15 100%   Body mass index is 24.31 kg/m.     Physical Exam  GENERAL  APPEARANCE: Alert,  No acute distress.  SKIN: No diaphoresis rash HEAD: Normocephalic, atraumatic  EYES: Conjunctiva/lids clear. Pupils round, reactive. EOMs intact.  EARS: External exam WNL, canals clear. Hearing grossly normal.  NOSE: No deformity or discharge.  MOUTH/THROAT: Lips w/o lesions  RESPIRATORY: Breathing is even, unlabored. Lung sounds are clear   CARDIOVASCULAR: Heart RRR 2/6 systolic  Murmur, no rubs or gallops. + peripheral edema.   GASTROINTESTINAL: Abdomen is soft, non-tender, not distended w/ normal bowel sounds. GENITOURINARY: Bladder non tender, not distended  MUSCULOSKELETAL: No abnormal joints or musculature NEUROLOGIC:  Cranial nerves 2-12 grossly intact. Moves all extremities  PSYCHIATRIC: Mood and affect appropriate to situation with dementia, no behavioral issues  Patient Active Problem List   Diagnosis Date Noted  . Acute respiratory failure with hypoxia (HCC) 10/17/2015  . Slurred speech   . Left sided numbness   . Acute on chronic diastolic heart failure (HCC) 10/04/2015  . Normocytic anemia 10/04/2015  . Thrombocytopenia (HCC) 10/04/2015  . TIA (transient ischemic attack) 10/04/2015  . Cough 10/02/2015  . SBO (small bowel obstruction) 10/01/2015  . Chronic CHF (congestive heart failure) (HCC) 09/29/2015  . Venous insufficiency (chronic) (peripheral) 09/29/2015  .  Vitamin B12 deficiency 09/29/2015  . Anemia, chronic disease 09/29/2015  . Enterococcus UTI 09/02/2015  . GERD (gastroesophageal reflux disease) 09/02/2015  . Adynamic ileus (HCC) 06/11/2015  . CHF, acute on chronic (HCC) 06/11/2015  . Personal history of DVT (deep vein thrombosis) 06/11/2015  . Nausea with vomiting 06/03/2015  . Jaw pain 05/27/2015  . E. coli UTI 04/22/2015  . Prerenal acute renal failure (HCC) 04/22/2015  . History of CVA (cerebrovascular accident) 04/22/2015  . AF (atrial fibrillation) (HCC) 04/22/2015  . Polyneuropathy (HCC) 04/22/2015  . Edema 01/25/2015  . Atrial fibrillation with rapid ventricular response (HCC) 01/20/2015  . Acute encephalopathy 01/20/2015  . UTI (urinary tract infection) 01/20/2015  . Tracheobronchomalacia 01/20/2015  . Personal history of PE (pulmonary embolism) 01/20/2015  . Depression 01/20/2015  . OSA (obstructive sleep apnea)   . Dementia with behavioral disturbance   . Seizures (HCC)   . Hyperlipidemia   . Hypertensive heart disease with CHF (congestive heart failure) (HCC)   . Clotting disorder (HCC)   . Cerebrovascular accident (CVA) due to thrombosis [facility])       Labs reviewed: Basic Metabolic Panel:    Component Value Date/Time   NA 137 10/08/2015 0510   NA 137 10/08/2015   K 3.2 (L) 10/08/2015 0510   CL 98 (L) 10/08/2015 0510   CO2 32 10/08/2015 0510   GLUCOSE 103 (H) 10/08/2015 0510   BUN 14 10/08/2015 0510   BUN 14 10/08/2015   CREATININE 1.05 (H) 10/08/2015 0510   CALCIUM 8.5 (L) 10/08/2015 0510   PROT 5.4 (L) 10/03/2015 0415   ALBUMIN 2.8 (L) 10/03/2015 0415   AST 40 (A) 10/04/2015   ALT 15 10/04/2015   ALKPHOS 137 (A) 10/04/2015   BILITOT 0.7 10/03/2015 0415   GFRNONAA 47 (L) 10/08/2015 0510   GFRAA 54 (L) 10/08/2015 0510     Recent Labs  10/03/15 0415  10/06/15 0548 10/07/15 10/07/15 0549 10/08/15 10/08/15 0510  NA 143  < > 139 139 139 137 137  K 2.9*  < > 3.0* 3.8 3.8  --  3.2*  CL 106  < >  101  --  102  --  98*  CO2 32  < > 31  --  30  --  32  GLUCOSE 103*  < >  96  --  116*  --  103*  BUN 22*  < > 15 16 16 14 14   CREATININE 0.93  < > 0.90 1.1 1.08* 1.1 1.05*  CALCIUM 7.9*  < > 8.4*  --  8.4*  --  8.5*  MG 2.1  --   --   --   --   --   --   < > = values in this interval not displayed. Liver Function Tests:  Recent Labs  10/01/15 1040 10/02/15 0214 10/03/15 0415 10/04/15  AST 28 40 19 40*  ALT 19 15 12* 15  ALKPHOS 171* 137* 111 137*  BILITOT 0.9 0.9 0.7  --   PROT 7.2 6.3* 5.4*  --   ALBUMIN 3.7 3.1* 2.8*  --     Recent Labs  10/01/15 1040  LIPASE 34   No results for input(s): AMMONIA in the last 8760 hours. CBC:  Recent Labs  10/01/15 1040  10/03/15 0415  10/05/15 0522  10/06/15 0548 10/07/15 10/07/15 0549  WBC 7.7  < > 6.6  < > 4.9  < > 6.2 4.5 4.5  NEUTROABS 5.7  --  4.6  --   --   --   --   --   --   HGB 12.6  < > 9.7*  < > 9.8*  < > 10.3* 9.9* 9.9*  HCT 36.0  < > 29.2*  < > 28.6*  < > 30.2* 29* 29.1*  MCV 84.7  < > 88.2  < > 85.6  --  85.1  --  84.3  PLT 175  < > 135*  < > 140*  < > 176 200 200  < > = values in this interval not displayed. Lipid  Recent Labs  05/30/15 10/04/15 0405  CHOL 113 74  HDL 43 31*  LDLCALC 57 28  TRIG 63 74    Cardiac Enzymes: No results for input(s): CKTOTAL, CKMB, CKMBINDEX, TROPONINI in the last 8760 hours. BNP: No results for input(s): BNP in the last 8760 hours. No results found for: [facility] Lab Results  Component Value Date   HGBA1C 5.8 (H) 10/04/2015   Lab Results  Component Value Date   TSH 1.522 10/05/2015   Lab Results  Component Value Date   VITAMINB12 2,099 (H) 10/05/2015   Lab Results  Component Value Date   FOLATE 22.8 10/05/2015   Lab Results  Component Value Date   IRON 42 10/05/2015   TIBC 210 (L) 10/05/2015   FERRITIN 64 10/05/2015    Imaging and Procedures obtained prior to SNF admission: Ct Abdomen Pelvis W Contrast  Result Date: 10/01/2015 CLINICAL DATA:  Nursing  home patient with dementia, presenting with fecal emesis. Evaluate for bowel obstruction. EXAM: CT ABDOMEN AND PELVIS WITH CONTRAST TECHNIQUE: Multidetector CT imaging of the abdomen and pelvis was performed using the standard protocol following bolus administration of intravenous contrast. CONTRAST:  75mL ISOVUE-300 IOPAMIDOL (ISOVUE-300) INJECTION 61% COMPARISON:  None. FINDINGS: Lower chest: The heart is enlarged. There is atherosclerosis of the coronary arteries. No significant pleural or pericardial effusion. No confluent basilar airspace disease. Hepatobiliary: No focal hepatic abnormalities are identified. There is no significant biliary dilatation status post cholecystectomy. Pancreas: Diffusely atrophied. No evidence of pancreatic mass, ductal dilatation or surrounding inflammation. Spleen: Normal in size without focal abnormality. Adrenals/Urinary Tract: Both adrenal glands appear normal. Both kidneys demonstrate cortical thinning. There are cysts in the lower pole of the right kidney, measuring up to 3.2 cm in diameter. No evidence  of renal mass, urinary tract calculus or hydronephrosis. The bladder appears unremarkable for its degree of distention. Stomach/Bowel: Small hiatal hernia. The stomach, proximal and mid small bowel are mildly dilated with multiple air-fluid levels. The distal small bowel is decompressed. The transition point is suspected to be in the false pelvis (axial image 56). No surrounding soft tissue mass or fluid collection is apparent in this area. The colon is decompressed. No apparent fistula or focal extraluminal fluid collection. There is a small amount of free pelvic fluid. Vascular/Lymphatic: There are no enlarged abdominal or pelvic lymph nodes. Patient has an aorto bi-iliac stent graft which appears patent. 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 No evidence of endograft leak. Both internal iliac arteries appear thrombosed  status post probable embolization on the right. The external iliac arteries are patent. IVC filter noted with mild strut penetration. Reproductive: Hysterectomy.  No evidence of adnexal mass. Other: There is a small amount of ascites. There is generalized edema throughout the mesenteric and subcutaneous fat. No focal extraluminal fluid collections are seen. Musculoskeletal: No acute or significant osseous findings. The bones are demineralized. There is a possible sacral decubitus ulcer. No evidence of bone destruction. Lower lumbar spondylosis noted. IMPRESSION: 1. Findings are consistent with a mid to distal small bowel obstruction. Transition point appears to be in the false pelvis without discernible cause, suggesting adhesions. 2. No fistula or abscess identified. 3. Generalized soft tissue edema with mild pelvic ascites. 4. Patent aortic stent graft. Electronically Signed   By: Carey Bullocks M.D.   On: 10/01/2015 13:50   Dg Chest Portable 1 View  Result Date: 10/01/2015 CLINICAL DATA:  Nasogastric tube placement. Decreased oxygen saturation. EXAM: PORTABLE CHEST 1 VIEW COMPARISON:  None. FINDINGS: Nasogastric tube tip and side port are below the diaphragm. No pneumothorax. There is fine nodular interstitial disease throughout the lungs with a mid to lower lung lung zone predominance. There is no airspace consolidation or volume loss. The heart size and pulmonary vascular normal. No adenopathy. There is atherosclerotic calcification in the aorta. There is degenerative change in each shoulder. IMPRESSION: Nasogastric tube tip and side port below the diaphragm. Fine nodular interstitial disease throughout the lungs of uncertain etiology or chronicity. No frank edema or consolidation. Cardiac silhouette within normal limits. There is aortic atherosclerosis. Electronically Signed   By: Bretta Bang III M.D.   On: 10/01/2015 14:21   Dg Abd Portable 1v-small Bowel Obstruction Protocol-initial, 8 Hr  Delay  Result Date: 10/02/2015 CLINICAL DATA:  Small bowel obstruction.  8 hour post contrast film. EXAM: PORTABLE ABDOMEN - 1 VIEW COMPARISON:  10/01/2015 FINDINGS: Enteric tube tip is in the right upper quadrant, likely in the distal stomach or duodenum bulb region. Gaseous distention of the stomach. Contrast material is demonstrated throughout the colon suggesting no evidence of complete small bowel obstruction. Vascular stents and coils. Inferior vena caval filter. Surgical clips in the right abdomen. IMPRESSION: Contrast material is present in the colon suggesting no evidence of complete small bowel obstruction. Electronically Signed   By: Burman Nieves M.D.   On: 10/02/2015 04:40   Dg Abd Portable 1v-small Bowel Protocol-position Verification  Result Date: 10/01/2015 CLINICAL DATA:  NG tube placement. EXAM: PORTABLE ABDOMEN - 1 VIEW COMPARISON:  CT 10/01/2015 FINDINGS: NG tube is in place with the tip in the perihilar region, possibly distal stomach or proximal duodenum. Side port is in the distal stomach. Decreasing small bowel dilatation.  Prior cholecystectomy. No free air. IMPRESSION: NG tube tip in the peripyloric region, possibly distal stomach or proximal duodenum. Decreasing small bowel dilatation. Electronically Signed   By: Charlett Nose M.D.   On: 10/01/2015 16:21     Not all labs, radiology exams or other studies done during hospitalization come through on my EPIC note; however they are reviewed by me.    Assessment and Plan  SBO (small bowel obstruction) Treated with conservative measures;resolved  Atrial fibrillation with rapid ventricular response (HCC) CHA2DS2 VASc score is 8. Tachycardia to the 130s-150s but then down to 50s and 60s.Continue diltiazem;Metoprolol increased to 100mg  po BID; TSH 2.030 wnl; discharge on lovenox after discussion with me;  SNF - will cont diltiazem and lopressor; plan to leave pt on lovenoxm swithc pt's dilantin to keppra, then start  eliquis as prophylaxis;plan for possible Holter as outpatient  Acute respiratory failure with hypoxia (HCC) 2/2 acute on chronic CHF;improved as heart failure improved  Acute on chronic diastolic heart failure (HCC) 2/2 to AF with RVR; tx with IV, then po lasix SNF - diuresis not complete ;cont lasix 40 mg daily;pt's dry weight is 146 pound  TIA (transient ischemic attack) with transient left sided numbness and worsening slurred speech in setting of a-fib.  - MRI/MRA brain: No evidence of acute stroke - UA neg - LDL 28 - A1c 5.8 - Carotid duplex 1-39 percent bilaterally, antegrade vertebral artery flow  - ECHO: EF 40-45 percent, moderate to severe mitral valve regurgitation, thin mobile density in the right atrium that could represent Chiari, but could not exclude intraatrial connection. No obvious thrombus within atrial septal aneurysm.  SNF - pt d/c on lovenox; plan to start eliquis one seizure meds changed  Seizures (HCC) SNF - None in a long while ;pt has been on dilantin which interacts with eliquis;plan is to change dilantin to keppra so pt can be put on eliquis  Dementia with behavioral disturbance SNF - chronic, stable; cont aricept  Thrombocytopenia (HCC) SNF - will f/u CBC  Depression Not stated as uncontrolled;cont cymbalta 60 mg daily  Hyperlipidemia SNF - LDL 28, HDL 31; zocor was changed to lipitor 2/2 drug-drug interaction; start lipitor 20 mg daily (lipitor not on d/c list but meant to be there)  GERD (gastroesophageal reflux disease) SNF - cont omeprazole 40 mg BID  Time spent > 45 min;> 50% of time with patient was spent reviewing records, labs, tests and studies, counseling and developing plan of care  Thurston Hole D. Lyn Hollingshead, MD

## 2015-10-13 DIAGNOSIS — R4781 Slurred speech: Secondary | ICD-10-CM

## 2015-10-16 LAB — BASIC METABOLIC PANEL
BUN: 12 mg/dL (ref 4–21)
Creatinine: 0.9 mg/dL (ref 0.5–1.1)
GLUCOSE: 97 mg/dL
POTASSIUM: 4.5 mmol/L (ref 3.4–5.3)
SODIUM: 138 mmol/L (ref 137–147)

## 2015-10-16 LAB — CBC AND DIFFERENTIAL
HEMATOCRIT: 29 % — AB (ref 36–46)
HEMOGLOBIN: 9.8 g/dL — AB (ref 12.0–16.0)
Platelets: 275 10*3/uL (ref 150–399)
WBC: 4.6 10^3/mL

## 2015-10-17 ENCOUNTER — Encounter: Payer: Self-pay | Admitting: Internal Medicine

## 2015-10-17 DIAGNOSIS — J9601 Acute respiratory failure with hypoxia: Secondary | ICD-10-CM | POA: Insufficient documentation

## 2015-10-17 NOTE — Assessment & Plan Note (Signed)
CHA2DS2 VASc score is 8. Tachycardia to the 130s-150s but then down to 50s and 60s.Continue diltiazem;Metoprolol increased to 100mg  po BID; TSH 2.030 wnl; discharge on lovenox after discussion with me;  SNF - will cont diltiazem and lopressor; plan to leave pt on lovenoxm swithc pt's dilantin to keppra, then start eliquis as prophylaxis;plan for possible Holter as outpatient

## 2015-10-17 NOTE — Assessment & Plan Note (Signed)
SNF - chronic, stable; cont aricept

## 2015-10-17 NOTE — Assessment & Plan Note (Signed)
2/2 to AF with RVR; tx with IV, then po lasix SNF - diuresis not complete ;cont lasix 40 mg daily;pt's dry weight is 146 pound

## 2015-10-17 NOTE — Assessment & Plan Note (Signed)
with transient left sided numbness and worsening slurred speech in setting of a-fib.  - MRI/MRA brain: No evidence of acute stroke - UA neg - LDL 28 - A1c 5.8 - Carotid duplex 1-39 percent bilaterally, antegrade vertebral artery flow  - ECHO: EF 40-45 percent, moderate to severe mitral valve regurgitation, thin mobile density in the right atrium that could represent Chiari, but could not exclude intraatrial connection. No obvious thrombus within atrial septal aneurysm.  SNF - pt d/c on lovenox; plan to start eliquis one seizure meds changed

## 2015-10-17 NOTE — Assessment & Plan Note (Signed)
SNF - None in a long while ;pt has been on dilantin which interacts with eliquis;plan is to change dilantin to keppra so pt can be put on eliquis

## 2015-10-17 NOTE — Assessment & Plan Note (Signed)
Treated with conservative measures;resolved

## 2015-10-17 NOTE — Assessment & Plan Note (Signed)
Not stated as uncontrolled;cont cymbalta 60 mg daily

## 2015-10-17 NOTE — Assessment & Plan Note (Signed)
SNF - cont omeprazole 40 mg BID

## 2015-10-17 NOTE — Assessment & Plan Note (Signed)
SNF - will f/u CBC

## 2015-10-17 NOTE — Assessment & Plan Note (Signed)
2/2 acute on chronic CHF;improved as heart failure improved

## 2015-10-17 NOTE — Assessment & Plan Note (Signed)
SNF - LDL 28, HDL 31; zocor was changed to lipitor 2/2 drug-drug interaction; start lipitor 20 mg daily (lipitor not on d/c list but meant to be there)

## 2015-10-28 ENCOUNTER — Encounter (HOSPITAL_COMMUNITY): Payer: Self-pay

## 2015-10-28 ENCOUNTER — Emergency Department (HOSPITAL_COMMUNITY): Payer: Medicare Other

## 2015-10-28 ENCOUNTER — Inpatient Hospital Stay (HOSPITAL_COMMUNITY)
Admission: EM | Admit: 2015-10-28 | Discharge: 2015-10-31 | DRG: 389 | Disposition: A | Discharge status: Skilled Nursing Facility | Payer: Medicare Other | Attending: Internal Medicine | Admitting: Internal Medicine

## 2015-10-28 DIAGNOSIS — Z86718 Personal history of other venous thrombosis and embolism: Secondary | ICD-10-CM

## 2015-10-28 DIAGNOSIS — K219 Gastro-esophageal reflux disease without esophagitis: Secondary | ICD-10-CM | POA: Diagnosis not present

## 2015-10-28 DIAGNOSIS — F329 Major depressive disorder, single episode, unspecified: Secondary | ICD-10-CM | POA: Diagnosis present

## 2015-10-28 DIAGNOSIS — F0391 Unspecified dementia with behavioral disturbance: Secondary | ICD-10-CM | POA: Diagnosis present

## 2015-10-28 DIAGNOSIS — Z91011 Allergy to milk products: Secondary | ICD-10-CM

## 2015-10-28 DIAGNOSIS — I2510 Atherosclerotic heart disease of native coronary artery without angina pectoris: Secondary | ICD-10-CM | POA: Diagnosis present

## 2015-10-28 DIAGNOSIS — Z7901 Long term (current) use of anticoagulants: Secondary | ICD-10-CM | POA: Diagnosis not present

## 2015-10-28 DIAGNOSIS — R1111 Vomiting without nausea: Secondary | ICD-10-CM | POA: Diagnosis present

## 2015-10-28 DIAGNOSIS — I481 Persistent atrial fibrillation: Secondary | ICD-10-CM | POA: Diagnosis present

## 2015-10-28 DIAGNOSIS — G40909 Epilepsy, unspecified, not intractable, without status epilepticus: Secondary | ICD-10-CM | POA: Diagnosis present

## 2015-10-28 DIAGNOSIS — Z8673 Personal history of transient ischemic attack (TIA), and cerebral infarction without residual deficits: Secondary | ICD-10-CM | POA: Diagnosis not present

## 2015-10-28 DIAGNOSIS — Z880 Allergy status to penicillin: Secondary | ICD-10-CM

## 2015-10-28 DIAGNOSIS — Z8249 Family history of ischemic heart disease and other diseases of the circulatory system: Secondary | ICD-10-CM | POA: Diagnosis not present

## 2015-10-28 DIAGNOSIS — I110 Hypertensive heart disease with heart failure: Secondary | ICD-10-CM | POA: Diagnosis present

## 2015-10-28 DIAGNOSIS — R112 Nausea with vomiting, unspecified: Secondary | ICD-10-CM | POA: Diagnosis not present

## 2015-10-28 DIAGNOSIS — K56609 Unspecified intestinal obstruction, unspecified as to partial versus complete obstruction: Secondary | ICD-10-CM | POA: Diagnosis not present

## 2015-10-28 DIAGNOSIS — K56600 Partial intestinal obstruction, unspecified as to cause: Principal | ICD-10-CM | POA: Diagnosis present

## 2015-10-28 DIAGNOSIS — Z86711 Personal history of pulmonary embolism: Secondary | ICD-10-CM | POA: Diagnosis not present

## 2015-10-28 DIAGNOSIS — I428 Other cardiomyopathies: Secondary | ICD-10-CM | POA: Diagnosis not present

## 2015-10-28 DIAGNOSIS — F0151 Vascular dementia with behavioral disturbance: Secondary | ICD-10-CM | POA: Diagnosis not present

## 2015-10-28 DIAGNOSIS — D649 Anemia, unspecified: Secondary | ICD-10-CM | POA: Diagnosis not present

## 2015-10-28 DIAGNOSIS — I509 Heart failure, unspecified: Secondary | ICD-10-CM

## 2015-10-28 DIAGNOSIS — Z79899 Other long term (current) drug therapy: Secondary | ICD-10-CM | POA: Diagnosis not present

## 2015-10-28 DIAGNOSIS — Z66 Do not resuscitate: Secondary | ICD-10-CM | POA: Diagnosis present

## 2015-10-28 DIAGNOSIS — I429 Cardiomyopathy, unspecified: Secondary | ICD-10-CM | POA: Diagnosis present

## 2015-10-28 DIAGNOSIS — E785 Hyperlipidemia, unspecified: Secondary | ICD-10-CM | POA: Diagnosis present

## 2015-10-28 DIAGNOSIS — Z91018 Allergy to other foods: Secondary | ICD-10-CM

## 2015-10-28 DIAGNOSIS — F03918 Unspecified dementia, unspecified severity, with other behavioral disturbance: Secondary | ICD-10-CM | POA: Diagnosis present

## 2015-10-28 DIAGNOSIS — I272 Pulmonary hypertension, unspecified: Secondary | ICD-10-CM | POA: Diagnosis present

## 2015-10-28 DIAGNOSIS — I4891 Unspecified atrial fibrillation: Secondary | ICD-10-CM | POA: Diagnosis not present

## 2015-10-28 DIAGNOSIS — G4733 Obstructive sleep apnea (adult) (pediatric): Secondary | ICD-10-CM | POA: Diagnosis present

## 2015-10-28 DIAGNOSIS — I5022 Chronic systolic (congestive) heart failure: Secondary | ICD-10-CM | POA: Diagnosis not present

## 2015-10-28 DIAGNOSIS — I5032 Chronic diastolic (congestive) heart failure: Secondary | ICD-10-CM | POA: Diagnosis present

## 2015-10-28 DIAGNOSIS — R569 Unspecified convulsions: Secondary | ICD-10-CM | POA: Diagnosis not present

## 2015-10-28 DIAGNOSIS — F32A Depression, unspecified: Secondary | ICD-10-CM | POA: Diagnosis present

## 2015-10-28 HISTORY — DX: Other persistent atrial fibrillation: I48.19

## 2015-10-28 LAB — CBC AND DIFFERENTIAL
HCT: 33 % — AB (ref 36–46)
HEMOGLOBIN: 11.6 g/dL — AB (ref 12.0–16.0)
PLATELETS: 181 10*3/uL (ref 150–399)
WBC: 7.5 10^3/mL

## 2015-10-28 LAB — BASIC METABOLIC PANEL
BUN: 17 mg/dL (ref 4–21)
CREATININE: 1 mg/dL (ref 0.5–1.1)
Glucose: 195 mg/dL
POTASSIUM: 3.5 mmol/L (ref 3.4–5.3)
Sodium: 140 mmol/L (ref 137–147)

## 2015-10-28 LAB — PROTIME-INR
INR: 1.21
Prothrombin Time: 15.4 seconds — ABNORMAL HIGH (ref 11.4–15.2)

## 2015-10-28 LAB — HEPATIC FUNCTION PANEL: BILIRUBIN, TOTAL: 0.9 mg/dL

## 2015-10-28 LAB — CBC
HEMATOCRIT: 32.7 % — AB (ref 36.0–46.0)
HEMOGLOBIN: 11.6 g/dL — AB (ref 12.0–15.0)
MCH: 29.7 pg (ref 26.0–34.0)
MCHC: 35.5 g/dL (ref 30.0–36.0)
MCV: 83.6 fL (ref 78.0–100.0)
Platelets: 181 10*3/uL (ref 150–400)
RBC: 3.91 MIL/uL (ref 3.87–5.11)
RDW: 13.7 % (ref 11.5–15.5)
WBC: 7.5 10*3/uL (ref 4.0–10.5)

## 2015-10-28 LAB — COMPREHENSIVE METABOLIC PANEL
ALT: 13 U/L — ABNORMAL LOW (ref 14–54)
ANION GAP: 9 (ref 5–15)
AST: 17 U/L (ref 15–41)
Albumin: 3.5 g/dL (ref 3.5–5.0)
Alkaline Phosphatase: 110 U/L (ref 38–126)
BUN: 17 mg/dL (ref 6–20)
CHLORIDE: 105 mmol/L (ref 101–111)
CO2: 26 mmol/L (ref 22–32)
Calcium: 9.5 mg/dL (ref 8.9–10.3)
Creatinine, Ser: 0.97 mg/dL (ref 0.44–1.00)
GFR calc Af Amer: 60 mL/min — ABNORMAL LOW (ref >60)
GFR, EST NON AFRICAN AMERICAN: 51 mL/min — AB (ref >60)
Glucose, Bld: 195 mg/dL — ABNORMAL HIGH (ref 65–99)
POTASSIUM: 3.5 mmol/L (ref 3.5–5.1)
SODIUM: 140 mmol/L (ref 135–145)
TOTAL PROTEIN: 7.1 g/dL (ref 6.5–8.1)
Total Bilirubin: 0.9 mg/dL (ref 0.3–1.2)

## 2015-10-28 LAB — MRSA PCR SCREENING: MRSA BY PCR: NEGATIVE

## 2015-10-28 LAB — MAGNESIUM: MAGNESIUM: 1.5 mg/dL — AB (ref 1.7–2.4)

## 2015-10-28 LAB — LIPASE, BLOOD: Lipase: 23 U/L (ref 11–51)

## 2015-10-28 LAB — TROPONIN I

## 2015-10-28 MED ORDER — ONDANSETRON HCL 4 MG/2ML IJ SOLN
4.0000 mg | Freq: Once | INTRAMUSCULAR | Status: AC | PRN
  Administered 2015-10-28: 4 mg via INTRAVENOUS
  Filled 2015-10-28: qty 2

## 2015-10-28 MED ORDER — DILTIAZEM HCL-DEXTROSE 100-5 MG/100ML-% IV SOLN (PREMIX)
5.0000 mg/h | INTRAVENOUS | Status: DC
  Administered 2015-10-28: 15 mg/h via INTRAVENOUS
  Administered 2015-10-28: 5 mg/h via INTRAVENOUS
  Administered 2015-10-28: 10 mg/h via INTRAVENOUS
  Filled 2015-10-28 (×4): qty 100

## 2015-10-28 MED ORDER — SODIUM CHLORIDE 0.9 % IV BOLUS (SEPSIS)
500.0000 mL | Freq: Once | INTRAVENOUS | Status: AC
  Administered 2015-10-28: 500 mL via INTRAVENOUS

## 2015-10-28 MED ORDER — SODIUM CHLORIDE 0.9 % IV SOLN
500.0000 mg | Freq: Two times a day (BID) | INTRAVENOUS | Status: DC
  Administered 2015-10-28 – 2015-10-30 (×5): 500 mg via INTRAVENOUS
  Filled 2015-10-28 (×6): qty 5

## 2015-10-28 MED ORDER — POTASSIUM CHLORIDE IN NACL 40-0.9 MEQ/L-% IV SOLN
INTRAVENOUS | Status: DC
  Administered 2015-10-28 – 2015-10-29 (×2): 75 mL/h via INTRAVENOUS
  Filled 2015-10-28 (×6): qty 1000

## 2015-10-28 MED ORDER — ENOXAPARIN SODIUM 100 MG/ML ~~LOC~~ SOLN
1.5000 mg/kg | SUBCUTANEOUS | Status: DC
  Administered 2015-10-28: 100 mg via SUBCUTANEOUS
  Filled 2015-10-28 (×2): qty 1

## 2015-10-28 MED ORDER — ONDANSETRON HCL 4 MG/2ML IJ SOLN: 4.0000 mg | Freq: Four times a day (QID) | INTRAMUSCULAR | Status: DC | PRN

## 2015-10-28 MED ORDER — DILTIAZEM LOAD VIA INFUSION
10.0000 mg | Freq: Once | INTRAVENOUS | Status: AC
  Administered 2015-10-28: 10 mg via INTRAVENOUS
  Filled 2015-10-28: qty 10

## 2015-10-28 MED ORDER — DIGOXIN 0.25 MG/ML IJ SOLN
0.5000 mg | Freq: Once | INTRAMUSCULAR | Status: AC
  Administered 2015-10-28: 0.5 mg via INTRAVENOUS
  Filled 2015-10-28: qty 2

## 2015-10-28 MED ORDER — ACETAMINOPHEN 325 MG PO TABS: 650.0000 mg | ORAL_TABLET | ORAL | Status: DC | PRN

## 2015-10-28 MED ORDER — ATORVASTATIN CALCIUM 20 MG PO TABS
20.0000 mg | ORAL_TABLET | Freq: Every day | ORAL | Status: DC
  Administered 2015-10-29 – 2015-10-31 (×3): 20 mg via ORAL
  Filled 2015-10-28: qty 2
  Filled 2015-10-28 (×2): qty 1

## 2015-10-28 NOTE — ED Triage Notes (Signed)
Vomited x 1 from [facility] no fever no abdominal pain no urinary sx vomited greenish secretions.

## 2015-10-28 NOTE — ED Notes (Signed)
Bed: KC12 Expected date:  Expected time:  Means of arrival:  Comments: 82 yo F/ vomiting

## 2015-10-28 NOTE — ED Notes (Signed)
No respiratory or acute distress noted resting in bed with eyes closed family at bedside call light in reach. 

## 2015-10-28 NOTE — H&P (Signed)
TRH H&P   Patient Demographics:    Brandi Heath, is a 82 y.o. female  MRN: [id number]   DOB - 1927-02-15  Admit Date - 10/28/2015  Outpatient Primary MD for the patient is Merrilee Seashore, MD  Referring MD/NP/PA: dr Rhunette Croft  Patient coming from: SN[facility]  Chief Complaint  Patient presents with  . Emesis      HPI:    Brandi Heath  is a 82 y.o. female, with a past medical history of dementia, previous episodes of small bowel obstruction, atrial fibrillation on anticoagulation, history of PE and DVT, history of stroke, history of seizure disorder, who presents with complaints of nausea and vomiting overnight, patient reports overall 6 episodes, she is poor historian, daughter at bedside gives most of the history, agent with recent hospitalization 3 weeks ago for SBO which resolved with conservative management, patient had green bilious vomiting, 6, poor oral intake, no coffee-ground emesis, no bright red blood per rectum, no fever, no chills, NAD patient had good bowel movement, passing flatness, no further vomiting, reports abdominal pain has resolved, and she is feeling much better, KUB showing possible recurrent SBO. As well patient was noticed to be in A. fib with RVR, requiring Cardizem drip, agent admitted to step down.    Review of systems:    In addition to the HPI above, No Fever-chills, No Headache, No changes with Vision or hearing, No problems swallowing food or Liquids, No Chest pain, Cough or Shortness of Breath, Reports abdominal pain, nausea and vomiting . No Blood in stool or Urine, No dysuria, No new skin rashes or bruises, No new joints pains-aches,  No new weakness, tingling, numbness in any extremity, report generalized weakness. No recent weight gain or loss, No polyuria, polydypsia or polyphagia, No significant Mental Stressors.  A full 10  point Review of Systems was done, except as stated above, all other Review of Systems were negative.   With Past History of the following :    Past Medical History:  Diagnosis Date  . Atrial fibrillation (HCC)   . CHF (congestive heart failure) (HCC)   . Clotting disorder (HCC)   . Coronary artery disease   . Dementia without behavioral disturbance   . DVT (deep venous thrombosis) (HCC)   . Hyperlipidemia   . Hypertension   . OSA (obstructive sleep apnea)   . PE (pulmonary embolism)   . Polyneuropathy (HCC)   . SBO (small bowel obstruction)   . Seizures (HCC)   . Stroke [facility])       Past Surgical History:  Procedure Laterality Date  . ABDOMINAL AORTIC ANEURYSM REPAIR    . ABDOMINAL HYSTERECTOMY    . CHOLECYSTECTOMY    . SPINAL FUSION  2003      Social History:     Social History  Substance Use Topics  . Smoking status: Never Smoker  . Smokeless tobacco: Never  Used  . Alcohol use No     Lives - At SNF  Mobility - wheelchair dependent, occasionally with a walker for short distance as per daughter    Family History :     Family History  Problem Relation Age of Onset  . Hypertension Mother   . Heart disease Mother   . Heart disease Father   . Cancer Sister     breast  . Diabetes Sister   . Heart disease Son   . Hypertension Son   . Thyroid disease Sister      Home Medications:   Prior to Admission medications   Medication Sig Start Date End Date Taking? Authorizing Provider  acetaminophen (TYLENOL) 325 MG tablet Take 650 mg by mouth every 4 (four) hours as needed (Pain). pain    Yes Historical Provider, MD  apixaban (ELIQUIS) 2.5 MG TABS tablet Take 2.5 mg by mouth 2 (two) times daily.   Yes Historical Provider, MD  atorvastatin (LIPITOR) 20 MG tablet Take 20 mg by mouth daily.   Yes Historical Provider, MD  Calcium Carbonate-Vitamin D (CALCIUM-VITAMIN D) 500-200 MG-UNIT tablet Take 1 tablet by mouth daily.    Yes Historical Provider, MD    Dextromethorphan-Guaifenesin (GUAIFENESIN DM PO) Take 10 mLs by mouth every 4 (four) hours as needed (Cough).    Yes Historical Provider, MD  diltiazem (TIAZAC) 360 MG 24 hr capsule Take 360 mg by mouth daily.   Yes Historical Provider, MD  donepezil (ARICEPT) 10 MG tablet Take 10 mg by mouth at bedtime.    Yes Historical Provider, MD  DULoxetine (CYMBALTA) 60 MG capsule Take 60 mg by mouth at bedtime.    Yes Historical Provider, MD  furosemide (LASIX) 40 MG tablet Take 40 mg by mouth daily.   Yes Historical Provider, MD  Iron-FA-B Cmp-C-Biot-Probiotic (FUSION PLUS) CAPS Take 1 capsule by mouth daily.   Yes Historical Provider, MD  levETIRAcetam (KEPPRA) 500 MG tablet Take 500 mg by mouth 2 (two) times daily.   Yes Historical Provider, MD  loperamide (IMODIUM) 2 MG capsule Take 4 mg by mouth 4 (four) times daily as needed for diarrhea or loose stools.   Yes Historical Provider, MD  Magnesium Oxide -Mg Supplement 400 MG CAPS Take 1 tablet by mouth daily.   Yes Historical Provider, MD  Menthol-Methyl Salicylate (MUSCLE RUB) 10-15 % CREA Apply 1 application topically 3 (three) times daily as needed for muscle pain.   Yes Historical Provider, MD  metoprolol (LOPRESSOR) 100 MG tablet Take 1 tablet (100 mg total) by mouth 2 (two) times daily. 10/08/15  Yes Renae Fickle, MD  Multiple Vitamins-Minerals (MULTIVITAMIN WITH MINERALS) tablet Take 1 tablet by mouth daily.   Yes Historical Provider, MD  Nutritional Supplements (ENSURE ENLIVE PO) Take 1 Can by mouth daily before breakfast.   Yes Historical Provider, MD  omeprazole (PRILOSEC) 40 MG capsule Take 40 mg by mouth 2 (two) times daily.   Yes Historical Provider, MD  polyethylene glycol (MIRALAX / GLYCOLAX) packet Take 17 g by mouth daily. Hold for more than 3 bms in a day   Yes Historical Provider, MD  potassium chloride SA (K-DUR,KLOR-CON) 20 MEQ tablet Take 1 tablet (20 mEq total) by mouth daily. 10/08/15  Yes Renae Fickle, MD  promethazine  (PHENERGAN) 25 MG suppository Place 25 mg rectally every 12 (twelve) hours as needed for nausea or vomiting.   Yes Historical Provider, MD  risperiDONE (RISPERDAL) 1 MG tablet Take 1 mg by mouth 2 (two) times  daily.   Yes Historical Provider, MD  travoprost, benzalkonium, (TRAVATAN) 0.004 % ophthalmic solution Place 1 drop into both eyes at bedtime.   Yes Historical Provider, MD  enoxaparin (LOVENOX) 100 MG/ML injection Inject 1 mL (100 mg total) into the skin daily. Patient not taking: Reported on 10/28/2015 10/08/15   Renae Fickle, MD     Allergies:     Allergies  Allergen Reactions  . Chocolate Diarrhea  . Lactose Intolerance (Gi)   . Penicillins     From [employer]     Physical Exam:   Vitals  Blood pressure 114/100, pulse (!) 136, temperature 98.6 F (37 C), temperature source Oral, resp. rate 16, height 5\' 5"  (1.651 m), weight 70.3 kg (155 lb), SpO2 94 %.   1. General Frail, elderly female lying in bed in NAD,   2. Awake, alert, pleasant, communicative  3. No F.N deficits, ALL C.Nerves Intact, overall generalized weakness, Plantars down going.  4. Ears and Eyes appear Normal, Conjunctivae clear, PERRLA. Dry Oral Mucosa.  5. Supple Neck, No JVD, No cervical lymphadenopathy appriciated, No Carotid Bruits.  6. Symmetrical Chest wall movement, Good air movement bilaterally, CTAB.  7. Irregular irregular, tachycardic, No Gallops, Rubs or Murmurs, No Parasternal Heave.  8. Increased Bowel Sounds, Abdomen Soft, No tenderness, No organomegaly appriciated,No rebound -guarding or rigidity.  9.  No Cyanosis, Normal Skin Turgor, No Skin Rash or Bruise.  10. Good muscle tone,  joints appear normal , no effusions, right lower extremity edema +2  11. No Palpable Lymph Nodes in Neck or Axillae     Data Review:    CBC  Recent Labs Lab 10/28/15 0440  WBC 7.5  HGB 11.6*  HCT 32.7*  PLT 181  MCV 83.6  MCH 29.7  MCHC 35.5  RDW 13.7    ------------------------------------------------------------------------------------------------------------------  Chemistries   Recent Labs Lab 10/28/15 0440  NA 140  K 3.5  CL 105  CO2 26  GLUCOSE 195*  BUN 17  CREATININE 0.97  CALCIUM 9.5  AST 17  ALT 13*  ALKPHOS 110  BILITOT 0.9   ------------------------------------------------------------------------------------------------------------------ estimated creatinine clearance is 40.9 mL/min (by C-G formula based on SCr of 0.97 mg/dL). ------------------------------------------------------------------------------------------------------------------ No results for input(s): TSH, T4TOTAL, T3FREE, THYROIDAB in the last 72 hours.  Invalid input(s): FREET3  Coagulation profile No results for input(s): INR, PROTIME in the last 168 hours. ------------------------------------------------------------------------------------------------------------------- No results for input(s): DDIMER in the last 72 hours. -------------------------------------------------------------------------------------------------------------------  Cardiac Enzymes No results for input(s): CKMB, TROPONINI, MYOGLOBIN in the last 168 hours.  Invalid input(s): CK ------------------------------------------------------------------------------------------------------------------ No results found for: BNP   ---------------------------------------------------------------------------------------------------------------  Urinalysis    Component Value Date/Time   COLORURINE YELLOW 10/01/2015 1022   APPEARANCEUR CLEAR 10/01/2015 1022   LABSPEC 1.041 (H) 10/01/2015 1022   PHURINE 7.5 10/01/2015 1022   GLUCOSEU NEGATIVE 10/01/2015 1022   HGBUR NEGATIVE 10/01/2015 1022   BILIRUBINUR SMALL (A) 10/01/2015 1022   KETONESUR NEGATIVE 10/01/2015 1022   PROTEINUR NEGATIVE 10/01/2015 1022   NITRITE NEGATIVE 10/01/2015 1022   LEUKOCYTESUR NEGATIVE 10/01/2015 1022     ----------------------------------------------------------------------------------------------------------------   Imaging Results:    Dg Abd Acute W/chest  Result Date: 10/28/2015 CLINICAL DATA:  Emesis. Recent small bowel obstruction. Evaluate for pneumonia. EXAM: DG ABDOMEN ACUTE W/ 1V CHEST COMPARISON:  Chest radiograph 10/07/2015. CT abdomen/ pelvis 10/01/2015 FINDINGS: Stable cardiomegaly. Stable interstitial and reticular prominence. Reticulonodular opacities at the lung bases, may be calcified, unchanged. No new focal airspace disease. No evidence of free intra-abdominal air. Dilated  small bowel loops in the central abdomen measures up to 3.8 cm. Multiple serpiginous and irregular linear opacities projecting over the upper abdomen are likely external to the patient. There are cholecystectomy clips. Post aorta bi-iliac stenting. Unchanged osseous structures. IMPRESSION: 1. Air-filled dilated loops of small bowel in the central abdomen, similar to prior exam. Recurrent small bowel obstruction not excluded. 2. Suspected overlying artifact partially obscuring evaluation of the abdomen. 3. Chronic changes in the lungs with lower lobe predominant reticulonodular opacities that may be calcified. No confluent consolidation to suggest pneumonia. Electronically Signed   By: Rubye Oaks M.D.   On: 10/28/2015 06:50    My personal review of EKG: Rhythm NSR, Rate  142 /min, QTc 474, no Acute ST changes   Assessment & Plan:    Active Problems:   Dementia with behavioral disturbance   Seizures (HCC)   Personal history of PE (pulmonary embolism)   Depression   History of CVA (cerebrovascular accident)   Personal history of DVT (deep vein thrombosis)   GERD (gastroesophageal reflux disease)   Chronic CHF (congestive heart failure) (HCC)   SBO (small bowel obstruction)   Atrial fibrillation with RVR (HCC)   SBO - Patient presents with nausea vomiting and abdominal pain, KUB significant  for possible recurrent SBO , no further nausea vomiting or abdominal pain since admission, she had good bowel movement, ED discussed with general surgery Dr. Carolynne Edouard, ports given patient improvement symptoms, no need for NG tube, nothing by mouth for next 24 hours, repeat KUB in a.m., if no further nausea or vomiting will advance diet gradually ., No need for official surgical consult currently per Dr. Carolynne Edouard . - Continue with IV fluids, nothing by mouth, KUB in a.m.   A. fib with RVR - Most likely to volume depletion, and not taking meds secondary to nausea and vomiting, will continue to hold metoprolol and Cardizem as she is nothing by mouth, will continue with Cardizem drip, admit to stepdown, to tolerate oral will resume her meds and DC drip. - Patient is nothing by mouth, we'll hold lEliquis and will start on Lovenox treatment dose  History of seizures - Daughter reports he was changed from Dilantin to Keppra at facility - Tinea with Keppra IV till she is able to tolerate oral  History of DVT and PE in the past - she is on Eliquis, currently nothing by mouth, so continuous full dose Lovenox.  DVT Prophylaxis  SCDs(On full dose Lovenox for A. fib and DVT)  AM Labs Ordered, also please review Full Orders  Family Communication: Admission, patients condition and plan of care including tests being ordered have been discussed with the patient and Daughter who indicate understanding and agree with the plan and Code Status.  Code Status DNR confirmed by daughter  Likely DC to back to SNF  Condition GUARDED    Consults called: ED D/W GI Dr Toth(currently no need for official consult per general surgery)  Admission status: Inpatient  Time spent in minutes : 65 minutes   Keina Mutch M.D on 10/28/2015 at 9:21 AM  Between 7am to 7pm - Pager - [phone]. After 7pm go to [email or web address] - password [email or web address]  [facility] - Office  [phone]

## 2015-10-28 NOTE — Consult Note (Signed)
CARDIOLOGY CONSULT NOTE     Primary Care Physician: Merrilee Seashore, MD Referring Physician:  hospitalists  Admit Date: 10/28/2015  Reason for consultation:  afib with RVR  Brandi Heath is a 82 y.o. female with a h/o persistent atrial fibrillation.  She was recently admitted with very similar situation 10/05/15 (Dr Anne Fu notes is reviewed).  She has persistent afib for which she is rate controlled and anticoagulated.  She now returns with symptoms suggestive of SBO.  Cardiology is asked to assist with AF with RVR. She did have heart rates 140s earlier. With diltiazem drip and a single dose of digoxin, her V rates are currently well controlled.  Presently, she does not appear to be taking medicines by mouth.  She is currently sleeping.  Though she rouses, she is not interested in a historical discussion with me at this time.  Past Medical History:  Diagnosis Date  . CHF (congestive heart failure) (HCC)   . Clotting disorder (HCC)   . Coronary artery disease   . Dementia without behavioral disturbance   . DVT (deep venous thrombosis) (HCC)   . Hyperlipidemia   . Hypertension   . OSA (obstructive sleep apnea)   . PE (pulmonary embolism)   . Persistent atrial fibrillation (HCC)   . Polyneuropathy (HCC)   . SBO (small bowel obstruction)   . Seizures (HCC)   . Stroke [facility])    Past Surgical History:  Procedure Laterality Date  . ABDOMINAL AORTIC ANEURYSM REPAIR    . ABDOMINAL HYSTERECTOMY    . CHOLECYSTECTOMY    . SPINAL FUSION  2003    . atorvastatin  20 mg Oral Daily  . enoxaparin (LOVENOX) injection  1.5 mg/kg Subcutaneous Q24H  . levETIRAcetam  500 mg Intravenous Q12H   . 0.9 % NaCl with KCl 40 mEq / L 75 mL/hr (10/28/15 1323)  . diltiazem (CARDIZEM) infusion 15 mg/hr (10/28/15 1443)    Allergies  Allergen Reactions  . Chocolate Diarrhea  . Lactose Intolerance (Gi) Diarrhea  . Penicillins Other (See Comments)    Reaction:  Unknown  Has patient had a PCN reaction  causing immediate rash, facial/tongue/throat swelling, SOB or lightheadedness with hypotension: Unsure Has patient had a PCN reaction causing severe rash involving mucus membranes or skin necrosis: Unsure Has patient had a PCN reaction that required hospitalization:  Unsure  Has patient had a PCN reaction occurring within the last 10 years: Unsure If all of the above answers are "NO", then may proceed with Cephalosporin use.    Social History   Social History  . Marital status: Widowed    Spouse name: N/A  . Number of children: N/A  . Years of education: N/A   Occupational History  . Not on file.   Social History Main Topics  . Smoking status: Never Smoker  . Smokeless tobacco: Never Used  . Alcohol use No  . Drug use: Unknown  . Sexual activity: No   Other Topics Concern  . Not on file   Social History Narrative  . No narrative on file    Family History  Problem Relation Age of Onset  . Hypertension Mother   . Heart disease Mother   . Heart disease Father   . Cancer Sister     breast  . Diabetes Sister   . Heart disease Son   . Hypertension Son   . Thyroid disease Sister     ROS-unable to obtain  Physical Exam: Telemetry:  Afib,  V rates initially 140s,  now 2280s Vitals:   10/28/15 1130 10/28/15 1200 10/28/15 1300 10/28/15 1400  BP: 113/73 120/65 118/66 (!) 150/77  Pulse: 117 (!) 106 96   Resp:  20 16 14   Temp:  98.3 F (36.8 C)    TempSrc:  Oral    SpO2: 94% 99% 96%   Weight:  150 lb 2.1 oz (68.1 kg)    Height:  5\' 7"  (1.702 m)      GEN- The patient is elderly and frail appearing, sleeping but rouses  Head- normocephalic, atraumatic Eyes-  Sclera clear, conjunctiva pink Ears- hearing intact Oropharynx- clear Neck- supple,   Lungs- Clear to ausculation bilaterally, normal work of breathing Heart- irregular rate and rhythm  GI- distended and tender Extremities- no clubbing, cyanosis, or edema MS- no significant deformity or atrophy Skin- no rash  or lesion Psych- sleeping but rouses Neuro- sleeping but rouses  EKG: all ekgs in epic reveal afib, no ischemic changes today  Labs:   Lab Results  Component Value Date   WBC 7.5 10/28/2015   HGB 11.6 (L) 10/28/2015   HCT 32.7 (L) 10/28/2015   MCV 83.6 10/28/2015   PLT 181 10/28/2015    Recent Labs Lab 10/28/15 0440  NA 140  K 3.5  CL 105  CO2 26  BUN 17  CREATININE 0.97  CALCIUM 9.5  PROT 7.1  BILITOT 0.9  ALKPHOS 110  ALT 13*  AST 17  GLUCOSE 195*   No results found for: CKTOTAL, CKMB, CKMBINDEX, TROPONINI Lab Results  Component Value Date   CHOL 74 10/04/2015   CHOL 113 05/30/2015   Lab Results  Component Value Date   HDL 31 (L) 10/04/2015   HDL 43 05/30/2015   Lab Results  Component Value Date   LDLCALC 28 10/04/2015   LDLCALC 57 05/30/2015   Lab Results  Component Value Date   TRIG 74 10/04/2015   TRIG 63 05/30/2015   Lab Results  Component Value Date   CHOLHDL 2.4 10/04/2015   No results found for: LDLDIRECT     Echo 10/06/15: EF 40%, mild MR, moderate to severe MR, severe LA enlargement  ASSESSMENT AND PLAN:   1.  Persistent atrial fibrillation Though the duration of her afib is not clearly known to me, she was in afib on her hospitalization in September.  Her echo favors permanent afib.  I do not think that any attempts at cardioversion would be successful. Long term rate control is appropriate.  She was on eliquis prior to admission and is currently on full dose lovenox as per hospitalist team.  Would transition back to eliquis 2.5mg  BID when taking POs and otherwise stable.  Chads2vasc score is 8.  For now continue IV diltiazem drip Convert back to prior oral dosing of diltiazem CD 360mg  daily and metoprolol 100mg  BID  2. Nonischemic CM/ chronic systolic dysfunction Caution with IVF  3. SBO Per primary team  4. Dementia Stable No change required today  Conservative measures would seem appropriate.  She is appropriately  DNR. Cardiology to follow.  Hillis RangeJames Alonso Gapinski, MD 10/28/2015  4:17 PM

## 2015-10-28 NOTE — ED Provider Notes (Addendum)
[facility]-EMERGENCY DEPT Provider Note   CSN: [id number] Arrival date & time: 10/28/15  0335     History   Chief Complaint Chief Complaint  Patient presents with  . Emesis    HPI Brandi Heath is a 82 y.o. female.  HPI 82 y.o.femalewith a past medical history of dementia, previous episodes of small bowel obstruction, atrial fibrillation on anticoagulation, history of PE and DVT, history of stroke comes in from SNF with cc of emesis and possible SBO. Pt denies abd pain. States that she is passing flatus and having BM. She did admit to emesis and nausea. Pt is a poor historian.  Pt is noted to be in afib with RVR. Pt denies fevers, chills, chest pains, shortness of breath, headaches, abdominal pain, uti like symptoms, dizziness.    Past Medical History:  Diagnosis Date  . Atrial fibrillation (HCC)   . CHF (congestive heart failure) (HCC)   . Clotting disorder (HCC)   . Coronary artery disease   . Dementia without behavioral disturbance   . DVT (deep venous thrombosis) (HCC)   . Hyperlipidemia   . Hypertension   . OSA (obstructive sleep apnea)   . PE (pulmonary embolism)   . Polyneuropathy (HCC)   . SBO (small bowel obstruction)   . Seizures (HCC)   . Stroke [facility])     Patient Active Problem List   Diagnosis Date Noted  . Atrial fibrillation with RVR (HCC) 10/28/2015  . Acute respiratory failure with hypoxia (HCC) 10/17/2015  . Slurred speech   . Left sided numbness   . Acute on chronic diastolic heart failure (HCC) 10/04/2015  . Normocytic anemia 10/04/2015  . Thrombocytopenia (HCC) 10/04/2015  . TIA (transient ischemic attack) 10/04/2015  . Cough 10/02/2015  . SBO (small bowel obstruction) 10/01/2015  . Chronic CHF (congestive heart failure) (HCC) 09/29/2015  . Venous insufficiency (chronic) (peripheral) 09/29/2015  . Vitamin B12 deficiency 09/29/2015  . Anemia, chronic disease 09/29/2015  . Enterococcus UTI 09/02/2015  . GERD (gastroesophageal reflux disease)  09/02/2015  . Adynamic ileus (HCC) 06/11/2015  . CHF, acute on chronic (HCC) 06/11/2015  . Personal history of DVT (deep vein thrombosis) 06/11/2015  . Nausea with vomiting 06/03/2015  . Jaw pain 05/27/2015  . E. coli UTI 04/22/2015  . Prerenal acute renal failure (HCC) 04/22/2015  . History of CVA (cerebrovascular accident) 04/22/2015  . AF (atrial fibrillation) (HCC) 04/22/2015  . Polyneuropathy (HCC) 04/22/2015  . Edema 01/25/2015  . Atrial fibrillation with rapid ventricular response (HCC) 01/20/2015  . Acute encephalopathy 01/20/2015  . UTI (urinary tract infection) 01/20/2015  . Tracheobronchomalacia 01/20/2015  . Personal history of PE (pulmonary embolism) 01/20/2015  . Depression 01/20/2015  . OSA (obstructive sleep apnea)   . Dementia with behavioral disturbance   . Seizures (HCC)   . Hyperlipidemia   . Hypertensive heart disease with CHF (congestive heart failure) (HCC)   . Clotting disorder (HCC)   . Cerebrovascular accident (CVA) due to thrombosis [facility])     Past Surgical History:  Procedure Laterality Date  . ABDOMINAL AORTIC ANEURYSM REPAIR    . ABDOMINAL HYSTERECTOMY    . CHOLECYSTECTOMY    . SPINAL FUSION  2003    OB History    No data available       Home Medications    Prior to Admission medications   Medication Sig Start Date End Date Taking? Authorizing Provider  acetaminophen (TYLENOL) 325 MG tablet Take 650 mg by mouth every 4 (four) hours as needed (Pain). pain  Yes Historical Provider, MD  apixaban (ELIQUIS) 2.5 MG TABS tablet Take 2.5 mg by mouth 2 (two) times daily.   Yes Historical Provider, MD  atorvastatin (LIPITOR) 20 MG tablet Take 20 mg by mouth daily.   Yes Historical Provider, MD  Calcium Carbonate-Vitamin D (CALCIUM-VITAMIN D) 500-200 MG-UNIT tablet Take 1 tablet by mouth daily.    Yes Historical Provider, MD  Dextromethorphan-Guaifenesin (GUAIFENESIN DM PO) Take 10 mLs by mouth every 4 (four) hours as needed (Cough).    Yes  Historical Provider, MD  diltiazem (TIAZAC) 360 MG 24 hr capsule Take 360 mg by mouth daily.   Yes Historical Provider, MD  donepezil (ARICEPT) 10 MG tablet Take 10 mg by mouth at bedtime.    Yes Historical Provider, MD  DULoxetine (CYMBALTA) 60 MG capsule Take 60 mg by mouth at bedtime.    Yes Historical Provider, MD  furosemide (LASIX) 40 MG tablet Take 40 mg by mouth daily.   Yes Historical Provider, MD  Iron-FA-B Cmp-C-Biot-Probiotic (FUSION PLUS) CAPS Take 1 capsule by mouth daily.   Yes Historical Provider, MD  levETIRAcetam (KEPPRA) 500 MG tablet Take 500 mg by mouth 2 (two) times daily.   Yes Historical Provider, MD  loperamide (IMODIUM) 2 MG capsule Take 4 mg by mouth 4 (four) times daily as needed for diarrhea or loose stools.   Yes Historical Provider, MD  Magnesium Oxide -Mg Supplement 400 MG CAPS Take 1 tablet by mouth daily.   Yes Historical Provider, MD  Menthol-Methyl Salicylate (MUSCLE RUB) 10-15 % CREA Apply 1 application topically 3 (three) times daily as needed for muscle pain.   Yes Historical Provider, MD  metoprolol (LOPRESSOR) 100 MG tablet Take 1 tablet (100 mg total) by mouth 2 (two) times daily. 10/08/15  Yes Renae Fickle, MD  Multiple Vitamins-Minerals (MULTIVITAMIN WITH MINERALS) tablet Take 1 tablet by mouth daily.   Yes Historical Provider, MD  Nutritional Supplements (ENSURE ENLIVE PO) Take 1 Can by mouth daily before breakfast.   Yes Historical Provider, MD  omeprazole (PRILOSEC) 40 MG capsule Take 40 mg by mouth 2 (two) times daily.   Yes Historical Provider, MD  polyethylene glycol (MIRALAX / GLYCOLAX) packet Take 17 g by mouth daily. Hold for more than 3 bms in a day   Yes Historical Provider, MD  potassium chloride SA (K-DUR,KLOR-CON) 20 MEQ tablet Take 1 tablet (20 mEq total) by mouth daily. 10/08/15  Yes Renae Fickle, MD  promethazine (PHENERGAN) 25 MG suppository Place 25 mg rectally every 12 (twelve) hours as needed for nausea or vomiting.   Yes Historical  Provider, MD  risperiDONE (RISPERDAL) 1 MG tablet Take 1 mg by mouth 2 (two) times daily.   Yes Historical Provider, MD  travoprost, benzalkonium, (TRAVATAN) 0.004 % ophthalmic solution Place 1 drop into both eyes at bedtime.   Yes Historical Provider, MD  enoxaparin (LOVENOX) 100 MG/ML injection Inject 1 mL (100 mg total) into the skin daily. Patient not taking: Reported on 10/28/2015 10/08/15   Renae Fickle, MD    Family History Family History  Problem Relation Age of Onset  . Hypertension Mother   . Heart disease Mother   . Heart disease Father   . Cancer Sister     breast  . Diabetes Sister   . Heart disease Son   . Hypertension Son   . Thyroid disease Sister     Social History Social History  Substance Use Topics  . Smoking status: Never Smoker  . Smokeless tobacco: Never Used  .  Alcohol use No     Allergies   Chocolate; Lactose intolerance (gi); and Penicillins   Review of Systems Review of Systems  ROS 10 Systems reviewed and are negative for acute change except as noted in the HPI.     Physical Exam Updated Vital Signs BP 114/100   Pulse (!) 136   Temp 98.6 F (37 C) (Oral)   Resp 16   Ht 5\' 5"  (1.651 m)   Wt 155 lb (70.3 kg)   SpO2 94%   BMI 25.79 kg/m   Physical Exam  Constitutional: She is oriented to person, place, and time. She appears well-developed and well-nourished.  HENT:  Head: Normocephalic and atraumatic.  Eyes: EOM are normal. Pupils are equal, round, and reactive to light.  Neck: Neck supple.  Cardiovascular: Normal rate, regular rhythm and normal heart sounds.   No murmur heard. Pulmonary/Chest: Effort normal. No respiratory distress.  Abdominal: Soft. She exhibits no distension. There is no tenderness. There is no rebound and no guarding.  Neurological: She is alert and oriented to person, place, and time.  Skin: Skin is warm and dry.  Nursing note and vitals reviewed.    ED Treatments / Results  Labs (all labs  ordered are listed, but only abnormal results are displayed) Labs Reviewed  COMPREHENSIVE METABOLIC PANEL - Abnormal; Notable for the following:       Result Value   Glucose, Bld 195 (*)    ALT 13 (*)    GFR calc non Af Amer 51 (*)    GFR calc Af Amer 60 (*)    All other components within normal limits  CBC - Abnormal; Notable for the following:    Hemoglobin 11.6 (*)    HCT 32.7 (*)    All other components within normal limits  URINE CULTURE  LIPASE, BLOOD  URINALYSIS, ROUTINE W REFLEX MICROSCOPIC (NOT AT [facility])  MAGNESIUM  TROPONIN I  PROTIME-INR    EKG  EKG Interpretation  Date/Time:  Sunday October 28 2015 04:58:35 EDT Ventricular Rate:  142 PR Interval:    QRS Duration: 90 QT Interval:  308 QTC Calculation: 474 R Axis:   95 Text Interpretation:  Right and left arm electrode reversal, interpretation assumes no reversal Atrial fibrillation with rapid V-rate Ventricular premature complex Anteroseptal infarct, age indeterminate No acute changes No significant change since last tracing Confirmed by Rhunette Croft, MD, Janey Genta [phone]) on 10/28/2015 6:07:50 AM       Radiology Dg Abd Acute W/chest  Result Date: 10/28/2015 CLINICAL DATA:  Emesis. Recent small bowel obstruction. Evaluate for pneumonia. EXAM: DG ABDOMEN ACUTE W/ 1V CHEST COMPARISON:  Chest radiograph 10/07/2015. CT abdomen/ pelvis 10/01/2015 FINDINGS: Stable cardiomegaly. Stable interstitial and reticular prominence. Reticulonodular opacities at the lung bases, may be calcified, unchanged. No new focal airspace disease. No evidence of free intra-abdominal air. Dilated small bowel loops in the central abdomen measures up to 3.8 cm. Multiple serpiginous and irregular linear opacities projecting over the upper abdomen are likely external to the patient. There are cholecystectomy clips. Post aorta bi-iliac stenting. Unchanged osseous structures. IMPRESSION: 1. Air-filled dilated loops of small bowel in the central abdomen,  similar to prior exam. Recurrent small bowel obstruction not excluded. 2. Suspected overlying artifact partially obscuring evaluation of the abdomen. 3. Chronic changes in the lungs with lower lobe predominant reticulonodular opacities that may be calcified. No confluent consolidation to suggest pneumonia. Electronically Signed   By: Rubye Oaks M.D.   On: 10/28/2015 06:50    Procedures .  Critical Care Performed by: Derwood KaplanNANAVATI, Ruchy Wildrick Authorized by: Derwood KaplanNANAVATI, Rosha Cocker   Critical care provider statement:    Critical care time (minutes):  55   Critical care time was exclusive of:  Separately billable procedures and treating other patients   Critical care was necessary to treat or prevent imminent or life-threatening deterioration of the following conditions:  Circulatory failure   Critical care was time spent personally by me on the following activities:  Blood draw for specimens, development of treatment plan with patient or surrogate, discussions with consultants, evaluation of patient's response to treatment, examination of patient, ordering and performing treatments and interventions, ordering and review of laboratory studies, pulse oximetry, re-evaluation of patient's condition, ordering and review of radiographic studies and review of old charts   (including critical care time)  Medications Ordered in ED Medications  diltiazem (CARDIZEM) 1 mg/mL load via infusion 10 mg (10 mg Intravenous Bolus from Bag 10/28/15 0823)    And  diltiazem (CARDIZEM) 100 mg in dextrose 5% 100mL (1 mg/mL) infusion (5 mg/hr Intravenous New Bag/Given 10/28/15 0820)  ondansetron (ZOFRAN) injection 4 mg (4 mg Intravenous Given 10/28/15 0446)  sodium chloride 0.9 % bolus 500 mL (0 mLs Intravenous Stopped 10/28/15 0728)     Initial Impression / Assessment and Plan / ED Course  I have reviewed the triage vital signs and the nursing notes.  Pertinent labs & imaging results that were available during my care of the  patient were reviewed by me and considered in my medical decision making (see chart for details).  Clinical Course  Comment By Time  RN informed me that pt just had a BM. It was loose, but not completely. AAS had showed possible SBO. I just cancelled the NG tube. Pt's abd is not really tender. We will attempt oral challenge. If she fails oral challenge, CT abdomen can be done by admitting team. Additionally, pt is in RVR. She is persistently in RVR. We will get diltazem started. Derwood KaplanAnkit Johncharles Fusselman, MD 10/15 0745  Dr. Carolynne Edouardoth and I spoke over the phone. He recommends that if the abd is soft, patient had a bowel movement in the ER and there is no significant tenderness to the abdomen to allow for clear liquid challenge. If pt keeps having emesis, consider NG tube then, or surgery consult. Dr. Randol KernElgergawy aware of these recs. Daughter reports surgery would be very last resort. Derwood KaplanAnkit Jonelle Bann, MD 10/15 [phone]    N/V - we will get AAS. Exam is benign. Pt is passing gas. Clinical suspicion for SBO is low. Ileus vs. Partial sbo?  Afib with RVR: We will get basic labs and screen for infection. Could be due to the GI  Unrest. On eliquis, so PE considered less likely. Last admission was for the same...  dilt drip to be started. Final Clinical Impressions(s) / ED Diagnoses   Final diagnoses:  Atrial fibrillation with RVR (HCC)  Non-intractable vomiting with nausea, unspecified vomiting type    New Prescriptions New Prescriptions   No medications on file     Derwood KaplanAnkit Sharell Hilmer, MD 10/28/15 [phone]    Derwood KaplanAnkit Cresencia Asmus, MD 10/28/15 [phone]

## 2015-10-28 NOTE — ED Notes (Signed)
Point of contact pt's daughter Babette Relic [phone]

## 2015-10-28 NOTE — ED Notes (Signed)
Pt had about 2 oz of ginger ale for oral trial, tolerated well, denies nausea.

## 2015-10-28 NOTE — Progress Notes (Signed)
ANTICOAGULATION CONSULT NOTE - Initial Consult  Pharmacy Consult for Lovenox Indication: A.fib and hx of VTE  Allergies  Allergen Reactions  . Chocolate Diarrhea  . Lactose Intolerance (Gi) Diarrhea  . Penicillins Other (See Comments)    Reaction:  Unknown  Has patient had a PCN reaction causing immediate rash, facial/tongue/throat swelling, SOB or lightheadedness with hypotension: Unsure Has patient had a PCN reaction causing severe rash involving mucus membranes or skin necrosis: Unsure Has patient had a PCN reaction that required hospitalization:  Unsure  Has patient had a PCN reaction occurring within the last 10 years: Unsure If all of the above answers are "NO", then may proceed with Cephalosporin use.    Patient Measurements: Height: 5\' 7"  (170.2 cm) Weight: 150 lb 2.1 oz (68.1 kg) IBW/kg (Calculated) : 61.6  Vital Signs: Temp: 98.3 F (36.8 C) (10/15 1200) Temp Source: Oral (10/15 1200) BP: 120/65 (10/15 1200) Pulse Rate: 106 (10/15 1200)  Labs:  Recent Labs  10/28/15 0440  HGB 11.6*  HCT 32.7*  PLT 181  CREATININE 0.97    Estimated Creatinine Clearance: 40.5 mL/min (by C-G formula based on SCr of 0.97 mg/dL).   Medical History: Past Medical History:  Diagnosis Date  . Atrial fibrillation (HCC)   . CHF (congestive heart failure) (HCC)   . Clotting disorder (HCC)   . Coronary artery disease   . Dementia without behavioral disturbance   . DVT (deep venous thrombosis) (HCC)   . Hyperlipidemia   . Hypertension   . OSA (obstructive sleep apnea)   . PE (pulmonary embolism)   . Polyneuropathy (HCC)   . SBO (small bowel obstruction)   . Seizures (HCC)   . Stroke [facility](HCC)     Medications:  Prescriptions Prior to Admission  Medication Sig Dispense Refill Last Dose  . acetaminophen (TYLENOL) 325 MG tablet Take 650 mg by mouth every 4 (four) hours as needed for mild pain, moderate pain or headache. pain    Past Month at Unknown time  . apixaban (ELIQUIS)  2.5 MG TABS tablet Take 2.5 mg by mouth 2 (two) times daily.   10/27/2015 at 1700  . atorvastatin (LIPITOR) 20 MG tablet Take 20 mg by mouth at bedtime.    10/27/2015 at Unknown time  . Calcium Carbonate-Vitamin D (CALCIUM-VITAMIN D) 500-200 MG-UNIT tablet Take 1 tablet by mouth daily.    10/27/2015 at Unknown time  . diltiazem (TIAZAC) 360 MG 24 hr capsule Take 360 mg by mouth daily.   10/27/2015 at Unknown time  . donepezil (ARICEPT) 10 MG tablet Take 10 mg by mouth at bedtime.    10/27/2015 at Unknown time  . DULoxetine (CYMBALTA) 60 MG capsule Take 60 mg by mouth at bedtime.    10/27/2015 at Unknown time  . furosemide (LASIX) 40 MG tablet Take 40 mg by mouth daily.   10/27/2015 at Unknown time  . guaifenesin (ROBITUSSIN) 100 MG/5ML syrup Take 200 mg by mouth daily as needed for cough.   Past Month at Unknown time  . Iron-FA-B Cmp-C-Biot-Probiotic (FUSION PLUS) CAPS Take 1 capsule by mouth daily.   10/27/2015 at Unknown time  . levETIRAcetam (KEPPRA) 500 MG tablet Take 500 mg by mouth 2 (two) times daily.   10/27/2015 at Unknown time  . loperamide (IMODIUM) 2 MG capsule Take 4 mg by mouth 4 (four) times daily as needed for diarrhea or loose stools.   Past Month at Unknown time  . magnesium oxide (MAG-OX) 400 (241.3 Mg) MG tablet Take 400 mg  by mouth daily.   10/27/2015 at Unknown time  . Menthol-Methyl Salicylate (MUSCLE RUB) 10-15 % CREA Apply 1 application topically 3 (three) times daily as needed (for leg pain).    Past Month at Unknown time  . metoprolol (LOPRESSOR) 100 MG tablet Take 1 tablet (100 mg total) by mouth 2 (two) times daily. 60 tablet 0 10/27/2015 at 2200  . Multiple Vitamins-Minerals (MULTIVITAMIN WITH MINERALS) tablet Take 1 tablet by mouth daily.   10/27/2015 at Unknown time  . Nutritional Supplements (ENSURE ENLIVE PO) Take 1 Can by mouth daily before breakfast.   10/27/2015 at Unknown time  . omeprazole (PRILOSEC) 40 MG capsule Take 40 mg by mouth 2 (two) times daily.    10/27/2015 at Unknown time  . polyethylene glycol (MIRALAX / GLYCOLAX) packet Take 17 g by mouth daily.    10/27/2015 at Unknown time  . potassium chloride SA (K-DUR,KLOR-CON) 20 MEQ tablet Take 1 tablet (20 mEq total) by mouth daily. 30 tablet 0 10/27/2015 at Unknown time  . promethazine (PHENERGAN) 25 MG suppository Place 25 mg rectally every 12 (twelve) hours as needed for nausea or vomiting.   Past Month at Unknown time  . risperiDONE (RISPERDAL) 1 MG tablet Take 1 mg by mouth 2 (two) times daily.    10/27/2015 at Unknown time  . travoprost, benzalkonium, (TRAVATAN) 0.004 % ophthalmic solution Place 1 drop into both eyes at bedtime.   10/27/2015 at Unknown time  . enoxaparin (LOVENOX) 100 MG/ML injection Inject 1 mL (100 mg total) into the skin daily. (Patient not taking: Reported on 10/28/2015) 0 Syringe  Not Taking at Unknown time   Scheduled:  . atorvastatin  20 mg Oral Daily  . levETIRAcetam  500 mg Intravenous Q12H    Assessment: 82yo F admitted with emesis. Takes apixaban for Afib and hx of VTE. Pharmacy is asked to dose Lovenox while NPO and not able to take apixaban. Last dose was >12hrs ago. CBC is ok. SCr is wnl, CrCl 48ml/min.    Goal of Therapy:  Appropriate dosing for weight and renal fxn. Monitor platelets by anticoagulation protocol: Yes   Plan:  Begin Lovenox 100mg  SQ q24h. (1.5mg /kg) F/u daily.  Charolotte Eke, PharmD, pager [phone]. 10/28/2015,12:40 PM.

## 2015-10-28 NOTE — ED Notes (Signed)
While cleaning patient from having a bowel movement patient stated she had to urinate placed patient on bed pan for a few moments patient was unable to provide a urine sample after all.

## 2015-10-29 ENCOUNTER — Inpatient Hospital Stay (HOSPITAL_COMMUNITY): Payer: Medicare Other

## 2015-10-29 DIAGNOSIS — I5022 Chronic systolic (congestive) heart failure: Secondary | ICD-10-CM

## 2015-10-29 LAB — VAS US CAROTID
LCCADDIAS: 12 cm/s
LCCADSYS: 40 cm/s
LEFT ECA DIAS: -7 cm/s
LEFT VERTEBRAL DIAS: 10 cm/s
LICADSYS: -53 cm/s
LICAPDIAS: 10 cm/s
Left CCA prox dias: -13 cm/s
Left CCA prox sys: -60 cm/s
Left ICA dist dias: -19 cm/s
Left ICA prox sys: 38 cm/s
RCCAPDIAS: 14 cm/s
RCCAPSYS: 69 cm/s
RIGHT ECA DIAS: -9 cm/s
RIGHT VERTEBRAL DIAS: 11 cm/s
Right cca dist sys: -80 cm/s

## 2015-10-29 LAB — BASIC METABOLIC PANEL
Anion gap: 6 (ref 5–15)
BUN: 14 mg/dL (ref 4–21)
BUN: 14 mg/dL (ref 6–20)
CALCIUM: 8.8 mg/dL — AB (ref 8.9–10.3)
CO2: 25 mmol/L (ref 22–32)
CREATININE: 0.93 mg/dL (ref 0.44–1.00)
Chloride: 111 mmol/L (ref 101–111)
Creatinine: 0.9 mg/dL (ref 0.5–1.1)
GFR calc Af Amer: >60 mL/min (ref >60)
GFR, EST NON AFRICAN AMERICAN: 54 mL/min — AB (ref >60)
GLUCOSE: 95 mg/dL
Glucose, Bld: 95 mg/dL (ref 65–99)
Potassium: 3.9 mmol/L (ref 3.4–5.3)
Potassium: 3.9 mmol/L (ref 3.5–5.1)
SODIUM: 142 mmol/L (ref 135–145)
Sodium: 142 mmol/L (ref 137–147)

## 2015-10-29 LAB — CBC
HCT: 26.4 % — ABNORMAL LOW (ref 36.0–46.0)
Hemoglobin: 9 g/dL — ABNORMAL LOW (ref 12.0–15.0)
MCH: 28.8 pg (ref 26.0–34.0)
MCHC: 34.1 g/dL (ref 30.0–36.0)
MCV: 84.6 fL (ref 78.0–100.0)
PLATELETS: 158 10*3/uL (ref 150–400)
RBC: 3.12 MIL/uL — ABNORMAL LOW (ref 3.87–5.11)
RDW: 13.5 % (ref 11.5–15.5)
WBC: 4.2 10*3/uL (ref 4.0–10.5)

## 2015-10-29 LAB — CBC AND DIFFERENTIAL: WBC: 4.2 10*3/mL

## 2015-10-29 LAB — MAGNESIUM: Magnesium: 1.5 mg/dL — ABNORMAL LOW (ref 1.7–2.4)

## 2015-10-29 MED ORDER — MAGNESIUM SULFATE 50 % IJ SOLN
3.0000 g | Freq: Once | INTRAVENOUS | Status: AC
  Administered 2015-10-29: 3 g via INTRAVENOUS
  Filled 2015-10-29: qty 6

## 2015-10-29 MED ORDER — DILTIAZEM HCL ER BEADS 240 MG PO CP24
360.0000 mg | ORAL_CAPSULE | Freq: Every day | ORAL | Status: DC
  Administered 2015-10-29: 360 mg via ORAL
  Filled 2015-10-29 (×2): qty 1

## 2015-10-29 MED ORDER — APIXABAN 2.5 MG PO TABS
2.5000 mg | ORAL_TABLET | Freq: Two times a day (BID) | ORAL | Status: DC
  Administered 2015-10-29 – 2015-10-31 (×5): 2.5 mg via ORAL
  Filled 2015-10-29 (×5): qty 1

## 2015-10-29 MED ORDER — METOPROLOL TARTRATE 50 MG PO TABS
100.0000 mg | ORAL_TABLET | Freq: Two times a day (BID) | ORAL | Status: DC
  Administered 2015-10-29 – 2015-10-31 (×3): 100 mg via ORAL
  Filled 2015-10-29 (×3): qty 2
  Filled 2015-10-29: qty 4

## 2015-10-29 NOTE — Progress Notes (Signed)
Patient Name: Brandi Heath Date of Encounter: 10/29/2015  Primary Cardiologist: New (Dr. Hanley [facility]osario, [facility])   [facility]     Active Problems:   Dementia with behavioral disturbance   Seizures (HCC)   Personal history of PE (pulmonary embolism)   Depression   History of CVA (cerebrovascular accident)   Personal history of DVT (deep vein thrombosis)   GERD (gastroesophageal reflux disease)   Chronic CHF (congestive heart failure) (HCC)   SBO (small bowel obstruction)   Atrial fibrillation with RVR (HCC)     Subjective   Resting comfortably. Daughter in room. No CP or SOb  Inpatient Medications    Scheduled Meds: . atorvastatin  20 mg Oral Daily  . enoxaparin (LOVENOX) injection  1.5 mg/kg Subcutaneous Q24H  . levETIRAcetam  500 mg Intravenous Q12H   Continuous Infusions: . 0.9 % NaCl with KCl 40 mEq / L 75 mL/hr at 10/29/15 0800  . diltiazem (CARDIZEM) infusion 5 mg/hr (10/29/15 0800)   PRN Meds: acetaminophen, ondansetron (ZOFRAN) IV   Vital Signs    Vitals:   10/29/15 0500 10/29/15 0600 10/29/15 0700 10/29/15 0743  BP: (!) 110/47 121/61  (!) 117/55  Pulse: 62 86 71   Resp: 19 17  14   Temp:      TempSrc:      SpO2: 97% 98% 98%   Weight:      Height:        Intake/Output Summary (Last 24 hours) at 10/29/15 0945 Last data filed at 10/29/15 0800  Gross per 24 hour  Intake          1974.33 ml  Output                0 ml  Net          1974.33 ml   Filed Weights   10/28/15 0339 10/28/15 1200  Weight: 155 lb (70.3 kg) 150 lb 2.1 oz (68.1 kg)    Physical Exam   GEN: Well nourished, well developed, in no acute distress. Elderly and frail HEENT: Grossly normal.  Neck: Supple, no JVD, carotid bruits, or masses. Cardiac: irreg irreg, systolic murmur at apex, no murmurs, rubs, or gallops. No clubbing, cyanosis, edema.  Radials/DP/PT 2+ and equal bilaterally.  Respiratory:  Respirations regular and unlabored, clear to auscultation  bilaterally. GI: Soft, nontender, nondistended, BS + x 4. MS: no deformity or atrophy. Skin: warm and dry, no rash. Neuro:  Strength and sensation are intact. Psych: AAOx3.  Normal affect.  Labs    CBC  Recent Labs  10/28/15 0440 10/29/15 0403  WBC 7.5 4.2  HGB 11.6* 9.0*  HCT 32.7* 26.4*  MCV 83.6 84.6  PLT 181 158   Basic Metabolic Panel  Recent Labs  10/28/15 0440 10/28/15 1845 10/29/15 0403  NA 140  --  142  K 3.5  --  3.9  CL 105  --  111  CO2 26  --  25  GLUCOSE 195*  --  95  BUN 17  --  14  CREATININE 0.97  --  0.93  CALCIUM 9.5  --  8.8*  MG  --  1.5*  --    Liver Function Tests  Recent Labs  10/28/15 0440  AST 17  ALT 13*  ALKPHOS 110  BILITOT 0.9  PROT 7.1  ALBUMIN 3.5    Recent Labs  10/28/15 0440  LIPASE 23   Cardiac Enzymes  Recent Labs  10/28/15 1845  TROPONINI <0.03   BNP  Invalid input(s): POCBNP D-Dimer No results for input(s): DDIMER in the last 72 hours. Hemoglobin A1C No results for input(s): HGBA1C in the last 72 hours. Fasting Lipid Panel No results for input(s): CHOL, HDL, LDLCALC, TRIG, CHOLHDL, LDLDIRECT in the last 72 hours. Thyroid Function Tests No results for input(s): TSH, T4TOTAL, T3FREE, THYROIDAB in the last 72 hours.  Invalid input(s): FREET3  Telemetry    afib with CVR - Personally Reviewed  ECG    Atrial fibrillation with RVR HR 142 - Personally Reviewed  Radiology    Dg Abd Acute W/chest  Result Date: 10/28/2015 CLINICAL DATA:  Emesis. Recent small bowel obstruction. Evaluate for pneumonia. EXAM: DG ABDOMEN ACUTE W/ 1V CHEST COMPARISON:  Chest radiograph 10/07/2015. CT abdomen/ pelvis 10/01/2015 FINDINGS: Stable cardiomegaly. Stable interstitial and reticular prominence. Reticulonodular opacities at the lung bases, may be calcified, unchanged. No new focal airspace disease. No evidence of free intra-abdominal air. Dilated small bowel loops in the central abdomen measures up to 3.8 cm. Multiple  serpiginous and irregular linear opacities projecting over the upper abdomen are likely external to the patient. There are cholecystectomy clips. Post aorta bi-iliac stenting. Unchanged osseous structures. IMPRESSION: 1. Air-filled dilated loops of small bowel in the central abdomen, similar to prior exam. Recurrent small bowel obstruction not excluded. 2. Suspected overlying artifact partially obscuring evaluation of the abdomen. 3. Chronic changes in the lungs with lower lobe predominant reticulonodular opacities that may be calcified. No confluent consolidation to suggest pneumonia. Electronically Signed   By: Rubye Oaks M.D.   On: 10/28/2015 06:50   Dg Abd Portable 1v  Result Date: 10/29/2015 CLINICAL DATA:  Recent small bowel obstruction EXAM: PORTABLE ABDOMEN - 1 VIEW COMPARISON:  October 28, 2015 abdominal radiographs; CT abdomen and pelvis October 01, 2015 FINDINGS: There is currently no appreciable bowel dilatation or air-fluid level to suggest bowel obstruction. No evident free air. There is a stent in the aorta and common iliac arteries. There is a filter in the inferior vena cava with the apex of the filter at L3-4, stable. There are surgical clips in the right upper quadrant region. Visualized lung bases appear clear. IMPRESSION: Extensive postoperative change. The bowel gas pattern is unremarkable. No demonstrable bowel obstruction or free air. Electronically Signed   By: Bretta Bang III M.D.   On: 10/29/2015 07:14    Cardiac Studies   2D ECHO: 10/06/2015 LV EF: 40% -   45% Study Conclusions - Left ventricle: The cavity size was normal. Systolic function was   mildly to moderately reduced. The estimated ejection fraction was   in the range of 40% to 45%. Wall motion was normal; there were no   regional wall motion abnormalities. The study was not technically   sufficient to allow evaluation of LV diastolic dysfunction due to   atrial fibrillation. - Aortic valve:  Trileaflet; moderately thickened, mildly calcified   leaflets. There was mild regurgitation. - Mitral valve: Mild diffuse calcification of the anterior leaflet.   There was moderate to severe regurgitation. - Left atrium: The atrium was severely dilated. - Right ventricle: Systolic function was mildly reduced. - Right atrium: There is a mobile thin density stranded density in   the RA that could represent chiari but in setting of TIA and   interatrial septal aneurysm, recommend TEE for further   evaluation. The atrium was moderately dilated. 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 No thrombus was  identified within the aneurysm. - Tricuspid valve: There was mild-moderate regurgitation. - Pulmonary arteries: PA peak pressure: 63 mm Hg (S). Impressions: - The right ventricular systolic pressure was increased consistent   with moderate pulmonary hypertension. Recommendations:  Consider transesophageal echocardiography if clinically indicated in order to exclude intracardiac thrombus as well as further assess degree of mitral regurgitation.  Patient Profile      Ruthe Roemer is a 82 y.o. female nursing home resident, with h/o dementia, chronic atrial fibrillation on chronic anticoagulation, LV dysfunction (EF 40-45%), pulm HTN, mod-severe MR, h/o PE/DVT, stroke, seizure disorder and recent SBO who [facility] with n/v. Cardiology consulted for atrial fibrillation w/ RVR.    Assessment & Plan    1.  Persistent atrial fibrillation: though the duration of her afib is not clearly known, she was in afib on her hospitalization in 09/2015.  Her echo favors permanent afib. Per Dr. Johney Frame, attempts to restore NSR would likely be unsuccessful. Long term rate control is appropriate.  She was on eliquis prior to admission and is currently on full dose lovenox as per hospitalist team.  Would transition back to  eliquis 2.5mg  BID when taking POs and otherwise stable.  -- Chads2vasc score is 8. -- Currently on IV diltiazem drip. She is having BMs and no further n/v. Plan is to give her a diet. Will convert back to prior oral dosing of diltiazem CD 360mg  daily and metoprolol 100mg  BID and restart Eliquis 2.5mg  BID. Discussed with RN, I will order meds and we will plan to start when she gets her first meal and then she will discontinued gtts.  2. Nonischemic CM/ chronic systolic dysfunction: caution with IVF. Appears euvolemic.   3. SBO: Per primary team  4. Dementia: Stable. No change required today  5. Mod-Severe MR: no further work up at this point. Follow clinically.   6. Dispo: Conservative measures would seem appropriate.  She is appropriately DNR.   Signed, Cline Crock, PA-C  10/29/2015, 9:45 AM   Personally seen and examined. Agree with above. AFIB as above. Converting to PO meds Rate control Eliquis.  No invasive tx.  Irreg irreg, CTAB  Donato Schultz, MD

## 2015-10-29 NOTE — Progress Notes (Signed)
PROGRESS NOTE    Brandi Heath  BXI:[id number] DOB: [birth year] DOA: 10/28/2015 PCP: Merrilee Seashore, MD    Brief Narrative:  Brandi Heath  is a 82 y.o. female, with a past medical history of dementia, previous episodes of small bowel obstruction, atrial fibrillation on anticoagulation, history of PE and DVT, history of stroke, history of seizure disorder, who presents with complaints of nausea and vomiting overnight, was found to have partial SBO and afib with RVR.   Assessment & Plan:   Active Problems:   Dementia with behavioral disturbance   Seizures (HCC)   Personal history of PE (pulmonary embolism)   Depression   History of CVA (cerebrovascular accident)   Personal history of DVT (deep vein thrombosis)   GERD (gastroesophageal reflux disease)   Chronic CHF (congestive heart failure) (HCC)   SBO (small bowel obstruction)   Atrial fibrillation with RVR (HCC)   SBO: Improved, started on clear liquid diet, KUB shows improvement in SBO.  Replete electrolytes.    Afib with RVR: initially started on GTT cardizem, transitioned to po cardizem.  Cardiology consulted and recommendations given.   Seizures: On keppra.    H/o DVT and PE: ON eliquis at home.    DVT prophylaxis: eliquis.  Code Status: DNR Family Communication: NONE  At bedside.  Disposition Plan: pending PT eval.    Consultants:   Cardiology  Surgery over the phone.    Procedures: none.    Antimicrobials: none.    Subjective: BM this am.   Objective: Vitals:   10/29/15 1107 10/29/15 1108 10/29/15 1200 10/29/15 1330  BP: 117/75  123/72 101/62  Pulse:  93 72 (!) 58  Resp:   (!) 21 18  Temp:    98.4 F (36.9 C)  TempSrc:    Oral  SpO2:   91% 100%  Weight:      Height:        Intake/Output Summary (Last 24 hours) at 10/29/15 1744 Last data filed at 10/29/15 1232  Gross per 24 hour  Intake             2028 ml  Output                0 ml  Net             2028 ml   Filed Weights   10/28/15 0339 10/28/15 1200  Weight: 70.3 kg (155 lb) 68.1 kg (150 lb 2.1 oz)    Examination:  General exam: Appears calm and comfortable  Respiratory system: Clear to auscultation. Respiratory effort normal. Cardiovascular system: S1 & S2 heard, irregular.  Gastrointestinal system: Abdomen is nondistended, soft and nontender. No organomegaly or masses felt. Normal bowel sounds heard. Central nervous system: Alert and slightly confused. . No focal neurological deficits. Extremities: Symmetric 5 x 5 power. Skin: No rashes, lesions or ulcers     Data Reviewed: I have personally reviewed following labs and imaging studies  CBC:  Recent Labs Lab 10/28/15 0440 10/29/15 0403  WBC 7.5 4.2  HGB 11.6* 9.0*  HCT 32.7* 26.4*  MCV 83.6 84.6  PLT 181 158   Basic Metabolic Panel:  Recent Labs Lab 10/28/15 0440 10/28/15 1845 10/29/15 0403  NA 140  --  142  K 3.5  --  3.9  CL 105  --  111  CO2 26  --  25  GLUCOSE 195*  --  95  BUN 17  --  14  CREATININE 0.97  --  0.93  CALCIUM 9.5  --  8.8*  MG  --  1.5* 1.5*   GFR: Estimated Creatinine Clearance: 42.2 mL/min (by C-G formula based on SCr of 0.93 mg/dL). Liver Function Tests:  Recent Labs Lab 10/28/15 0440  AST 17  ALT 13*  ALKPHOS 110  BILITOT 0.9  PROT 7.1  ALBUMIN 3.5    Recent Labs Lab 10/28/15 0440  LIPASE 23   No results for input(s): AMMONIA in the last 168 hours. Coagulation Profile:  Recent Labs Lab 10/28/15 1845  INR 1.21   Cardiac Enzymes:  Recent Labs Lab 10/28/15 1845  TROPONINI <0.03   BNP (last 3 results) No results for input(s): PROBNP in the last 8760 hours. HbA1C: No results for input(s): HGBA1C in the last 72 hours. CBG: No results for input(s): GLUCAP in the last 168 hours. Lipid Profile: No results for input(s): CHOL, HDL, LDLCALC, TRIG, CHOLHDL, LDLDIRECT in the last 72 hours. Thyroid Function Tests: No results for input(s): TSH, T4TOTAL, FREET4, T3FREE, THYROIDAB in the  last 72 hours. Anemia Panel: No results for input(s): VITAMINB12, FOLATE, FERRITIN, TIBC, IRON, RETICCTPCT in the last 72 hours. Sepsis Labs: No results for input(s): PROCALCITON, LATICACIDVEN in the last 168 hours.  Recent Results (from the past 240 hour(s))  MRSA PCR Screening     Status: None   Collection Time: 10/28/15 12:05 PM  Result Value Ref Range Status   MRSA by PCR NEGATIVE NEGATIVE Final    Comment:        The GeneXpert MRSA Assay (FDA approved for NASAL specimens only), is one component of a comprehensive MRSA colonization surveillance program. It is not intended to diagnose MRSA infection nor to guide or monitor treatment for MRSA infections.          Radiology Studies: Dg Abd Acute W/chest  Result Date: 10/28/2015 CLINICAL DATA:  Emesis. Recent small bowel obstruction. Evaluate for pneumonia. EXAM: DG ABDOMEN ACUTE W/ 1V CHEST COMPARISON:  Chest radiograph 10/07/2015. CT abdomen/ pelvis 10/01/2015 FINDINGS: Stable cardiomegaly. Stable interstitial and reticular prominence. Reticulonodular opacities at the lung bases, may be calcified, unchanged. No new focal airspace disease. No evidence of free intra-abdominal air. Dilated small bowel loops in the central abdomen measures up to 3.8 cm. Multiple serpiginous and irregular linear opacities projecting over the upper abdomen are likely external to the patient. There are cholecystectomy clips. Post aorta bi-iliac stenting. Unchanged osseous structures. IMPRESSION: 1. Air-filled dilated loops of small bowel in the central abdomen, similar to prior exam. Recurrent small bowel obstruction not excluded. 2. Suspected overlying artifact partially obscuring evaluation of the abdomen. 3. Chronic changes in the lungs with lower lobe predominant reticulonodular opacities that may be calcified. No confluent consolidation to suggest pneumonia. Electronically Signed   By: Rubye OaksMelanie  Ehinger M.D.   On: 10/28/2015 06:50   Dg Abd Portable  1v  Result Date: 10/29/2015 CLINICAL DATA:  Recent small bowel obstruction EXAM: PORTABLE ABDOMEN - 1 VIEW COMPARISON:  October 28, 2015 abdominal radiographs; CT abdomen and pelvis October 01, 2015 FINDINGS: There is currently no appreciable bowel dilatation or air-fluid level to suggest bowel obstruction. No evident free air. There is a stent in the aorta and common iliac arteries. There is a filter in the inferior vena cava with the apex of the filter at L3-4, stable. There are surgical clips in the right upper quadrant region. Visualized lung bases appear clear. IMPRESSION: Extensive postoperative change. The bowel gas pattern is unremarkable. No demonstrable bowel obstruction or free air. Electronically Signed   By: Bretta BangWilliam  Woodruff III  M.D.   On: 10/29/2015 07:14        Scheduled Meds: . apixaban  2.5 mg Oral BID  . atorvastatin  20 mg Oral Daily  . diltiazem  360 mg Oral Daily  . levETIRAcetam  500 mg Intravenous Q12H  . metoprolol  100 mg Oral BID   Continuous Infusions: . 0.9 % NaCl with KCl 40 mEq / L 75 mL/hr at 10/29/15 1200     LOS: 1 day    Time spent: 25 minutes.     Kathlen Mody, MD [facility] Pager [phone]  If 7PM-7AM, please contact night-coverage [email or web address] Password [email or web address] 10/29/2015, 5:44 PM

## 2015-10-29 NOTE — NC FL2 (Signed)
[place] MEDICAID FL2 LEVEL OF CARE SCREENING TOOL     IDENTIFICATION  Patient Name: Brandi Heath Birthdate: Jan 24, 1927 Sex: female Admission Date (Current Location): 10/28/2015  [place]  [place] [id number] T Facility and Address[facility]  [facility],  [address], [place] [id number]      Provider Number: [id number]  Attending Physician Name and Address:  Kathlen ModyVijaya Akula, MD  Relative Name and Phone Number:       Current Level of Care: Hospital Recommended Level of Care: Skilled Nursing Facility Prior Approval Number:    Date Approved/Denied:   PASRR Number: [id number]  Discharge Plan: SNF    Current Diagnoses: Patient Active Problem List   Diagnosis Date Noted  . Atrial fibrillation with RVR (HCC) 10/28/2015  . Acute respiratory failure with hypoxia (HCC) 10/17/2015  . Slurred speech   . Left sided numbness   . Acute on chronic diastolic heart failure (HCC) 10/04/2015  . Normocytic anemia 10/04/2015  . Thrombocytopenia (HCC) 10/04/2015  . TIA (transient ischemic attack) 10/04/2015  . Cough 10/02/2015  . SBO (small bowel obstruction) 10/01/2015  . Chronic CHF (congestive heart failure) (HCC) 09/29/2015  . Venous insufficiency (chronic) (peripheral) 09/29/2015  . Vitamin B12 deficiency 09/29/2015  . Anemia, chronic disease 09/29/2015  . Enterococcus UTI 09/02/2015  . GERD (gastroesophageal reflux disease) 09/02/2015  . Adynamic ileus (HCC) 06/11/2015  . CHF, acute on chronic (HCC) 06/11/2015  . Personal history of DVT (deep vein thrombosis) 06/11/2015  . Nausea with vomiting 06/03/2015  . Jaw pain 05/27/2015  . E. coli UTI 04/22/2015  . Prerenal acute renal failure (HCC) 04/22/2015  . History of CVA (cerebrovascular accident) 04/22/2015  . AF (atrial fibrillation) (HCC) 04/22/2015  . Polyneuropathy (HCC) 04/22/2015  . Edema 01/25/2015  . Atrial fibrillation with rapid ventricular response (HCC) 01/20/2015  . Acute encephalopathy  01/20/2015  . UTI (urinary tract infection) 01/20/2015  . Tracheobronchomalacia 01/20/2015  . Personal history of PE (pulmonary embolism) 01/20/2015  . Depression 01/20/2015  . OSA (obstructive sleep apnea)   . Dementia with behavioral disturbance   . Seizures (HCC)   . Hyperlipidemia   . Hypertensive heart disease with CHF (congestive heart failure) (HCC)   . Clotting disorder (HCC)   . Cerebrovascular accident (CVA) due to thrombosis (HCC)     Orientation RESPIRATION BLADDER Height & Weight     Self, Place, Time, Situation  Normal Incontinent Weight: 150 lb 2.1 oz (68.1 kg) Height:  5\' 7"  (170.2 cm)  BEHAVIORAL SYMPTOMS/MOOD NEUROLOGICAL BOWEL NUTRITION STATUS      Incontinent Diet (clear liquid)  AMBULATORY STATUS COMMUNICATION OF NEEDS Skin   Extensive Assist Verbally Normal                       Personal Care Assistance Level of Assistance  Bathing, Feeding, Dressing Bathing Assistance: Limited assistance Feeding assistance: Limited assistance Dressing Assistance: Limited assistance     Functional Limitations Info    Sight Info: Adequate Hearing Info: Impaired Speech Info: Adequate    SPECIAL CARE FACTORS FREQUENCY  PT (By licensed PT), OT (By licensed OT)     PT Frequency: 5 OT Frequency: 5            Contractures Contractures Info: Not present    Additional Factors Info  Code Status, Allergies Code Status Info: DNR Allergies Info: Chocolate, Lactose Intolerance (Gi), Penicillins           Current Medications (10/29/2015):  This is the current hospital  active medication list Current Facility-Administered Medications  Medication Dose Route Frequency Provider Last Rate Last Dose  . 0.9 % NaCl with KCl 40 mEq / L  infusion   Intravenous Continuous Starleen Arms, MD 75 mL/hr at 10/29/15 1200    . acetaminophen (TYLENOL) tablet 650 mg  650 mg Oral Q4H PRN Starleen Arms, MD      . apixaban Everlene Balls) tablet 2.5 mg  2.5 mg Oral BID Janetta Hora, PA-C   2.5 mg at 10/29/15 1109  . atorvastatin (LIPITOR) tablet 20 mg  20 mg Oral Daily Starleen Arms, MD   20 mg at 10/29/15 1107  . diltiazem (TIAZAC) 24 hr capsule 360 mg  360 mg Oral Daily Janetta Hora, PA-C   360 mg at 10/29/15 1107  . levETIRAcetam (KEPPRA) 500 mg in sodium chloride 0.9 % 100 mL IVPB  500 mg Intravenous Q12H Starleen Arms, MD 420 mL/hr at 10/29/15 0928 500 mg at 10/29/15 0928  . metoprolol tartrate (LOPRESSOR) tablet 100 mg  100 mg Oral BID Janetta Hora, PA-C   100 mg at 10/29/15 1108  . ondansetron (ZOFRAN) injection 4 mg  4 mg Intravenous Q6H PRN Starleen Arms, MD         Discharge Medications: Please see discharge summary for a list of discharge medications.  Relevant Imaging Results:  Relevant Lab Results:   Additional Information SSN:  [id number]  Arlyss Repress, LCSW

## 2015-10-29 NOTE — Care Management Note (Signed)
Case Management Note  Patient Details  Name: Brandi Heath MRN: [id number] Date of Birth: [birth year]  Subjective/Objective:      A.fib              Action/Plan: snf   Expected Discharge Date:  11/01/15               Expected Discharge Plan:  Skilled Nursing Facility  In-House Referral:  Clinical Social Work  Discharge planning Services     Post Acute Care Choice:    Choice offered to:     DME Arranged:    DME Agency:     HH Arranged:    HH Agency:     Status of Service:  In process, will continue to follow  If discussed at Long Length of Stay Meetings, dates discussed:    Additional Comments: Date:  October 29, 2015 Chart reviewed for concurrent status and case management needs. Will continue to follow the patient for status change: Discharge Planning: following for needs Expected discharge date: [id number] Marcelle Smiling, BSN, [place], [place]   [phone] Golda Acre, RN 10/29/2015, 9:58 AM

## 2015-10-29 NOTE — Clinical Social Work Placement (Signed)
CSW received call from patient's daughter, Babette Relic (cell#: [phone]) requesting that patient's information be sent to [facility] to see if they would be able to offer a bed for patient. CSW sent information & left voicemail for Toniann Fail at [facility] - awaiting call back. CSW also informed Nikki at [facility] that if [facility] is unable to take patient, that family is requesting that she return to [facility].   Patient recently admitted & discharged to [facility], see psychsocial assessment from 10/04/15.  Lincoln Maxin, LCSW [facility] Clinical Social Worker cell #: [phone]   CLINICAL SOCIAL WORK PLACEMENT  NOTE  Date:  10/29/2015  Patient Details  Name: Brandi Heath MRN: [id number] Date of Birth: 1927/06/26  Clinical Social Work is seeking post-discharge placement for this patient at the Skilled  Nursing Facility level of care (*CSW will initial, date and re-position this form in  chart as items are completed):  Yes   Patient/family provided with [facility] Clinical Social Work Department's list of facilities offering this level of care within the geographic area requested by the patient (or if unable, by the patient's family).  Yes   Patient/family informed of their freedom to choose among providers that offer the needed level of care, that participate in Medicare, Medicaid or managed care program needed by the patient, have an available bed and are willing to accept the patient.  Yes   Patient/family informed of 's ownership interest in [facility] and [facility], as well as of the fact that they are under no obligation to receive care at these facilities.  PASRR submitted to EDS on       PASRR number received on       Existing PASRR number confirmed on 10/29/15     FL2 transmitted to all facilities in geographic area requested by pt/family on 10/29/15     FL2 transmitted to all facilities within larger geographic area  on       Patient informed that his/her managed care company has contracts with or will negotiate with certain facilities, including the following:            Patient/family informed of bed offers received.  Patient chooses bed at       Physician recommends and patient chooses bed at      Patient to be transferred to   on  .  Patient to be transferred to facility by       Patient family notified on   of transfer.  Name of family member notified:        PHYSICIAN       Additional Comment:    _______________________________________________ Arlyss Repress, LCSW 10/29/2015, 2:45 PM

## 2015-10-30 DIAGNOSIS — R112 Nausea with vomiting, unspecified: Secondary | ICD-10-CM

## 2015-10-30 DIAGNOSIS — F0151 Vascular dementia with behavioral disturbance: Secondary | ICD-10-CM

## 2015-10-30 LAB — BASIC METABOLIC PANEL
ANION GAP: 3 — AB (ref 5–15)
BUN: 9 mg/dL (ref 6–20)
BUN: 9 mg/dL (ref 4–21)
CALCIUM: 8.9 mg/dL (ref 8.9–10.3)
CO2: 25 mmol/L (ref 22–32)
CREATININE: 0.8 mg/dL (ref 0.5–1.1)
Chloride: 114 mmol/L — ABNORMAL HIGH (ref 101–111)
Creatinine, Ser: 0.81 mg/dL (ref 0.44–1.00)
Glucose, Bld: 95 mg/dL (ref 65–99)
Glucose: 95 mg/dL
Potassium: 4.8 mmol/L (ref 3.5–5.1)
SODIUM: 142 mmol/L (ref 135–145)
Sodium: 142 mmol/L (ref 137–147)

## 2015-10-30 MED ORDER — DILTIAZEM HCL ER COATED BEADS 180 MG PO CP24
360.0000 mg | ORAL_CAPSULE | Freq: Every day | ORAL | Status: DC
  Administered 2015-10-30 – 2015-10-31 (×2): 360 mg via ORAL
  Filled 2015-10-30 (×2): qty 2

## 2015-10-30 MED ORDER — LEVETIRACETAM 500 MG PO TABS
500.0000 mg | ORAL_TABLET | Freq: Two times a day (BID) | ORAL | Status: DC
  Administered 2015-10-30 – 2015-10-31 (×2): 500 mg via ORAL
  Filled 2015-10-30 (×2): qty 1

## 2015-10-30 NOTE — Progress Notes (Signed)
Patient Name: Brandi Heath Date of Encounter: 10/30/2015  Primary Cardiologist: [facility] ([facility]. Hanley Haysosario, [facility]   [facility]     Active Problems:   Dementia with behavioral disturbance   Seizures (HCC)   Personal history of PE (pulmonary embolism)   Depression   History of CVA (cerebrovascular accident)   Personal history of DVT (deep vein thrombosis)   GERD (gastroesophageal reflux disease)   Chronic CHF (congestive heart failure) (HCC)   SBO (small bowel obstruction)   Atrial fibrillation with RVR (HCC)     Subjective   Resting comfortably. Nausea much better. No CP, some orthopnea which she says is chronic. No dizziness or weakness.  Inpatient Medications    Scheduled Meds: . apixaban  2.5 mg Oral BID  . atorvastatin  20 mg Oral Daily  . diltiazem  360 mg Oral Daily  . levETIRAcetam  500 mg Intravenous Q12H  . metoprolol  100 mg Oral BID   Continuous Infusions: . 0.9 % NaCl with KCl 40 mEq / L 75 mL/hr at 10/29/15 1200   PRN Meds: acetaminophen, ondansetron (ZOFRAN) IV   Vital Signs    Vitals:   10/29/15 1200 10/29/15 1330 10/29/15 2034 10/30/15 0502  BP: 123/72 101/62 102/66 113/63  Pulse: 72 (!) 58 74 (!) 59  Resp: (!) 21 18 18    Temp:  98.4 F (36.9 C) 98.7 F (37.1 C) 98.6 F (37 C)  TempSrc:  Oral Oral Oral  SpO2: 91% 100% 96% 98%  Weight:      Height:        Intake/Output Summary (Last 24 hours) at 10/30/15 0747 Last data filed at 10/30/15 0600  Gross per 24 hour  Intake           2309.5 ml  Output                0 ml  Net           2309.5 ml   Filed Weights   10/28/15 0339 10/28/15 1200  Weight: 155 lb (70.3 kg) 150 lb 2.1 oz (68.1 kg)    Physical Exam   GEN: Well nourished, well developed, in no acute distress. Elderly and frail HEENT: Grossly normal.  Neck: Supple, no JVD, carotid bruits, or masses. Cardiac: irreg irreg, systolic murmur at apex, no murmurs, rubs, or gallops. No clubbing, cyanosis, Trace  bilateral LE edema.  Radials/DP/PT 2+ and equal bilaterally.  Respiratory:  Respirations regular and unlabored, clear to auscultation bilaterally. GI: Soft, nontender, nondistended, BS + x 4. MS: no deformity or atrophy. Skin: warm and dry, no rash. Neuro:  Strength and sensation are intact. Psych: AAOx3.  Normal affect.  Labs    CBC  Recent Labs  10/28/15 0440 10/29/15 0403  WBC 7.5 4.2  HGB 11.6* 9.0*  HCT 32.7* 26.4*  MCV 83.6 84.6  PLT 181 158   Basic Metabolic Panel  Recent Labs  10/28/15 1845 10/29/15 0403 10/30/15 0435  NA  --  142 142  K  --  3.9 4.8  CL  --  111 114*  CO2  --  25 25  GLUCOSE  --  95 95  BUN  --  14 9  CREATININE  --  0.93 0.81  CALCIUM  --  8.8* 8.9  MG 1.5* 1.5*  --    Liver Function Tests  Recent Labs  10/28/15 0440  AST 17  ALT 13*  ALKPHOS 110  BILITOT 0.9  PROT 7.1  ALBUMIN 3.5  Recent Labs  10/28/15 0440  LIPASE 23   Cardiac Enzymes  Recent Labs  10/28/15 1845  TROPONINI <0.03   BNP Invalid input(s): POCBNP D-Dimer No results for input(s): DDIMER in the last 72 hours. Hemoglobin A1C No results for input(s): HGBA1C in the last 72 hours. Fasting Lipid Panel No results for input(s): CHOL, HDL, LDLCALC, TRIG, CHOLHDL, LDLDIRECT in the last 72 hours. Thyroid Function Tests No results for input(s): TSH, T4TOTAL, T3FREE, THYROIDAB in the last 72 hours.  Invalid input(s): FREET3  Telemetry    afib with some slow ventricular response and PVCs- Personally Reviewed  ECG    Atrial fibrillation with RVR HR 142 - Personally Reviewed  Radiology    Dg Abd Portable 1v  Result Date: 10/29/2015 CLINICAL DATA:  Recent small bowel obstruction EXAM: PORTABLE ABDOMEN - 1 VIEW COMPARISON:  October 28, 2015 abdominal radiographs; CT abdomen and pelvis October 01, 2015 FINDINGS: There is currently no appreciable bowel dilatation or air-fluid level to suggest bowel obstruction. No evident free air. There is a stent in  the aorta and common iliac arteries. There is a filter in the inferior vena cava with the apex of the filter at L3-4, stable. There are surgical clips in the right upper quadrant region. Visualized lung bases appear clear. IMPRESSION: Extensive postoperative change. The bowel gas pattern is unremarkable. No demonstrable bowel obstruction or free air. Electronically Signed   By: Bretta Bang III M.D.   On: 10/29/2015 07:14    Cardiac Studies   2D ECHO: 10/06/2015 LV EF: 40% -   45% Study Conclusions - Left ventricle: The cavity size was normal. Systolic function was   mildly to moderately reduced. The estimated ejection fraction was   in the range of 40% to 45%. Wall motion was normal; there were no   regional wall motion abnormalities. The study was not technically   sufficient to allow evaluation of LV diastolic dysfunction due to   atrial fibrillation. - Aortic valve: Trileaflet; moderately thickened, mildly calcified   leaflets. There was mild regurgitation. - Mitral valve: Mild diffuse calcification of the anterior leaflet.   There was moderate to severe regurgitation. - Left atrium: The atrium was severely dilated. - Right ventricle: Systolic function was mildly reduced. - Right atrium: There is a mobile thin density stranded density in   the RA that could represent chiari but in setting of TIA and   interatrial septal aneurysm, recommend TEE for further   evaluation. The atrium was moderately dilated. 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 No thrombus was   identified within the aneurysm. - Tricuspid valve: There was mild-moderate regurgitation. - Pulmonary arteries: PA peak pressure: 63 mm Hg (S). Impressions: - The right ventricular systolic pressure was increased consistent   with moderate pulmonary hypertension. Recommendations:  Consider transesophageal echocardiography if clinically indicated  in order to exclude intracardiac thrombus as well as further assess degree of mitral regurgitation.  Patient Profile      Shaquana Buel is a 82 y.o. female nursing home resident, with h/o dementia, chronic atrial fibrillation on chronic anticoagulation, LV dysfunction (EF 40-45%), pulm HTN, mod-severe MR, h/o PE/DVT, stroke, seizure disorder and recent SBO who presented to [facility] on[facility] with n/v. Cardiology consulted for atrial fibrillation w/ RVR.    Assessment & Plan    1.  Persistent atrial fibrillation: though the duration of her afib is not clearly known, she was in afib on her  hospitalization in 09/2015. Her echo favors permanent afib. Per Dr. Johney Frame, attempts to restore NSR would likely be unsuccessful. Long term rate control is appropriate.    -- Chads2vasc score is 8. She has been restarted on Eliquis 2.5mg  BID. -- Rate well controlled on home diltiazem CD 360mg  daily and metoprolol 100mg  BID. She does have some bradycardia with HR in 40s. She is asymptomatic and she is back on her home dosing which she had previously done well on. Will keep med dosing the same at this time.   2. Nonischemic CM/ chronic systolic dysfunction: caution with IVF. Appears euvolemic currently.   3. SBO: Per primary team  4. Dementia: Stable. No change required today  5. Mod-Severe MR: no further work up at this point. Follow clinically.   6. Anemia: Hg dropped from 11.6--> 9.0. Likely hemodilutional 2/2 IVF  7. Dispo: Conservative measures would seem appropriate.  She is appropriately DNR.   Signed, Cline Crock, PA-C  10/30/2015, 7:47 AM   Personally seen and examined. Agree with above.  AFIB well controlled - permanent.  Keep current meds. (transient low HR during sleep - no changes - but for most part normal rate control).   Pleasant, Irreg irreg, CTAB.   Will sign off. Please call if any questions.   Donato Schultz, MD

## 2015-10-30 NOTE — Progress Notes (Signed)
PROGRESS NOTE    Brandi Heath  ZHY:[id number] DOB: 13-Sep-1927 DOA: 10/28/2015 PCP: Merrilee SeashoreAnne Alexander, MD    Brief Narrative:  Brandi OraRuth Coomes  is a 82 y.o. female, with a past medical history of dementia, previous episodes of small bowel obstruction, atrial fibrillation on anticoagulation, history of PE and DVT, history of stroke, history of seizure disorder, who presents with complaints of nausea and vomiting overnight, was found to have partial SBO and afib with RVR.   Assessment & Plan:   Active Problems:   Dementia with behavioral disturbance   Seizures (HCC)   Personal history of PE (pulmonary embolism)   Depression   History of CVA (cerebrovascular accident)   Personal history of DVT (deep vein thrombosis)   GERD (gastroesophageal reflux disease)   Chronic CHF (congestive heart failure) (HCC)   SBO (small bowel obstruction)   Atrial fibrillation with RVR (HCC)   SBO: Improved, started on clear liquid diet, KUB shows improvement in SBO.  Repleted electrolytes. Advanced diet to full liquid diet. Advance diet as tolerated.    Afib with RVR: initially started on GTT cardizem, transitioned to po cardizem. Resume metoprolol.  Cardiology consulted and recommendations given.   Seizures: On keppra. Changed to po .   H/o DVT and PE: ON eliquis at home.    DVT prophylaxis: eliquis.  Code Status: DNR Family Communication: NONE  At bedside.  Disposition Plan: pending PT eval.    Consultants:   Cardiology  Surgery over the phone.    Procedures: none.    Antimicrobials: none.    Subjective: No new complaints.  Objective: Vitals:   10/29/15 2034 10/30/15 0502 10/30/15 0800 10/30/15 1439  BP: 102/66 113/63 (!) 117/48 124/71  Pulse: 74 (!) 59 62 74  Resp: 18  (!) 26 (!) 21  Temp: 98.7 F (37.1 C) 98.6 F (37 C) 98.5 F (36.9 C) 99.2 F (37.3 C)  TempSrc: Oral Oral Oral Oral  SpO2: 96% 98% 99% 100%  Weight:      Height:        Intake/Output Summary (Last 24  hours) at 10/30/15 1936 Last data filed at 10/30/15 1849  Gross per 24 hour  Intake             1695 ml  Output                0 ml  Net             1695 ml   Filed Weights   10/28/15 0339 10/28/15 1200  Weight: 70.3 kg (155 lb) 68.1 kg (150 lb 2.1 oz)    Examination:  General exam: Appears calm and comfortable  Respiratory system: Clear to auscultation. Respiratory effort normal. Cardiovascular system: S1 & S2 heard, irregular.  Gastrointestinal system: Abdomen is nondistended, soft and nontender. No organomegaly or masses felt. Normal bowel sounds heard. Central nervous system: Alert and slightly confused. . No focal neurological deficits. Extremities: Symmetric 5 x 5 power. Skin: No rashes, lesions or ulcers     Data Reviewed: I have personally reviewed following labs and imaging studies  CBC:  Recent Labs Lab 10/28/15 0440 10/29/15 0403  WBC 7.5 4.2  HGB 11.6* 9.0*  HCT 32.7* 26.4*  MCV 83.6 84.6  PLT 181 158   Basic Metabolic Panel:  Recent Labs Lab 10/28/15 0440 10/28/15 1845 10/29/15 0403 10/30/15 0435  NA 140  --  142 142  K 3.5  --  3.9 4.8  CL 105  --  111 114*  CO2 26  --  25 25  GLUCOSE 195*  --  95 95  BUN 17  --  14 9  CREATININE 0.97  --  0.93 0.81  CALCIUM 9.5  --  8.8* 8.9  MG  --  1.5* 1.5*  --    GFR: Estimated Creatinine Clearance: 48.5 mL/min (by C-G formula based on SCr of 0.81 mg/dL). Liver Function Tests:  Recent Labs Lab 10/28/15 0440  AST 17  ALT 13*  ALKPHOS 110  BILITOT 0.9  PROT 7.1  ALBUMIN 3.5    Recent Labs Lab 10/28/15 0440  LIPASE 23   No results for input(s): AMMONIA in the last 168 hours. Coagulation Profile:  Recent Labs Lab 10/28/15 1845  INR 1.21   Cardiac Enzymes:  Recent Labs Lab 10/28/15 1845  TROPONINI <0.03   BNP (last 3 results) No results for input(s): PROBNP in the last 8760 hours. HbA1C: No results for input(s): HGBA1C in the last 72 hours. CBG: No results for input(s):  GLUCAP in the last 168 hours. Lipid Profile: No results for input(s): CHOL, HDL, LDLCALC, TRIG, CHOLHDL, LDLDIRECT in the last 72 hours. Thyroid Function Tests: No results for input(s): TSH, T4TOTAL, FREET4, T3FREE, THYROIDAB in the last 72 hours. Anemia Panel: No results for input(s): VITAMINB12, FOLATE, FERRITIN, TIBC, IRON, RETICCTPCT in the last 72 hours. Sepsis Labs: No results for input(s): PROCALCITON, LATICACIDVEN in the last 168 hours.  Recent Results (from the past 240 hour(s))  MRSA PCR Screening     Status: None   Collection Time: 10/28/15 12:05 PM  Result Value Ref Range Status   MRSA by PCR NEGATIVE NEGATIVE Final    Comment:        The GeneXpert MRSA Assay (FDA approved for NASAL specimens only), is one component of a comprehensive MRSA colonization surveillance program. It is not intended to diagnose MRSA infection nor to guide or monitor treatment for MRSA infections.          Radiology Studies: Dg Abd Portable 1v  Result Date: 10/29/2015 CLINICAL DATA:  Recent small bowel obstruction EXAM: PORTABLE ABDOMEN - 1 VIEW COMPARISON:  October 28, 2015 abdominal radiographs; CT abdomen and pelvis October 01, 2015 FINDINGS: There is currently no appreciable bowel dilatation or air-fluid level to suggest bowel obstruction. No evident free air. There is a stent in the aorta and common iliac arteries. There is a filter in the inferior vena cava with the apex of the filter at L3-4, stable. There are surgical clips in the right upper quadrant region. Visualized lung bases appear clear. IMPRESSION: Extensive postoperative change. The bowel gas pattern is unremarkable. No demonstrable bowel obstruction or free air. Electronically Signed   By: Bretta Bang III M.D.   On: 10/29/2015 07:14        Scheduled Meds: . apixaban  2.5 mg Oral BID  . atorvastatin  20 mg Oral Daily  . diltiazem  360 mg Oral Daily  . levETIRAcetam  500 mg Intravenous Q12H  . metoprolol   100 mg Oral BID   Continuous Infusions: . 0.9 % NaCl with KCl 40 mEq / L 75 mL/hr at 10/29/15 1200     LOS: 2 days    Time spent: 25 minutes.     Kathlen Mody, MD [facility] Pager [phone]  If 7PM-7AM, please contact night-coverage [email or web address] Password [facility] 10/30/2015, 7:36 PM

## 2015-10-31 NOTE — Care Management Note (Signed)
Case Management Note  Patient Details  Name: Brandi Heath MRN: [id number] Date of Birth: 04-20-1927  Subjective/Objective:Advancing diet. From SNF-CSW following for return.                    Action/Plan:d/c SNF.   Expected Discharge Date:                Expected Discharge Plan:  Skilled Nursing Facility  In-House Referral:  Clinical Social Work  Discharge planning Services     Post Acute Care Choice:    Choice offered to:     DME Arranged:    DME Agency:     HH Arranged:    HH Agency:     Status of Service:  In process, will continue to follow  If discussed at Long Length of Stay Meetings, dates discussed:    Additional Comments:  Lanier Clam, RN 10/31/2015, 12:52 PM

## 2015-10-31 NOTE — Clinical Social Work Placement (Signed)
Patient is set to discharge back to [facility] today. Patient & daughter, Tammy aware. Discharge packet given to RN, Darcel Bayley. [facility] called for transport.     Lincoln Maxin, LCSW [facility] Clinical Social Worker cell #: [phone]    CLINICAL SOCIAL WORK PLACEMENT  NOTE  Date:  10/31/2015  Patient Details  Name: Brandi Heath MRN: [id number] Date of Birth: [birth year]  Clinical Social Work is seeking post-discharge placement for this patient at the Skilled  Nursing Facility level of care (*CSW will initial, date and re-position this form in  chart as items are completed):  Yes   Patient/family provided with [facility] Clinical Social Work Department's list of facilities offering this level of care within the geographic area requested by the patient (or if unable, by the patient's family).  Yes   Patient/family informed of their freedom to choose among providers that offer the needed level of care, that participate in Medicare, Medicaid or managed care program needed by the patient, have an available bed and are willing to accept the patient.  Yes   Patient/family informed of [facility]'s ownership interest in [facility] and [facility], as well as of the fact that they are under no obligation to receive care at these facilities.  PASRR submitted to EDS on       PASRR number received on       Existing PASRR number confirmed on 10/29/15     FL2 transmitted to all facilities in geographic area requested by pt/family on 10/29/15     FL2 transmitted to all facilities within larger geographic area on       Patient informed that his/her managed care company has contracts with or will negotiate with certain facilities, including the following:        Yes   Patient/family informed of bed offers received.  Patient chooses bed at [facility]     Physician recommends and patient chooses bed at      Patient to be transferred to [facility] on 10/31/15.  Patient to be transferred to facility by PT[facility]     Patient family notified on 10/31/15 of transfer.  Name of family member notified:  patient's daughter, Tammy via phone     PHYSICIAN       Additional Comment:    _______________________________________________ Arlyss Repress, LCSW 10/31/2015, 1:14 PM

## 2015-10-31 NOTE — Evaluation (Signed)
Physical Therapy Evaluation Patient Details Name: Brandi Heath MRN: [id number] DOB: Nov 28, 1927 Today's Date: 10/31/2015   History of Present Illness  82 y.o. female, with a past medical history of dementia, previous episodes of small bowel obstruction, atrial fibrillation on anticoagulation, history of PE and DVT, history of stroke, history of seizure disorder, admitted with partial SBO and afib with RVR  Clinical Impression  Pt admitted with above diagnosis. Pt currently with functional limitations due to the deficits listed below (see PT Problem List).  Pt will benefit from skilled PT to increase their independence and safety with mobility to allow discharge to the venue listed below.  Pt requiring increased assist for transfers.  Recommend SNF upon d/c.     Follow Up Recommendations SNF    Equipment Recommendations  None recommended by PT    Recommendations for Other Services       Precautions / Restrictions Precautions Precautions: Fall Precaution Comments: incontinent Restrictions Weight Bearing Restrictions: No      Mobility  Bed Mobility Overal bed mobility: Needs Assistance Bed Mobility: Supine to Sit     Supine to sit: Min assist     General bed mobility comments: multimodal cues for pt to self assist, required slight assist for trunk upright  Transfers Overall transfer level: Needs assistance Equipment used: Rolling walker (2 wheeled) Transfers: Sit to/from [employer];Stand Pivot Transfers Sit to Stand: Mod assist;+2 physical assistance Stand pivot transfers: Mod assist;+2 physical assistance       General transfer comment: multimodal cues for technique and safety, assist to rise, steady, and control descent, tends to lean against surface to self assist with rise, [facility] to recliner  Ambulation/Gait                Stairs            Wheelchair Mobility    Modified Rankin (Stroke Patients Only)       Balance Overall balance assessment:  Needs assistance         Standing balance support: Bilateral upper extremity supported;During functional activity Standing balance-Leahy Scale: Zero                               Pertinent Vitals/Pain Pain Assessment: No/denies pain    Home Living Family/patient expects to be discharged to:: Skilled nursing facility                      Prior Function           Comments: reports she is able to transfer with RW to w/c     Hand Dominance        Extremity/Trunk Assessment               Lower Extremity Assessment: Generalized weakness;RLE deficits/detail RLE Deficits / Details: edematous R LE, RN aware       Communication   Communication: No difficulties  Cognition Arousal/Alertness: Awake/alert Behavior During Therapy: WFL for tasks assessed/performed Overall Cognitive Status: History of cognitive impairments - at baseline                      General Comments      Exercises     Assessment/Plan    PT Assessment Patient needs continued PT services  PT Problem List Decreased activity tolerance;Decreased strength;Decreased balance;Decreased cognition;Decreased mobility;Decreased knowledge of use of DME          PT Treatment  Interventions DME instruction;Gait training;Functional mobility training;Therapeutic exercise;Therapeutic activities;Patient/family education    PT Goals (Current goals can be found in the Care Plan section)  Acute Rehab PT Goals PT Goal Formulation: With patient Time For Goal Achievement: 11/07/15 Potential to Achieve Goals: Good    Frequency Min 2X/week   Barriers to discharge        Co-evaluation               End of Session Equipment Utilized During Treatment: Gait belt Activity Tolerance: Patient limited by fatigue Patient left: in chair;with call bell/phone within reach;with chair alarm set;with nursing/sitter in room Nurse Communication: Mobility status         Time:  1610-9604 PT Time Calculation (min) (ACUTE ONLY): 21 min   Charges:   PT Evaluation $PT Eval Low Complexity: 1 Procedure     PT G Codes:        Lajuan Godbee,KATHrine E 10/31/2015, 11:00 AM Zenovia Jarred, PT, DPT 10/31/2015 Pager: [phone]

## 2015-10-31 NOTE — Progress Notes (Signed)
Report give to [place], Charity fundraiser at [employer].

## 2015-10-31 NOTE — Discharge Summary (Signed)
Physician Discharge Summary  Brandi Heath ZOX:[id number] DOB: 1927-04-22 DOA: 10/28/2015  PCP: Merrilee Seashore, MD  Admit date: 10/28/2015 Discharge date: 10/31/2015  Admitted From:Skilled nursing facility Disposition:  Skilled nursing facility (adult sons)  Recommendations for Outpatient Follow-up:  1. Follow-up with M.D. at SNF in 1 week.   Equipment/Devices: Per therapy at the facility  Discharge Condition: Fair CODE STATUS: DO NOT RESUSCITATE Diet recommendation: Soft, advanced a heart healthy in the next 48 hours.    Discharge Diagnoses:  Principal Problem:   Atrial fibrillation with RVR (HCC)   Active Problems:   Partial SBO (small bowel obstruction)   Dementia with behavioral disturbance   Seizures (HCC)   Personal history of PE (pulmonary embolism)   Depression   History of CVA (cerebrovascular accident)   Personal history of DVT (deep vein thrombosis)   GERD (gastroesophageal reflux disease)   Chronic systolic CHF (congestive heart failure) (HCC)  Brief narrative Please refer to admission H&P for details, in brief, 82 year old female with history of mild dementia, A. fib, history of DVT and PE on chronic anticoagulation, history of stroke, seizure disorder, recent hospitalization for  small bowel obstruction presented with nausea and vomiting of one day duration and found to have partial small bowel obstruction along with A. fib with RVR.  Hospital course Partial small bowel obstruction Improved with conservative management. Follow-up KUB showed improvement. Diet slowly advanced to soft, tolerating well. Electrolytes repleted.  A. fib with RVR Required Cardizem drip initially, then transitioned to oral Cardizem at home dose. Resume metoprolol at home dose. As per cardiology attempt to restore normal sinus rhythm would likely be unsuccessful and recommend long-term rate control. CHADS2vasc score of 8, continue Eluiquis. Recent echo with EF of 50-55%. Heart rate  well controlled on the monitor. Patient stable to be discharged back to skilled nursing on her home medication regimen.  Dementia Mild, at baseline.  Nonischemic cardiomyopathy Euvolemic. Received gentle hydration.  Moderate to severe MR  History of stroke Continue anticoagulation and statin. On Keppra for seizure prevention.  Protein calorie malnutrition Continue supplement.  Consults: Cardiology  Disposition: Return to skilled nursing facility  Family communication: No family at bedside  Discharge Instructions     Medication List    STOP taking these medications   enoxaparin 100 MG/ML injection Commonly known as:  LOVENOX     TAKE these medications   atorvastatin 20 MG tablet Commonly known as:  LIPITOR Take 20 mg by mouth at bedtime.   calcium-vitamin D 500-200 MG-UNIT tablet Take 1 tablet by mouth daily.   diltiazem 360 MG 24 hr capsule Commonly known as:  TIAZAC Take 360 mg by mouth daily.   donepezil 10 MG tablet Commonly known as:  ARICEPT Take 10 mg by mouth at bedtime.   DULoxetine 60 MG capsule Commonly known as:  CYMBALTA Take 60 mg by mouth at bedtime.   ELIQUIS 2.5 MG Tabs tablet Generic drug:  apixaban Take 2.5 mg by mouth 2 (two) times daily.   ENSURE ENLIVE PO Take 1 Can by mouth daily before breakfast.   furosemide 40 MG tablet Commonly known as:  LASIX Take 40 mg by mouth daily.   FUSION PLUS Caps Take 1 capsule by mouth daily.   guaifenesin 100 MG/5ML syrup Commonly known as:  ROBITUSSIN Take 200 mg by mouth daily as needed for cough.   levETIRAcetam 500 MG tablet Commonly known as:  KEPPRA Take 500 mg by mouth 2 (two) times daily.   loperamide 2 MG capsule  Commonly known as:  IMODIUM Take 4 mg by mouth 4 (four) times daily as needed for diarrhea or loose stools.   magnesium oxide 400 (241.3 Mg) MG tablet Commonly known as:  MAG-OX Take 400 mg by mouth daily.   metoprolol 100 MG tablet Commonly known as:   LOPRESSOR Take 1 tablet (100 mg total) by mouth 2 (two) times daily.   multivitamin with minerals tablet Take 1 tablet by mouth daily.   MUSCLE RUB 10-15 % Crea Apply 1 application topically 3 (three) times daily as needed (for leg pain).   omeprazole 40 MG capsule Commonly known as:  PRILOSEC Take 40 mg by mouth 2 (two) times daily.   PHENERGAN 25 MG suppository Generic drug:  promethazine Place 25 mg rectally every 12 (twelve) hours as needed for nausea or vomiting.   polyethylene glycol packet Commonly known as:  MIRALAX / GLYCOLAX Take 17 g by mouth daily.   potassium chloride SA 20 MEQ tablet Commonly known as:  K-DUR,KLOR-CON Take 1 tablet (20 mEq total) by mouth daily.   RISPERDAL 1 MG tablet Generic drug:  risperiDONE Take 1 mg by mouth 2 (two) times daily.   travoprost (benzalkonium) 0.004 % ophthalmic solution Commonly known as:  TRAVATAN Place 1 drop into both eyes at bedtime.   TYLENOL 325 MG tablet Generic drug:  acetaminophen Take 650 mg by mouth every 4 (four) hours as needed for mild pain, moderate pain or headache. pain      Follow-up Information    MD at SNF in 1 week .          Allergies  Allergen Reactions  . Chocolate Diarrhea  . Lactose Intolerance (Gi) Diarrhea  . Penicillins Other (See Comments)    Reaction:  Unknown  Has patient had a PCN reaction causing immediate rash, facial/tongue/throat swelling, SOB or lightheadedness with hypotension: Unsure Has patient had a PCN reaction causing severe rash involving mucus membranes or skin necrosis: Unsure Has patient had a PCN reaction that required hospitalization:  Unsure  Has patient had a PCN reaction occurring within the last 10 years: Unsure If all of the above answers are "NO", then may proceed with Cephalosporin use.      Procedures/Studies: Mr Shirlee LatchMra Head ZOWo Contrast  Result Date: 10/03/2015 CLINICAL DATA:  Code stroke. Acute mental status changes beginning 90 minutes ago.  Lethargy. Left-sided numbness. EXAM: MRI HEAD WITHOUT CONTRAST MRA HEAD WITHOUT CONTRAST TECHNIQUE: Multiplanar, multiecho pulse sequences of the brain and surrounding structures were obtained without intravenous contrast. Angiographic images of the head were obtained using MRA technique without contrast. COMPARISON:  None. FINDINGS: MRI HEAD FINDINGS Brain: Diffusion imaging does not show any acute or subacute infarction. The brainstem is normal. There is cerebellar atrophy. Cerebral hemispheres show generalized age related atrophy with chronic small-vessel ischemic change of the deep white matter. No large vessel territory infarction. No mass lesion, hemorrhage, hydrocephalus or extra-axial collection. No pituitary mass. Vascular: Major vessels are patent at the base of the brain. Skull and upper cervical spine: Negative Sinuses/Orbits: Small mastoid effusions. Other: None significant MRA HEAD FINDINGS Both internal carotid arteries are widely patent through the skullbase. On the left, the anterior and middle cerebral vessels are patent without proximal stenosis, aneurysm or vascular malformation. More distal branch vessels show atherosclerotic irregularity. On the right, the anterior cerebral branches are patent. There is what I believe is artifactual signal loss within the right middle cerebral artery territory related to downward tortuosity. In evaluating the source images, that  vessel appears to be widely patent. Both vertebral arteries are widely patent to the basilar. No basilar stenosis. Posterior circulation branch vessels are normal. IMPRESSION: No acute infarction. Generalized age related atrophy and chronic small vessel change of the cerebral hemispheric deep white matter. Intracranial MR angiography shows patency of the large and medium size vessels. There is artifactual signal loss within the right middle cerebral artery related to downward tortuosity. Evaluation of the source images shows that vessel  to be patent. The patient does have some distal vessel atherosclerotic irregularity. Electronically Signed   By: Paulina Fusi M.D.   On: 10/03/2015 12:15   Mr Brain Wo Contrast  Result Date: 10/03/2015 CLINICAL DATA:  Code stroke. Acute mental status changes beginning 90 minutes ago. Lethargy. Left-sided numbness. EXAM: MRI HEAD WITHOUT CONTRAST MRA HEAD WITHOUT CONTRAST TECHNIQUE: Multiplanar, multiecho pulse sequences of the brain and surrounding structures were obtained without intravenous contrast. Angiographic images of the head were obtained using MRA technique without contrast. COMPARISON:  None. FINDINGS: MRI HEAD FINDINGS Brain: Diffusion imaging does not show any acute or subacute infarction. The brainstem is normal. There is cerebellar atrophy. Cerebral hemispheres show generalized age related atrophy with chronic small-vessel ischemic change of the deep white matter. No large vessel territory infarction. No mass lesion, hemorrhage, hydrocephalus or extra-axial collection. No pituitary mass. Vascular: Major vessels are patent at the base of the brain. Skull and upper cervical spine: Negative Sinuses/Orbits: Small mastoid effusions. Other: None significant MRA HEAD FINDINGS Both internal carotid arteries are widely patent through the skullbase. On the left, the anterior and middle cerebral vessels are patent without proximal stenosis, aneurysm or vascular malformation. More distal branch vessels show atherosclerotic irregularity. On the right, the anterior cerebral branches are patent. There is what I believe is artifactual signal loss within the right middle cerebral artery territory related to downward tortuosity. In evaluating the source images, that vessel appears to be widely patent. Both vertebral arteries are widely patent to the basilar. No basilar stenosis. Posterior circulation branch vessels are normal. IMPRESSION: No acute infarction. Generalized age related atrophy and chronic small vessel  change of the cerebral hemispheric deep white matter. Intracranial MR angiography shows patency of the large and medium size vessels. There is artifactual signal loss within the right middle cerebral artery related to downward tortuosity. Evaluation of the source images shows that vessel to be patent. The patient does have some distal vessel atherosclerotic irregularity. Electronically Signed   By: Paulina Fusi M.D.   On: 10/03/2015 12:15   Ct Abdomen Pelvis W Contrast  Result Date: 10/01/2015 CLINICAL DATA:  Nursing home patient with dementia, presenting with fecal emesis. Evaluate for bowel obstruction. EXAM: CT ABDOMEN AND PELVIS WITH CONTRAST TECHNIQUE: Multidetector CT imaging of the abdomen and pelvis was performed using the standard protocol following bolus administration of intravenous contrast. CONTRAST:  75mL ISOVUE-300 IOPAMIDOL (ISOVUE-300) INJECTION 61% COMPARISON:  None. FINDINGS: Lower chest: The heart is enlarged. There is atherosclerosis of the coronary arteries. No significant pleural or pericardial effusion. No confluent basilar airspace disease. Hepatobiliary: No focal hepatic abnormalities are identified. There is no significant biliary dilatation status post cholecystectomy. Pancreas: Diffusely atrophied. No evidence of pancreatic mass, ductal dilatation or surrounding inflammation. Spleen: Normal in size without focal abnormality. Adrenals/Urinary Tract: Both adrenal glands appear normal. Both kidneys demonstrate cortical thinning. There are cysts in the lower pole of the right kidney, measuring up to 3.2 cm in diameter. No evidence of renal mass, urinary tract calculus or hydronephrosis. The bladder  appears unremarkable for its degree of distention. Stomach/Bowel: Small hiatal hernia. The stomach, proximal and mid small bowel are mildly dilated with multiple air-fluid levels. The distal small bowel is decompressed. The transition point is suspected to be in the false pelvis (axial image  56). No surrounding soft tissue mass or fluid collection is apparent in this area. The colon is decompressed. No apparent fistula or focal extraluminal fluid collection. There is a small amount of free pelvic fluid. Vascular/Lymphatic: There are no enlarged abdominal or pelvic lymph nodes. Patient has an aorto bi-iliac stent graft which appears patent. 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 No evidence of endograft leak. Both internal iliac arteries appear thrombosed status post probable embolization on the right. The external iliac arteries are patent. IVC filter noted with mild strut penetration. Reproductive: Hysterectomy.  No evidence of adnexal mass. Other: There is a small amount of ascites. There is generalized edema throughout the mesenteric and subcutaneous fat. No focal extraluminal fluid collections are seen. Musculoskeletal: No acute or significant osseous findings. The bones are demineralized. There is a possible sacral decubitus ulcer. No evidence of bone destruction. Lower lumbar spondylosis noted. IMPRESSION: 1. Findings are consistent with a mid to distal small bowel obstruction. Transition point appears to be in the false pelvis without discernible cause, suggesting adhesions. 2. No fistula or abscess identified. 3. Generalized soft tissue edema with mild pelvic ascites. 4. Patent aortic stent graft. Electronically Signed   By: Carey Bullocks M.D.   On: 10/01/2015 13:50   Dg Chest Port 1 View  Result Date: 10/07/2015 CLINICAL DATA:  Cough. H/o A-fib, CHF, CAD, HTN, PE, and Stroke. Nonsmoker. EXAM: PORTABLE CHEST 1 VIEW COMPARISON:  10/03/2015 FINDINGS: Midline trachea. Cardiomegaly accentuated by AP portable technique. Right hemidiaphragm elevation. No pleural effusion or pneumothorax. Similar mild pulmonary interstitial prominence. Lower lobe predominant reticular nodular opacities are favored to be calcified. No well-defined lobar  consolidation. IMPRESSION: Cardiomegaly with similar nonspecific pulmonary interstitial thickening. Cannot exclude pulmonary venous congestion. Concurrent lower lobe predominant reticular nodular opacity may represent tiny underlying calcified pulmonary nodules. Likely the sequelae of remote infection or inflammation. No typical findings of acute pneumonia. Electronically Signed   By: Jeronimo Greaves M.D.   On: 10/07/2015 11:40   Dg Chest Port 1 View  Result Date: 10/03/2015 CLINICAL DATA:  Abnormal breath sounds. EXAM: PORTABLE CHEST 1 VIEW COMPARISON:  10/01/2015. FINDINGS: Cardiomegaly with pulmonary vascular prominence. Mild right perihilar interstitial prominence noted. Mild interstitial edema and/or pneumonitis cannot be excluded. Left pleural effusion. No pneumothorax IMPRESSION: 1.  Interim removal of NG tube. 2. Cardiomegaly with mild pulmonary venous congestion. Mild right perihilar interstitial prominence. Mild perihilar pulmonary edema and/or pneumonitis cannot be excluded. Electronically Signed   By: Maisie Fus  Register   On: 10/03/2015 11:21   Dg Chest Portable 1 View  Result Date: 10/01/2015 CLINICAL DATA:  Nasogastric tube placement. Decreased oxygen saturation. EXAM: PORTABLE CHEST 1 VIEW COMPARISON:  None. FINDINGS: Nasogastric tube tip and side port are below the diaphragm. No pneumothorax. There is fine nodular interstitial disease throughout the lungs with a mid to lower lung lung zone predominance. There is no airspace consolidation or volume loss. The heart size and pulmonary vascular normal. No adenopathy. There is atherosclerotic calcification in the aorta. There is degenerative change in each shoulder. IMPRESSION: Nasogastric tube tip and side port below the diaphragm. Fine nodular interstitial disease throughout the lungs of uncertain etiology or chronicity. No frank  edema or consolidation. Cardiac silhouette within normal limits. There is aortic atherosclerosis. Electronically Signed    By: Bretta Bang III M.D.   On: 10/01/2015 14:21   Dg Abd Acute W/chest  Result Date: 10/28/2015 CLINICAL DATA:  Emesis. Recent small bowel obstruction. Evaluate for pneumonia. EXAM: DG ABDOMEN ACUTE W/ 1V CHEST COMPARISON:  Chest radiograph 10/07/2015. CT abdomen/ pelvis 10/01/2015 FINDINGS: Stable cardiomegaly. Stable interstitial and reticular prominence. Reticulonodular opacities at the lung bases, may be calcified, unchanged. No new focal airspace disease. No evidence of free intra-abdominal air. Dilated small bowel loops in the central abdomen measures up to 3.8 cm. Multiple serpiginous and irregular linear opacities projecting over the upper abdomen are likely external to the patient. There are cholecystectomy clips. Post aorta bi-iliac stenting. Unchanged osseous structures. IMPRESSION: 1. Air-filled dilated loops of small bowel in the central abdomen, similar to prior exam. Recurrent small bowel obstruction not excluded. 2. Suspected overlying artifact partially obscuring evaluation of the abdomen. 3. Chronic changes in the lungs with lower lobe predominant reticulonodular opacities that may be calcified. No confluent consolidation to suggest pneumonia. Electronically Signed   By: Rubye Oaks M.D.   On: 10/28/2015 06:50   Dg Abd Portable 1v  Result Date: 10/29/2015 CLINICAL DATA:  Recent small bowel obstruction EXAM: PORTABLE ABDOMEN - 1 VIEW COMPARISON:  October 28, 2015 abdominal radiographs; CT abdomen and pelvis October 01, 2015 FINDINGS: There is currently no appreciable bowel dilatation or air-fluid level to suggest bowel obstruction. No evident free air. There is a stent in the aorta and common iliac arteries. There is a filter in the inferior vena cava with the apex of the filter at L3-4, stable. There are surgical clips in the right upper quadrant region. Visualized lung bases appear clear. IMPRESSION: Extensive postoperative change. The bowel gas pattern is unremarkable. No  demonstrable bowel obstruction or free air. Electronically Signed   By: Bretta Bang III M.D.   On: 10/29/2015 07:14   Dg Abd Portable 1v-small Bowel Obstruction Protocol-initial, 8 Hr Delay  Result Date: 10/02/2015 CLINICAL DATA:  Small bowel obstruction.  8 hour post contrast film. EXAM: PORTABLE ABDOMEN - 1 VIEW COMPARISON:  10/01/2015 FINDINGS: Enteric tube tip is in the right upper quadrant, likely in the distal stomach or duodenum bulb region. Gaseous distention of the stomach. Contrast material is demonstrated throughout the colon suggesting no evidence of complete small bowel obstruction. Vascular stents and coils. Inferior vena caval filter. Surgical clips in the right abdomen. IMPRESSION: Contrast material is present in the colon suggesting no evidence of complete small bowel obstruction. Electronically Signed   By: Burman Nieves M.D.   On: 10/02/2015 04:40   Dg Abd Portable 1v-small Bowel Protocol-position Verification  Result Date: 10/01/2015 CLINICAL DATA:  NG tube placement. EXAM: PORTABLE ABDOMEN - 1 VIEW COMPARISON:  CT 10/01/2015 FINDINGS: NG tube is in place with the tip in the perihilar region, possibly distal stomach or proximal duodenum. Side port is in the distal stomach. Decreasing small bowel dilatation. Prior cholecystectomy. No free air. IMPRESSION: NG tube tip in the peripyloric region, possibly distal stomach or proximal duodenum. Decreasing small bowel dilatation. Electronically Signed   By: Charlett Nose M.D.   On: 10/01/2015 16:21      Subjective: Heart is stable on the monitor overnight. Asymptomatic.  Discharge Exam: Vitals:   10/30/15 2123 10/31/15 0446  BP: 127/63 124/67  Pulse: 63 64  Resp: 20 20  Temp: 98.1 F (36.7 C) 98.2 F (36.8 C)  Vitals:   10/30/15 0800 10/30/15 1439 10/30/15 2123 10/31/15 0446  BP: (!) 117/48 124/71 127/63 124/67  Pulse: 62 74 63 64  Resp: (!) 26 (!) 21 20 20   Temp: 98.5 F (36.9 C) 99.2 F (37.3 C) 98.1 F (36.7  C) 98.2 F (36.8 C)  TempSrc: Oral Oral Oral Oral  SpO2: 99% 100% 100% 100%  Weight:      Height:        General: Elderly female not in distress HEENT: Moist mucosa, supple neck Cardiovascular: S1 and S2 irregular, no murmurs rub or gallop Respiratory: CTA bilaterally, no wheezing, no rhonchi Abdominal: Soft, NT, ND, bowel sounds + Extremities: Warm, no edema CNS: Alert and oriented    The results of significant diagnostics from this hospitalization (including imaging, microbiology, ancillary and laboratory) are listed below for reference.     Microbiology: Recent Results (from the past 240 hour(s))  MRSA PCR Screening     Status: None   Collection Time: 10/28/15 12:05 PM  Result Value Ref Range Status   MRSA by PCR NEGATIVE NEGATIVE Final    Comment:        The GeneXpert MRSA Assay (FDA approved for NASAL specimens only), is one component of a comprehensive MRSA colonization surveillance program. It is not intended to diagnose MRSA infection nor to guide or monitor treatment for MRSA infections.      Labs: BNP (last 3 results) No results for input(s): BNP in the last 8760 hours. Basic Metabolic Panel:  Recent Labs Lab 10/28/15 0440 10/28/15 1845 10/29/15 0403 10/30/15 0435  NA 140  --  142 142  K 3.5  --  3.9 4.8  CL 105  --  111 114*  CO2 26  --  25 25  GLUCOSE 195*  --  95 95  BUN 17  --  14 9  CREATININE 0.97  --  0.93 0.81  CALCIUM 9.5  --  8.8* 8.9  MG  --  1.5* 1.5*  --    Liver Function Tests:  Recent Labs Lab 10/28/15 0440  AST 17  ALT 13*  ALKPHOS 110  BILITOT 0.9  PROT 7.1  ALBUMIN 3.5    Recent Labs Lab 10/28/15 0440  LIPASE 23   No results for input(s): AMMONIA in the last 168 hours. CBC:  Recent Labs Lab 10/28/15 0440 10/29/15 0403  WBC 7.5 4.2  HGB 11.6* 9.0*  HCT 32.7* 26.4*  MCV 83.6 84.6  PLT 181 158   Cardiac Enzymes:  Recent Labs Lab 10/28/15 1845  TROPONINI <0.03   BNP: Invalid input(s):  POCBNP CBG: No results for input(s): GLUCAP in the last 168 hours. D-Dimer No results for input(s): DDIMER in the last 72 hours. Hgb A1c No results for input(s): HGBA1C in the last 72 hours. Lipid Profile No results for input(s): CHOL, HDL, LDLCALC, TRIG, CHOLHDL, LDLDIRECT in the last 72 hours. Thyroid function studies No results for input(s): TSH, T4TOTAL, T3FREE, THYROIDAB in the last 72 hours.  Invalid input(s): FREET3 Anemia work up No results for input(s): VITAMINB12, FOLATE, FERRITIN, TIBC, IRON, RETICCTPCT in the last 72 hours. Urinalysis    Component Value Date/Time   COLORURINE YELLOW 10/01/2015 1022   APPEARANCEUR CLEAR 10/01/2015 1022   LABSPEC 1.041 (H) 10/01/2015 1022   PHURINE 7.5 10/01/2015 1022   GLUCOSEU NEGATIVE 10/01/2015 1022   HGBUR NEGATIVE 10/01/2015 1022   BILIRUBINUR SMALL (A) 10/01/2015 1022   KETONESUR NEGATIVE 10/01/2015 1022   PROTEINUR NEGATIVE 10/01/2015 1022   NITRITE NEGATIVE  10/01/2015 1022   LEUKOCYTESUR NEGATIVE 10/01/2015 1022   Sepsis Labs Invalid input(s): PROCALCITONIN,  WBC,  LACTICIDVEN Microbiology Recent Results (from the past 240 hour(s))  MRSA PCR Screening     Status: None   Collection Time: 10/28/15 12:05 PM  Result Value Ref Range Status   MRSA by PCR NEGATIVE NEGATIVE Final    Comment:        The GeneXpert MRSA Assay (FDA approved for NASAL specimens only), is one component of a comprehensive MRSA colonization surveillance program. It is not intended to diagnose MRSA infection nor to guide or monitor treatment for MRSA infections.      Time coordinating discharge: Over 30 minutes  SIGNED:   Eddie North, MD  [facility] 10/31/2015, 12:22 PM Pager   If 7PM-7AM, please contact night-coverage [email or web address] Password [email or web address]

## 2015-11-01 ENCOUNTER — Non-Acute Institutional Stay (SKILLED_NURSING_FACILITY): Payer: Medicare Other | Admitting: Internal Medicine

## 2015-11-01 ENCOUNTER — Encounter: Payer: Self-pay | Admitting: Internal Medicine

## 2015-11-01 DIAGNOSIS — E782 Mixed hyperlipidemia: Secondary | ICD-10-CM | POA: Diagnosis not present

## 2015-11-01 DIAGNOSIS — K56609 Unspecified intestinal obstruction, unspecified as to partial versus complete obstruction: Secondary | ICD-10-CM

## 2015-11-01 DIAGNOSIS — I4891 Unspecified atrial fibrillation: Secondary | ICD-10-CM | POA: Diagnosis not present

## 2015-11-01 DIAGNOSIS — R569 Unspecified convulsions: Secondary | ICD-10-CM

## 2015-11-01 DIAGNOSIS — Z8673 Personal history of transient ischemic attack (TIA), and cerebral infarction without residual deficits: Secondary | ICD-10-CM

## 2015-11-01 DIAGNOSIS — I340 Nonrheumatic mitral (valve) insufficiency: Secondary | ICD-10-CM

## 2015-11-01 DIAGNOSIS — I428 Other cardiomyopathies: Secondary | ICD-10-CM

## 2015-11-01 NOTE — Progress Notes (Signed)
: Provider:  Randon Goldsmith. Lyn Hollingshead, MD Location:  [facility] Room Number: 216D Place of Service:  SNF ([phone])  PCP: Merrilee Seashore, MD Patient Care Team: Margit Hanks, MD as PCP - General (Internal Medicine)  Extended Emergency Contact Information Primary Emergency Contact: Tillison,Tammy Address: [address]          [place], [place] [address] [place] Phone: [phone] Relation: Daughter Secondary Emergency Contact: Gale [place] Mobile Phone: [phone] Relation: Daughter     Allergies: Chocolate; Lactose intolerance (gi); and Penicillins  Chief Complaint  Patient presents with  . New Admit To SNF    Admit to Facility    HPI: Patient is 82 y.o. female with mild dementia, A. fib, history of DVT and PE on chronic anticoagulation, history of stroke, seizure disorder, recent hospitalization for  small bowel obstruction who was admitted to [facility] from 10/15-18 where she presented with nausea and vomiting for a day. She was found to have a SBO that resolved with NG tube and with AF with RVR that required a cardizem drip then to po's. Pt is admitted to SNF for residential and supportive care. While at SNF pt will be followed for seizure prophylaxis, tx with Keppra, s/p stroke tx with eliquis and lipitor and HLD, tx with lipitor.  Past Medical History:  Diagnosis Date  . CHF (congestive heart failure) (HCC)   . Clotting disorder (HCC)   . Coronary artery disease   . Dementia without behavioral disturbance   . DVT (deep venous thrombosis) (HCC)   . Hyperlipidemia   . Hypertension   . OSA (obstructive sleep apnea)   . PE (pulmonary embolism)   . Persistent atrial fibrillation (HCC)   . Polyneuropathy (HCC)   . SBO (small bowel obstruction)   . Seizures (HCC)   . Stroke [facility])     Past Surgical History:  Procedure Laterality Date  . ABDOMINAL AORTIC ANEURYSM REPAIR    . ABDOMINAL HYSTERECTOMY    .  CHOLECYSTECTOMY    . SPINAL FUSION  2003      Medication List       Accurate as of 11/01/15 11:59 PM. Always use your most recent med list.          atorvastatin 20 MG tablet Commonly known as:  LIPITOR Take 20 mg by mouth at bedtime.   calcium-vitamin D 500-200 MG-UNIT tablet Take 1 tablet by mouth daily.   diltiazem 360 MG 24 hr capsule Commonly known as:  TIAZAC Take 360 mg by mouth daily.   donepezil 10 MG tablet Commonly known as:  ARICEPT Take 10 mg by mouth at bedtime.   DULoxetine 60 MG capsule Commonly known as:  CYMBALTA Take 60 mg by mouth at bedtime.   ELIQUIS 2.5 MG Tabs tablet Generic drug:  apixaban Take 2.5 mg by mouth 2 (two) times daily.   ENSURE ENLIVE PO Take 1 Can by mouth daily before breakfast.   furosemide 40 MG tablet Commonly known as:  LASIX Take 40 mg by mouth daily.   FUSION PLUS Caps Take 1 capsule by mouth daily.   guaifenesin 100 MG/5ML syrup Commonly known as:  ROBITUSSIN Take 200 mg by mouth daily as needed for cough.   levETIRAcetam 500 MG tablet Commonly known as:  KEPPRA Take 500 mg by mouth 2 (two) times daily.   loperamide 2 MG capsule Commonly known as:  IMODIUM Take 4 mg by mouth 4 (four)  times daily as needed for diarrhea or loose stools.   magnesium oxide 400 (241.3 Mg) MG tablet Commonly known as:  MAG-OX Take 400 mg by mouth daily.   metoprolol 100 MG tablet Commonly known as:  LOPRESSOR Take 1 tablet (100 mg total) by mouth 2 (two) times daily.   multivitamin with minerals tablet Take 1 tablet by mouth daily.   MUSCLE RUB 10-15 % Crea Apply 1 application topically 3 (three) times daily as needed (for leg pain).   omeprazole 40 MG capsule Commonly known as:  PRILOSEC Take 40 mg by mouth 2 (two) times daily.   PHENERGAN 25 MG suppository Generic drug:  promethazine Place 25 mg rectally every 12 (twelve) hours as needed for nausea or vomiting.   polyethylene glycol packet Commonly known as:   MIRALAX / GLYCOLAX Take 17 g by mouth daily.   potassium chloride SA 20 MEQ tablet Commonly known as:  K-DUR,KLOR-CON Take 1 tablet (20 mEq total) by mouth daily.   RISPERDAL 1 MG tablet Generic drug:  risperiDONE Take 1 mg by mouth 2 (two) times daily.   torsemide 20 MG tablet Commonly known as:  DEMADEX Take 20 mg by mouth daily.   travoprost (benzalkonium) 0.004 % ophthalmic solution Commonly known as:  TRAVATAN Place 1 drop into both eyes at bedtime.   TYLENOL 325 MG tablet Generic drug:  acetaminophen Take 650 mg by mouth every 4 (four) hours as needed for mild pain, moderate pain or headache. pain       Meds ordered this encounter  Medications  . torsemide (DEMADEX) 20 MG tablet    Sig: Take 20 mg by mouth daily.    Immunization History  Administered Date(s) Administered  . Influenza-Unspecified 10/11/2015  . PPD Test 04/16/2015, 05/02/2015    Social History  Substance Use Topics  . Smoking status: Never Smoker  . Smokeless tobacco: Never Used  . Alcohol use No    Family history is   Family History  Problem Relation Age of Onset  . Hypertension Mother   . Heart disease Mother   . Heart disease Father   . Cancer Sister     breast  . Diabetes Sister   . Heart disease Son   . Hypertension Son   . Thyroid disease Sister       Review of Systems  DATA OBTAINED: from patient GENERAL:  no fevers, fatigue, appetite changes SKIN: No itching, or rash EYES: No eye pain, redness, discharge EARS: No earache, tinnitus, change in hearing NOSE: No congestion, drainage or bleeding  MOUTH/THROAT: No mouth or tooth pain, No sore throat RESPIRATORY: No cough, wheezing, SOB CARDIAC: No chest pain, palpitations, lower extremity edema  GI: No abdominal pain, No N/V/D or constipation, No heartburn or reflux  GU: No dysuria, frequency or urgency, or incontinence  MUSCULOSKELETAL: No unrelieved bone/joint pain NEUROLOGIC: No headache, dizziness or focal  weakness PSYCHIATRIC: No c/o anxiety or sadness   Vitals:   11/01/15 0855  BP: 123/75  Pulse: 80  Resp: 18  Temp: 97.2 F (36.2 C)    SpO2 Readings from Last 1 Encounters:  10/31/15 100%   Body mass index is 23.49 kg/m.     Physical Exam  GENERAL APPEARANCE: Alert, conversant,  No acute distress.  SKIN: No diaphoresis rash HEAD: Normocephalic, atraumatic  EYES: Conjunctiva/lids clear. Pupils round, reactive. EOMs intact.  EARS: External exam WNL, canals clear. Hearing grossly normal.  NOSE: No deformity or discharge.  MOUTH/THROAT: Lips w/o lesions  RESPIRATORY: Breathing  is even, unlabored. Lung sounds are clear   CARDIOVASCULAR: Heart RRR no murmurs, rubs or gallops. No peripheral edema.   GASTROINTESTINAL: Abdomen is soft, non-tender, not distended w/ normal bowel sounds. GENITOURINARY: Bladder non tender, not distended  MUSCULOSKELETAL: No abnormal joints or musculature NEUROLOGIC:  Cranial nerves 2-12 grossly intact. Moves all extremities  PSYCHIATRIC: Mood and affect appropriate to situation, no behavioral issues  Patient Active Problem List   Diagnosis Date Noted  . Atrial fibrillation with RVR (HCC) 10/28/2015  . Acute respiratory failure with hypoxia (HCC) 10/17/2015  . Slurred speech   . Left sided numbness   . Acute on chronic diastolic heart failure (HCC) 10/04/2015  . Normocytic anemia 10/04/2015  . Thrombocytopenia (HCC) 10/04/2015  . TIA (transient ischemic attack) 10/04/2015  . Cough 10/02/2015  . SBO (small bowel obstruction) 10/01/2015  . Chronic CHF (congestive heart failure) (HCC) 09/29/2015  . Venous insufficiency (chronic) (peripheral) 09/29/2015  . Vitamin B12 deficiency 09/29/2015  . Anemia, chronic disease 09/29/2015  . Enterococcus UTI 09/02/2015  . GERD (gastroesophageal reflux disease) 09/02/2015  . Adynamic ileus (HCC) 06/11/2015  . CHF, acute on chronic (HCC) 06/11/2015  . Personal history of DVT (deep vein thrombosis) 06/11/2015   . Non-intractable vomiting with nausea 06/03/2015  . Jaw pain 05/27/2015  . E. coli UTI 04/22/2015  . Prerenal acute renal failure (HCC) 04/22/2015  . History of CVA (cerebrovascular accident) 04/22/2015  . AF (atrial fibrillation) (HCC) 04/22/2015  . Polyneuropathy (HCC) 04/22/2015  . Edema 01/25/2015  . Atrial fibrillation with rapid ventricular response (HCC) 01/20/2015  . Acute encephalopathy 01/20/2015  . UTI (urinary tract infection) 01/20/2015  . Tracheobronchomalacia 01/20/2015  . Personal history of PE (pulmonary embolism) 01/20/2015  . Depression 01/20/2015  . OSA (obstructive sleep apnea)   . Dementia with behavioral disturbance   . Seizures (HCC)   . Hyperlipidemia   . Hypertensive heart disease with CHF (congestive heart failure) (HCC)   . Clotting disorder (HCC)   . Cerebrovascular accident (CVA) due to thrombosis [facility](HCC)       Labs reviewed: Basic Metabolic Panel:    Component Value Date/Time   NA 142 10/30/2015 0435   NA 142 10/30/2015   K 4.8 10/30/2015 0435   CL 114 (H) 10/30/2015 0435   CO2 25 10/30/2015 0435   GLUCOSE 95 10/30/2015 0435   BUN 9 10/30/2015 0435   BUN 9 10/30/2015   CREATININE 0.81 10/30/2015 0435   CALCIUM 8.9 10/30/2015 0435   PROT 7.1 10/28/2015 0440   ALBUMIN 3.5 10/28/2015 0440   AST 17 10/28/2015 0440   ALT 13 (L) 10/28/2015 0440   ALKPHOS 110 10/28/2015 0440   BILITOT 0.9 10/28/2015 0440   GFRNONAA >60 10/30/2015 0435   GFRAA >60 10/30/2015 0435     Recent Labs  10/03/15 0415  10/28/15 0440 10/28/15 1845 10/29/15 10/29/15 0403 10/30/15 10/30/15 0435  NA 143  < > 140  --  142 142 142 142  K 2.9*  < > 3.5  --  3.9 3.9  --  4.8  CL 106  < > 105  --   --  111  --  114*  CO2 32  < > 26  --   --  25  --  25  GLUCOSE 103*  < > 195*  --   --  95  --  95  BUN 22*  < > 17  --  14 14 9 9   CREATININE 0.93  < > 0.97  --  0.9 0.93 0.8 0.81  CALCIUM 7.9*  < > 9.5  --   --  8.8*  --  8.9  MG 2.1  --   --  1.5*  --  1.5*  --    --   < > = values in this interval not displayed. Liver Function Tests:  Recent Labs  10/02/15 0214 10/03/15 0415 10/04/15 10/28/15 0440  AST 40 19 40* 17  ALT 15 12* 15 13*  ALKPHOS 137* 111 137* 110  BILITOT 0.9 0.7  --  0.9  PROT 6.3* 5.4*  --  7.1  ALBUMIN 3.1* 2.8*  --  3.5    Recent Labs  10/01/15 1040 10/28/15 0440  LIPASE 34 23   No results for input(s): AMMONIA in the last 8760 hours. CBC:  Recent Labs  10/01/15 1040  10/03/15 0415  10/07/15 0549  10/28/15 10/28/15 0440 10/29/15 10/29/15 0403  WBC 7.7  < > 6.6  < > 4.5  < > 7.5 7.5 4.2 4.2  NEUTROABS 5.7  --  4.6  --   --   --   --   --   --   --   HGB 12.6  < > 9.7*  < > 9.9*  < > 11.6* 11.6*  --  9.0*  HCT 36.0  < > 29.2*  < > 29.1*  < > 33* 32.7*  --  26.4*  MCV 84.7  < > 88.2  < > 84.3  --   --  83.6  --  84.6  PLT 175  < > 135*  < > 200  < > 181 181  --  158  < > = values in this interval not displayed. Lipid  Recent Labs  05/30/15 10/04/15 0405  CHOL 113 74  HDL 43 31*  LDLCALC 57 28  TRIG 63 74    Cardiac Enzymes:  Recent Labs  10/28/15 1845  TROPONINI <0.03   BNP: No results for input(s): BNP in the last 8760 hours. No results found for: [facility] Lab Results  Component Value Date   HGBA1C 5.8 (H) 10/04/2015   Lab Results  Component Value Date   TSH 1.522 10/05/2015   Lab Results  Component Value Date   VITAMINB12 2,099 (H) 10/05/2015   Lab Results  Component Value Date   FOLATE 22.8 10/05/2015   Lab Results  Component Value Date   IRON 42 10/05/2015   TIBC 210 (L) 10/05/2015   FERRITIN 64 10/05/2015    Imaging and Procedures obtained prior to SNF admission: Dg Abd Acute W/chest  Result Date: 10/28/2015 CLINICAL DATA:  Emesis. Recent small bowel obstruction. Evaluate for pneumonia. EXAM: DG ABDOMEN ACUTE W/ 1V CHEST COMPARISON:  Chest radiograph 10/07/2015. CT abdomen/ pelvis 10/01/2015 FINDINGS: Stable cardiomegaly. Stable interstitial and reticular prominence.  Reticulonodular opacities at the lung bases, may be calcified, unchanged. No new focal airspace disease. No evidence of free intra-abdominal air. Dilated small bowel loops in the central abdomen measures up to 3.8 cm. Multiple serpiginous and irregular linear opacities projecting over the upper abdomen are likely external to the patient. There are cholecystectomy clips. Post aorta bi-iliac stenting. Unchanged osseous structures. IMPRESSION: 1. Air-filled dilated loops of small bowel in the central abdomen, similar to prior exam. Recurrent small bowel obstruction not excluded. 2. Suspected overlying artifact partially obscuring evaluation of the abdomen. 3. Chronic changes in the lungs with lower lobe predominant reticulonodular opacities that may be calcified. No confluent consolidation to suggest pneumonia. Electronically Signed  By: Rubye Oaks M.D.   On: 10/28/2015 06:50   Dg Abd Portable 1v  Result Date: 10/29/2015 CLINICAL DATA:  Recent small bowel obstruction EXAM: PORTABLE ABDOMEN - 1 VIEW COMPARISON:  October 28, 2015 abdominal radiographs; CT abdomen and pelvis October 01, 2015 FINDINGS: There is currently no appreciable bowel dilatation or air-fluid level to suggest bowel obstruction. No evident free air. There is a stent in the aorta and common iliac arteries. There is a filter in the inferior vena cava with the apex of the filter at L3-4, stable. There are surgical clips in the right upper quadrant region. Visualized lung bases appear clear. IMPRESSION: Extensive postoperative change. The bowel gas pattern is unremarkable. No demonstrable bowel obstruction or free air. Electronically Signed   By: Bretta Bang III M.D.   On: 10/29/2015 07:14     Not all labs, radiology exams or other studies done during hospitalization come through on my EPIC note; however they are reviewed by me.    Assessment and Plan  SBO - resolved with NG tube, conservative mngt, IVF SNF - monitor for  recurrence, research of NG suction is possible in SNF  AF WITH RVR - reqiuired cardizaem drip , then back to po cardizem SNF - cont cardizem 24 hr 360 mg daily, metoprolol 100 mg BID with eliquis 2.5 mg BID  DEMENTIA SNF - at baseline, mildcont aricept 10 mg daily  NON ISCHEMIC CARDIOMYOPATHY/ MODERATE TO SEVERE MR- euvolemic SNF - cont metoprolol 100 mg BID and lasix 40 mg daily  HX STROKE SNF - cont eliquis 2.5 mg BID and lipitor  SEIZURE PROPHYLAXIS SNF - cont Keppra 500 mg BID  HLD SNF - not stated as uncontrolled;cont lipitor 20 mg daily     Time spent > 45 min;> 50% of time with patient was spent reviewing records, labs, tests and studies, counseling and developing plan of care  Thurston Hole D. Lyn Hollingshead, MD

## 2015-11-11 DIAGNOSIS — I340 Nonrheumatic mitral (valve) insufficiency: Secondary | ICD-10-CM | POA: Insufficient documentation

## 2015-11-11 DIAGNOSIS — I428 Other cardiomyopathies: Secondary | ICD-10-CM | POA: Insufficient documentation

## 2015-11-14 ENCOUNTER — Emergency Department (HOSPITAL_COMMUNITY): Payer: Medicare Other

## 2015-11-14 ENCOUNTER — Encounter: Payer: Self-pay | Admitting: Internal Medicine

## 2015-11-14 ENCOUNTER — Encounter (HOSPITAL_COMMUNITY): Payer: Self-pay | Admitting: Emergency Medicine

## 2015-11-14 ENCOUNTER — Non-Acute Institutional Stay (SKILLED_NURSING_FACILITY): Payer: Medicare Other | Admitting: Internal Medicine

## 2015-11-14 ENCOUNTER — Observation Stay (HOSPITAL_COMMUNITY): Payer: Medicare Other

## 2015-11-14 ENCOUNTER — Observation Stay (HOSPITAL_COMMUNITY)
Admission: EM | Admit: 2015-11-14 | Discharge: 2015-11-15 | Disposition: A | Discharge status: Home / Self Care | Payer: Medicare Other | Attending: Internal Medicine | Admitting: Internal Medicine

## 2015-11-14 DIAGNOSIS — Z7901 Long term (current) use of anticoagulants: Secondary | ICD-10-CM | POA: Diagnosis not present

## 2015-11-14 DIAGNOSIS — R29810 Facial weakness: Secondary | ICD-10-CM | POA: Diagnosis not present

## 2015-11-14 DIAGNOSIS — R531 Weakness: Secondary | ICD-10-CM

## 2015-11-14 DIAGNOSIS — I482 Chronic atrial fibrillation: Secondary | ICD-10-CM | POA: Diagnosis not present

## 2015-11-14 DIAGNOSIS — I5033 Acute on chronic diastolic (congestive) heart failure: Secondary | ICD-10-CM | POA: Diagnosis not present

## 2015-11-14 DIAGNOSIS — I509 Heart failure, unspecified: Secondary | ICD-10-CM

## 2015-11-14 DIAGNOSIS — I110 Hypertensive heart disease with heart failure: Secondary | ICD-10-CM | POA: Insufficient documentation

## 2015-11-14 DIAGNOSIS — I4891 Unspecified atrial fibrillation: Secondary | ICD-10-CM | POA: Diagnosis present

## 2015-11-14 DIAGNOSIS — N179 Acute kidney failure, unspecified: Secondary | ICD-10-CM | POA: Diagnosis not present

## 2015-11-14 DIAGNOSIS — Z79899 Other long term (current) drug therapy: Secondary | ICD-10-CM | POA: Insufficient documentation

## 2015-11-14 DIAGNOSIS — R569 Unspecified convulsions: Secondary | ICD-10-CM

## 2015-11-14 DIAGNOSIS — R4189 Other symptoms and signs involving cognitive functions and awareness: Secondary | ICD-10-CM

## 2015-11-14 DIAGNOSIS — I2510 Atherosclerotic heart disease of native coronary artery without angina pectoris: Secondary | ICD-10-CM | POA: Diagnosis not present

## 2015-11-14 DIAGNOSIS — I5022 Chronic systolic (congestive) heart failure: Secondary | ICD-10-CM | POA: Diagnosis not present

## 2015-11-14 DIAGNOSIS — I639 Cerebral infarction, unspecified: Secondary | ICD-10-CM | POA: Diagnosis present

## 2015-11-14 DIAGNOSIS — M6281 Muscle weakness (generalized): Principal | ICD-10-CM | POA: Insufficient documentation

## 2015-11-14 DIAGNOSIS — Z8673 Personal history of transient ischemic attack (TIA), and cerebral infarction without residual deficits: Secondary | ICD-10-CM | POA: Diagnosis not present

## 2015-11-14 DIAGNOSIS — F0390 Unspecified dementia without behavioral disturbance: Secondary | ICD-10-CM

## 2015-11-14 DIAGNOSIS — R5383 Other fatigue: Secondary | ICD-10-CM | POA: Diagnosis present

## 2015-11-14 LAB — COMPREHENSIVE METABOLIC PANEL
ALK PHOS: 85 U/L (ref 38–126)
ALT: 11 U/L — AB (ref 14–54)
AST: 18 U/L (ref 15–41)
Albumin: 3 g/dL — ABNORMAL LOW (ref 3.5–5.0)
Anion gap: 8 (ref 5–15)
BILIRUBIN TOTAL: 0.5 mg/dL (ref 0.3–1.2)
BUN: 11 mg/dL (ref 6–20)
CALCIUM: 9.3 mg/dL (ref 8.9–10.3)
CO2: 29 mmol/L (ref 22–32)
CREATININE: 1.06 mg/dL — AB (ref 0.44–1.00)
Chloride: 101 mmol/L (ref 101–111)
GFR calc Af Amer: 54 mL/min — ABNORMAL LOW (ref >60)
GFR calc non Af Amer: 46 mL/min — ABNORMAL LOW (ref >60)
Glucose, Bld: 105 mg/dL — ABNORMAL HIGH (ref 65–99)
Potassium: 4.1 mmol/L (ref 3.5–5.1)
Sodium: 138 mmol/L (ref 135–145)
TOTAL PROTEIN: 6.1 g/dL — AB (ref 6.5–8.1)

## 2015-11-14 LAB — C DIFFICILE QUICK SCREEN W PCR REFLEX
C DIFFICILE (CDIFF) TOXIN: NEGATIVE
C DIFFICLE (CDIFF) ANTIGEN: POSITIVE — AB

## 2015-11-14 LAB — URINALYSIS, ROUTINE W REFLEX MICROSCOPIC
BILIRUBIN URINE: NEGATIVE
GLUCOSE, UA: NEGATIVE mg/dL
Hgb urine dipstick: NEGATIVE
KETONES UR: NEGATIVE mg/dL
Leukocytes, UA: NEGATIVE
Nitrite: NEGATIVE
PH: 6 (ref 5.0–8.0)
Protein, ur: NEGATIVE mg/dL
Specific Gravity, Urine: 1.011 (ref 1.005–1.030)

## 2015-11-14 LAB — CBC WITH DIFFERENTIAL/PLATELET
BASOS ABS: 0 10*3/uL (ref 0.0–0.1)
Basophils Relative: 0 %
Eosinophils Absolute: 0.1 10*3/uL (ref 0.0–0.7)
Eosinophils Relative: 2 %
HEMATOCRIT: 28.8 % — AB (ref 36.0–46.0)
Hemoglobin: 9.8 g/dL — ABNORMAL LOW (ref 12.0–15.0)
LYMPHS PCT: 17 %
Lymphs Abs: 1.1 10*3/uL (ref 0.7–4.0)
MCH: 28.7 pg (ref 26.0–34.0)
MCHC: 34 g/dL (ref 30.0–36.0)
MCV: 84.2 fL (ref 78.0–100.0)
MONO ABS: 0.6 10*3/uL (ref 0.1–1.0)
Monocytes Relative: 10 %
NEUTROS ABS: 4.4 10*3/uL (ref 1.7–7.7)
Neutrophils Relative %: 71 %
Platelets: 191 10*3/uL (ref 150–400)
RBC: 3.42 MIL/uL — AB (ref 3.87–5.11)
RDW: 13.4 % (ref 11.5–15.5)
WBC: 6.3 10*3/uL (ref 4.0–10.5)

## 2015-11-14 LAB — I-STAT TROPONIN, ED: TROPONIN I, POC: 0 ng/mL (ref 0.00–0.08)

## 2015-11-14 LAB — I-STAT CG4 LACTIC ACID, ED
Lactic Acid, Venous: 1.35 mmol/L (ref 0.5–1.9)
Lactic Acid, Venous: 0.87 mmol/L (ref 0.5–1.9)

## 2015-11-14 LAB — CBG MONITORING, ED
GLUCOSE-CAPILLARY: 114 mg/dL — AB (ref 65–99)
Glucose-Capillary: 104 mg/dL — ABNORMAL HIGH (ref 65–99)

## 2015-11-14 LAB — CLOSTRIDIUM DIFFICILE BY PCR: CDIFFPCR: POSITIVE — AB

## 2015-11-14 MED ORDER — SODIUM CHLORIDE 0.9 % IV BOLUS (SEPSIS)
1000.0000 mL | Freq: Once | INTRAVENOUS | Status: AC
  Administered 2015-11-14: 1000 mL via INTRAVENOUS

## 2015-11-14 MED ORDER — SODIUM CHLORIDE 0.9 % IV SOLN
Freq: Once | INTRAVENOUS | Status: AC
  Administered 2015-11-14: 17:00:00 via INTRAVENOUS

## 2015-11-14 MED ORDER — DILTIAZEM HCL ER BEADS 240 MG PO CP24: 360.0000 mg | ORAL_CAPSULE | Freq: Every day | ORAL | Status: DC

## 2015-11-14 MED ORDER — MUSCLE RUB 10-15 % EX CREA
1.0000 "application " | TOPICAL_CREAM | Freq: Three times a day (TID) | CUTANEOUS | Status: DC | PRN
  Filled 2015-11-14: qty 85

## 2015-11-14 MED ORDER — TRAVOPROST (BAK FREE) 0.004 % OP SOLN
1.0000 [drp] | Freq: Every day | OPHTHALMIC | Status: DC
  Filled 2015-11-14: qty 2.5

## 2015-11-14 MED ORDER — MAGNESIUM OXIDE 400 (241.3 MG) MG PO TABS
400.0000 mg | ORAL_TABLET | Freq: Every day | ORAL | Status: DC
  Administered 2015-11-15: 400 mg via ORAL
  Filled 2015-11-14: qty 1

## 2015-11-14 MED ORDER — APIXABAN 2.5 MG PO TABS
2.5000 mg | ORAL_TABLET | Freq: Two times a day (BID) | ORAL | Status: DC
  Administered 2015-11-14 – 2015-11-15 (×2): 2.5 mg via ORAL
  Filled 2015-11-14 (×2): qty 1

## 2015-11-14 MED ORDER — LEVETIRACETAM 500 MG PO TABS
750.0000 mg | ORAL_TABLET | Freq: Two times a day (BID) | ORAL | Status: DC
  Administered 2015-11-14 – 2015-11-15 (×2): 750 mg via ORAL
  Filled 2015-11-14 (×2): qty 1

## 2015-11-14 MED ORDER — ATORVASTATIN CALCIUM 20 MG PO TABS
20.0000 mg | ORAL_TABLET | Freq: Every day | ORAL | Status: DC
  Administered 2015-11-14: 20 mg via ORAL
  Filled 2015-11-14: qty 1

## 2015-11-14 MED ORDER — METOPROLOL TARTRATE 100 MG PO TABS
100.0000 mg | ORAL_TABLET | Freq: Two times a day (BID) | ORAL | Status: DC
  Administered 2015-11-14 – 2015-11-15 (×2): 100 mg via ORAL
  Filled 2015-11-14 (×2): qty 1

## 2015-11-14 MED ORDER — DULOXETINE HCL 60 MG PO CPEP
60.0000 mg | ORAL_CAPSULE | Freq: Every day | ORAL | Status: DC
  Administered 2015-11-14: 60 mg via ORAL
  Filled 2015-11-14: qty 1

## 2015-11-14 MED ORDER — DONEPEZIL HCL 10 MG PO TABS
10.0000 mg | ORAL_TABLET | Freq: Every day | ORAL | Status: DC
  Administered 2015-11-14: 10 mg via ORAL
  Filled 2015-11-14: qty 1

## 2015-11-14 MED ORDER — DILTIAZEM HCL ER COATED BEADS 180 MG PO CP24
360.0000 mg | ORAL_CAPSULE | Freq: Every day | ORAL | Status: DC
  Administered 2015-11-15: 360 mg via ORAL
  Filled 2015-11-14: qty 2

## 2015-11-14 MED ORDER — RISPERIDONE 1 MG PO TABS
1.0000 mg | ORAL_TABLET | Freq: Two times a day (BID) | ORAL | Status: DC
  Administered 2015-11-15: 1 mg via ORAL
  Filled 2015-11-14 (×3): qty 1

## 2015-11-14 MED ORDER — SODIUM CHLORIDE 0.9 % IV SOLN
INTRAVENOUS | Status: DC
  Administered 2015-11-14: 21:00:00 via INTRAVENOUS

## 2015-11-14 NOTE — H&P (Signed)
History and Physical    Brandi Heath WJX:[id number] DOB: 1927-08-25 DOA: 11/14/2015  PCP: Merrilee Seashore, MD Patient coming from: [facility]  Chief Complaint: Weakness  HPI: Brandi Heath is a 82 y.o. female with medical history significant of CVA, atrial fibrillation, systolic heart failure, dementia, SBO. Patient presented to the ED after having an episode of staring into space and non-responsiveness described by the skilled nursing facility staff. Patient states that she was aware during this episode but could not speak. She reports associated weakness, especially on her left side and in her legs. Her daughter agrees that she appears weaker than previously (patient has a recent TIA and a remote history of CVA with left sided deficits). Daughter feels left facial droop has worsened since previous admission. No change in bladder or bowel control.  ED Course: Vitals: Bradycardia to 50s Labs: Creatinine of 1.06 Imaging: CT head significant for no hemorrhage or infarct. Chest x-ray unremarkable for acute process. No edema. Medications/Course: 1L Normal saline bolus given in ED. Lactic acid normal  Review of Systems: Review of Systems  Constitutional: Negative for chills, fever and malaise/fatigue.  Respiratory: Negative for cough, sputum production, shortness of breath and wheezing.   Cardiovascular: Positive for leg swelling. Negative for chest pain, palpitations, orthopnea and PND.  Gastrointestinal: Positive for diarrhea. Negative for abdominal pain, blood in stool, melena, nausea and vomiting.  Neurological: Positive for focal weakness and weakness. Negative for sensory change, speech change, seizures and loss of consciousness.    Past Medical History:  Diagnosis Date  . CHF (congestive heart failure) (HCC)   . Clotting disorder (HCC)   . Coronary artery disease   . Dementia without behavioral disturbance   . DVT (deep venous thrombosis) (HCC)   . Hyperlipidemia   . Hypertension    . OSA (obstructive sleep apnea)   . PE (pulmonary embolism)   . Persistent atrial fibrillation (HCC)   . Polyneuropathy (HCC)   . SBO (small bowel obstruction)   . Seizures (HCC)   . Stroke [facility])     Past Surgical History:  Procedure Laterality Date  . ABDOMINAL AORTIC ANEURYSM REPAIR    . ABDOMINAL HYSTERECTOMY    . CHOLECYSTECTOMY    . SPINAL FUSION  2003     reports that she has never smoked. She has never used smokeless tobacco. She reports that she does not drink alcohol. Her drug history is not on file.  Allergies  Allergen Reactions  . Chocolate Diarrhea  . Lactose Intolerance (Gi) Diarrhea  . Penicillins Other (See Comments)    Reaction:  Unknown  Has patient had a PCN reaction causing immediate rash, facial/tongue/throat swelling, SOB or lightheadedness with hypotension: Unsure Has patient had a PCN reaction causing severe rash involving mucus membranes or skin necrosis: Unsure Has patient had a PCN reaction that required hospitalization:  Unsure  Has patient had a PCN reaction occurring within the last 10 years: Unsure If all of the above answers are "NO", then may proceed with Cephalosporin use.    Family History  Problem Relation Age of Onset  . Hypertension Mother   . Heart disease Mother   . Heart disease Father   . Cancer Sister     breast  . Diabetes Sister   . Heart disease Son   . Hypertension Son   . Thyroid disease Sister     Prior to Admission medications   Medication Sig Start Date End Date Taking? Authorizing Provider  acetaminophen (TYLENOL) 325 MG tablet Take  650 mg by mouth every 4 (four) hours as needed for mild pain, moderate pain or headache. pain    Yes Historical Provider, MD  apixaban (ELIQUIS) 2.5 MG TABS tablet Take 2.5 mg by mouth 2 (two) times daily.   Yes Historical Provider, MD  atorvastatin (LIPITOR) 20 MG tablet Take 20 mg by mouth at bedtime.    Yes Historical Provider, MD  Calcium Carbonate-Vitamin D (CALCIUM-VITAMIN D)  500-200 MG-UNIT tablet Take 1 tablet by mouth daily.    Yes Historical Provider, MD  diltiazem (TIAZAC) 360 MG 24 hr capsule Take 360 mg by mouth daily.   Yes Historical Provider, MD  donepezil (ARICEPT) 10 MG tablet Take 10 mg by mouth at bedtime.    Yes Historical Provider, MD  DULoxetine (CYMBALTA) 60 MG capsule Take 60 mg by mouth at bedtime.    Yes Historical Provider, MD  furosemide (LASIX) 40 MG tablet Take 40 mg by mouth daily.   Yes Historical Provider, MD  guaifenesin (ROBITUSSIN) 100 MG/5ML syrup Take 200 mg by mouth daily as needed for cough.   Yes Historical Provider, MD  Iron-FA-B Cmp-C-Biot-Probiotic (FUSION PLUS) CAPS Take 1 capsule by mouth daily.   Yes Historical Provider, MD  levETIRAcetam (KEPPRA) 500 MG tablet Take 500 mg by mouth 2 (two) times daily.   Yes Historical Provider, MD  loperamide (IMODIUM) 2 MG capsule Take 4 mg by mouth 4 (four) times daily as needed for diarrhea or loose stools.   Yes Historical Provider, MD  magnesium oxide (MAG-OX) 400 (241.3 Mg) MG tablet Take 400 mg by mouth daily.   Yes Historical Provider, MD  Menthol-Methyl Salicylate (MUSCLE RUB) 10-15 % CREA Apply 1 application topically 3 (three) times daily as needed (for leg pain).    Yes Historical Provider, MD  metoprolol (LOPRESSOR) 100 MG tablet Take 1 tablet (100 mg total) by mouth 2 (two) times daily. 10/08/15  Yes Renae Fickle, MD  Multiple Vitamins-Minerals (MULTIVITAMIN WITH MINERALS) tablet Take 1 tablet by mouth daily.   Yes Historical Provider, MD  Nutritional Supplements (ENSURE ENLIVE PO) Take 1 Can by mouth daily before breakfast.   Yes Historical Provider, MD  omeprazole (PRILOSEC) 40 MG capsule Take 40 mg by mouth 2 (two) times daily.   Yes Historical Provider, MD  polyethylene glycol (MIRALAX / GLYCOLAX) packet Take 17 g by mouth daily.    Yes Historical Provider, MD  potassium chloride SA (K-DUR,KLOR-CON) 20 MEQ tablet Take 1 tablet (20 mEq total) by mouth daily. 10/08/15  Yes  Renae Fickle, MD  promethazine (PHENERGAN) 25 MG suppository Place 25 mg rectally every 12 (twelve) hours as needed for nausea or vomiting.   Yes Historical Provider, MD  risperiDONE (RISPERDAL) 1 MG tablet Take 1 mg by mouth 2 (two) times daily.    Yes Historical Provider, MD  travoprost, benzalkonium, (TRAVATAN) 0.004 % ophthalmic solution Place 1 drop into both eyes at bedtime.   Yes Historical Provider, MD    Physical Exam: Vitals:   11/14/15 1630 11/14/15 1645 11/14/15 1700 11/14/15 1715  BP: 103/62 100/63 104/59 104/56  Pulse: (!) 53 (!) 54 (!) 40 (!) 139  Resp: 16 19 21 12   Temp:      TempSrc:      SpO2: 94% 97% 97% 97%     Constitutional: NAD, calm, comfortable Eyes: PERRL, lids and conjunctivae normal ENMT: Mucous membranes are moist. Posterior pharynx clear of any exudate or lesions. Neck: normal, supple, no masses, no thyromegaly Respiratory: clear to auscultation bilaterally, no  wheezing, no crackles. Normal respiratory effort. No accessory muscle use.  Cardiovascular: Slow rate and irregular rhythm, no murmurs / rubs / gallops. No extremity edema. 2+ pedal pulses. No carotid bruits.  Abdomen: no tenderness, no masses palpated. Bowel sounds positive.  Musculoskeletal: no clubbing / cyanosis. No joint deformity upper and lower extremities. Good ROM, no contractures. Normal muscle tone. Decreased muscle mass.  Skin: no rashes, lesions, ulcers. No induration Neurologic: Facial droop on left side, otherwise CN intact. Sensation intact, DTR equal bilaterally. Strength 4/5 in RUE and LLE. Strength 3/5 in LUE and RLE. 4/5 grip strength bilaterally. No pronator drift. Psychiatric: Normal judgment and insight. Alert and oriented x 3. Flat affect.   Labs on Admission: I have personally reviewed following labs and imaging studies  CBC:  Recent Labs Lab 11/14/15 1503  WBC 6.3  NEUTROABS 4.4  HGB 9.8*  HCT 28.8*  MCV 84.2  PLT 191   Basic Metabolic Panel:  Recent  Labs Lab 11/14/15 1503  NA 138  K 4.1  CL 101  CO2 29  GLUCOSE 105*  BUN 11  CREATININE 1.06*  CALCIUM 9.3   GFR: Estimated Creatinine Clearance: 37 mL/min (by C-G formula based on SCr of 1.06 mg/dL (H)). Liver Function Tests:  Recent Labs Lab 11/14/15 1503  AST 18  ALT 11*  ALKPHOS 85  BILITOT 0.5  PROT 6.1*  ALBUMIN 3.0*   No results for input(s): LIPASE, AMYLASE in the last 168 hours. No results for input(s): AMMONIA in the last 168 hours. Coagulation Profile: No results for input(s): INR, PROTIME in the last 168 hours. Cardiac Enzymes: No results for input(s): CKTOTAL, CKMB, CKMBINDEX, TROPONINI in the last 168 hours. BNP (last 3 results) No results for input(s): PROBNP in the last 8760 hours. HbA1C: No results for input(s): HGBA1C in the last 72 hours. CBG:  Recent Labs Lab 11/14/15 1313 11/14/15 1518  GLUCAP 114* 104*   Lipid Profile: No results for input(s): CHOL, HDL, LDLCALC, TRIG, CHOLHDL, LDLDIRECT in the last 72 hours. Thyroid Function Tests: No results for input(s): TSH, T4TOTAL, FREET4, T3FREE, THYROIDAB in the last 72 hours. Anemia Panel: No results for input(s): VITAMINB12, FOLATE, FERRITIN, TIBC, IRON, RETICCTPCT in the last 72 hours. Urine analysis:    Component Value Date/Time   COLORURINE YELLOW 11/14/2015 1539   APPEARANCEUR CLEAR 11/14/2015 1539   LABSPEC 1.011 11/14/2015 1539   PHURINE 6.0 11/14/2015 1539   GLUCOSEU NEGATIVE 11/14/2015 1539   HGBUR NEGATIVE 11/14/2015 1539   BILIRUBINUR NEGATIVE 11/14/2015 1539   KETONESUR NEGATIVE 11/14/2015 1539   PROTEINUR NEGATIVE 11/14/2015 1539   NITRITE NEGATIVE 11/14/2015 1539   LEUKOCYTESUR NEGATIVE 11/14/2015 1539   Sepsis Labs: !!!!!!!!!!!!!!!!!!!!!!!!!!!!!!!!!!!!!!!!!!!! @LABRCNTIP (procalcitonin:4,lacticidven:4) ) Recent Results (from the past 240 hour(s))  C difficile quick scan w PCR reflex     Status: Abnormal   Collection Time: 11/14/15  3:39 PM  Result Value Ref Range  Status   C Diff antigen POSITIVE (A) NEGATIVE Final   C Diff toxin NEGATIVE NEGATIVE Final   C Diff interpretation Results are indeterminate. See PCR results.  Final  Clostridium Difficile by PCR     Status: Abnormal   Collection Time: 11/14/15  3:39 PM  Result Value Ref Range Status   Toxigenic C Difficile by pcr POSITIVE (A) NEGATIVE Final    Comment: Positive for toxigenic C. difficile with little to no toxin production. Only treat if clinical presentation suggests symptomatic illness.     Radiological Exams on Admission: Dg Chest 2 View  Result Date: 11/14/2015 CLINICAL DATA:  Altered mental status. EXAM: CHEST  2 VIEW COMPARISON:  Chest radiograph 10/28/2015 FINDINGS: Cardiomediastinal contours are unchanged. Diffusely increased pulmonary markings remain. There is no focal airspace consolidation. No pneumothorax or sizable pleural effusion. IMPRESSION: 1. No radiographic evidence of pneumonia. 2. Unchanged findings of chronic lung disease with diffusely increased pulmonary markings and bibasilar no nodular opacities. Electronically Signed   By: Deatra Robinson M.D.   On: 11/14/2015 14:16   Ct Head Wo Contrast  Result Date: 11/14/2015 CLINICAL DATA:  Altered mental status/ lethargy.  Left arm weakness EXAM: CT HEAD WITHOUT CONTRAST TECHNIQUE: Contiguous axial images were obtained from the base of the skull through the vertex without intravenous contrast. COMPARISON:  Brain MRI October 03, 2015 FINDINGS: Brain: There is moderate diffuse atrophy, unchanged. Note that there is fairly severe cerebellar atrophy, likewise stable. There is no intracranial mass, hemorrhage, extra-axial fluid collection, or midline shift. There is patchy small vessel disease in the centra semiovale bilaterally. There is no new gray-white compartment lesion. No acute infarct evident. Vascular: There is no demonstrable hyperdense vessel. There is calcification in the carotid siphon regions bilaterally. Skull: The bony  calvarium appears intact. Sinuses/Orbits: Visualized paranasal sinuses are clear. Orbits appear symmetric bilaterally. Other: Mastoid air cells are clear. IMPRESSION: Diffuse atrophy, stable. Patchy periventricular small vessel disease, stable. No acute infarct is demonstrable on this study. No hemorrhage, mass, or extra-axial fluid collection. Carotid siphon region vascular calcification evident bilaterally. Electronically Signed   By: Bretta Bang III M.D.   On: 11/14/2015 13:54    EKG: Independently reviewed. Low voltage. Appears to be a flutter pattern. Bradycardia.  Assessment/Plan Principal Problem:   Acute kidney injury (HCC) Active Problems:   Seizures (HCC)   Cerebrovascular accident (CVA) due to thrombosis (HCC)   AF (atrial fibrillation) (HCC)   Chronic CHF (congestive heart failure) (HCC)   Acute kidney injury Patient improved clinically with IV hydration. Likely secondary to decreased oral intake of fluids. Patient euvolemic on exam -continue IVF at reduced rate of 44ml/hr. Will watch for fluid overload -encourage oral hydration after passes swallow test -Bmp in AM  Weakness Facial droop History of CVA and TIA Symptoms concerning for seizure vs CVA. Exam not consistent with CVA and likely persistent from previous CVA. Daughter feels symptoms are worsened however. Discussed with neurology, Dr. Roseanne Reno and feels this is likely secondary to seizure. -MRI brain to rule out new CVA -Increase Keppra to 750mg  BID per neurology recommendations -RN swallow screen -PT eval  Seizures -increase Keppra  Atrial fibrillation Rate controlled and bradycardic. -continue diltiazem -continue metoprolol  Heart failure with reduced EF Last EF of 40-45% on 10/06/2015. Currently not fluid overloaded. Patient on a beta blocker. No ACEi or ARB currently prescribed.  -monitor weights and in/out -will be cautious with fluid resuscitation  Dementia -continue  donepezil  Depression -continue Duloxetine   DVT prophylaxis: Eliquis Code Status: DNR Family Communication: Daughter at bedside Disposition Plan: Anticipate discharge to SNF tomorrow Consults called: Neurology Admission status: Observation, medical floor   Jacquelin Hawking, MD [facility] Pager [phone]  If 7PM-7AM, please contact night-coverage [email or web address] Password [email or web address]  11/14/2015, 6:08 PM

## 2015-11-14 NOTE — ED Triage Notes (Signed)
Pt from [facility] via [place] with c/o altered mental status with lethargy per staff.  Unknown LKW.  Pt reports left arm weakness.  Grips equal.  Hx of right sided deficits from previous CVA.  NAD, A&Ox4.

## 2015-11-14 NOTE — ED Provider Notes (Signed)
[facility]-EMERGENCY DEPT Provider Note   CSN: [id number] Arrival date & time: 11/14/15  1245     History   Chief Complaint Chief Complaint  Patient presents with  . Altered Mental Status  . Fatigue    HPI Brandi Heath is a 82 y.o. female.  HPI Brandi Heath is a 82 y.o. female with history of CHF, DVT/PE on  Eliquis, coronary artery disease, dementia, hypertension, A. fib, small bowel obstructions, stroke, seizures, presents to emergency department complaining of altered mental status. Most of the history provided by nursing home staff. I did speak with them directly. Apparently around 11:15 this morning, patient was getting ready to do her physical therapy, when physical therapist noted that she was weaker and her upper and lower extremities than at baseline. Patient shortly after that had an episode of "unresponsiveness." Nurse describes that patient had a blank stare on her face, was drooling, unable to speak or answer questions, but was able to eat nod yes and no. Patient at baseline is verbal, most of the time alert and oriented 4, able to perform ADLs, such as feeding herself and dressing herself with minimal assistance. She is nonambulatory, uses a left and a wheelchair. After this episode of nonresponsiveness, EMS was called, patient was evaluated by physicians at the site, and was transferred here for further evaluation. By the time EMS arrived to the scene, patient apparently became more verbal and was able to communicate better. Patient has not had any recent illnesses, she was admitted and discharged from the hospital 2 weeks ago for small bowel obstruction. Patient denies any pain at this time. She has not had any nausea or vomiting. No diarrhea. She denies any urinary symptoms. No chest pain or shortness of breath. No dizziness. No headache.  Past Medical History:  Diagnosis Date  . CHF (congestive heart failure) (HCC)   . Clotting disorder (HCC)   . Coronary artery disease   .  Dementia without behavioral disturbance   . DVT (deep venous thrombosis) (HCC)   . Hyperlipidemia   . Hypertension   . OSA (obstructive sleep apnea)   . PE (pulmonary embolism)   . Persistent atrial fibrillation (HCC)   . Polyneuropathy (HCC)   . SBO (small bowel obstruction)   . Seizures (HCC)   . Stroke [facility])     Patient Active Problem List   Diagnosis Date Noted  . Non-ischemic cardiomyopathy (HCC) 11/11/2015  . MR (mitral regurgitation) 11/11/2015  . Atrial fibrillation with RVR (HCC) 10/28/2015  . Acute respiratory failure with hypoxia (HCC) 10/17/2015  . Slurred speech   . Left sided numbness   . Acute on chronic diastolic heart failure (HCC) 10/04/2015  . Normocytic anemia 10/04/2015  . Thrombocytopenia (HCC) 10/04/2015  . TIA (transient ischemic attack) 10/04/2015  . Cough 10/02/2015  . SBO (small bowel obstruction) 10/01/2015  . Chronic CHF (congestive heart failure) (HCC) 09/29/2015  . Venous insufficiency (chronic) (peripheral) 09/29/2015  . Vitamin B12 deficiency 09/29/2015  . Anemia, chronic disease 09/29/2015  . Enterococcus UTI 09/02/2015  . GERD (gastroesophageal reflux disease) 09/02/2015  . Adynamic ileus (HCC) 06/11/2015  . CHF, acute on chronic (HCC) 06/11/2015  . Personal history of DVT (deep vein thrombosis) 06/11/2015  . Non-intractable vomiting with nausea 06/03/2015  . Jaw pain 05/27/2015  . E. coli UTI 04/22/2015  . Prerenal acute renal failure (HCC) 04/22/2015  . History of CVA (cerebrovascular accident) 04/22/2015  . AF (atrial fibrillation) (HCC) 04/22/2015  . Polyneuropathy (HCC) 04/22/2015  . Edema 01/25/2015  .  Atrial fibrillation with rapid ventricular response (HCC) 01/20/2015  . Acute encephalopathy 01/20/2015  . UTI (urinary tract infection) 01/20/2015  . Tracheobronchomalacia 01/20/2015  . Personal history of PE (pulmonary embolism) 01/20/2015  . Depression 01/20/2015  . OSA (obstructive sleep apnea)   . Dementia with behavioral  disturbance   . Seizures (HCC)   . Hyperlipidemia   . Hypertensive heart disease with CHF (congestive heart failure) (HCC)   . Clotting disorder (HCC)   . Cerebrovascular accident (CVA) due to thrombosis [facility])     Past Surgical History:  Procedure Laterality Date  . ABDOMINAL AORTIC ANEURYSM REPAIR    . ABDOMINAL HYSTERECTOMY    . CHOLECYSTECTOMY    . SPINAL FUSION  2003    OB History    No data available       Home Medications    Prior to Admission medications   Medication Sig Start Date End Date Taking? Authorizing Provider  acetaminophen (TYLENOL) 325 MG tablet Take 650 mg by mouth every 4 (four) hours as needed for mild pain, moderate pain or headache. pain     Historical Provider, MD  apixaban (ELIQUIS) 2.5 MG TABS tablet Take 2.5 mg by mouth 2 (two) times daily.    Historical Provider, MD  atorvastatin (LIPITOR) 20 MG tablet Take 20 mg by mouth at bedtime.     Historical Provider, MD  Calcium Carbonate-Vitamin D (CALCIUM-VITAMIN D) 500-200 MG-UNIT tablet Take 1 tablet by mouth daily.     Historical Provider, MD  diltiazem (TIAZAC) 360 MG 24 hr capsule Take 360 mg by mouth daily.    Historical Provider, MD  donepezil (ARICEPT) 10 MG tablet Take 10 mg by mouth at bedtime.     Historical Provider, MD  DULoxetine (CYMBALTA) 60 MG capsule Take 60 mg by mouth at bedtime.     Historical Provider, MD  furosemide (LASIX) 40 MG tablet Take 40 mg by mouth daily.    Historical Provider, MD  guaifenesin (ROBITUSSIN) 100 MG/5ML syrup Take 200 mg by mouth daily as needed for cough.    Historical Provider, MD  Iron-FA-B Cmp-C-Biot-Probiotic (FUSION PLUS) CAPS Take 1 capsule by mouth daily.    Historical Provider, MD  levETIRAcetam (KEPPRA) 500 MG tablet Take 500 mg by mouth 2 (two) times daily.    Historical Provider, MD  loperamide (IMODIUM) 2 MG capsule Take 4 mg by mouth 4 (four) times daily as needed for diarrhea or loose stools.    Historical Provider, MD  magnesium oxide (MAG-OX)  400 (241.3 Mg) MG tablet Take 400 mg by mouth daily.    Historical Provider, MD  Menthol-Methyl Salicylate (MUSCLE RUB) 10-15 % CREA Apply 1 application topically 3 (three) times daily as needed (for leg pain).     Historical Provider, MD  metoprolol (LOPRESSOR) 100 MG tablet Take 1 tablet (100 mg total) by mouth 2 (two) times daily. 10/08/15   Renae Fickle, MD  Multiple Vitamins-Minerals (MULTIVITAMIN WITH MINERALS) tablet Take 1 tablet by mouth daily.    Historical Provider, MD  Nutritional Supplements (ENSURE ENLIVE PO) Take 1 Can by mouth daily before breakfast.    Historical Provider, MD  omeprazole (PRILOSEC) 40 MG capsule Take 40 mg by mouth 2 (two) times daily.    Historical Provider, MD  polyethylene glycol (MIRALAX / GLYCOLAX) packet Take 17 g by mouth daily.     Historical Provider, MD  potassium chloride SA (K-DUR,KLOR-CON) 20 MEQ tablet Take 1 tablet (20 mEq total) by mouth daily. 10/08/15   Renae Fickle,  MD  promethazine (PHENERGAN) 25 MG suppository Place 25 mg rectally every 12 (twelve) hours as needed for nausea or vomiting.    Historical Provider, MD  risperiDONE (RISPERDAL) 1 MG tablet Take 1 mg by mouth 2 (two) times daily.     Historical Provider, MD  torsemide (DEMADEX) 20 MG tablet Take 20 mg by mouth daily.    Historical Provider, MD  travoprost, benzalkonium, (TRAVATAN) 0.004 % ophthalmic solution Place 1 drop into both eyes at bedtime.    Historical Provider, MD    Family History Family History  Problem Relation Age of Onset  . Hypertension Mother   . Heart disease Mother   . Heart disease Father   . Cancer Sister     breast  . Diabetes Sister   . Heart disease Son   . Hypertension Son   . Thyroid disease Sister     Social History Social History  Substance Use Topics  . Smoking status: Never Smoker  . Smokeless tobacco: Never Used  . Alcohol use No     Allergies   Chocolate; Lactose intolerance (gi); and Penicillins   Review of Systems Review of  Systems  Constitutional: Negative for chills and fever.  Respiratory: Negative for cough, chest tightness and shortness of breath.   Cardiovascular: Negative for chest pain, palpitations and leg swelling.  Gastrointestinal: Negative for abdominal pain, diarrhea, nausea and vomiting.  Genitourinary: Negative for dysuria, flank pain and pelvic pain.  Musculoskeletal: Negative for arthralgias, myalgias, neck pain and neck stiffness.  Skin: Negative for rash.  Neurological: Negative for dizziness, weakness and headaches.  All other systems reviewed and are negative.    Physical Exam Updated Vital Signs BP (!) 96/52   Pulse 60   Temp 97.5 F (36.4 C) (Oral)   Resp 16   SpO2 96%   Physical Exam  Constitutional: She is oriented to person, place, and time. She appears well-developed and well-nourished. No distress.  HENT:  Head: Normocephalic.  Eyes: Conjunctivae are normal.  Pupils are 2 mm, reactive to light and accommodation  Neck: Normal range of motion. Neck supple.  Cardiovascular: Normal rate and regular rhythm.   Murmur heard. Pulmonary/Chest: Effort normal and breath sounds normal. No respiratory distress. She has no wheezes. She has no rales.  Abdominal: Soft. Bowel sounds are normal. She exhibits no distension. There is no tenderness. There is no rebound.  Musculoskeletal: She exhibits edema.  Neurological: She is alert and oriented to person, place, and time.  Patient is alert and oriented 3, she is leaning to the left in the bed. She is having left facial droop and drooling. She is able to speak, with somewhat slurred speech. I am unsure of baseline of her speech. Grip strength is 5 out of 5 and equal. She is able to hold her arms in front of her with no drift. Unable to do finger to nose. She is unable to lift bilateral legs of the stretcher. She is able to wiggle her toes and feet.  Skin: Skin is warm and dry.  Psychiatric: She has a normal mood and affect. Her behavior  is normal.  Nursing note and vitals reviewed.    ED Treatments / Results  Labs (all labs ordered are listed, but only abnormal results are displayed) Labs Reviewed  C DIFFICILE QUICK SCREEN W PCR REFLEX - Abnormal; Notable for the following:       Result Value   C Diff antigen POSITIVE (*)    All other components within  normal limits  CBC WITH DIFFERENTIAL/PLATELET - Abnormal; Notable for the following:    RBC 3.42 (*)    Hemoglobin 9.8 (*)    HCT 28.8 (*)    All other components within normal limits  COMPREHENSIVE METABOLIC PANEL - Abnormal; Notable for the following:    Glucose, Bld 105 (*)    Creatinine, Ser 1.06 (*)    Total Protein 6.1 (*)    Albumin 3.0 (*)    ALT 11 (*)    GFR calc non Af Amer 46 (*)    GFR calc Af Amer 54 (*)    All other components within normal limits  CBG MONITORING, ED - Abnormal; Notable for the following:    Glucose-Capillary 114 (*)    All other components within normal limits  CBG MONITORING, ED - Abnormal; Notable for the following:    Glucose-Capillary 104 (*)    All other components within normal limits  CLOSTRIDIUM DIFFICILE BY PCR  URINALYSIS, ROUTINE W REFLEX MICROSCOPIC (NOT AT [facility])  I-STAT TROPOININ, ED  I-STAT CG4 LACTIC ACID, ED  I-STAT CG4 LACTIC ACID, ED    EKG  EKG Interpretation  Date/Time:  Wednesday November 14 2015 15:07:49 EDT Ventricular Rate:  52 PR Interval:    QRS Duration: 133 QT Interval:  438 QTC Calculation: 408 R Axis:   55 Text Interpretation:  Atrial flutter Nonspecific intraventricular conduction delay Borderline repolarization abnormality Previously AFIB with RVR Confirmed by Read Drivers  MD, Jonny Ruiz ([id number]) on 11/15/2015 10:15:37 AM       Radiology Dg Chest 2 View  Result Date: 11/14/2015 CLINICAL DATA:  Altered mental status. EXAM: CHEST  2 VIEW COMPARISON:  Chest radiograph 10/28/2015 FINDINGS: Cardiomediastinal contours are unchanged. Diffusely increased pulmonary markings remain. There is no focal  airspace consolidation. No pneumothorax or sizable pleural effusion. IMPRESSION: 1. No radiographic evidence of pneumonia. 2. Unchanged findings of chronic lung disease with diffusely increased pulmonary markings and bibasilar no nodular opacities. Electronically Signed   By: Deatra Robinson M.D.   On: 11/14/2015 14:16   Ct Head Wo Contrast  Result Date: 11/14/2015 CLINICAL DATA:  Altered mental status/ lethargy.  Left arm weakness EXAM: CT HEAD WITHOUT CONTRAST TECHNIQUE: Contiguous axial images were obtained from the base of the skull through the vertex without intravenous contrast. COMPARISON:  Brain MRI October 03, 2015 FINDINGS: Brain: There is moderate diffuse atrophy, unchanged. Note that there is fairly severe cerebellar atrophy, likewise stable. There is no intracranial mass, hemorrhage, extra-axial fluid collection, or midline shift. There is patchy small vessel disease in the centra semiovale bilaterally. There is no new gray-white compartment lesion. No acute infarct evident. Vascular: There is no demonstrable hyperdense vessel. There is calcification in the carotid siphon regions bilaterally. Skull: The bony calvarium appears intact. Sinuses/Orbits: Visualized paranasal sinuses are clear. Orbits appear symmetric bilaterally. Other: Mastoid air cells are clear. IMPRESSION: Diffuse atrophy, stable. Patchy periventricular small vessel disease, stable. No acute infarct is demonstrable on this study. No hemorrhage, mass, or extra-axial fluid collection. Carotid siphon region vascular calcification evident bilaterally. Electronically Signed   By: Bretta Bang III M.D.   On: 11/14/2015 13:54    Procedures Procedures (including critical care time)  Medications Ordered in ED Medications - No data to display   Initial Impression / Assessment and Plan / ED Course  I have reviewed the triage vital signs and the nursing notes.  Pertinent labs & imaging results that were available during my care  of the patient were reviewed by me  and considered in my medical decision making (see chart for details).  Clinical Course   Patient with generalized weakness and episode of unresponsiveness from nursing home. Daughter at bedside states the patient looks generally more beak pain usual. Patient normally is able to talk and feed herself, patient is too weak to even raise her arms up right now. Will check labs, CT head, will give some IV fluids. Will check urine.   No significant findings on lab work, chest x-ray, urinalysis, CT head. Patient is mentating well, but appears to be generally weak. Spoke with hospitalist, they will come by and see patient, and make a decision whether to admit or discharge home.  Vitals:   11/14/15 1830 11/14/15 2021 11/15/15 0433 11/15/15 0926  BP: 116/67 132/66 129/68 122/62  Pulse: 60 66 70 72  Resp: 21 16 19 18   Temp:  97.6 F (36.4 C) 98.5 F (36.9 C) 98.2 F (36.8 C)  TempSrc:    Oral  SpO2: 97% 97% 92% 93%  Weight:  66.2 kg    Height:  5\' 9"  (1.753 m)       Final Clinical Impressions(s) / ED Diagnoses   Final diagnoses:  Facial droop  Left-sided weakness    New Prescriptions Current Discharge Medication List       Jaynie Crumble, PA-C 11/15/15 1558    Loren Racer, MD 11/16/15 1022

## 2015-11-14 NOTE — ED Notes (Signed)
Pt transported to MRI 

## 2015-11-14 NOTE — Progress Notes (Signed)
[facility] Room Number: 216D Place of Service:  SNF [phone]  Merrilee SeashoreAnne Jarrel Knoke, MD  Patient Care Team: Margit HanksAnne D Janace Decker, MD as PCP - General (Internal Medicine)  Extended Emergency Contact Information Primary Emergency Contact: [facility],Tammy Address: [address]          [place], [place] [address] [place] Mobile Phone: [phone] Relation: Daughter Secondary Emergency Contact: Gale JourneyBivens,Billy  [place] Mobile Phone: [phone] Relation: Daughter    Allergies: Chocolate; Lactose intolerance (gi); and Penicillins  Chief Complaint  Patient presents with  . Acute Visit    Acute    HPI: Patient is 82 y.o. female who nursing asked me to see urgently. Pt was reported normal this am. Pt normally speaks, can do her ADL's reads 400 page books. A few minutes a go she was found staring and leaning to the left. Her BP 160/75, P 81 R 18 , BS 150  Past Medical History:  Diagnosis Date  . CHF (congestive heart failure) (HCC)   . Clotting disorder (HCC)   . Coronary artery disease   . Dementia without behavioral disturbance   . DVT (deep venous thrombosis) (HCC)   . Hyperlipidemia   . Hypertension   . OSA (obstructive sleep apnea)   . PE (pulmonary embolism)   . Persistent atrial fibrillation (HCC)   . Polyneuropathy (HCC)   . SBO (small bowel obstruction)   . Seizures (HCC)   . Stroke [facility](HCC)     Past Surgical History:  Procedure Laterality Date  . ABDOMINAL AORTIC ANEURYSM REPAIR    . ABDOMINAL HYSTERECTOMY    . CHOLECYSTECTOMY    . SPINAL FUSION  2003      Medication List       Accurate as of 11/14/15 11:59 AM. Always use your most recent med list.          atorvastatin 20 MG tablet Commonly known as:  LIPITOR Take 20 mg by mouth at bedtime.   calcium-vitamin D 500-200 MG-UNIT tablet Take 1 tablet by mouth daily.   diltiazem 360 MG 24 hr capsule Commonly known as:  TIAZAC Take 360 mg by  mouth daily.   donepezil 10 MG tablet Commonly known as:  ARICEPT Take 10 mg by mouth at bedtime.   DULoxetine 60 MG capsule Commonly known as:  CYMBALTA Take 60 mg by mouth at bedtime.   ELIQUIS 2.5 MG Tabs tablet Generic drug:  apixaban Take 2.5 mg by mouth 2 (two) times daily.   ENSURE ENLIVE PO Take 1 Can by mouth daily before breakfast.   furosemide 40 MG tablet Commonly known as:  LASIX Take 40 mg by mouth daily.   FUSION PLUS Caps Take 1 capsule by mouth daily.   guaifenesin 100 MG/5ML syrup Commonly known as:  ROBITUSSIN Take 200 mg by mouth daily as needed for cough.   levETIRAcetam 500 MG tablet Commonly known as:  KEPPRA Take 500 mg by mouth 2 (two) times daily.   loperamide 2 MG capsule Commonly known as:  IMODIUM Take 4 mg by mouth 4 (four) times daily as needed for diarrhea or loose stools.   magnesium oxide 400 (241.3 Mg) MG tablet Commonly known as:  MAG-OX Take 400 mg by mouth daily.   metoprolol 100 MG tablet Commonly known as:  LOPRESSOR Take 1 tablet (100 mg total) by mouth 2 (two) times daily.   multivitamin with minerals tablet Take 1 tablet by mouth daily.  MUSCLE RUB 10-15 % Crea Apply 1 application topically 3 (three) times daily as needed (for leg pain).   omeprazole 40 MG capsule Commonly known as:  PRILOSEC Take 40 mg by mouth 2 (two) times daily.   PHENERGAN 25 MG suppository Generic drug:  promethazine Place 25 mg rectally every 12 (twelve) hours as needed for nausea or vomiting.   polyethylene glycol packet Commonly known as:  MIRALAX / GLYCOLAX Take 17 g by mouth daily.   potassium chloride SA 20 MEQ tablet Commonly known as:  K-DUR,KLOR-CON Take 1 tablet (20 mEq total) by mouth daily.   RISPERDAL 1 MG tablet Generic drug:  risperiDONE Take 1 mg by mouth 2 (two) times daily.   torsemide 20 MG tablet Commonly known as:  DEMADEX Take 20 mg by mouth daily.   travoprost (benzalkonium) 0.004 % ophthalmic  solution Commonly known as:  TRAVATAN Place 1 drop into both eyes at bedtime.   TYLENOL 325 MG tablet Generic drug:  acetaminophen Take 650 mg by mouth every 4 (four) hours as needed for mild pain, moderate pain or headache. pain       No orders of the defined types were placed in this encounter.   Immunization History  Administered Date(s) Administered  . Influenza-Unspecified 10/11/2015  . PPD Test 04/16/2015, 05/02/2015    Social History  Substance Use Topics  . Smoking status: Never Smoker  . Smokeless tobacco: Never Used  . Alcohol use No    Review of    UTO 2/2 pt decreased responsiveness; nursing as per HPI    Vitals:   11/14/15 1152  BP: 130/79  Pulse: 98  Resp: 18  Temp: (!) 92 F (33.3 C)   Body mass index is 25.06 kg/m. Physical Exam  GENERAL APPEARANCE- eyes open, No acute distress, looks when you say her name SKIN: No diaphoresis rash HEENT: Unremarkable RESPIRATORY: Breathing is even, unlabored. Lung sounds are clear; RA O2 sat 99% CARDIOVASCULAR: Heart RRR no murmurs, rubs or gallops. No peripheral edema  GASTROINTESTINAL: Abdomen is soft, non-tender, not distended w/ normal bowel sounds.  GENITOURINARY: Bladder non tender, not distended  MUSCULOSKELETAL: No abnormal joints or musculature NEUROLOGIC: Cranial nerves 2-12 grossly intact; looks at you when you call her name but doesn't speak; Moves all extremities, maybe very mild weakness on L PSYCHIATRIC: n/a  Patient Active Problem List   Diagnosis Date Noted  . Non-ischemic cardiomyopathy (HCC) 11/11/2015  . MR (mitral regurgitation) 11/11/2015  . Atrial fibrillation with RVR (HCC) 10/28/2015  . Acute respiratory failure with hypoxia (HCC) 10/17/2015  . Slurred speech   . Left sided numbness   . Acute on chronic diastolic heart failure (HCC) 10/04/2015  . Normocytic anemia 10/04/2015  . Thrombocytopenia (HCC) 10/04/2015  . TIA (transient ischemic attack) 10/04/2015  . Cough 10/02/2015   . SBO (small bowel obstruction) 10/01/2015  . Chronic CHF (congestive heart failure) (HCC) 09/29/2015  . Venous insufficiency (chronic) (peripheral) 09/29/2015  . Vitamin B12 deficiency 09/29/2015  . Anemia, chronic disease 09/29/2015  . Enterococcus UTI 09/02/2015  . GERD (gastroesophageal reflux disease) 09/02/2015  . Adynamic ileus (HCC) 06/11/2015  . CHF, acute on chronic (HCC) 06/11/2015  . Personal history of DVT (deep vein thrombosis) 06/11/2015  . Non-intractable vomiting with nausea 06/03/2015  . Jaw pain 05/27/2015  . E. coli UTI 04/22/2015  . Prerenal acute renal failure (HCC) 04/22/2015  . History of CVA (cerebrovascular accident) 04/22/2015  . AF (atrial fibrillation) (HCC) 04/22/2015  . Polyneuropathy (HCC) 04/22/2015  .  Edema 01/25/2015  . Atrial fibrillation with rapid ventricular response (HCC) 01/20/2015  . Acute encephalopathy 01/20/2015  . UTI (urinary tract infection) 01/20/2015  . Tracheobronchomalacia 01/20/2015  . Personal history of PE (pulmonary embolism) 01/20/2015  . Depression 01/20/2015  . OSA (obstructive sleep apnea)   . Dementia with behavioral disturbance   . Seizures (HCC)   . Hyperlipidemia   . Hypertensive heart disease with CHF (congestive heart failure) (HCC)   . Clotting disorder (HCC)   . Cerebrovascular accident (CVA) due to thrombosis [facility])     CMP     Component Value Date/Time   NA 142 10/30/2015 0435   NA 142 10/30/2015   K 4.8 10/30/2015 0435   CL 114 (H) 10/30/2015 0435   CO2 25 10/30/2015 0435   GLUCOSE 95 10/30/2015 0435   BUN 9 10/30/2015 0435   BUN 9 10/30/2015   CREATININE 0.81 10/30/2015 0435   CALCIUM 8.9 10/30/2015 0435   PROT 7.1 10/28/2015 0440   ALBUMIN 3.5 10/28/2015 0440   AST 17 10/28/2015 0440   ALT 13 (L) 10/28/2015 0440   ALKPHOS 110 10/28/2015 0440   BILITOT 0.9 10/28/2015 0440   GFRNONAA >60 10/30/2015 0435   GFRAA >60 10/30/2015 0435    Recent Labs  10/03/15 0415  10/28/15 0440  10/28/15 1845 10/29/15 10/29/15 0403 10/30/15 10/30/15 0435  NA 143  < > 140  --  142 142 142 142  K 2.9*  < > 3.5  --  3.9 3.9  --  4.8  CL 106  < > 105  --   --  111  --  114*  CO2 32  < > 26  --   --  25  --  25  GLUCOSE 103*  < > 195*  --   --  95  --  95  BUN 22*  < > 17  --  14 14 9 9   CREATININE 0.93  < > 0.97  --  0.9 0.93 0.8 0.81  CALCIUM 7.9*  < > 9.5  --   --  8.8*  --  8.9  MG 2.1  --   --  1.5*  --  1.5*  --   --   < > = values in this interval not displayed.  Recent Labs  10/02/15 0214 10/03/15 0415 10/04/15 10/28/15 0440  AST 40 19 40* 17  ALT 15 12* 15 13*  ALKPHOS 137* 111 137* 110  BILITOT 0.9 0.7  --  0.9  PROT 6.3* 5.4*  --  7.1  ALBUMIN 3.1* 2.8*  --  3.5    Recent Labs  10/01/15 1040  10/03/15 0415  10/07/15 0549  10/28/15 10/28/15 0440 10/29/15 10/29/15 0403  WBC 7.7  < > 6.6  < > 4.5  < > 7.5 7.5 4.2 4.2  NEUTROABS 5.7  --  4.6  --   --   --   --   --   --   --   HGB 12.6  < > 9.7*  < > 9.9*  < > 11.6* 11.6*  --  9.0*  HCT 36.0  < > 29.2*  < > 29.1*  < > 33* 32.7*  --  26.4*  MCV 84.7  < > 88.2  < > 84.3  --   --  83.6  --  84.6  PLT 175  < > 135*  < > 200  < > 181 181  --  158  < > = values in this  interval not displayed.  Recent Labs  05/30/15 10/04/15 0405  CHOL 113 74  LDLCALC 57 28  TRIG 63 74   No results found for: [facility] Lab Results  Component Value Date   TSH 1.522 10/05/2015   Lab Results  Component Value Date   HGBA1C 5.8 (H) 10/04/2015   Lab Results  Component Value Date   CHOL 74 10/04/2015   HDL 31 (L) 10/04/2015   LDLCALC 28 10/04/2015   TRIG 74 10/04/2015   CHOLHDL 2.4 10/04/2015    Significant Diagnostic Results in last 30 days:  Dg Abd Acute W/chest  Result Date: 10/28/2015 CLINICAL DATA:  Emesis. Recent small bowel obstruction. Evaluate for pneumonia. EXAM: DG ABDOMEN ACUTE W/ 1V CHEST COMPARISON:  Chest radiograph 10/07/2015. CT abdomen/ pelvis 10/01/2015 FINDINGS: Stable cardiomegaly. Stable  interstitial and reticular prominence. Reticulonodular opacities at the lung bases, may be calcified, unchanged. No new focal airspace disease. No evidence of free intra-abdominal air. Dilated small bowel loops in the central abdomen measures up to 3.8 cm. Multiple serpiginous and irregular linear opacities projecting over the upper abdomen are likely external to the patient. There are cholecystectomy clips. Post aorta bi-iliac stenting. Unchanged osseous structures. IMPRESSION: 1. Air-filled dilated loops of small bowel in the central abdomen, similar to prior exam. Recurrent small bowel obstruction not excluded. 2. Suspected overlying artifact partially obscuring evaluation of the abdomen. 3. Chronic changes in the lungs with lower lobe predominant reticulonodular opacities that may be calcified. No confluent consolidation to suggest pneumonia. Electronically Signed   By: Rubye Oaks M.D.   On: 10/28/2015 06:50   Dg Abd Portable 1v  Result Date: 10/29/2015 CLINICAL DATA:  Recent small bowel obstruction EXAM: PORTABLE ABDOMEN - 1 VIEW COMPARISON:  October 28, 2015 abdominal radiographs; CT abdomen and pelvis October 01, 2015 FINDINGS: There is currently no appreciable bowel dilatation or air-fluid level to suggest bowel obstruction. No evident free air. There is a stent in the aorta and common iliac arteries. There is a filter in the inferior vena cava with the apex of the filter at L3-4, stable. There are surgical clips in the right upper quadrant region. Visualized lung bases appear clear. IMPRESSION: Extensive postoperative change. The bowel gas pattern is unremarkable. No demonstrable bowel obstruction or free air. Electronically Signed   By: Bretta Bang III M.D.   On: 10/29/2015 07:14    Assessment and Plan  SUDDEN IN CHANGE IN RESPONSIVENESS/DEC RESPONSIVENESS - BEHAVOIR IS VERY UN CHARACTERISTIC FOR PT; post -ictal, although no one saw a a siezure; no  bowel incontinence, can't tell if  bladder vs stroke vs sepsis; pt will need to go to ED    Time spent > 35 min Natalie Mceuen D. Lyn Hollingshead, MD

## 2015-11-14 NOTE — ED Notes (Addendum)
Dr. Nettey at bedside 

## 2015-11-15 DIAGNOSIS — M6281 Muscle weakness (generalized): Secondary | ICD-10-CM | POA: Diagnosis not present

## 2015-11-15 DIAGNOSIS — N179 Acute kidney failure, unspecified: Secondary | ICD-10-CM

## 2015-11-15 LAB — BASIC METABOLIC PANEL
Anion gap: 7 (ref 5–15)
BUN: 7 mg/dL (ref 6–20)
CHLORIDE: 102 mmol/L (ref 101–111)
CO2: 29 mmol/L (ref 22–32)
CREATININE: 0.87 mg/dL (ref 0.44–1.00)
Calcium: 9 mg/dL (ref 8.9–10.3)
GFR calc non Af Amer: 59 mL/min — ABNORMAL LOW (ref >60)
Glucose, Bld: 92 mg/dL (ref 65–99)
POTASSIUM: 3.6 mmol/L (ref 3.5–5.1)
SODIUM: 138 mmol/L (ref 135–145)

## 2015-11-15 LAB — GLUCOSE, CAPILLARY: GLUCOSE-CAPILLARY: 90 mg/dL (ref 65–99)

## 2015-11-15 MED ORDER — LEVETIRACETAM 750 MG PO TABS: 750.0000 mg | ORAL_TABLET | Freq: Two times a day (BID) | ORAL | 0 refills | Status: DC

## 2015-11-15 MED ORDER — RISPERIDONE 1 MG PO TABS: 1.0000 mg | ORAL_TABLET | Freq: Two times a day (BID) | ORAL | 0 refills | Status: DC

## 2015-11-15 MED ORDER — LEVETIRACETAM 750 MG PO TABS: 750.0000 mg | ORAL_TABLET | Freq: Two times a day (BID) | ORAL | 0 refills | Status: AC

## 2015-11-15 NOTE — Discharge Summary (Addendum)
[facility]  Physician Discharge Summary   Patient ID: Brandi Heath MRN: [id number] DOB/AGE: 1927-06-21 82 y.o.  Admit date: 11/14/2015 Discharge date: 11/15/2015  PCP: Merrilee Seashore, MD  DISCHARGE DIAGNOSES:  Principal Problem:   Acute kidney injury [facility]) Active Problems:   Seizures (HCC)   Cerebrovascular accident (CVA) due to thrombosis (HCC)   AF (atrial fibrillation) (HCC)   Chronic CHF (congestive heart failure) (HCC)   RECOMMENDATIONS FOR OUTPATIENT FOLLOW UP: 1. Please monitor for diarrhea. Please see below 2. Speech pathology to re-evaluate patient at SNF.   DISCHARGE CONDITION: fair  Diet recommendation: Dysphagia 1 with thin liquids  Filed Weights   11/14/15 2021  Weight: 66.2 kg (146 lb)    INITIAL HISTORY: HPI: Brandi Heath is a 82 y.o. female with medical history significant of CVA, atrial fibrillation, systolic heart failure, dementia, SBO. Patient presented to the ED after having an episode of staring into space and non-responsiveness described by the skilled nursing facility staff. There was concern for worsening left-sided weakness. Patient was hospitalized for further management.  Consultations:  Phone discussion with neurology  Procedures:  None  HOSPITAL COURSE:   Weakness/Facial droop/History of CVA and TIA Symptoms concerning for seizure vs CVA. Exam was not consistent with CVA and likely persistent from previous CVA. Daughter felt symptoms are worsened however. Discussed with neurology, Dr. Roseanne Reno , who felt this is likely secondary to seizure. MRI brain was done which did not show any stroke. Patient is back to baseline. Her dose of Keppra was increased to 750 mg twice daily. Swallow screen is pending, but do not anticipate there will be any problem with the same. Patient was in a skilled nursing facility and may go back there. Family requesting new facility. Social worker is working on it right now.  Acute kidney injury Patient  was gently hydrated. Renal function is back to baseline.   Equivocal C. Difficile testing Unclear why she was tested. No reports of diarrhea. C. difficile antigen is positive. Toxin is negative. PCR is positive. Patient does not have any diarrhea at this time. She was hospitalized in October with partial small bowel obstruction. Loose stools, if any, that she may have had at that time could be reflective of improving partial small bowel obstruction. Patient has not had any diarrhea since she has been in the hospital. Discussed also with her nurse practitioner at the skilled nursing facility. They also did not report any diarrhea recently. No indication to treat at this time. Skilled nursing facility to monitor her closely. If she does develop severe watery stool, then consideration may be given to treating it.  Seizures Keppra dose was increased  Atrial fibrillation Rate controlled and bradycardic. Continue home medications. CHADS2vasc score of 8. Patient may resume her home dose of Eliquis.  Heart failure with reduced EF Last EF of 40-45% on 10/06/2015. Patient on a beta blocker. No ACEi or ARB currently prescribed. Continue Lasix. Defer further management outpatient providers.  Dementia Continue donepezil  Depression Continue home medications  Overall improved. Stable for discharge back to skilled nursing facility. Social worker is aware. Family requesting new facility. Hopefully will be able to go today.   PERTINENT LABS:  The results of significant diagnostics from this hospitalization (including imaging, microbiology, ancillary and laboratory) are listed below for reference.    Microbiology: Recent Results (from the past 240 hour(s))  C difficile quick scan w PCR reflex     Status: Abnormal   Collection Time: 11/14/15  3:39 PM  Result Value Ref Range Status   C Diff antigen POSITIVE (A) NEGATIVE Final   C Diff toxin NEGATIVE NEGATIVE Final   C Diff interpretation Results are  indeterminate. See PCR results.  Final  Clostridium Difficile by PCR     Status: Abnormal   Collection Time: 11/14/15  3:39 PM  Result Value Ref Range Status   Toxigenic C Difficile by pcr POSITIVE (A) NEGATIVE Final    Comment: Positive for toxigenic C. difficile with little to no toxin production. Only treat if clinical presentation suggests symptomatic illness.     Labs: Basic Metabolic Panel:  Recent Labs Lab 11/14/15 1503 11/15/15 0642  NA 138 138  K 4.1 3.6  CL 101 102  CO2 29 29  GLUCOSE 105* 92  BUN 11 7  CREATININE 1.06* 0.87  CALCIUM 9.3 9.0   Liver Function Tests:  Recent Labs Lab 11/14/15 1503  AST 18  ALT 11*  ALKPHOS 85  BILITOT 0.5  PROT 6.1*  ALBUMIN 3.0*   CBC:  Recent Labs Lab 11/14/15 1503  WBC 6.3  NEUTROABS 4.4  HGB 9.8*  HCT 28.8*  MCV 84.2  PLT 191   CBG:  Recent Labs Lab 11/14/15 1313 11/14/15 1518 11/15/15 0814  GLUCAP 114* 104* 90     IMAGING STUDIES Dg Chest 2 View  Result Date: 11/14/2015 CLINICAL DATA:  Altered mental status. EXAM: CHEST  2 VIEW COMPARISON:  Chest radiograph 10/28/2015 FINDINGS: Cardiomediastinal contours are unchanged. Diffusely increased pulmonary markings remain. There is no focal airspace consolidation. No pneumothorax or sizable pleural effusion. IMPRESSION: 1. No radiographic evidence of pneumonia. 2. Unchanged findings of chronic lung disease with diffusely increased pulmonary markings and bibasilar no nodular opacities. Electronically Signed   By: Deatra Robinson M.D.   On: 11/14/2015 14:16   Ct Head Wo Contrast  Result Date: 11/14/2015 CLINICAL DATA:  Altered mental status/ lethargy.  Left arm weakness EXAM: CT HEAD WITHOUT CONTRAST TECHNIQUE: Contiguous axial images were obtained from the base of the skull through the vertex without intravenous contrast. COMPARISON:  Brain MRI October 03, 2015 FINDINGS: Brain: There is moderate diffuse atrophy, unchanged. Note that there is fairly severe  cerebellar atrophy, likewise stable. There is no intracranial mass, hemorrhage, extra-axial fluid collection, or midline shift. There is patchy small vessel disease in the centra semiovale bilaterally. There is no new gray-white compartment lesion. No acute infarct evident. Vascular: There is no demonstrable hyperdense vessel. There is calcification in the carotid siphon regions bilaterally. Skull: The bony calvarium appears intact. Sinuses/Orbits: Visualized paranasal sinuses are clear. Orbits appear symmetric bilaterally. Other: Mastoid air cells are clear. IMPRESSION: Diffuse atrophy, stable. Patchy periventricular small vessel disease, stable. No acute infarct is demonstrable on this study. No hemorrhage, mass, or extra-axial fluid collection. Carotid siphon region vascular calcification evident bilaterally. Electronically Signed   By: Bretta Bang III M.D.   On: 11/14/2015 13:54   Mr Brain Wo Contrast  Result Date: 11/14/2015 CLINICAL DATA:  Altered mental status and lethargy EXAM: MRI HEAD WITHOUT CONTRAST TECHNIQUE: Multiplanar, multiecho pulse sequences of the brain and surrounding structures were obtained without intravenous contrast. COMPARISON:  Head CT 11/14/2015, brain MRI 10/03/2015 FINDINGS: Brain: No acute infarct or intraparenchymal hemorrhage. The midline structures are normal. There is beginning confluent hyperintense T2-weighted signal within the periventricular white matter, most often seen in the setting of chronic microvascular ischemia. There is diffuse atrophy with mild parietal predominance. No mass lesion or midline shift. No hydrocephalus or extra-axial fluid collection. Vascular: Major  intracranial arterial and venous sinus flow voids are preserved. Chronic hemosiderin deposition within the right caudate body. Skull and upper cervical spine: The visualized skull base, calvarium, upper cervical spine and extracranial soft tissues are normal. Sinuses/Orbits: No fluid levels or  advanced mucosal thickening. No mastoid effusion. Normal orbits. IMPRESSION: 1. No acute intracranial abnormality. 2. Findings of chronic microvascular disease and diffuse atrophy, with mild parietal predominance. Electronically Signed   By: Deatra Robinson M.D.   On: 11/14/2015 19:53   Dg Abd Acute W/chest  Result Date: 10/28/2015 CLINICAL DATA:  Emesis. Recent small bowel obstruction. Evaluate for pneumonia. EXAM: DG ABDOMEN ACUTE W/ 1V CHEST COMPARISON:  Chest radiograph 10/07/2015. CT abdomen/ pelvis 10/01/2015 FINDINGS: Stable cardiomegaly. Stable interstitial and reticular prominence. Reticulonodular opacities at the lung bases, may be calcified, unchanged. No new focal airspace disease. No evidence of free intra-abdominal air. Dilated small bowel loops in the central abdomen measures up to 3.8 cm. Multiple serpiginous and irregular linear opacities projecting over the upper abdomen are likely external to the patient. There are cholecystectomy clips. Post aorta bi-iliac stenting. Unchanged osseous structures. IMPRESSION: 1. Air-filled dilated loops of small bowel in the central abdomen, similar to prior exam. Recurrent small bowel obstruction not excluded. 2. Suspected overlying artifact partially obscuring evaluation of the abdomen. 3. Chronic changes in the lungs with lower lobe predominant reticulonodular opacities that may be calcified. No confluent consolidation to suggest pneumonia. Electronically Signed   By: Rubye Oaks M.D.   On: 10/28/2015 06:50   Dg Abd Portable 1v  Result Date: 10/29/2015 CLINICAL DATA:  Recent small bowel obstruction EXAM: PORTABLE ABDOMEN - 1 VIEW COMPARISON:  October 28, 2015 abdominal radiographs; CT abdomen and pelvis October 01, 2015 FINDINGS: There is currently no appreciable bowel dilatation or air-fluid level to suggest bowel obstruction. No evident free air. There is a stent in the aorta and common iliac arteries. There is a filter in the inferior vena cava  with the apex of the filter at L3-4, stable. There are surgical clips in the right upper quadrant region. Visualized lung bases appear clear. IMPRESSION: Extensive postoperative change. The bowel gas pattern is unremarkable. No demonstrable bowel obstruction or free air. Electronically Signed   By: Bretta Bang III M.D.   On: 10/29/2015 07:14    DISCHARGE EXAMINATION: Vitals:   11/14/15 1830 11/14/15 2021 11/15/15 0433 11/15/15 0926  BP: 116/67 132/66 129/68 122/62  Pulse: 60 66 70 72  Resp: 21 16 19 18   Temp:  97.6 F (36.4 C) 98.5 F (36.9 C) 98.2 F (36.8 C)  TempSrc:    Oral  SpO2: 97% 97% 92% 93%  Weight:  66.2 kg (146 lb)    Height:  5\' 9"  (1.753 m)     General appearance: alert, cooperative, distracted and no distress Resp: clear to auscultation bilaterally Cardio: regular rate and rhythm, S1, S2 normal, no murmur, click, rub or gallop GI: soft, non-tender; bowel sounds normal; no masses,  no organomegaly Extremities: extremities normal, atraumatic, no cyanosis or edema Neurologic: Old left-sided deficits  DISPOSITION: SNF  Discharge Instructions    Call MD for:  difficulty breathing, headache or visual disturbances    Complete by:  As directed    Call MD for:  extreme fatigue    Complete by:  As directed    Call MD for:  persistant dizziness or light-headedness    Complete by:  As directed    Call MD for:  persistant nausea and vomiting    Complete  by:  As directed    Call MD for:  severe uncontrolled pain    Complete by:  As directed    Call MD for:  temperature >100.4    Complete by:  As directed    Discharge instructions    Complete by:  As directed    Take medications as prescribed. Seek attention a few developed severe diarrhea. Your results of C. difficile were equivocal and since you don't have any diarrhea at this time, you are not being treated.  You were cared for by a hospitalist during your hospital stay. If you have any questions about your  discharge medications or the care you received while you were in the hospital after you are discharged, you can call the unit and asked to speak with the hospitalist on call if the hospitalist that took care of you is not available. Once you are discharged, your primary care physician will handle any further medical issues. Please note that NO REFILLS for any discharge medications will be authorized once you are discharged, as it is imperative that you return to your primary care physician (or establish a relationship with a primary care physician if you do not have one) for your aftercare needs so that they can reassess your need for medications and monitor your lab values. If you do not have a primary care physician, you can call [phone] for a physician referral.   Increase activity slowly    Complete by:  As directed       ALLERGIES:  Allergies  Allergen Reactions  . Chocolate Diarrhea  . Lactose Intolerance (Gi) Diarrhea  . Penicillins Other (See Comments)    Reaction:  Unknown  Has patient had a PCN reaction causing immediate rash, facial/tongue/throat swelling, SOB or lightheadedness with hypotension: Unsure Has patient had a PCN reaction causing severe rash involving mucus membranes or skin necrosis: Unsure Has patient had a PCN reaction that required hospitalization:  Unsure  Has patient had a PCN reaction occurring within the last 10 years: Unsure If all of the above answers are "NO", then may proceed with Cephalosporin use.     Current Discharge Medication List    CONTINUE these medications which have CHANGED   Details  levETIRAcetam (KEPPRA) 750 MG tablet Take 1 tablet (750 mg total) by mouth 2 (two) times daily. Qty: 60 tablet, Refills: 0    risperiDONE (RISPERDAL) 1 MG tablet Take 1 tablet (1 mg total) by mouth 2 (two) times daily. Qty: 30 tablet, Refills: 0      CONTINUE these medications which have NOT CHANGED   Details  acetaminophen (TYLENOL) 325 MG tablet Take 650  mg by mouth every 4 (four) hours as needed for mild pain, moderate pain or headache. pain     apixaban (ELIQUIS) 2.5 MG TABS tablet Take 2.5 mg by mouth 2 (two) times daily.    atorvastatin (LIPITOR) 20 MG tablet Take 20 mg by mouth at bedtime.     Calcium Carbonate-Vitamin D (CALCIUM-VITAMIN D) 500-200 MG-UNIT tablet Take 1 tablet by mouth daily.     diltiazem (TIAZAC) 360 MG 24 hr capsule Take 360 mg by mouth daily.    donepezil (ARICEPT) 10 MG tablet Take 10 mg by mouth at bedtime.     DULoxetine (CYMBALTA) 60 MG capsule Take 60 mg by mouth at bedtime.     furosemide (LASIX) 40 MG tablet Take 40 mg by mouth daily.    guaifenesin (ROBITUSSIN) 100 MG/5ML syrup Take 200 mg by mouth daily  as needed for cough.    Iron-FA-B Cmp-C-Biot-Probiotic (FUSION PLUS) CAPS Take 1 capsule by mouth daily.    magnesium oxide (MAG-OX) 400 (241.3 Mg) MG tablet Take 400 mg by mouth daily.    Menthol-Methyl Salicylate (MUSCLE RUB) 10-15 % CREA Apply 1 application topically 3 (three) times daily as needed (for leg pain).     metoprolol (LOPRESSOR) 100 MG tablet Take 1 tablet (100 mg total) by mouth 2 (two) times daily. Qty: 60 tablet, Refills: 0    Multiple Vitamins-Minerals (MULTIVITAMIN WITH MINERALS) tablet Take 1 tablet by mouth daily.    Nutritional Supplements (ENSURE ENLIVE PO) Take 1 Can by mouth daily before breakfast.    omeprazole (PRILOSEC) 40 MG capsule Take 40 mg by mouth 2 (two) times daily.    potassium chloride SA (K-DUR,KLOR-CON) 20 MEQ tablet Take 1 tablet (20 mEq total) by mouth daily. Qty: 30 tablet, Refills: 0    promethazine (PHENERGAN) 25 MG suppository Place 25 mg rectally every 12 (twelve) hours as needed for nausea or vomiting.    travoprost, benzalkonium, (TRAVATAN) 0.004 % ophthalmic solution Place 1 drop into both eyes at bedtime.      STOP taking these medications     loperamide (IMODIUM) 2 MG capsule      polyethylene glycol (MIRALAX / GLYCOLAX) packet            TOTAL DISCHARGE TIME: 35 mins  Washington Health GreeneKRISHNAN,Yusif Gnau  [facility] Pager [phone]  11/15/2015, 1:55 PM

## 2015-11-15 NOTE — Progress Notes (Signed)
Patient transported to [facility] by [facility]. Ronny Korff, Drinda Butts, Charity fundraiser

## 2015-11-15 NOTE — Evaluation (Signed)
Clinical/Bedside Swallow Evaluation Patient Details  Name: Brandi Heath MRN: [id number] Date of Birth: 18-Oct-1927  Today's Date: 11/15/2015 Time: SLP Start Time (ACUTE ONLY): 1316 SLP Stop Time (ACUTE ONLY): 1330 SLP Time Calculation (min) (ACUTE ONLY): 14 min  Past Medical History:  Past Medical History:  Diagnosis Date  . CHF (congestive heart failure) (HCC)   . Clotting disorder (HCC)   . Coronary artery disease   . Dementia without behavioral disturbance   . DVT (deep venous thrombosis) (HCC)   . Hyperlipidemia   . Hypertension   . OSA (obstructive sleep apnea)   . PE (pulmonary embolism)   . Persistent atrial fibrillation (HCC)   . Polyneuropathy (HCC)   . SBO (small bowel obstruction)   . Seizures (HCC)   . Stroke [facility])    Past Surgical History:  Past Surgical History:  Procedure Laterality Date  . ABDOMINAL AORTIC ANEURYSM REPAIR    . ABDOMINAL HYSTERECTOMY    . CHOLECYSTECTOMY    . SPINAL FUSION  2003   HPI:   Brandi Heath is a 82 y.o. female with medical history significant of CVA, atrial fibrillation, systolic heart failure, dementia, SBO. Patient presented to the ED after having an episode of staring into space and non-responsiveness described by the skilled nursing facility staff. Patient states that she was aware during this episode but could not speak. She reports associated weakness, especially on her left side and in her legs. Her daughter agrees that she appears weaker than previously (patient has a recent TIA and a remote history of CVA with left sided deficits). Daughter feels left facial droop has worsened since previous admission.    Assessment / Plan / Recommendation Clinical Impression  Pt demosntrates adequate ablity to orally manipulate and swallow liquids and pureed solids with no signs of aspiration. She is observed to orally hold her secretions and spit them out instead of swallow them, because they taste bad per pt. Her tongue is coated brown and  black, possibly due to thrush or poor oral care. Due to this and lack of dentition, mastication of solids is slow and incomplete with oral residuals remaining. Recomend pt initiate a puree diet and thin liquids. Will follow for tolerance.     Aspiration Risk  Mild aspiration risk    Diet Recommendation Dysphagia 1 (Puree);Thin liquid   Liquid Administration via: Cup;Straw Medication Administration: Whole meds with liquid Supervision: Staff to assist with self feeding Compensations: Slow rate;Small sips/bites Postural Changes: Seated upright at 90 degrees    Other  Recommendations Oral Care Recommendations: Oral care QID   Follow up Recommendations Skilled Nursing facility      Frequency and Duration min 2x/week  1 week       Prognosis Prognosis for Safe Diet Advancement: Good      Swallow Study   General HPI:  Brandi Heath is a 82 y.o. female with medical history significant of CVA, atrial fibrillation, systolic heart failure, dementia, SBO. Patient presented to the ED after having an episode of staring into space and non-responsiveness described by the skilled nursing facility staff. Patient states that she was aware during this episode but could not speak. She reports associated weakness, especially on her left side and in her legs. Her daughter agrees that she appears weaker than previously (patient has a recent TIA and a remote history of CVA with left sided deficits). Daughter feels left facial droop has worsened since previous admission.  Type of Study: Bedside Swallow Evaluation Previous Swallow Assessment: BSE -  reg thin Diet Prior to this Study: NPO Temperature Spikes Noted: No Respiratory Status: Room air History of Recent Intubation: No Behavior/Cognition: Alert;Cooperative Oral Cavity Assessment: Excessive secretions (coated tongue) Oral Care Completed by SLP: Yes Oral Cavity - Dentition: Edentulous Vision: Functional for self-feeding Self-Feeding Abilities: Needs  assist Patient Positioning: Upright in chair Baseline Vocal Quality: Normal;Low vocal intensity Volitional Cough: Strong Volitional Swallow: Able to elicit    Oral/Motor/Sensory Function Overall Oral Motor/Sensory Function: Within functional limits   Ice Chips     Thin Liquid Thin Liquid: Within functional limits Presentation: Cup;Straw    Nectar Thick Nectar Thick Liquid: Not tested   Honey Thick Honey Thick Liquid: Not tested   Puree Puree: Within functional limits   Solid   GO   Solid: Impaired Oral Phase Impairments: Impaired mastication Oral Phase Functional Implications: Oral residue;Impaired mastication    Functional Assessment Tool Used: clinical judgement Functional Limitations: Swallowing Swallow Current Status ([id number]): At least 1 percent but less than 20 percent impaired, limited or restricted Swallow Goal Status [phone]([id number]): At least 1 percent but less than 20 percent impaired, limited or restricted   Brandi Heath, Brandi Heath 11/15/2015,2:37 PM

## 2015-11-15 NOTE — Evaluation (Signed)
Physical Therapy Evaluation Patient Details Name: Brandi Heath MRN: [id number] DOB: Dec 05, 1927 Today's Date: 11/15/2015   History of Present Illness  Brandi Heath is a 82 y.o. female with medical history significant of CVA, atrial fibrillation, systolic heart failure, dementia, SBO, admitted to the ED after having an episode of staring into space and non-responsiveness described by the skilled nursing facility staff.   Suspected seizure.  Clinical Impression  Pt admitted with/for seizure- like activity with unresponsiveness.  Pt currently limited functionally due to the problems listed. ( See problems list.)   Pt will benefit from PT to maximize function and safety in order to get ready for next venue listed below.     Follow Up Recommendations SNF    Equipment Recommendations  None recommended by PT    Recommendations for Other Services       Precautions / Restrictions Precautions Precautions: Fall Precaution Comments: incontinent Restrictions Weight Bearing Restrictions: No      Mobility  Bed Mobility Overal bed mobility: Needs Assistance Bed Mobility: Supine to Sit     Supine to sit: Mod assist     General bed mobility comments: cues for direction and for scoot plus assist to come to sitting and more assist to scoot  Transfers Overall transfer level: Needs assistance Equipment used: Rolling walker (2 wheeled) Transfers: Sit to/from [employer] Sit to Stand: Mod assist (need for 2 person assist, but not available) Stand pivot transfers: Mod assist;+2 safety/equipment       General transfer comment: pt sit to stand x4 with cues for hand placment and assist to stand.  Every stand pt with urinary incontinence.  Assisted with w/shift and maneuvering RW  Ambulation/Gait             General Gait Details: not applicable today  Stairs            Wheelchair Mobility    Modified Rankin (Stroke Patients Only)       Balance Overall balance  assessment: Needs assistance Sitting-balance support: Bilateral upper extremity supported;Single extremity supported Sitting balance-Leahy Scale: Poor (poor initially to fair after upright) Sitting balance - Comments: easy to challenge balance and make her fall over     Standing balance-Leahy Scale: Zero                               Pertinent Vitals/Pain Pain Assessment: No/denies pain    Home Living Family/patient expects to be discharged to:: Skilled nursing facility                      Prior Function Level of Independence: Needs assistance   Gait / Transfers Assistance Needed: assisted by staff  ADL's / Homemaking Assistance Needed: expect assist        Hand Dominance        Extremity/Trunk Assessment   Upper Extremity Assessment: Generalized weakness           Lower Extremity Assessment: Generalized weakness         Communication   Communication: No difficulties  Cognition Arousal/Alertness: Awake/alert Behavior During Therapy: WFL for tasks assessed/performed Overall Cognitive Status: History of cognitive impairments - at baseline                      General Comments      Exercises     Assessment/Plan    PT Assessment Patient needs continued PT services  PT  Problem List Decreased activity tolerance;Decreased strength;Decreased balance;Decreased cognition;Decreased mobility;Decreased knowledge of use of DME          PT Treatment Interventions DME instruction;Gait training;Functional mobility training;Therapeutic activities;Balance training;Patient/family education    PT Goals (Current goals can be found in the Care Plan section)  Acute Rehab PT Goals Patient Stated Goal: pt unable to state PT Goal Formulation: With patient Time For Goal Achievement: 11/07/15 Potential to Achieve Goals: Good    Frequency Min 2X/week   Barriers to discharge        Co-evaluation               End of Session    Activity Tolerance: Patient limited by fatigue Patient left: in chair;with call bell/phone within reach;with chair alarm set Nurse Communication: Mobility status    Functional Assessment Tool Used: clinical judgement Functional Limitation: Mobility: Walking and moving around Mobility: Walking and Moving Around Current Status ([id number](G8978): At least 20 percent but less than 40 percent impaired, limited or restricted Mobility: Walking and Moving Around Goal Status [phone](G8979): At least 20 percent but less than 40 percent impaired, limited or restricted    Time: [id number] PT Time Calculation (min) (ACUTE ONLY): 46 min   Charges:   PT Evaluation $PT Eval Moderate Complexity: 1 Procedure PT Treatments $Therapeutic Activity: 23-37 mins   PT G Codes:   PT G-Codes **NOT FOR INPATIENT CLASS** Functional Assessment Tool Used: clinical judgement Functional Limitation: Mobility: Walking and moving around Mobility: Walking and Moving Around Current Status ([id number](G8978): At least 20 percent but less than 40 percent impaired, limited or restricted Mobility: Walking and Moving Around Goal Status [phone](G8979): At least 20 percent but less than 40 percent impaired, limited or restricted    Mattix Imhof, Eliseo GumKenneth V 11/15/2015, 4:55 PM  11/15/2015  [place] BingKen Rahiem Schellinger, PT [phone]  (pager)

## 2015-11-15 NOTE — Care Management Obs Status (Signed)
MEDICARE OBSERVATION STATUS NOTIFICATION   Patient Details  Name: Brandi Heath MRN: [id number] Date of Birth: 03/08/1927   Medicare Observation Status Notification Given:  Yes  Pt unable to discuss this notification.   Toma Arts, Annamarie Major, RN 11/15/2015, 12:53 PM

## 2015-11-15 NOTE — NC FL2 (Deleted)
[place] MEDICAID FL2 LEVEL OF CARE SCREENING TOOL     IDENTIFICATION  Patient Name: Brandi Heath Birthdate: 06/02/1927 Sex: female Admission Date (Current Location): 11/14/2015  [facility] and [place] Number:  Producer, television/film/video and Address:  [facility] [place], [address], [place], [place] [address]      Provider Number: [id number]  Attending Physician Name and Address:  Osvaldo Shipper, MD  Relative Name and Phone Number:  Lynden Oxford (daughter) [phone]    Current Level of Care: Hospital Recommended Level of Care: Skilled Nursing Facility Prior Approval Number:    Date Approved/Denied:   PASRR Number: [id number]  Discharge Plan: SNF    Current Diagnoses: Patient Active Problem List   Diagnosis Date Noted  . Acute kidney injury (HCC) 11/14/2015  . Non-ischemic cardiomyopathy (HCC) 11/11/2015  . MR (mitral regurgitation) 11/11/2015  . Atrial fibrillation with RVR (HCC) 10/28/2015  . Acute respiratory failure with hypoxia (HCC) 10/17/2015  . Slurred speech   . Left sided numbness   . Acute on chronic diastolic heart failure (HCC) 10/04/2015  . Normocytic anemia 10/04/2015  . Thrombocytopenia (HCC) 10/04/2015  . TIA (transient ischemic attack) 10/04/2015  . Cough 10/02/2015  . SBO (small bowel obstruction) 10/01/2015  . Chronic CHF (congestive heart failure) (HCC) 09/29/2015  . Venous insufficiency (chronic) (peripheral) 09/29/2015  . Vitamin B12 deficiency 09/29/2015  . Anemia, chronic disease 09/29/2015  . Enterococcus UTI 09/02/2015  . GERD (gastroesophageal reflux disease) 09/02/2015  . Adynamic ileus (HCC) 06/11/2015  . CHF, acute on chronic (HCC) 06/11/2015  . Personal history of DVT (deep vein thrombosis) 06/11/2015  . Non-intractable vomiting with nausea 06/03/2015  . Jaw pain 05/27/2015  . E. coli UTI 04/22/2015  . Prerenal acute renal failure (HCC) 04/22/2015  . History of CVA (cerebrovascular accident) 04/22/2015  . AF  (atrial fibrillation) (HCC) 04/22/2015  . Polyneuropathy (HCC) 04/22/2015  . Edema 01/25/2015  . Atrial fibrillation with rapid ventricular response (HCC) 01/20/2015  . Acute encephalopathy 01/20/2015  . UTI (urinary tract infection) 01/20/2015  . Tracheobronchomalacia 01/20/2015  . Personal history of PE (pulmonary embolism) 01/20/2015  . Depression 01/20/2015  . OSA (obstructive sleep apnea)   . Dementia with behavioral disturbance   . Seizures (HCC)   . Hyperlipidemia   . Hypertensive heart disease with CHF (congestive heart failure) (HCC)   . Clotting disorder (HCC)   . Cerebrovascular accident (CVA) due to thrombosis (HCC)     Orientation RESPIRATION BLADDER Height & Weight     Self, Time, Situation, Place  Normal Incontinent Weight: 146 lb (66.2 kg) Height:  5\' 9"  (175.3 cm)  BEHAVIORAL SYMPTOMS/MOOD NEUROLOGICAL BOWEL NUTRITION STATUS    Convulsions/Seizures Incontinent Diet (Regular Diet)  AMBULATORY STATUS COMMUNICATION OF NEEDS Skin   Extensive Assist Patient uses a wheelchair. Verbally Normal                       Personal Care Assistance Level of Assistance  Bathing, Feeding, Dressing Bathing Assistance: Maximum assistance Feeding assistance: Independent assistance (needs set up) Dressing Assistance: Maximum assistance     Functional Limitations Info  Sight, Hearing, Speech Sight Info: Adequate Hearing Info: Impaired Speech Info: Adequate    SPECIAL CARE FACTORS FREQUENCY   PT (by licensed PT)  PT Eval completed on 11/15/2015.                   Contractures Contractures Info: Not present    Additional Factors Info  Code Status,  Allergies Code Status Info: DNR Allergies Info: Chocolate, Lactose Intolerance (Gi), Penicillins           Current Medications (11/15/2015):  This is the current hospital active medication list Current Facility-Administered Medications  Medication Dose Route Frequency Provider Last Rate Last Dose  . 0.9 %   sodium chloride infusion   Intravenous Continuous Narda Bondsalph A Nettey, MD 75 mL/hr at 11/14/15 2112    . apixaban (ELIQUIS) tablet 2.5 mg  2.5 mg Oral BID Narda Bondsalph A Nettey, MD   2.5 mg at 11/14/15 2113  . atorvastatin (LIPITOR) tablet 20 mg  20 mg Oral QHS Narda Bondsalph A Nettey, MD   20 mg at 11/14/15 2113  . diltiazem (CARDIZEM CD) 24 hr capsule 360 mg  360 mg Oral Daily Joaquim NamLisa K Powell, [facility]      . donepezil (ARICEPT) tablet 10 mg  10 mg Oral QHS Narda Bondsalph A Nettey, MD   10 mg at 11/14/15 2113  . DULoxetine (CYMBALTA) DR capsule 60 mg  60 mg Oral QHS Narda Bondsalph A Nettey, MD   60 mg at 11/14/15 2113  . levETIRAcetam (KEPPRA) tablet 750 mg  750 mg Oral BID Narda Bondsalph A Nettey, MD   750 mg at 11/14/15 2113  . magnesium oxide (MAG-OX) tablet 400 mg  400 mg Oral Daily Narda Bondsalph A Nettey, MD      . metoprolol (LOPRESSOR) tablet 100 mg  100 mg Oral BID Narda Bondsalph A Nettey, MD   100 mg at 11/14/15 2113  . MUSCLE RUB CREA 1 application  1 application Topical TID PRN Narda Bondsalph A Nettey, MD      . risperiDONE (RISPERDAL) tablet 1 mg  1 mg Oral BID Narda Bondsalph A Nettey, MD      . Travoprost (BAK Free) (TRAVATAN) 0.004 % ophthalmic solution SOLN 1 drop  1 drop Both Eyes QHS Narda Bondsalph A Nettey, MD         Discharge Medications: Please see discharge summary for a list of discharge medications.  Relevant Imaging Results:  Relevant Lab Results:   Additional Information SSN:  [id number]  [place] LionsLecretia Breklyn Fabrizio, Student-Social Work [phone]

## 2015-11-15 NOTE — Clinical Social Work Note (Signed)
Clinical Social Work Assessment  Patient Details  Name: Brandi Heath MRN: [id number] Date of Birth: Jun 14, 1927  Date of referral:  11/15/15               Reason for consult:  Facility Placement                Permission sought to share information with:  Family Supports Permission granted to share information::  No (Patient has AMS. Talked with daughter Babette Relic)  Name::     Brandi Heath  Agency::     Relationship::  Daughter  Contact Information:  [phone]  Housing/Transportation Living arrangements for the past 2 months:  Skilled Nursing Facility (Patient from [facility]) Source of Information:  Adult Children (Daughter Brandi) Patient Interpreter Needed:  None Criminal Activity/Legal Involvement Pertinent to Current Situation/Hospitalization:  No - Comment as needed Significant Relationships:  Adult Children Lives with:  Facility Resident (From [facility] skilled nursing) Do you feel safe going back to the place where you live?  No (Daughter is requesting patient be discharged to another facility) Need for family participation in patient care:  Yes (Comment)  Care giving concerns:  Daughter aware that patient needs continued care at a skilled facility to help with strengthening.   Social Worker assessment / plan:  CSW talked with Ms. Heath by phone regarding discharge plan for patient. Daughter indicated that patient is not doing to well at current facility and family is concerned about her progress. They are requesting another facility for patient.  Employment status:  Retired Health and safety inspector:  Teacher, English as a foreign language ([employer] and Medicaid) PT Recommendations:  Skilled Holiday representative (From [facility]) Information / Referral to community resources:  Skilled Nursing Facility (Daughter provided with SNF list via email as she would like another facility.)  Patient/Family's Response to care:  Daughter did not express any concerns about patient's care during  hospitalization.  Patient/Family's Understanding of and Emotional Response to Diagnosis, Current Treatment, and Prognosis:  Not discussed.  Emotional Assessment Appearance:  Appears stated age (CSW looked in on patient but did not engage her in conversation as she was sitting up in a chair asleep) Attitude/Demeanor/Rapport:  Unable to Assess Affect (typically observed):  Unable to Assess Orientation:  Oriented to Self Alcohol / Substance use:  Never Used Psych involvement (Current and /or in the community):  No (Comment)  Discharge Needs  Concerns to be addressed:  Discharge Planning Concerns Readmission within the last 30 days:  Yes Current discharge risk:  None Barriers to Discharge:  No Barriers Identified   Cristobal Goldmann, LCSW 11/15/2015, 1:26 PM

## 2015-11-15 NOTE — Clinical Social Work Placement (Signed)
   CLINICAL SOCIAL WORK PLACEMENT  NOTE  Date:  11/15/2015  Patient Details  Name: Brandi Heath MRN: [id number] Date of Birth: November 07, 1927  Clinical Social Work is seeking post-discharge placement for this patient at the Skilled  Nursing Facility level of care (*CSW will initial, date and re-position this form in  chart as items are completed):  Yes   Patient/family provided with [facility] Clinical Social Work Department's list of facilities offering this level of care within the geographic area requested by the patient (or if unable, by the patient's family).      Patient/family informed of their freedom to choose among providers that offer the needed level of care, that participate in Medicare, Medicaid or managed care program needed by the patient, have an available bed and are willing to accept the patient.  Yes   Patient/family informed of [place]'s ownership interest in [facility] and [facility], as well as of the fact that they are under no obligation to receive care at these facilities.  PASRR submitted to EDS on       PASRR number received on       Existing PASRR number confirmed on 11/15/15     FL2 transmitted to all facilities in geographic area requested by pt/family on 11/15/15     FL2 transmitted to all facilities within larger geographic area on       Patient informed that his/her managed care company has contracts with or will negotiate with certain facilities, including the following:  Lacinda Axon     Yes   Patient/family informed of bed offers received.  Patient chooses bed at [facility]     Physician recommends and patient chooses bed at      Patient to be transferred to [facility] on 11/15/15.  Patient to be transferred to facility by Ambulance Sharin Mons)     Patient family notified on 11/15/15 of transfer.  Name of family member notified:  Public house manager (Daughter)     PHYSICIAN       Additional Comment:     _______________________________________________ Renard Hamper, Student-Social Work 11/15/2015, 4:12 PM

## 2015-11-15 NOTE — Progress Notes (Signed)
Patient being discharged to [facility]. Report given to RN at [facility]. [facility] being called for transport.  Avelina Laine RN

## 2015-11-15 NOTE — NC FL2 (Addendum)
[place] MEDICAID FL2 LEVEL OF CARE SCREENING TOOL     IDENTIFICATION  Patient Name: Brandi Heath Birthdate: 24-Apr-1927 Sex: female Admission Date (Current Location): 11/14/2015  [facility] and [place] Number:  Producer, television/film/video and Address:  [facility] [place]. [facility], [address], [place], [place] [address]      Provider Number: [id number]  Attending Physician Name and Address:  Osvaldo Shipper, MD  Relative Name and Phone Number:  Lynden Oxford (daughter) [phone]    Current Level of Care: Hospital Recommended Level of Care: Skilled Nursing Facility Prior Approval Number:    Date Approved/Denied:   PASRR Number: [id number]  Discharge Plan: SNF    Current Diagnoses: Patient Active Problem List   Diagnosis Date Noted  . Acute kidney injury (HCC) 11/14/2015  . Non-ischemic cardiomyopathy (HCC) 11/11/2015  . MR (mitral regurgitation) 11/11/2015  . Atrial fibrillation with RVR (HCC) 10/28/2015  . Acute respiratory failure with hypoxia (HCC) 10/17/2015  . Slurred speech   . Left sided numbness   . Acute on chronic diastolic heart failure (HCC) 10/04/2015  . Normocytic anemia 10/04/2015  . Thrombocytopenia (HCC) 10/04/2015  . TIA (transient ischemic attack) 10/04/2015  . Cough 10/02/2015  . SBO (small bowel obstruction) 10/01/2015  . Chronic CHF (congestive heart failure) (HCC) 09/29/2015  . Venous insufficiency (chronic) (peripheral) 09/29/2015  . Vitamin B12 deficiency 09/29/2015  . Anemia, chronic disease 09/29/2015  . Enterococcus UTI 09/02/2015  . GERD (gastroesophageal reflux disease) 09/02/2015  . Adynamic ileus (HCC) 06/11/2015  . CHF, acute on chronic (HCC) 06/11/2015  . Personal history of DVT (deep vein thrombosis) 06/11/2015  . Non-intractable vomiting with nausea 06/03/2015  . Jaw pain 05/27/2015  . E. coli UTI 04/22/2015  . Prerenal acute renal failure (HCC) 04/22/2015  . History of CVA (cerebrovascular accident) 04/22/2015  . AF  (atrial fibrillation) (HCC) 04/22/2015  . Polyneuropathy (HCC) 04/22/2015  . Edema 01/25/2015  . Atrial fibrillation with rapid ventricular response (HCC) 01/20/2015  . Acute encephalopathy 01/20/2015  . UTI (urinary tract infection) 01/20/2015  . Tracheobronchomalacia 01/20/2015  . Personal history of PE (pulmonary embolism) 01/20/2015  . Depression 01/20/2015  . OSA (obstructive sleep apnea)   . Dementia with behavioral disturbance   . Seizures (HCC)   . Hyperlipidemia   . Hypertensive heart disease with CHF (congestive heart failure) (HCC)   . Clotting disorder (HCC)   . Cerebrovascular accident (CVA) due to thrombosis (HCC)     Orientation RESPIRATION BLADDER Height & Weight     Self, Time, Situation, Place  Normal Incontinent Weight: 146 lb (66.2 kg) Height:  5\' 9"  (175.3 cm)  BEHAVIORAL SYMPTOMS/MOOD NEUROLOGICAL BOWEL NUTRITION STATUS    Convulsions/Seizures Incontinent Diet (Regular Diet)  AMBULATORY STATUS COMMUNICATION OF NEEDS Skin   Extensive Assist (patient uses a wheelchair) Verbally Normal                       Personal Care Assistance Level of Assistance  Bathing, Feeding, Dressing Bathing Assistance: Maximum assistance Feeding assistance: Independent (needs set up) Dressing Assistance: Maximum assistance     Functional Limitations Info  Sight, Hearing, Speech Sight Info: Adequate Hearing Info: Impaired Speech Info: Adequate    SPECIAL CARE FACTORS FREQUENCY    PT (by licensed PT)   PT Eval completed on 11/15/2015.                   Contractures Contractures Info: Not present    Additional Factors Info  Code  Status, Allergies Code Status Info: DNR Allergies Info: Chocolate, Lactose Intolerance (Gi), Penicillins           Current Medications (11/15/2015):  This is the current hospital active medication list Current Facility-Administered Medications  Medication Dose Route Frequency Provider Last Rate Last Dose  . 0.9 %  sodium  chloride infusion   Intravenous Continuous Narda Bondsalph A Nettey, MD 75 mL/hr at 11/14/15 2112    . apixaban (ELIQUIS) tablet 2.5 mg  2.5 mg Oral BID Narda Bondsalph A Nettey, MD   2.5 mg at 11/14/15 2113  . atorvastatin (LIPITOR) tablet 20 mg  20 mg Oral QHS Narda Bondsalph A Nettey, MD   20 mg at 11/14/15 2113  . diltiazem (CARDIZEM CD) 24 hr capsule 360 mg  360 mg Oral Daily Joaquim NamLisa K Powell, [facility]CRPH      . donepezil (ARICEPT) tablet 10 mg  10 mg Oral QHS Narda Bondsalph A Nettey, MD   10 mg at 11/14/15 2113  . DULoxetine (CYMBALTA) DR capsule 60 mg  60 mg Oral QHS Narda Bondsalph A Nettey, MD   60 mg at 11/14/15 2113  . levETIRAcetam (KEPPRA) tablet 750 mg  750 mg Oral BID Narda Bondsalph A Nettey, MD   750 mg at 11/14/15 2113  . magnesium oxide (MAG-OX) tablet 400 mg  400 mg Oral Daily Narda Bondsalph A Nettey, MD      . metoprolol (LOPRESSOR) tablet 100 mg  100 mg Oral BID Narda Bondsalph A Nettey, MD   100 mg at 11/14/15 2113  . MUSCLE RUB CREA 1 application  1 application Topical TID PRN Narda Bondsalph A Nettey, MD      . risperiDONE (RISPERDAL) tablet 1 mg  1 mg Oral BID Narda Bondsalph A Nettey, MD      . Travoprost (BAK Free) (TRAVATAN) 0.004 % ophthalmic solution SOLN 1 drop  1 drop Both Eyes QHS Narda Bondsalph A Nettey, MD         Discharge Medications: Please see discharge summary for a list of discharge medications.  Relevant Imaging Results:  Relevant Lab Results:   Additional Information SSN:  [id number]  [place]apoleon LionsLecretia Jorge Retz, Student-Social Work [phone]

## 2015-11-16 ENCOUNTER — Telehealth: Payer: Self-pay

## 2015-11-16 NOTE — Telephone Encounter (Signed)
Possible re-admission to facility  - This is a patient you were seeing at [facility] fu is needed IF patient was re-admitted to facility upon discharge. Hospital discharge 11/15/15.

## 2015-11-29 ENCOUNTER — Encounter: Payer: Self-pay | Admitting: Internal Medicine

## 2015-11-30 ENCOUNTER — Emergency Department (HOSPITAL_COMMUNITY): Payer: Medicare Other

## 2015-11-30 ENCOUNTER — Inpatient Hospital Stay (HOSPITAL_COMMUNITY)
Admission: EM | Admit: 2015-11-30 | Discharge: 2015-12-25 | DRG: 335 | Disposition: A | Discharge status: Skilled Nursing Facility | Payer: Medicare Other | Attending: General Surgery | Admitting: General Surgery

## 2015-11-30 ENCOUNTER — Inpatient Hospital Stay (HOSPITAL_COMMUNITY): Payer: Medicare Other

## 2015-11-30 ENCOUNTER — Encounter (HOSPITAL_COMMUNITY): Payer: Self-pay | Admitting: Emergency Medicine

## 2015-11-30 DIAGNOSIS — I248 Other forms of acute ischemic heart disease: Secondary | ICD-10-CM | POA: Diagnosis present

## 2015-11-30 DIAGNOSIS — F03918 Unspecified dementia, unspecified severity, with other behavioral disturbance: Secondary | ICD-10-CM | POA: Diagnosis present

## 2015-11-30 DIAGNOSIS — K8689 Other specified diseases of pancreas: Secondary | ICD-10-CM | POA: Diagnosis present

## 2015-11-30 DIAGNOSIS — G9341 Metabolic encephalopathy: Secondary | ICD-10-CM | POA: Diagnosis present

## 2015-11-30 DIAGNOSIS — K5651 Intestinal adhesions [bands], with partial obstruction: Secondary | ICD-10-CM | POA: Diagnosis present

## 2015-11-30 DIAGNOSIS — I5042 Chronic combined systolic (congestive) and diastolic (congestive) heart failure: Secondary | ICD-10-CM | POA: Diagnosis present

## 2015-11-30 DIAGNOSIS — I428 Other cardiomyopathies: Secondary | ICD-10-CM | POA: Diagnosis present

## 2015-11-30 DIAGNOSIS — F05 Delirium due to known physiological condition: Secondary | ICD-10-CM | POA: Diagnosis not present

## 2015-11-30 DIAGNOSIS — E873 Alkalosis: Secondary | ICD-10-CM | POA: Diagnosis present

## 2015-11-30 DIAGNOSIS — Z681 Body mass index (BMI) 19 or less, adult: Secondary | ICD-10-CM | POA: Diagnosis not present

## 2015-11-30 DIAGNOSIS — E870 Hyperosmolality and hypernatremia: Secondary | ICD-10-CM | POA: Diagnosis not present

## 2015-11-30 DIAGNOSIS — D638 Anemia in other chronic diseases classified elsewhere: Secondary | ICD-10-CM | POA: Diagnosis not present

## 2015-11-30 DIAGNOSIS — F0391 Unspecified dementia with behavioral disturbance: Secondary | ICD-10-CM | POA: Diagnosis present

## 2015-11-30 DIAGNOSIS — I340 Nonrheumatic mitral (valve) insufficiency: Secondary | ICD-10-CM | POA: Diagnosis present

## 2015-11-30 DIAGNOSIS — G9340 Encephalopathy, unspecified: Secondary | ICD-10-CM | POA: Diagnosis present

## 2015-11-30 DIAGNOSIS — G4733 Obstructive sleep apnea (adult) (pediatric): Secondary | ICD-10-CM | POA: Diagnosis present

## 2015-11-30 DIAGNOSIS — I959 Hypotension, unspecified: Secondary | ICD-10-CM

## 2015-11-30 DIAGNOSIS — I272 Pulmonary hypertension, unspecified: Secondary | ICD-10-CM | POA: Diagnosis present

## 2015-11-30 DIAGNOSIS — K449 Diaphragmatic hernia without obstruction or gangrene: Secondary | ICD-10-CM | POA: Diagnosis present

## 2015-11-30 DIAGNOSIS — N179 Acute kidney failure, unspecified: Secondary | ICD-10-CM | POA: Diagnosis present

## 2015-11-30 DIAGNOSIS — I4891 Unspecified atrial fibrillation: Secondary | ICD-10-CM | POA: Diagnosis not present

## 2015-11-30 DIAGNOSIS — L8990 Pressure ulcer of unspecified site, unspecified stage: Secondary | ICD-10-CM | POA: Insufficient documentation

## 2015-11-30 DIAGNOSIS — E43 Unspecified severe protein-calorie malnutrition: Secondary | ICD-10-CM | POA: Insufficient documentation

## 2015-11-30 DIAGNOSIS — K219 Gastro-esophageal reflux disease without esophagitis: Secondary | ICD-10-CM | POA: Diagnosis present

## 2015-11-30 DIAGNOSIS — Z515 Encounter for palliative care: Secondary | ICD-10-CM

## 2015-11-30 DIAGNOSIS — Z66 Do not resuscitate: Secondary | ICD-10-CM | POA: Diagnosis present

## 2015-11-30 DIAGNOSIS — Z7189 Other specified counseling: Secondary | ICD-10-CM

## 2015-11-30 DIAGNOSIS — N390 Urinary tract infection, site not specified: Secondary | ICD-10-CM | POA: Diagnosis present

## 2015-11-30 DIAGNOSIS — I481 Persistent atrial fibrillation: Secondary | ICD-10-CM | POA: Diagnosis present

## 2015-11-30 DIAGNOSIS — G629 Polyneuropathy, unspecified: Secondary | ICD-10-CM | POA: Diagnosis present

## 2015-11-30 DIAGNOSIS — L89153 Pressure ulcer of sacral region, stage 3: Secondary | ICD-10-CM | POA: Diagnosis present

## 2015-11-30 DIAGNOSIS — Z0189 Encounter for other specified special examinations: Secondary | ICD-10-CM

## 2015-11-30 DIAGNOSIS — Z86711 Personal history of pulmonary embolism: Secondary | ICD-10-CM

## 2015-11-30 DIAGNOSIS — Z8673 Personal history of transient ischemic attack (TIA), and cerebral infarction without residual deficits: Secondary | ICD-10-CM

## 2015-11-30 DIAGNOSIS — K859 Acute pancreatitis without necrosis or infection, unspecified: Secondary | ICD-10-CM | POA: Diagnosis present

## 2015-11-30 DIAGNOSIS — Z9889 Other specified postprocedural states: Secondary | ICD-10-CM

## 2015-11-30 DIAGNOSIS — Z4659 Encounter for fitting and adjustment of other gastrointestinal appliance and device: Secondary | ICD-10-CM

## 2015-11-30 DIAGNOSIS — E860 Dehydration: Secondary | ICD-10-CM | POA: Diagnosis present

## 2015-11-30 DIAGNOSIS — Z01810 Encounter for preprocedural cardiovascular examination: Secondary | ICD-10-CM | POA: Diagnosis not present

## 2015-11-30 DIAGNOSIS — Z7901 Long term (current) use of anticoagulants: Secondary | ICD-10-CM

## 2015-11-30 DIAGNOSIS — Z981 Arthrodesis status: Secondary | ICD-10-CM

## 2015-11-30 DIAGNOSIS — K56609 Unspecified intestinal obstruction, unspecified as to partial versus complete obstruction: Secondary | ICD-10-CM | POA: Diagnosis not present

## 2015-11-30 DIAGNOSIS — I2510 Atherosclerotic heart disease of native coronary artery without angina pectoris: Secondary | ICD-10-CM | POA: Diagnosis present

## 2015-11-30 DIAGNOSIS — R0602 Shortness of breath: Secondary | ICD-10-CM

## 2015-11-30 DIAGNOSIS — R112 Nausea with vomiting, unspecified: Secondary | ICD-10-CM | POA: Diagnosis present

## 2015-11-30 DIAGNOSIS — E876 Hypokalemia: Secondary | ICD-10-CM

## 2015-11-30 DIAGNOSIS — E785 Hyperlipidemia, unspecified: Secondary | ICD-10-CM | POA: Diagnosis present

## 2015-11-30 DIAGNOSIS — F419 Anxiety disorder, unspecified: Secondary | ICD-10-CM | POA: Diagnosis present

## 2015-11-30 DIAGNOSIS — I110 Hypertensive heart disease with heart failure: Secondary | ICD-10-CM | POA: Diagnosis present

## 2015-11-30 DIAGNOSIS — N178 Other acute kidney failure: Secondary | ICD-10-CM | POA: Diagnosis not present

## 2015-11-30 DIAGNOSIS — G40909 Epilepsy, unspecified, not intractable, without status epilepticus: Secondary | ICD-10-CM | POA: Diagnosis present

## 2015-11-30 DIAGNOSIS — F329 Major depressive disorder, single episode, unspecified: Secondary | ICD-10-CM | POA: Diagnosis present

## 2015-11-30 DIAGNOSIS — Z86718 Personal history of other venous thrombosis and embolism: Secondary | ICD-10-CM

## 2015-11-30 DIAGNOSIS — Z79899 Other long term (current) drug therapy: Secondary | ICD-10-CM

## 2015-11-30 DIAGNOSIS — K567 Ileus, unspecified: Secondary | ICD-10-CM | POA: Diagnosis not present

## 2015-11-30 DIAGNOSIS — F0151 Vascular dementia with behavioral disturbance: Secondary | ICD-10-CM

## 2015-11-30 DIAGNOSIS — N3000 Acute cystitis without hematuria: Secondary | ICD-10-CM

## 2015-11-30 LAB — CBC WITH DIFFERENTIAL/PLATELET
BASOS ABS: 0 10*3/uL (ref 0.0–0.1)
BASOS PCT: 0 %
EOS ABS: 0 10*3/uL (ref 0.0–0.7)
EOS PCT: 0 %
HCT: 41.5 % (ref 36.0–46.0)
Hemoglobin: 14.3 g/dL (ref 12.0–15.0)
LYMPHS PCT: 11 %
Lymphs Abs: 1.2 10*3/uL (ref 0.7–4.0)
MCH: 28.8 pg (ref 26.0–34.0)
MCHC: 34.5 g/dL (ref 30.0–36.0)
MCV: 83.5 fL (ref 78.0–100.0)
Monocytes Absolute: 0.8 10*3/uL (ref 0.1–1.0)
Monocytes Relative: 7 %
Neutro Abs: 8.5 10*3/uL — ABNORMAL HIGH (ref 1.7–7.7)
Neutrophils Relative %: 82 %
PLATELETS: 279 10*3/uL (ref 150–400)
RBC: 4.97 MIL/uL (ref 3.87–5.11)
RDW: 13.6 % (ref 11.5–15.5)
WBC: 10.4 10*3/uL (ref 4.0–10.5)

## 2015-11-30 LAB — COMPREHENSIVE METABOLIC PANEL
ALT: 16 U/L (ref 14–54)
ANION GAP: 17 — AB (ref 5–15)
AST: 24 U/L (ref 15–41)
Albumin: 4.9 g/dL (ref 3.5–5.0)
Alkaline Phosphatase: 86 U/L (ref 38–126)
BILIRUBIN TOTAL: 0.8 mg/dL (ref 0.3–1.2)
BUN: 60 mg/dL — AB (ref 6–20)
CO2: 34 mmol/L — ABNORMAL HIGH (ref 22–32)
Calcium: 10.6 mg/dL — ABNORMAL HIGH (ref 8.9–10.3)
Chloride: 92 mmol/L — ABNORMAL LOW (ref 101–111)
Creatinine, Ser: 3.4 mg/dL — ABNORMAL HIGH (ref 0.44–1.00)
GFR calc Af Amer: 13 mL/min — ABNORMAL LOW (ref >60)
GFR, EST NON AFRICAN AMERICAN: 11 mL/min — AB (ref >60)
Glucose, Bld: 176 mg/dL — ABNORMAL HIGH (ref 65–99)
POTASSIUM: 4.2 mmol/L (ref 3.5–5.1)
Sodium: 143 mmol/L (ref 135–145)
TOTAL PROTEIN: 8.5 g/dL — AB (ref 6.5–8.1)

## 2015-11-30 LAB — URINE MICROSCOPIC-ADD ON

## 2015-11-30 LAB — LIPASE, BLOOD: Lipase: 121 U/L — ABNORMAL HIGH (ref 11–51)

## 2015-11-30 LAB — I-STAT CG4 LACTIC ACID, ED
LACTIC ACID, VENOUS: 2.57 mmol/L — AB (ref 0.5–1.9)
LACTIC ACID, VENOUS: 2.58 mmol/L — AB (ref 0.5–1.9)

## 2015-11-30 LAB — I-STAT TROPONIN, ED: TROPONIN I, POC: 0.04 ng/mL (ref 0.00–0.08)

## 2015-11-30 LAB — URINALYSIS, ROUTINE W REFLEX MICROSCOPIC
Bilirubin Urine: NEGATIVE
Glucose, UA: NEGATIVE mg/dL
Ketones, ur: NEGATIVE mg/dL
Nitrite: NEGATIVE
PROTEIN: NEGATIVE mg/dL
Specific Gravity, Urine: 1.019 (ref 1.005–1.030)
pH: 5.5 (ref 5.0–8.0)

## 2015-11-30 LAB — PROTIME-INR
INR: 1.47
PROTHROMBIN TIME: 17.9 s — AB (ref 11.4–15.2)

## 2015-11-30 LAB — TROPONIN I: TROPONIN I: 0.05 ng/mL — AB (ref <0.03)

## 2015-11-30 MED ORDER — ONDANSETRON HCL 4 MG/2ML IJ SOLN
4.0000 mg | Freq: Four times a day (QID) | INTRAMUSCULAR | Status: DC | PRN
  Administered 2015-12-10 – 2015-12-14 (×3): 4 mg via INTRAVENOUS
  Filled 2015-11-30 (×2): qty 2

## 2015-11-30 MED ORDER — DEXTROSE 5 % IV SOLN
500.0000 mg | Freq: Three times a day (TID) | INTRAVENOUS | Status: DC
  Administered 2015-11-30 – 2015-12-01 (×2): 500 mg via INTRAVENOUS
  Filled 2015-11-30 (×3): qty 0.5

## 2015-11-30 MED ORDER — SODIUM CHLORIDE 0.9 % IV BOLUS (SEPSIS): 1000.0000 mL | Freq: Once | INTRAVENOUS | Status: DC

## 2015-11-30 MED ORDER — SODIUM CHLORIDE 0.9 % IV SOLN
INTRAVENOUS | Status: AC
  Administered 2015-11-30 – 2015-12-01 (×2): via INTRAVENOUS

## 2015-11-30 MED ORDER — SODIUM CHLORIDE 0.9 % IV SOLN
Freq: Once | INTRAVENOUS | Status: AC
  Administered 2015-11-30: 21:00:00 via INTRAVENOUS

## 2015-11-30 MED ORDER — SODIUM CHLORIDE 0.9% FLUSH
3.0000 mL | Freq: Two times a day (BID) | INTRAVENOUS | Status: DC
  Administered 2015-12-01 – 2015-12-21 (×25): 3 mL via INTRAVENOUS

## 2015-11-30 MED ORDER — ONDANSETRON HCL 4 MG PO TABS: 4.0000 mg | ORAL_TABLET | Freq: Four times a day (QID) | ORAL | Status: DC | PRN

## 2015-11-30 MED ORDER — DILTIAZEM HCL 25 MG/5ML IV SOLN
10.0000 mg | Freq: Once | INTRAVENOUS | Status: AC
  Administered 2015-11-30: 10 mg via INTRAVENOUS
  Filled 2015-11-30: qty 5

## 2015-11-30 MED ORDER — ACETAMINOPHEN 325 MG PO TABS
650.0000 mg | ORAL_TABLET | Freq: Four times a day (QID) | ORAL | Status: DC | PRN
  Administered 2015-12-06: 650 mg via ORAL
  Filled 2015-11-30: qty 2

## 2015-11-30 MED ORDER — IOPAMIDOL (ISOVUE-300) INJECTION 61%
INTRAVENOUS | Status: AC
Start: 2015-11-30 — End: 2015-12-01
  Filled 2015-11-30: qty 100

## 2015-11-30 MED ORDER — SODIUM CHLORIDE 0.9 % IJ SOLN
INTRAMUSCULAR | Status: AC
  Filled 2015-11-30: qty 50

## 2015-11-30 MED ORDER — ONDANSETRON HCL 4 MG/2ML IJ SOLN
4.0000 mg | Freq: Once | INTRAMUSCULAR | Status: AC | PRN
  Administered 2015-11-30: 4 mg via INTRAVENOUS
  Filled 2015-11-30: qty 2

## 2015-11-30 MED ORDER — METOPROLOL TARTRATE 5 MG/5ML IV SOLN: 2.5000 mg | Freq: Once | INTRAVENOUS | Status: DC

## 2015-11-30 MED ORDER — DILTIAZEM HCL-DEXTROSE 100-5 MG/100ML-% IV SOLN (PREMIX)
5.0000 mg/h | INTRAVENOUS | Status: DC
  Administered 2015-12-01: 5 mg/h via INTRAVENOUS
  Filled 2015-11-30: qty 100

## 2015-11-30 MED ORDER — HEPARIN (PORCINE) IN NACL 100-0.45 UNIT/ML-% IJ SOLN
950.0000 [IU]/h | INTRAMUSCULAR | Status: DC
  Administered 2015-12-01: 950 [IU]/h via INTRAVENOUS
  Filled 2015-11-30: qty 250

## 2015-11-30 MED ORDER — LEVOFLOXACIN IN D5W 500 MG/100ML IV SOLN
500.0000 mg | INTRAVENOUS | Status: DC
  Administered 2015-12-02: 500 mg via INTRAVENOUS
  Filled 2015-11-30: qty 100

## 2015-11-30 MED ORDER — SODIUM CHLORIDE 0.9 % IV BOLUS (SEPSIS)
1000.0000 mL | Freq: Once | INTRAVENOUS | Status: AC
  Administered 2015-11-30: 1000 mL via INTRAVENOUS

## 2015-11-30 MED ORDER — DEXTROSE 5 % IV SOLN: 2.0000 g | Freq: Once | INTRAVENOUS | Status: DC

## 2015-11-30 MED ORDER — SODIUM CHLORIDE 0.9 % IV SOLN
750.0000 mg | Freq: Two times a day (BID) | INTRAVENOUS | Status: DC
  Administered 2015-12-01 – 2015-12-09 (×18): 750 mg via INTRAVENOUS
  Filled 2015-11-30 (×19): qty 7.5

## 2015-11-30 MED ORDER — LEVOFLOXACIN IN D5W 750 MG/150ML IV SOLN
750.0000 mg | Freq: Once | INTRAVENOUS | Status: AC
  Administered 2015-11-30: 750 mg via INTRAVENOUS
  Filled 2015-11-30: qty 150

## 2015-11-30 MED ORDER — ACETAMINOPHEN 650 MG RE SUPP: 650.0000 mg | Freq: Four times a day (QID) | RECTAL | Status: DC | PRN

## 2015-11-30 NOTE — ED Notes (Signed)
Bed: WA23 Expected date:  Expected time:  Means of arrival:  Comments: EMS-n/v 

## 2015-11-30 NOTE — ED Triage Notes (Signed)
Per EMS. Pt from [facility] nursing [facility]. Hx of dementia. Family told EMS pt had been throwing up for the past 8 hours. LBM yesterday. Generalized abd pain and distention. Hx SBO.

## 2015-11-30 NOTE — ED Notes (Signed)
Report given to Nurse on 2W

## 2015-11-30 NOTE — Progress Notes (Signed)
ANTICOAGULATION CONSULT NOTE - Initial Consult  Pharmacy Consult for Heparin Indication: atrial fibrillation  Allergies  Allergen Reactions  . Chocolate Diarrhea  . Lactose Intolerance (Gi) Diarrhea  . Penicillins Other (See Comments)    Reaction:  Unknown  Has patient had a PCN reaction causing immediate rash, facial/tongue/throat swelling, SOB or lightheadedness with hypotension: Unsure Has patient had a PCN reaction causing severe rash involving mucus membranes or skin necrosis: Unsure Has patient had a PCN reaction that required hospitalization:  Unsure  Has patient had a PCN reaction occurring within the last 10 years: Unsure If all of the above answers are "NO", then may proceed with Cephalosporin use.    Patient Measurements: Weight: 145 lb 15.1 oz (66.2 kg) Heparin Dosing Weight: actual body weight  Vital Signs: Temp: 96.3 F (35.7 C) (11/17 2327) Temp Source: Axillary (11/17 2327) BP: 96/58 (11/17 2327) Pulse Rate: 60 (11/17 2327)  Labs:  Recent Labs  11/30/15 1838 11/30/15 2212  HGB 14.3  --   HCT 41.5  --   PLT 279  --   LABPROT 17.9*  --   INR 1.47  --   CREATININE 3.40*  --   TROPONINI  --  0.05*    Estimated Creatinine Clearance: 12.4 mL/min (by C-G formula based on SCr of 3.4 mg/dL (H)).   Medical History: Past Medical History:  Diagnosis Date  . CHF (congestive heart failure) (HCC)   . Clotting disorder (HCC)   . Coronary artery disease   . Dementia without behavioral disturbance   . DVT (deep venous thrombosis) (HCC)   . Hyperlipidemia   . Hypertension   . OSA (obstructive sleep apnea)   . PE (pulmonary embolism)   . Persistent atrial fibrillation (HCC)   . Polyneuropathy (HCC)   . SBO (small bowel obstruction)   . Seizures (HCC)   . Stroke [facility])     Assessment:  82 yr female known to pharmacy from earlier consult for levaquin and aztreonam dosing for UTI/sepsis.   PMH significant for AFib and patient is on oral anticoagulation  PTA with Eliquis 2.5mg  BID.  Last dose taken confirmed by nursing home was 11/30/15 @ 9am  Patient with small bowel obstruction and orders for nothing by mouth  Pharmacy consulted to dose IV heparin for AFib,while Eliquis is on hold  Goal of Therapy:  Heparin level 0.3-0.7 units/ml  APTT 66-102 sec Monitor platelets by anticoagulation protocol: Yes   Plan:   Obtain baseline aPTT and heparin level (will have added onto previous protime/INR drawn earlier)  No heparin bolus  Begin IV heparin drip @ 950 units/hr  Check aPTT & heparin level 8 hr after heparin started  Due to Eliquis ability to falsely elevate heparin level, will monitor aPTT until heparin level trends down and correlates with aPTT  Follow heparin level & CBC daily  Heraclio Seidman, Joselyn Glassman, PharmD 11/30/2015,11:49 PM

## 2015-11-30 NOTE — Progress Notes (Signed)
Pharmacy Antibiotic Note  Brandi Heath is a 82 y.o. female recently discharged from [facility] on 11/15/15, presented to the ED from nursing home on 11/17 with c/o n/v and abd pain.  UA showed large leukocytes.  Patient has PCN listed as an allergy. To start levaquin and aztreonam for suspected UTI/sepsis.  - afeb, wbc wnl, scr 3.40 (crcl~12)   Plan: - levaquin 750 mg IV x1, then 500 mg IV q48h. - aztreonam 500 mg IV q8h. - f/u renal function ___________________________     Temp (24hrs), Avg:97.4 F (36.3 C), Min:97.4 F (36.3 C), Max:97.4 F (36.3 C)   Recent Labs Lab 11/30/15 1838 11/30/15 1854 11/30/15 1855  WBC 10.4  --   --   CREATININE 3.40*  --   --   LATICACIDVEN  --  2.58* 2.57*    CrCl cannot be calculated (Unknown ideal weight.).    Allergies  Allergen Reactions  . Chocolate Diarrhea  . Lactose Intolerance (Gi) Diarrhea  . Penicillins Other (See Comments)    Reaction:  Unknown  Has patient had a PCN reaction causing immediate rash, facial/tongue/throat swelling, SOB or lightheadedness with hypotension: Unsure Has patient had a PCN reaction causing severe rash involving mucus membranes or skin necrosis: Unsure Has patient had a PCN reaction that required hospitalization:  Unsure  Has patient had a PCN reaction occurring within the last 10 years: Unsure If all of the above answers are "NO", then may proceed with Cephalosporin use.     Thank you for allowing pharmacy to be a part of this patient's care.  Lucia Gaskins 11/30/2015 9:54 PM

## 2015-11-30 NOTE — ED Provider Notes (Addendum)
[facility]-EMERGENCY DEPT Provider Note   CSN: [id number] Arrival date & time: 11/30/15  1808     History   Chief Complaint Chief Complaint  Patient presents with  . Emesis    HPI Brandi Heath is a 82 y.o. female.  HPI   Pt is an 82 yo with vomiting today at [facility]. Hisotyr of dementia. History of SBO. No family present.  Level 5 caveat dementia.   Past Medical History:  Diagnosis Date  . CHF (congestive heart failure) (HCC)   . Clotting disorder (HCC)   . Coronary artery disease   . Dementia without behavioral disturbance   . DVT (deep venous thrombosis) (HCC)   . Hyperlipidemia   . Hypertension   . OSA (obstructive sleep apnea)   . PE (pulmonary embolism)   . Persistent atrial fibrillation (HCC)   . Polyneuropathy (HCC)   . SBO (small bowel obstruction)   . Seizures (HCC)   . Stroke [facility])     Patient Active Problem List   Diagnosis Date Noted  . Acute kidney injury (HCC) 11/14/2015  . Non-ischemic cardiomyopathy (HCC) 11/11/2015  . MR (mitral regurgitation) 11/11/2015  . Atrial fibrillation with RVR (HCC) 10/28/2015  . Acute respiratory failure with hypoxia (HCC) 10/17/2015  . Slurred speech   . Left sided numbness   . Acute on chronic diastolic heart failure (HCC) 10/04/2015  . Normocytic anemia 10/04/2015  . Thrombocytopenia (HCC) 10/04/2015  . TIA (transient ischemic attack) 10/04/2015  . Cough 10/02/2015  . SBO (small bowel obstruction) 10/01/2015  . Chronic CHF (congestive heart failure) (HCC) 09/29/2015  . Venous insufficiency (chronic) (peripheral) 09/29/2015  . Vitamin B12 deficiency 09/29/2015  . Anemia, chronic disease 09/29/2015  . Enterococcus UTI 09/02/2015  . GERD (gastroesophageal reflux disease) 09/02/2015  . Adynamic ileus (HCC) 06/11/2015  . CHF, acute on chronic (HCC) 06/11/2015  . Personal history of DVT (deep vein thrombosis) 06/11/2015  . Non-intractable vomiting with nausea 06/03/2015  . Jaw pain 05/27/2015  . E. coli UTI 04/22/2015   . Prerenal acute renal failure (HCC) 04/22/2015  . History of CVA (cerebrovascular accident) 04/22/2015  . AF (atrial fibrillation) (HCC) 04/22/2015  . Polyneuropathy (HCC) 04/22/2015  . Edema 01/25/2015  . Atrial fibrillation with rapid ventricular response (HCC) 01/20/2015  . Acute encephalopathy 01/20/2015  . UTI (urinary tract infection) 01/20/2015  . Tracheobronchomalacia 01/20/2015  . Personal history of PE (pulmonary embolism) 01/20/2015  . Depression 01/20/2015  . OSA (obstructive sleep apnea)   . Dementia with behavioral disturbance   . Seizures (HCC)   . Hyperlipidemia   . Hypertensive heart disease with CHF (congestive heart failure) (HCC)   . Clotting disorder (HCC)   . Cerebrovascular accident (CVA) due to thrombosis [facility])     Past Surgical History:  Procedure Laterality Date  . ABDOMINAL AORTIC ANEURYSM REPAIR    . ABDOMINAL HYSTERECTOMY    . CHOLECYSTECTOMY    . SPINAL FUSION  2003    OB History    No data available       Home Medications    Prior to Admission medications   Medication Sig Start Date End Date Taking? Authorizing Provider  acetaminophen (TYLENOL) 325 MG tablet Take 650 mg by mouth every 4 (four) hours as needed for mild pain, moderate pain or headache. pain     Historical Provider, MD  apixaban (ELIQUIS) 2.5 MG TABS tablet Take 2.5 mg by mouth 2 (two) times daily.    Historical Provider, MD  atorvastatin (LIPITOR) 20 MG tablet Take  20 mg by mouth at bedtime.     Historical Provider, MD  Calcium Carbonate-Vitamin D (CALCIUM-VITAMIN D) 500-200 MG-UNIT tablet Take 1 tablet by mouth daily.     Historical Provider, MD  diltiazem (TIAZAC) 360 MG 24 hr capsule Take 360 mg by mouth daily.    Historical Provider, MD  donepezil (ARICEPT) 10 MG tablet Take 10 mg by mouth at bedtime.     Historical Provider, MD  DULoxetine (CYMBALTA) 60 MG capsule Take 60 mg by mouth at bedtime.     Historical Provider, MD  furosemide (LASIX) 40 MG tablet Take 40 mg by  mouth daily.    Historical Provider, MD  guaifenesin (ROBITUSSIN) 100 MG/5ML syrup Take 200 mg by mouth daily as needed for cough.    Historical Provider, MD  Iron-FA-B Cmp-C-Biot-Probiotic (FUSION PLUS) CAPS Take 1 capsule by mouth daily.    Historical Provider, MD  levETIRAcetam (KEPPRA) 750 MG tablet Take 1 tablet (750 mg total) by mouth 2 (two) times daily. 11/15/15   Osvaldo ShipperGokul Krishnan, MD  magnesium oxide (MAG-OX) 400 (241.3 Mg) MG tablet Take 400 mg by mouth daily.    Historical Provider, MD  Menthol-Methyl Salicylate (MUSCLE RUB) 10-15 % CREA Apply 1 application topically 3 (three) times daily as needed (for leg pain).     Historical Provider, MD  metoprolol (LOPRESSOR) 100 MG tablet Take 1 tablet (100 mg total) by mouth 2 (two) times daily. 10/08/15   Renae FickleMackenzie Short, MD  Multiple Vitamins-Minerals (MULTIVITAMIN WITH MINERALS) tablet Take 1 tablet by mouth daily.    Historical Provider, MD  Nutritional Supplements (ENSURE ENLIVE PO) Take 1 Can by mouth daily before breakfast.    Historical Provider, MD  omeprazole (PRILOSEC) 40 MG capsule Take 40 mg by mouth 2 (two) times daily.    Historical Provider, MD  potassium chloride SA (K-DUR,KLOR-CON) 20 MEQ tablet Take 1 tablet (20 mEq total) by mouth daily. 10/08/15   Renae FickleMackenzie Short, MD  promethazine (PHENERGAN) 25 MG suppository Place 25 mg rectally every 12 (twelve) hours as needed for nausea or vomiting.    Historical Provider, MD  risperiDONE (RISPERDAL) 1 MG tablet Take 1 tablet (1 mg total) by mouth 2 (two) times daily. 11/15/15   Osvaldo ShipperGokul Krishnan, MD  travoprost, benzalkonium, (TRAVATAN) 0.004 % ophthalmic solution Place 1 drop into both eyes at bedtime.    Historical Provider, MD    Family History Family History  Problem Relation Age of Onset  . Hypertension Mother   . Heart disease Mother   . Heart disease Father   . Cancer Sister     breast  . Diabetes Sister   . Heart disease Son   . Hypertension Son   . Thyroid disease Sister      Social History Social History  Substance Use Topics  . Smoking status: Never Smoker  . Smokeless tobacco: Never Used  . Alcohol use No     Allergies   Chocolate; Lactose intolerance (gi); and Penicillins   Review of Systems Review of Systems  Unable to perform ROS: Dementia     Physical Exam Updated Vital Signs BP 103/80   Pulse 74   Temp 97.4 F (36.3 C)   Resp 15   SpO2 94%   Physical Exam  Constitutional: She appears well-developed and well-nourished.  HENT:  Head: Normocephalic and atraumatic.  Eyes: Right eye exhibits no discharge.  Cardiovascular:  No murmur heard. tachy  Pulmonary/Chest: Effort normal and breath sounds normal. She has no wheezes. She has no  rales.  Abdominal: She exhibits distension. There is tenderness.  Neurological: No cranial nerve deficit.  Eyes closed,   Skin: Skin is warm and dry. She is not diaphoretic.  Nursing note and vitals reviewed.    ED Treatments / Results  Labs (all labs ordered are listed, but only abnormal results are displayed) Labs Reviewed  LIPASE, BLOOD - Abnormal; Notable for the following:       Result Value   Lipase 121 (*)    All other components within normal limits  COMPREHENSIVE METABOLIC PANEL - Abnormal; Notable for the following:    Chloride 92 (*)    CO2 34 (*)    Glucose, Bld 176 (*)    BUN 60 (*)    Creatinine, Ser 3.40 (*)    Calcium 10.6 (*)    Total Protein 8.5 (*)    GFR calc non Af Amer 11 (*)    GFR calc Af Amer 13 (*)    Anion gap 17 (*)    All other components within normal limits  URINALYSIS, ROUTINE W REFLEX MICROSCOPIC (NOT AT [facility]) - Abnormal; Notable for the following:    Color, Urine AMBER (*)    APPearance TURBID (*)    Hgb urine dipstick SMALL (*)    Leukocytes, UA LARGE (*)    All other components within normal limits  CBC WITH DIFFERENTIAL/PLATELET - Abnormal; Notable for the following:    Neutro Abs 8.5 (*)    All other components within normal limits   PROTIME-INR - Abnormal; Notable for the following:    Prothrombin Time 17.9 (*)    All other components within normal limits  URINE MICROSCOPIC-ADD ON - Abnormal; Notable for the following:    Squamous Epithelial / LPF 6-30 (*)    Bacteria, UA MANY (*)    All other components within normal limits  I-STAT CG4 LACTIC ACID, ED - Abnormal; Notable for the following:    Lactic Acid, Venous 2.57 (*)    All other components within normal limits  I-STAT CG4 LACTIC ACID, ED - Abnormal; Notable for the following:    Lactic Acid, Venous 2.58 (*)    All other components within normal limits  URINE CULTURE  I-STAT TROPOININ, ED    EKG  EKG Interpretation  Date/Time:  Friday November 30 2015 18:49:33 EST Ventricular Rate:  141 PR Interval:    QRS Duration: 98 QT Interval:  352 QTC Calculation: 532 R Axis:   87 Text Interpretation:  ST eelvation in avF and 4, 5, 6  a fib with rvr, repeat with slower rate.  Confirmed by Kandis Mannan ([id number]) on 11/30/2015 7:12:29 PM       Radiology Ct Abdomen Pelvis Wo Contrast  Result Date: 11/30/2015 CLINICAL DATA:  Vomiting. Generalized abdominal pain and distention. EXAM: CT ABDOMEN AND PELVIS WITHOUT CONTRAST TECHNIQUE: Multidetector CT imaging of the abdomen and pelvis was performed following the standard protocol without IV contrast. COMPARISON:  10/01/2015 FINDINGS: Lower chest: There is cardiomegaly. Chronic opacities, likely scarring in the lung bases. No effusions. Densely calcified coronary arteries noted. There is a moderate-sized hiatal hernia which is fluid-filled. Hepatobiliary: Prior cholecystectomy.  No focal hepatic abnormality. Pancreas: Diffuse atrophy of the pancreas without visualized focal abnormality or ductal dilatation. Spleen: No focal abnormality.  Normal size. Adrenals/Urinary Tract: Small cyst in the midpole of the right kidney. No hydronephrosis. Adrenal gland is unremarkable. Urinary bladder decompressed. Stomach/Bowel: The  stomach is markedly distended with fluid. There are dilated small bowel loops in the  right abdomen and extending into the pelvis. Transition point is in the mid pelvis on image 64, presumably related to adhesions. Findings compatible with distal small bowel obstruction. Colon is decompressed, grossly unremarkable. Vascular/Lymphatic: Prior stent graft repair of abdominal aortic aneurysm. The aneurysm sac size is 5.1 cm compared with 5.8 cm previously. No adenopathy. Reproductive: Prior hysterectomy.  No adnexal masses. Other: No free fluid or free air. Musculoskeletal: No acute bony abnormality or focal bone lesion. Diffuse degenerative disc and facet disease in the lumbar spine. IMPRESSION: Distal small bowel obstruction, likely related to adhesions. Moderate-sized fluid-filled hiatal hernia. Coronary artery disease, aortic atherosclerosis. Prior stent graft repair of AAA. Decreasing aneurysm sac size since prior study. Electronically Signed   By: Charlett Nose M.D.   On: 11/30/2015 20:25    Procedures Procedures (including critical care time)  Medications Ordered in ED Medications  iopamidol (ISOVUE-300) 61 % injection (not administered)  sodium chloride 0.9 % injection (not administered)  0.9 %  sodium chloride infusion (not administered)  ondansetron (ZOFRAN) injection 4 mg (4 mg Intravenous Given 11/30/15 1839)  sodium chloride 0.9 % bolus 1,000 mL (1,000 mLs Intravenous New Bag/Given 11/30/15 1851)  diltiazem (CARDIZEM) injection 10 mg (10 mg Intravenous Given 11/30/15 1906)     Initial Impression / Assessment and Plan / ED Course  I have reviewed the triage vital signs and the nursing notes.  Pertinent labs & imaging results that were available during my care of the patient were reviewed by me and considered in my medical decision making (see chart for details).  Clinical Course     Pt is a 82 yo female with PMH sig for dementia, HTN, Seizures, SBO, afib and PE on elaquis, here with  abdominal pain and vomiting. Concern for SBO. PE shows distended abdomen with pain to palpation.  Will get labs, CT to fruther characterize.   Labs show elevated AKI- Cr. 3.8 from 1.9, shows elevated lipase.  CT shows bowel obstruction.    Discussed with general surgery, they will follow but she is not a surgical canidate.   Will admit to medicien for hydration, NPO, bowel rest and continued monitoring.   Final Clinical Impressions(s) / ED Diagnoses   Final diagnoses:  None    New Prescriptions New Prescriptions   No medications on file     Courteney Randall An, MD 11/30/15 1841    Courteney Randall An, MD 11/30/15 2105    Courteney Randall An, MD 11/30/15 2124

## 2015-11-30 NOTE — H&P (Signed)
Brandi Heath ZOX:[id number] DOB: [birth year] DOA: 11/30/2015     PCP: Merrilee Seashore, MD   Outpatient Specialists: none  Patient coming from:  From facility [facility].  Chief Complaint: abdominal pain confusion  HPI: Brandi Heath is a 82 y.o. female with medical history significant of  CAD, dementia, DVT, OSA, D, atrial fibrillation on anticoagulation, recurrent small bowel obstruction, CHF nonischemic cardiomyopathy with mitral regurgitation, seizure disorder, recent stroke    Presented with nausea vomiting for at least past 8 hours last normal bowel movement was yesterday patient have had generalized abdominal pain and distention similar to prior SBO  Patient has dementia at baseline although able to communicate and do minimal ADLs and continues to read according to nursing home note. patient unable to provide detailed hx due to confusion. Regarding pertinent Chronic problems: The beginning of the month of November 2017 patient was admitted for symptoms concerning for seizures versus CVA she was seen in consult by neurology was thought to be more likely secondary to seizure MRI of her brain was done did not show any acute stroke the dose of Keppra was increased to 750 mg twice a day   IN ER:  Temp (24hrs), Avg:97.4 F (36.3 C), Min:97.4 F (36.3 C), Max:97.4 F (36.3 C) Respirations 15 oxygen saturation 94% heart rate 74 EKG 107 blood pressure 103/80  Examination initially developed A. fib with RVR which has improved after IV fluids and diltiazem administration. Initial EKG worrisome for ST segment elevations repeat EKG at the lower rate showing resolution of ST segment changes She was found to have urinary tract infection given hour just to penicillin was started on ofloxacin and aztreonam Sodium 143 potassium 4.2 WBC 10.4 hemoglobin 14.3 Creatinine 3.8 up from baseline of 1.9   CT shows bowel obstruction    Following Medications were ordered in ER: Medications    iopamidol (ISOVUE-300) 61 % injection (not administered)  sodium chloride 0.9 % injection (not administered)  levofloxacin (LEVAQUIN) IVPB 750 mg (not administered)  aztreonam (AZACTAM) 2 g in dextrose 5 % 50 mL IVPB (not administered)  ondansetron (ZOFRAN) injection 4 mg (4 mg Intravenous Given 11/30/15 1839)  sodium chloride 0.9 % bolus 1,000 mL (1,000 mLs Intravenous New Bag/Given 11/30/15 1851)  diltiazem (CARDIZEM) injection 10 mg (10 mg Intravenous Given 11/30/15 1906)  0.9 %  sodium chloride infusion ( Intravenous New Bag/Given 11/30/15 2116)     ER provider discussed case with:  General surgery who felt the patient was not to be a surgical candidate conservative management only  Hospitalist was called for admission for small bowel obstruction, dehydration  Review of Systems:    Pertinent positives include: abdominal pain, nausea, vomiting, confusion  Constitutional:  No weight loss, night sweats, Fevers, chills, fatigue, weight loss  HEENT:  No headaches, Difficulty swallowing,Tooth/dental problems,Sore throat,  No sneezing, itching, ear ache, nasal congestion, post nasal drip,  Cardio-vascular:  No chest pain, Orthopnea, PND, anasarca, dizziness, palpitations.no Bilateral lower extremity swelling  GI:  No heartburn, indigestion, , diarrhea, change in bowel habits, loss of appetite, melena, blood in stool, hematemesis Resp:  no shortness of breath at rest. No dyspnea on exertion, No excess mucus, no productive cough, No non-productive cough, No coughing up of blood.No change in color of mucus.No wheezing. Skin:  no rash or lesions. No jaundice GU:  no dysuria, change in color of urine, no urgency or frequency. No straining to urinate.  No flank pain.  Musculoskeletal:  No joint pain or no  joint swelling. No decreased range of motion. No back pain.  Psych:  No change in mood or affect. No depression or anxiety. No memory loss.  Neuro: no localizing neurological  complaints, no tingling, no weakness, no double vision, no gait abnormality, no slurred speech, no   As per HPI otherwise 10 point review of systems negative.   Past Medical History: Past Medical History:  Diagnosis Date  . CHF (congestive heart failure) (HCC)   . Clotting disorder (HCC)   . Coronary artery disease   . Dementia without behavioral disturbance   . DVT (deep venous thrombosis) (HCC)   . Hyperlipidemia   . Hypertension   . OSA (obstructive sleep apnea)   . PE (pulmonary embolism)   . Persistent atrial fibrillation (HCC)   . Polyneuropathy (HCC)   . SBO (small bowel obstruction)   . Seizures (HCC)   . Stroke [facility])    Past Surgical History:  Procedure Laterality Date  . ABDOMINAL AORTIC ANEURYSM REPAIR    . ABDOMINAL HYSTERECTOMY    . CHOLECYSTECTOMY    . SPINAL FUSION  2003     Social History:  Ambulatory   walker     reports that she has never smoked. She has never used smokeless tobacco. She reports that she does not drink alcohol. Her drug history is not on file.  Allergies:   Allergies  Allergen Reactions  . Chocolate Diarrhea  . Lactose Intolerance (Gi) Diarrhea  . Penicillins Other (See Comments)    Reaction:  Unknown  Has patient had a PCN reaction causing immediate rash, facial/tongue/throat swelling, SOB or lightheadedness with hypotension: Unsure Has patient had a PCN reaction causing severe rash involving mucus membranes or skin necrosis: Unsure Has patient had a PCN reaction that required hospitalization:  Unsure  Has patient had a PCN reaction occurring within the last 10 years: Unsure If all of the above answers are "NO", then may proceed with Cephalosporin use.       Family History:   Family History  Problem Relation Age of Onset  . Hypertension Mother   . Heart disease Mother   . Heart disease Father   . Cancer Sister     breast  . Diabetes Sister   . Heart disease Son   . Hypertension Son   . Thyroid disease Sister      Medications: Prior to Admission medications   Medication Sig Start Date End Date Taking? Authorizing Provider  acetaminophen (TYLENOL) 325 MG tablet Take 650 mg by mouth every 4 (four) hours as needed for mild pain, moderate pain or headache. pain     Historical Provider, MD  apixaban (ELIQUIS) 2.5 MG TABS tablet Take 2.5 mg by mouth 2 (two) times daily.    Historical Provider, MD  atorvastatin (LIPITOR) 20 MG tablet Take 20 mg by mouth at bedtime.     Historical Provider, MD  Calcium Carbonate-Vitamin D (CALCIUM-VITAMIN D) 500-200 MG-UNIT tablet Take 1 tablet by mouth daily.     Historical Provider, MD  diltiazem (TIAZAC) 360 MG 24 hr capsule Take 360 mg by mouth daily.    Historical Provider, MD  donepezil (ARICEPT) 10 MG tablet Take 10 mg by mouth at bedtime.     Historical Provider, MD  DULoxetine (CYMBALTA) 60 MG capsule Take 60 mg by mouth at bedtime.     Historical Provider, MD  furosemide (LASIX) 40 MG tablet Take 40 mg by mouth daily.    Historical Provider, MD  guaifenesin (ROBITUSSIN) 100 MG/5ML syrup  Take 200 mg by mouth daily as needed for cough.    Historical Provider, MD  Iron-FA-B Cmp-C-Biot-Probiotic (FUSION PLUS) CAPS Take 1 capsule by mouth daily.    Historical Provider, MD  levETIRAcetam (KEPPRA) 750 MG tablet Take 1 tablet (750 mg total) by mouth 2 (two) times daily. 11/15/15   Osvaldo ShipperGokul Krishnan, MD  magnesium oxide (MAG-OX) 400 (241.3 Mg) MG tablet Take 400 mg by mouth daily.    Historical Provider, MD  Menthol-Methyl Salicylate (MUSCLE RUB) 10-15 % CREA Apply 1 application topically 3 (three) times daily as needed (for leg pain).     Historical Provider, MD  metoprolol (LOPRESSOR) 100 MG tablet Take 1 tablet (100 mg total) by mouth 2 (two) times daily. 10/08/15   Renae FickleMackenzie Short, MD  Multiple Vitamins-Minerals (MULTIVITAMIN WITH MINERALS) tablet Take 1 tablet by mouth daily.    Historical Provider, MD  Nutritional Supplements (ENSURE ENLIVE PO) Take 1 Can by mouth daily  before breakfast.    Historical Provider, MD  omeprazole (PRILOSEC) 40 MG capsule Take 40 mg by mouth 2 (two) times daily.    Historical Provider, MD  potassium chloride SA (K-DUR,KLOR-CON) 20 MEQ tablet Take 1 tablet (20 mEq total) by mouth daily. 10/08/15   Renae FickleMackenzie Short, MD  promethazine (PHENERGAN) 25 MG suppository Place 25 mg rectally every 12 (twelve) hours as needed for nausea or vomiting.    Historical Provider, MD  risperiDONE (RISPERDAL) 1 MG tablet Take 1 tablet (1 mg total) by mouth 2 (two) times daily. 11/15/15   Osvaldo ShipperGokul Krishnan, MD  travoprost, benzalkonium, (TRAVATAN) 0.004 % ophthalmic solution Place 1 drop into both eyes at bedtime.    Historical Provider, MD    Physical Exam: Patient Vitals for the past 24 hrs:  BP Temp Pulse Resp SpO2  11/30/15 2030 103/80 - - 15 94 %  11/30/15 2003 104/85 - 74 18 92 %  11/30/15 1900 - - (!) 55 (!) 28 95 %  11/30/15 1841 106/81 - 60 13 98 %  11/30/15 1822 105/81 97.4 F (36.3 C) 62 16 97 %    1. General:  in No Acute distress 2. Psychological: Alert but not Oriented 3. Head/ENT:    Dry Mucous Membranes                          Head Non traumatic, neck supple                           Poor Dentition 4. SKIN:   decreased Skin turgor,  Skin clean Dry and intact no rash 5. Heart: Regular rate and rhythm  loud Murmur, Lift noted ,no  Rub or gallop 6. Lungs:  no wheezes or crackles   7. Abdomen:   non-tender distended Hyperactive sounds 8. Lower extremities: no clubbing, cyanosis, or edema 9. Neurologically Grossly intact, moving all 4 extremities equally not fully cooperative with neurological exam 10. MSK: Normal range of motion   body mass index is unknown because there is no height or weight on file.  Labs on Admission:   Labs on Admission: I have personally reviewed following labs and imaging studies  CBC:  Recent Labs Lab 11/30/15 1838  WBC 10.4  NEUTROABS 8.5*  HGB 14.3  HCT 41.5  MCV 83.5  PLT 279   Basic  Metabolic Panel:  Recent Labs Lab 11/30/15 1838  NA 143  K 4.2  CL 92*  CO2 34*  GLUCOSE  176*  BUN 60*  CREATININE 3.40*  CALCIUM 10.6*   GFR: CrCl cannot be calculated (Unknown ideal weight.). Liver Function Tests:  Recent Labs Lab 11/30/15 1838  AST 24  ALT 16  ALKPHOS 86  BILITOT 0.8  PROT 8.5*  ALBUMIN 4.9    Recent Labs Lab 11/30/15 1838  LIPASE 121*   No results for input(s): AMMONIA in the last 168 hours. Coagulation Profile:  Recent Labs Lab 11/30/15 1838  INR 1.47   Cardiac Enzymes: No results for input(s): CKTOTAL, CKMB, CKMBINDEX, TROPONINI in the last 168 hours. BNP (last 3 results) No results for input(s): PROBNP in the last 8760 hours. HbA1C: No results for input(s): HGBA1C in the last 72 hours. CBG: No results for input(s): GLUCAP in the last 168 hours. Lipid Profile: No results for input(s): CHOL, HDL, LDLCALC, TRIG, CHOLHDL, LDLDIRECT in the last 72 hours. Thyroid Function Tests: No results for input(s): TSH, T4TOTAL, FREET4, T3FREE, THYROIDAB in the last 72 hours. Anemia Panel: No results for input(s): VITAMINB12, FOLATE, FERRITIN, TIBC, IRON, RETICCTPCT in the last 72 hours. Urine analysis:    Component Value Date/Time   COLORURINE AMBER (A) 11/30/2015 1959   APPEARANCEUR TURBID (A) 11/30/2015 1959   LABSPEC 1.019 11/30/2015 1959   PHURINE 5.5 11/30/2015 1959   GLUCOSEU NEGATIVE 11/30/2015 1959   HGBUR SMALL (A) 11/30/2015 1959   BILIRUBINUR NEGATIVE 11/30/2015 1959   KETONESUR NEGATIVE 11/30/2015 1959   PROTEINUR NEGATIVE 11/30/2015 1959   NITRITE NEGATIVE 11/30/2015 1959   LEUKOCYTESUR LARGE (A) 11/30/2015 1959   Sepsis Labs: @LABRCNTIP (procalcitonin:4,lacticidven:4) )No results found for this or any previous visit (from the past 240 hour(s)).      UA  evidence of UTI      Lab Results  Component Value Date   HGBA1C 5.8 (H) 10/04/2015    CrCl cannot be calculated (Unknown ideal weight.).  BNP (last 3 results) No  results for input(s): PROBNP in the last 8760 hours.   ECG REPORT  Independently reviewed Rate: 141  Rhythm: A. fib with RVR ST&T Change: ST elevations QTC 532  Repeat EKG showing heart rate 121 A. fib with RVR ST elevations resolved QTC down to 462  There were no vitals filed for this visit.   Cultures: No results found for: SDES, SPECREQUEST, CULT, REPTSTATUS   Radiological Exams on Admission: Ct Abdomen Pelvis Wo Contrast  Result Date: 11/30/2015 CLINICAL DATA:  Vomiting. Generalized abdominal pain and distention. EXAM: CT ABDOMEN AND PELVIS WITHOUT CONTRAST TECHNIQUE: Multidetector CT imaging of the abdomen and pelvis was performed following the standard protocol without IV contrast. COMPARISON:  10/01/2015 FINDINGS: Lower chest: There is cardiomegaly. Chronic opacities, likely scarring in the lung bases. No effusions. Densely calcified coronary arteries noted. There is a moderate-sized hiatal hernia which is fluid-filled. Hepatobiliary: Prior cholecystectomy.  No focal hepatic abnormality. Pancreas: Diffuse atrophy of the pancreas without visualized focal abnormality or ductal dilatation. Spleen: No focal abnormality.  Normal size. Adrenals/Urinary Tract: Small cyst in the midpole of the right kidney. No hydronephrosis. Adrenal gland is unremarkable. Urinary bladder decompressed. Stomach/Bowel: The stomach is markedly distended with fluid. There are dilated small bowel loops in the right abdomen and extending into the pelvis. Transition point is in the mid pelvis on image 64, presumably related to adhesions. Findings compatible with distal small bowel obstruction. Colon is decompressed, grossly unremarkable. Vascular/Lymphatic: Prior stent graft repair of abdominal aortic aneurysm. The aneurysm sac size is 5.1 cm compared with 5.8 cm previously. No adenopathy. Reproductive: Prior hysterectomy.  No adnexal masses. Other: No free fluid or free air. Musculoskeletal: No acute bony abnormality  or focal bone lesion. Diffuse degenerative disc and facet disease in the lumbar spine. IMPRESSION: Distal small bowel obstruction, likely related to adhesions. Moderate-sized fluid-filled hiatal hernia. Coronary artery disease, aortic atherosclerosis. Prior stent graft repair of AAA. Decreasing aneurysm sac size since prior study. Electronically Signed   By: Charlett Nose M.D.   On: 11/30/2015 20:25    Chart has been reviewed    Assessment/Plan   82 y.o. female with medical history significant of  CAD, dementia, DVT, OSA, D, atrial fibrillation on anticoagulation, recurrent small bowel obstruction, CHF nonischemic cardiomyopathy with mitral regurgitation, seizure disorder, recent stroke many for small bowel obstruction atrial fibrillation with RVR, UTI severe dehydration and acute renal failure     Present on Admission: . Acute encephalopathy in the setting of severe dehydration complicated by UTI and small bowel obstruction and known history of dementia . Atrial fibrillation with rapid ventricular response (HCC) while nothing by mouth continue on diltiazem drip,  CHADS2vasc score of 8. Given history of CVA we will bridge with heparin while nothing by mouth . SBO (small bowel obstruction) conservative management only case has been discussed by ER provider with general surgery who felt patient was not a candidate for operative intervention.  NG tube ordered attempt to place if able to tolerate for symptomatic management Keep nothing by mouth Order KUB in a.m. Marland Kitchen Dementia with behavioral disturbance expect some degree of sundowning while hospitalized will hold donepezil while nothing by mouth . Hypertensive heart disease with CHF (congestive heart failure) (HCC) Last EF of 40-45% on 10/06/2015. Patient on a beta blocker. No ACEi or ARB patient currently in acute renal failure will hold Lasix due to dehydration. Abnormal EKG this was transient and rate-related patient never endorsed any chest pain  troponin negative for continued to cycle and monitor . UTI (urinary tract infection) patient with unknown allergies to penicillin will treat with Levaquin and Aztreonam . Acute renal failure (ARF) (HCC) in the setting of severe dehydration hold Lasix, rehydrate check urine electrolytes . Dehydration will rehydrate and follow renal function   Other plan as per orders.  DVT prophylaxis:  Heparin  Code Status:    DNR/DNI  as per  family   Family Communication:   Family not  at  Bedside    Disposition Plan:                             Back to current facility when stable                                           Social Work   consulted                          Consults called: general surgery has been consulted by ER MD   Admission status:     Inpatient    Level of care       SDU      I have spent a total of 56 min on this admission  Brandi Heath 11/30/2015, 10:22 PM    [facility]  Pager [phone]   after 2 AM please page floor coverage PA If 7AM-7PM, please contact the day team taking care of the patient  [email or web address]  Password [email or web address]

## 2015-12-01 ENCOUNTER — Inpatient Hospital Stay (HOSPITAL_COMMUNITY): Payer: Medicare Other

## 2015-12-01 DIAGNOSIS — L8990 Pressure ulcer of unspecified site, unspecified stage: Secondary | ICD-10-CM | POA: Insufficient documentation

## 2015-12-01 LAB — COMPREHENSIVE METABOLIC PANEL
ALT: 13 U/L — AB (ref 14–54)
AST: 22 U/L (ref 15–41)
Albumin: 3.3 g/dL — ABNORMAL LOW (ref 3.5–5.0)
Alkaline Phosphatase: 62 U/L (ref 38–126)
Anion gap: 13 (ref 5–15)
BUN: 56 mg/dL — AB (ref 6–20)
CHLORIDE: 100 mmol/L — AB (ref 101–111)
CO2: 33 mmol/L — AB (ref 22–32)
CREATININE: 2.9 mg/dL — AB (ref 0.44–1.00)
Calcium: 9 mg/dL (ref 8.9–10.3)
GFR calc Af Amer: 16 mL/min — ABNORMAL LOW (ref >60)
GFR calc non Af Amer: 14 mL/min — ABNORMAL LOW (ref >60)
GLUCOSE: 96 mg/dL (ref 65–99)
Potassium: 3.7 mmol/L (ref 3.5–5.1)
SODIUM: 146 mmol/L — AB (ref 135–145)
Total Bilirubin: 0.7 mg/dL (ref 0.3–1.2)
Total Protein: 6.7 g/dL (ref 6.5–8.1)

## 2015-12-01 LAB — CBC
HCT: 35 % — ABNORMAL LOW (ref 36.0–46.0)
Hemoglobin: 11.7 g/dL — ABNORMAL LOW (ref 12.0–15.0)
MCH: 28 pg (ref 26.0–34.0)
MCHC: 33.4 g/dL (ref 30.0–36.0)
MCV: 83.7 fL (ref 78.0–100.0)
PLATELETS: 207 10*3/uL (ref 150–400)
RBC: 4.18 MIL/uL (ref 3.87–5.11)
RDW: 13.5 % (ref 11.5–15.5)
WBC: 9.8 10*3/uL (ref 4.0–10.5)

## 2015-12-01 LAB — HEPARIN LEVEL (UNFRACTIONATED)
Heparin Unfractionated: >2.2 IU/mL — ABNORMAL HIGH (ref 0.30–0.70)
Heparin Unfractionated: 1.6 IU/mL — ABNORMAL HIGH (ref 0.30–0.70)

## 2015-12-01 LAB — CREATININE, URINE, RANDOM: Creatinine, Urine: 208.43 mg/dL

## 2015-12-01 LAB — TROPONIN I
Troponin I: 0.1 ng/mL (ref <0.03)
Troponin I: 0.12 ng/mL (ref <0.03)

## 2015-12-01 LAB — APTT
aPTT: 129 seconds — ABNORMAL HIGH (ref 24–36)
aPTT: 139 seconds — ABNORMAL HIGH (ref 24–36)
aPTT: 32 seconds (ref 24–36)

## 2015-12-01 LAB — LACTIC ACID, PLASMA
LACTIC ACID, VENOUS: 1.3 mmol/L (ref 0.5–1.9)
LACTIC ACID, VENOUS: 1.8 mmol/L (ref 0.5–1.9)

## 2015-12-01 LAB — MRSA PCR SCREENING: MRSA by PCR: NEGATIVE

## 2015-12-01 LAB — SODIUM, URINE, RANDOM: Sodium, Ur: 12 mmol/L

## 2015-12-01 LAB — TSH: TSH: 1.243 u[IU]/mL (ref 0.350–4.500)

## 2015-12-01 LAB — PHOSPHORUS: Phosphorus: 4.5 mg/dL (ref 2.5–4.6)

## 2015-12-01 LAB — MAGNESIUM: Magnesium: 1.9 mg/dL (ref 1.7–2.4)

## 2015-12-01 MED ORDER — HEPARIN (PORCINE) IN NACL 100-0.45 UNIT/ML-% IJ SOLN
850.0000 [IU]/h | INTRAMUSCULAR | Status: DC
  Filled 2015-12-01: qty 250

## 2015-12-01 MED ORDER — DIATRIZOATE MEGLUMINE & SODIUM 66-10 % PO SOLN
90.0000 mL | Freq: Once | ORAL | Status: AC
  Administered 2015-12-01: 90 mL via NASOGASTRIC
  Filled 2015-12-01: qty 90

## 2015-12-01 MED ORDER — SODIUM CHLORIDE 0.9 % IV BOLUS (SEPSIS)
250.0000 mL | Freq: Once | INTRAVENOUS | Status: AC
  Administered 2015-12-01: 250 mL via INTRAVENOUS

## 2015-12-01 MED ORDER — AMIODARONE LOAD VIA INFUSION
150.0000 mg | Freq: Once | INTRAVENOUS | Status: AC
  Administered 2015-12-01: 150 mg via INTRAVENOUS
  Filled 2015-12-01: qty 83.34

## 2015-12-01 MED ORDER — AMIODARONE HCL IN DEXTROSE 360-4.14 MG/200ML-% IV SOLN
60.0000 mg/h | INTRAVENOUS | Status: AC
  Administered 2015-12-01 (×2): 60 mg/h via INTRAVENOUS
  Filled 2015-12-01 (×2): qty 200

## 2015-12-01 MED ORDER — SODIUM CHLORIDE 0.9 % IV BOLUS (SEPSIS)
500.0000 mL | Freq: Once | INTRAVENOUS | Status: AC
  Administered 2015-12-01: 500 mL via INTRAVENOUS

## 2015-12-01 MED ORDER — SODIUM CHLORIDE 0.9 % IV SOLN
INTRAVENOUS | Status: DC
  Administered 2015-12-01 – 2015-12-02 (×2): via INTRAVENOUS

## 2015-12-01 MED ORDER — ASPIRIN 300 MG RE SUPP
300.0000 mg | Freq: Once | RECTAL | Status: AC
Start: 2015-12-01 — End: 2015-12-01
  Administered 2015-12-01: 300 mg via RECTAL
  Filled 2015-12-01: qty 1

## 2015-12-01 MED ORDER — SODIUM CHLORIDE 0.9 % IV BOLUS (SEPSIS)
1000.0000 mL | Freq: Once | INTRAVENOUS | Status: AC
  Administered 2015-12-01: 1000 mL via INTRAVENOUS

## 2015-12-01 MED ORDER — CHLORHEXIDINE GLUCONATE 0.12 % MT SOLN
15.0000 mL | Freq: Two times a day (BID) | OROMUCOSAL | Status: DC
  Administered 2015-12-01 – 2015-12-25 (×45): 15 mL via OROMUCOSAL
  Filled 2015-12-01 (×36): qty 15

## 2015-12-01 MED ORDER — ORAL CARE MOUTH RINSE
15.0000 mL | Freq: Two times a day (BID) | OROMUCOSAL | Status: DC
Start: 2015-12-01 — End: 2015-12-25
  Administered 2015-12-01 – 2015-12-23 (×37): 15 mL via OROMUCOSAL

## 2015-12-01 MED ORDER — DIGOXIN 0.25 MG/ML IJ SOLN
0.5000 mg | Freq: Once | INTRAMUSCULAR | Status: AC
  Administered 2015-12-01: 0.5 mg via INTRAVENOUS
  Filled 2015-12-01: qty 2

## 2015-12-01 MED ORDER — AMIODARONE HCL IN DEXTROSE 360-4.14 MG/200ML-% IV SOLN
30.0000 mg/h | INTRAVENOUS | Status: AC
  Administered 2015-12-01: 45 mg/h via INTRAVENOUS
  Administered 2015-12-02 (×2): 30 mg/h via INTRAVENOUS
  Filled 2015-12-01 (×2): qty 200

## 2015-12-01 MED ORDER — AMIODARONE HCL IN DEXTROSE 360-4.14 MG/200ML-% IV SOLN
30.0000 mg/h | INTRAVENOUS | Status: DC
  Filled 2015-12-01: qty 200

## 2015-12-01 MED ORDER — HEPARIN (PORCINE) IN NACL 100-0.45 UNIT/ML-% IJ SOLN
1050.0000 [IU]/h | INTRAMUSCULAR | Status: DC
  Administered 2015-12-01 – 2015-12-04 (×4): 700 [IU]/h via INTRAVENOUS
  Administered 2015-12-06 – 2015-12-08 (×2): 950 [IU]/h via INTRAVENOUS
  Administered 2015-12-09: 1050 [IU]/h via INTRAVENOUS
  Filled 2015-12-01 (×8): qty 250

## 2015-12-01 NOTE — Progress Notes (Signed)
ANTICOAGULATION CONSULT NOTE - Follow Up Consult  Pharmacy Consult for Heparin Indication: atrial fibrillation  Patient Measurements: Height: 5\' 9"  (175.3 cm) Weight: 119 lb 7.8 oz (54.2 kg) IBW/kg (Calculated) : 66.2 Heparin Dosing Weight: actual weight  Medications:  Infusions:  . sodium chloride 100 mL/hr at 12/01/15 1643  . amiodarone 30 mg/hr (12/01/15 1720)  . heparin 850 Units/hr (12/01/15 1116)    Assessment: 39 yoF admitted with UTI/sepsis. PMH significant for AFib and patient is on oral anticoagulation PTA with Eliquis, held for NPO status.  Pharmacy consulted to dose IV heparin for AFib, while Eliquis is on hold.   Previous HL supratherapeutic as expected d/t Eliquis.  APTT supratherapeutic at 129  APTT increased to 139, despite Heparin rate decrease after last supratherapeutic level  HL 1.6 remains supratherapeutic as expected d/t Eliquis  Goal of Therapy:  Heparin level 0.3-0.7 units/ml aPTT 66-102 seconds Monitor platelets by anticoagulation protocol: Yes   Plan:   Hold heparin drip x 1 hour (21:30-22:30)  Decrease heparin IV infusion to 700 units/hr  Recheck APTT and heparin level in 8 hours after rate change  Daily heparin level and CBC   Lynann Beaver PharmD, BCPS Pager [phone] 12/01/2015 8:25 PM

## 2015-12-01 NOTE — Consult Note (Signed)
Reason for Consult: "SBO" Referring Physician: Elgergawy  Brandi Heath is an 82 y.o. female.  HPI:  Patient is an 82 yo F admitted to the hospital yesterday with <24 hour h/o nausea/vomiting/abdominal pain/distention.  She has h/o CAD, dementia, DVT, OSA, atrial fibrillation on anticoagulation, CHF, seizures, and recent CVA.  She lives in a SNF in [place].  She was seen to be more confused than normal.  She was admitted to the hospital early in November for seizures vs CVA.    She had h/o several abdominal surgeries.  She was unable to give much history.  At SNF, she is able to assist with ADLs and read.   She was also found to have atrial fibrillation with RVR in hospital.  She has not had fever/chills.  She has been having normal bowel movements until yesterday.  She has prior h/o SBO.    Ct was performed showing dilated SB, pancreatic atrophy, hiatal hernia.  NGT with copious bilious output. Nursing reports stool yesterday.  Past Medical History:  Diagnosis Date  . CHF (congestive heart failure) ([place])   . Clotting disorder ([place])   . Coronary artery disease   . Dementia without behavioral disturbance   . DVT (deep venous thrombosis) ([place])   . Hyperlipidemia   . Hypertension   . OSA (obstructive sleep apnea)   . PE (pulmonary embolism)   . Persistent atrial fibrillation ([place])   . Polyneuropathy ([place])   . SBO (small bowel obstruction)   . Seizures ([place])   . Stroke [facility])     Past Surgical History:  Procedure Laterality Date  . ABDOMINAL AORTIC ANEURYSM REPAIR    . ABDOMINAL HYSTERECTOMY    . CHOLECYSTECTOMY    . SPINAL FUSION  2003    Family History  Problem Relation Age of Onset  . Hypertension Mother   . Heart disease Mother   . Heart disease Father   . Cancer Sister     breast  . Diabetes Sister   . Heart disease Son   . Hypertension Son   . Thyroid disease Sister     Social History:  reports that she has never smoked. She has never used smokeless tobacco. She reports  that she does not drink alcohol. Her drug history is not on file.  Allergies:  Allergies  Allergen Reactions  . Chocolate Diarrhea  . Lactose Intolerance (Gi) Diarrhea  . Penicillins Other (See Comments)    Reaction:  Unknown  Has patient had a PCN reaction causing immediate rash, facial/tongue/throat swelling, SOB or lightheadedness with hypotension: Unsure Has patient had a PCN reaction causing severe rash involving mucus membranes or skin necrosis: Unsure Has patient had a PCN reaction that required hospitalization:  Unsure  Has patient had a PCN reaction occurring within the last 10 years: Unsure If all of the above answers are "NO", then may proceed with Cephalosporin use.    Medications:  Prior to Admission:  Prescriptions Prior to Admission  Medication Sig Dispense Refill Last Dose  . acetaminophen (TYLENOL) 325 MG tablet Take 650 mg by mouth 3 (three) times daily. pain    11/30/2015 at Unknown time  . apixaban (ELIQUIS) 2.5 MG TABS tablet Take 2.5 mg by mouth 2 (two) times daily.   11/30/2015 at 0900  . Calcium Carbonate-Vitamin D (CALCIUM-VITAMIN D) 500-200 MG-UNIT tablet Take 1 tablet by mouth daily.    11/30/2015 at Unknown time  . Cranberry 450 MG TABS Take 450 mg by mouth 2 (two) times daily.  11/30/2015 at Unknown time  . diltiazem (TIAZAC) 360 MG 24 hr capsule Take 360 mg by mouth daily.   11/30/2015 at Unknown time  . docusate sodium (COLACE) 100 MG capsule Take 100 mg by mouth 2 (two) times daily.   11/30/2015 at Unknown time  . donepezil (ARICEPT) 10 MG tablet Take 10 mg by mouth at bedtime.    11/29/2015 at Unknown time  . DULoxetine (CYMBALTA) 60 MG capsule Take 60 mg by mouth at bedtime.    11/29/2015  . furosemide (LASIX) 40 MG tablet Take 40 mg by mouth daily.   11/30/2015 at Unknown time  . guaifenesin (ROBITUSSIN) 100 MG/5ML syrup Take 200 mg by mouth daily as needed for cough.   Past Month at Unknown time  . Iron-FA-B Cmp-C-Biot-Probiotic (FUSION PLUS) CAPS  Take 1 capsule by mouth daily.   11/30/2015 at Unknown time  . levETIRAcetam (KEPPRA) 750 MG tablet Take 1 tablet (750 mg total) by mouth 2 (two) times daily. 60 tablet 0 11/30/2015 at Unknown time  . LORazepam (ATIVAN) 2 MG/ML injection Inject 1 mg into the muscle 3 (three) times daily as needed for seizure.     . magnesium oxide (MAG-OX) 400 (241.3 Mg) MG tablet Take 400 mg by mouth daily.   11/30/2015 at Unknown time  . Menthol-Methyl Salicylate (MUSCLE RUB) 10-15 % CREA Apply 1 application topically 3 (three) times daily as needed (for leg pain).    Past Month at Unknown time  . metoprolol (LOPRESSOR) 100 MG tablet Take 1 tablet (100 mg total) by mouth 2 (two) times daily. 60 tablet 0 11/30/2015 at 0900  . Multiple Vitamins-Minerals (MULTIVITAMIN WITH MINERALS) tablet Take 1 tablet by mouth daily.   11/30/2015 at Unknown time  . Nutritional Supplements (ENSURE ENLIVE PO) Take 1 Can by mouth daily before breakfast.   11/30/2015 at Unknown time  . omeprazole (PRILOSEC) 40 MG capsule Take 40 mg by mouth 2 (two) times daily.   11/30/2015 at Unknown time  . potassium chloride SA (K-DUR,KLOR-CON) 20 MEQ tablet Take 1 tablet (20 mEq total) by mouth daily. 30 tablet 0 11/30/2015 at Unknown time  . promethazine (PHENERGAN) 25 MG suppository Place 25 mg rectally every 12 (twelve) hours as needed for nausea or vomiting.   Past Month at Unknown time  . risperiDONE (RISPERDAL) 1 MG tablet Take 1 tablet (1 mg total) by mouth 2 (two) times daily. 30 tablet 0 11/30/2015 at Unknown time  . travoprost, benzalkonium, (TRAVATAN) 0.004 % ophthalmic solution Place 1 drop into both eyes at bedtime.   11/29/2015  . vitamin C (ASCORBIC ACID) 500 MG tablet Take 500 mg by mouth daily.   11/30/2015 at Unknown time  . atorvastatin (LIPITOR) 20 MG tablet Take 20 mg by mouth at bedtime.    11/29/2015  . ciprofloxacin (CIPRO) 250 MG tablet Take 250 mg by mouth 2 (two) times daily.   Completed Course at Unknown time  .  fluconazole (DIFLUCAN) 150 MG tablet Take 150 mg by mouth once.   Completed Course at Unknown time    Results for orders placed or performed during the hospital encounter of 11/30/15 (from the past 48 hour(s))  Lipase, blood     Status: Abnormal   Collection Time: 11/30/15  6:38 PM  Result Value Ref Range   Lipase 121 (H) 11 - 51 U/L  Comprehensive metabolic panel     Status: Abnormal   Collection Time: 11/30/15  6:38 PM  Result Value Ref Range   Sodium 143  135 - 145 mmol/L   Potassium 4.2 3.5 - 5.1 mmol/L   Chloride 92 (L) 101 - 111 mmol/L   CO2 34 (H) 22 - 32 mmol/L   Glucose, Bld 176 (H) 65 - 99 mg/dL   BUN 60 (H) 6 - 20 mg/dL   Creatinine, Ser 3.40 (H) 0.44 - 1.00 mg/dL   Calcium 10.6 (H) 8.9 - 10.3 mg/dL   Total Protein 8.5 (H) 6.5 - 8.1 g/dL   Albumin 4.9 3.5 - 5.0 g/dL   AST 24 15 - 41 U/L   ALT 16 14 - 54 U/L   Alkaline Phosphatase 86 38 - 126 U/L   Total Bilirubin 0.8 0.3 - 1.2 mg/dL   GFR calc non Af Amer 11 (L) >60 mL/min   GFR calc Af Amer 13 (L) >60 mL/min    Comment: (NOTE) The eGFR has been calculated using the CKD EPI equation. This calculation has not been validated in all clinical situations. eGFR's persistently <60 mL/min signify possible Chronic Kidney Disease.    Anion gap 17 (H) 5 - 15  CBC with Differential/Platelet     Status: Abnormal   Collection Time: 11/30/15  6:38 PM  Result Value Ref Range   WBC 10.4 4.0 - 10.5 K/uL   RBC 4.97 3.87 - 5.11 MIL/uL   Hemoglobin 14.3 12.0 - 15.0 g/dL   HCT 41.5 36.0 - 46.0 %   MCV 83.5 78.0 - 100.0 fL   MCH 28.8 26.0 - 34.0 pg   MCHC 34.5 30.0 - 36.0 g/dL   RDW 13.6 11.5 - 15.5 %   Platelets 279 150 - 400 K/uL   Neutrophils Relative % 82 %   Neutro Abs 8.5 (H) 1.7 - 7.7 K/uL   Lymphocytes Relative 11 %   Lymphs Abs 1.2 0.7 - 4.0 K/uL   Monocytes Relative 7 %   Monocytes Absolute 0.8 0.1 - 1.0 K/uL   Eosinophils Relative 0 %   Eosinophils Absolute 0.0 0.0 - 0.7 K/uL   Basophils Relative 0 %   Basophils  Absolute 0.0 0.0 - 0.1 K/uL  Protime-INR     Status: Abnormal   Collection Time: 11/30/15  6:38 PM  Result Value Ref Range   Prothrombin Time 17.9 (H) 11.4 - 15.2 seconds   INR 1.47   I-Stat CG4 Lactic Acid, ED     Status: Abnormal   Collection Time: 11/30/15  6:54 PM  Result Value Ref Range   Lactic Acid, Venous 2.58 (HH) 0.5 - 1.9 mmol/L   Comment NOTIFIED PHYSICIAN   I-Stat CG4 Lactic Acid, ED     Status: Abnormal   Collection Time: 11/30/15  6:55 PM  Result Value Ref Range   Lactic Acid, Venous 2.57 (HH) 0.5 - 1.9 mmol/L   Comment NOTIFIED PHYSICIAN   I-stat troponin, ED     Status: None   Collection Time: 11/30/15  6:59 PM  Result Value Ref Range   Troponin i, poc 0.04 0.00 - 0.08 ng/mL   Comment 3            Comment: Due to the release kinetics of cTnI, a negative result within the first hours of the onset of symptoms does not rule out myocardial infarction with certainty. If myocardial infarction is still suspected, repeat the test at appropriate intervals.   Urinalysis, Routine w reflex microscopic     Status: Abnormal   Collection Time: 11/30/15  7:59 PM  Result Value Ref Range   Color, Urine AMBER (A) YELLOW  Comment: BIOCHEMICALS MAY BE AFFECTED BY COLOR   APPearance TURBID (A) CLEAR   Specific Gravity, Urine 1.019 1.005 - 1.030   pH 5.5 5.0 - 8.0   Glucose, UA NEGATIVE NEGATIVE mg/dL   Hgb urine dipstick SMALL (A) NEGATIVE   Bilirubin Urine NEGATIVE NEGATIVE   Ketones, ur NEGATIVE NEGATIVE mg/dL   Protein, ur NEGATIVE NEGATIVE mg/dL   Nitrite NEGATIVE NEGATIVE   Leukocytes, UA LARGE (A) NEGATIVE  Urine microscopic-add on     Status: Abnormal   Collection Time: 11/30/15  7:59 PM  Result Value Ref Range   Squamous Epithelial / LPF 6-30 (A) NONE SEEN   WBC, UA TOO NUMEROUS TO COUNT 0 - 5 WBC/hpf   RBC / HPF 0-5 0 - 5 RBC/hpf   Bacteria, UA MANY (A) NONE SEEN  Sodium, urine, random     Status: None   Collection Time: 11/30/15  7:59 PM  Result Value Ref  Range   Sodium, Ur 12 mmol/L    Comment: Performed at [facility]  Creatinine, urine, random     Status: None   Collection Time: 11/30/15  7:59 PM  Result Value Ref Range   Creatinine, Urine 208.43 mg/dL    Comment: Performed at [facility]  Troponin I (q 6hr x 3)     Status: Abnormal   Collection Time: 11/30/15 10:12 PM  Result Value Ref Range   Troponin I 0.05 (HH) <0.03 ng/mL    Comment: REPEATED TO VERIFY CRITICAL RESULT CALLED TO, READ BACK BY AND VERIFIED WITH: S.HOGUE,RN 2324 11/30/15 W.SHEA   MRSA PCR Screening     Status: None   Collection Time: 11/30/15 11:46 PM  Result Value Ref Range   MRSA by PCR NEGATIVE NEGATIVE    Comment:        The GeneXpert MRSA Assay (FDA approved for NASAL specimens only), is one component of a comprehensive MRSA colonization surveillance program. It is not intended to diagnose MRSA infection nor to guide or monitor treatment for MRSA infections.   Lactic acid, plasma     Status: None   Collection Time: 12/01/15 12:09 AM  Result Value Ref Range   Lactic Acid, Venous 1.3 0.5 - 1.9 mmol/L  APTT     Status: None   Collection Time: 12/01/15 12:09 AM  Result Value Ref Range   aPTT 32 24 - 36 seconds  Heparin level (unfractionated)     Status: Abnormal   Collection Time: 12/01/15 12:09 AM  Result Value Ref Range   Heparin Unfractionated >2.20 (H) 0.30 - 0.70 IU/mL    Comment: RESULTS CONFIRMED BY MANUAL DILUTION        IF HEPARIN RESULTS ARE BELOW EXPECTED VALUES, AND PATIENT DOSAGE HAS BEEN CONFIRMED, SUGGEST FOLLOW UP TESTING OF ANTITHROMBIN III LEVELS.   Troponin I (q 6hr x 3)     Status: Abnormal   Collection Time: 12/01/15  3:16 AM  Result Value Ref Range   Troponin I 0.10 (HH) <0.03 ng/mL    Comment: CRITICAL VALUE NOTED.  VALUE IS CONSISTENT WITH PREVIOUSLY REPORTED AND CALLED VALUE.  Lactic acid, plasma     Status: None   Collection Time: 12/01/15  3:16 AM  Result Value Ref Range   Lactic Acid, Venous  1.8 0.5 - 1.9 mmol/L  Magnesium     Status: None   Collection Time: 12/01/15  3:16 AM  Result Value Ref Range   Magnesium 1.9 1.7 - 2.4 mg/dL  Phosphorus     Status:  None   Collection Time: 12/01/15  3:16 AM  Result Value Ref Range   Phosphorus 4.5 2.5 - 4.6 mg/dL  TSH     Status: None   Collection Time: 12/01/15  3:16 AM  Result Value Ref Range   TSH 1.243 0.350 - 4.500 uIU/mL    Comment: Performed by a 3rd Generation assay with a functional sensitivity of <=0.01 uIU/mL.  Comprehensive metabolic panel     Status: Abnormal   Collection Time: 12/01/15  3:16 AM  Result Value Ref Range   Sodium 146 (H) 135 - 145 mmol/L   Potassium 3.7 3.5 - 5.1 mmol/L   Chloride 100 (L) 101 - 111 mmol/L   CO2 33 (H) 22 - 32 mmol/L   Glucose, Bld 96 65 - 99 mg/dL   BUN 56 (H) 6 - 20 mg/dL   Creatinine, Ser 2.90 (H) 0.44 - 1.00 mg/dL   Calcium 9.0 8.9 - 10.3 mg/dL   Total Protein 6.7 6.5 - 8.1 g/dL   Albumin 3.3 (L) 3.5 - 5.0 g/dL   AST 22 15 - 41 U/L   ALT 13 (L) 14 - 54 U/L   Alkaline Phosphatase 62 38 - 126 U/L   Total Bilirubin 0.7 0.3 - 1.2 mg/dL   GFR calc non Af Amer 14 (L) >60 mL/min   GFR calc Af Amer 16 (L) >60 mL/min    Comment: (NOTE) The eGFR has been calculated using the CKD EPI equation. This calculation has not been validated in all clinical situations. eGFR's persistently <60 mL/min signify possible Chronic Kidney Disease.    Anion gap 13 5 - 15  CBC     Status: Abnormal   Collection Time: 12/01/15  3:16 AM  Result Value Ref Range   WBC 9.8 4.0 - 10.5 K/uL   RBC 4.18 3.87 - 5.11 MIL/uL   Hemoglobin 11.7 (L) 12.0 - 15.0 g/dL   HCT 35.0 (L) 36.0 - 46.0 %   MCV 83.7 78.0 - 100.0 fL   MCH 28.0 26.0 - 34.0 pg   MCHC 33.4 30.0 - 36.0 g/dL   RDW 13.5 11.5 - 15.5 %   Platelets 207 150 - 400 K/uL  Troponin I (q 6hr x 3)     Status: Abnormal   Collection Time: 12/01/15  7:55 AM  Result Value Ref Range   Troponin I 0.12 (HH) <0.03 ng/mL    Comment: CRITICAL VALUE NOTED.  VALUE  IS CONSISTENT WITH PREVIOUSLY REPORTED AND CALLED VALUE.  APTT     Status: Abnormal   Collection Time: 12/01/15  7:55 AM  Result Value Ref Range   aPTT 129 (H) 24 - 36 seconds    Comment:        IF BASELINE aPTT IS ELEVATED, SUGGEST PATIENT RISK ASSESSMENT BE USED TO DETERMINE APPROPRIATE ANTICOAGULANT THERAPY.   Heparin level (unfractionated)     Status: Abnormal   Collection Time: 12/01/15  7:55 AM  Result Value Ref Range   Heparin Unfractionated >2.20 (H) 0.30 - 0.70 IU/mL    Comment: RESULTS CONFIRMED BY MANUAL DILUTION        IF HEPARIN RESULTS ARE BELOW EXPECTED VALUES, AND PATIENT DOSAGE HAS BEEN CONFIRMED, SUGGEST FOLLOW UP TESTING OF ANTITHROMBIN III LEVELS.   Heparin level (unfractionated)     Status: Abnormal   Collection Time: 12/01/15  8:12 PM  Result Value Ref Range   Heparin Unfractionated 1.60 (H) 0.30 - 0.70 IU/mL    Comment: RESULTS CONFIRMED BY MANUAL DILUTION  IF HEPARIN RESULTS ARE BELOW EXPECTED VALUES, AND PATIENT DOSAGE HAS BEEN CONFIRMED, SUGGEST FOLLOW UP TESTING OF ANTITHROMBIN III LEVELS.   APTT     Status: Abnormal   Collection Time: 12/01/15  8:12 PM  Result Value Ref Range   aPTT 139 (H) 24 - 36 seconds    Comment:        IF BASELINE aPTT IS ELEVATED, SUGGEST PATIENT RISK ASSESSMENT BE USED TO DETERMINE APPROPRIATE ANTICOAGULANT THERAPY. CONSISTENT WITH PREVIOUS RESULT     Ct Abdomen Pelvis Wo Contrast  Result Date: 11/30/2015 CLINICAL DATA:  Vomiting. Generalized abdominal pain and distention. EXAM: CT ABDOMEN AND PELVIS WITHOUT CONTRAST TECHNIQUE: Multidetector CT imaging of the abdomen and pelvis was performed following the standard protocol without IV contrast. COMPARISON:  10/01/2015 FINDINGS: Lower chest: There is cardiomegaly. Chronic opacities, likely scarring in the lung bases. No effusions. Densely calcified coronary arteries noted. There is a moderate-sized hiatal hernia which is fluid-filled. Hepatobiliary: Prior  cholecystectomy.  No focal hepatic abnormality. Pancreas: Diffuse atrophy of the pancreas without visualized focal abnormality or ductal dilatation. Spleen: No focal abnormality.  Normal size. Adrenals/Urinary Tract: Small cyst in the midpole of the right kidney. No hydronephrosis. Adrenal gland is unremarkable. Urinary bladder decompressed. Stomach/Bowel: The stomach is markedly distended with fluid. There are dilated small bowel loops in the right abdomen and extending into the pelvis. Transition point is in the mid pelvis on image 64, presumably related to adhesions. Findings compatible with distal small bowel obstruction. Colon is decompressed, grossly unremarkable. Vascular/Lymphatic: Prior stent graft repair of abdominal aortic aneurysm. The aneurysm sac size is 5.1 cm compared with 5.8 cm previously. No adenopathy. Reproductive: Prior hysterectomy.  No adnexal masses. Other: No free fluid or free air. Musculoskeletal: No acute bony abnormality or focal bone lesion. Diffuse degenerative disc and facet disease in the lumbar spine. IMPRESSION: Distal small bowel obstruction, likely related to adhesions. Moderate-sized fluid-filled hiatal hernia. Coronary artery disease, aortic atherosclerosis. Prior stent graft repair of AAA. Decreasing aneurysm sac size since prior study. Electronically Signed   By: Rolm Baptise M.D.   On: 11/30/2015 20:25   Dg Abd 1 View  Result Date: 12/01/2015 CLINICAL DATA:  Small bowel obstruction EXAM: ABDOMEN - 1 VIEW COMPARISON:  11/30/2015 FINDINGS: Dilated small bowel loops again noted right abdomen and pelvis compatible with small bowel obstruction, unchanged. NG tube remains in the stomach. Stent graft again noted. IVC filter noted. No free air or visible organomegaly. IMPRESSION: Continued small bowel dilatation compatible with small bowel obstruction. No real change. Electronically Signed   By: Rolm Baptise M.D.   On: 12/01/2015 07:09   Dg Abd Portable 1v-small Bowel  Protocol-position Verification  Result Date: 12/01/2015 CLINICAL DATA:  Followup small bowel obstruction. Nasogastric tube placement. EXAM: PORTABLE ABDOMEN - 1 VIEW COMPARISON:  12/01/2015 FINDINGS: Nasogastric tube is seen with tip in the body the stomach. Moderately dilated small bowel loops in the right abdomen show no significant change compared to recent study. Aortoiliac stent graft again seen as well as embolization coils adjacent to the right iliac limb of the graft. IVC filter again noted. IMPRESSION: Nasogastric tube tip remains in the body the stomach. No significant change in dilated small bowel loops, consistent with small bowel obstruction. Electronically Signed   By: Earle Gell M.D.   On: 12/01/2015 12:49   Dg Abd Portable 1v  Result Date: 11/30/2015 CLINICAL DATA:  Nasogastric tube placement EXAM: PORTABLE ABDOMEN - 1 VIEW COMPARISON:  11/30/2015 CT FINDINGS: The nasogastric  tube extends well into the stomach with tip in the region of the distal gastric body. IMPRESSION: Nasogastric tube extends into the stomach. Electronically Signed   By: Andreas Newport M.D.   On: 11/30/2015 22:55    Review of Systems  Unable to perform ROS: Dementia   Blood pressure 115/78, pulse 95, temperature 98.4 F (36.9 C), resp. rate 12, height 5' 9"  (1.753 m), weight 54.2 kg (119 lb 7.8 oz), SpO2 93 %. Physical Exam  Constitutional: She appears well-developed and well-nourished. She appears listless. No distress.  HENT:  Head: Normocephalic and atraumatic.  Eyes: Conjunctivae are normal. No scleral icterus.  Neck: Neck supple. No tracheal deviation present.  Cardiovascular: Intact distal pulses.  An irregularly irregular rhythm present. Tachycardia present.   Respiratory: Effort normal and breath sounds normal. No respiratory distress.  GI: Soft. Bowel sounds are normal. She exhibits no distension. There is no tenderness. There is no rebound and no guarding.  Neurological: She appears  listless. GCS eye subscore is 1. GCS verbal subscore is 2. GCS motor subscore is 1.  Skin: Skin is warm and dry. No rash noted. She is not diaphoretic. No erythema. No pallor.  Psychiatric:  Unable to assess due to lethargy    Assessment/Plan: Nausea/vomiting Pancreatitis p SBO Atrial fibrillation with RVR Elevated troponins UTI AKI Anticoagulation.  Unclear if patient definitely obstructed vs ileus since patient is unable to communicate when she last passed gas.  She certainly has reason to have ileus with UTI and dehydration. 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  She already has had cholecystectomy and has not had issues.    Continue non operative therapy at this time with NGT drainage/bowel rest.   Will order SB protocol The treatment for pancreatitis and SBO is similar with fluid resuscitation and bowel rest.    Eliquis is on hold in case operative intervention becomes necessary.  She is on a heparin gtt.      UTI per medicine.  Brandi Heath 12/01/2015, 9:34 PM

## 2015-12-01 NOTE — Progress Notes (Signed)
CRITICAL VALUE ALERT  Critical value received: troponin 0.05  Date of notification:  11/30/15  Time of notification:  2212  Critical value read back:Yes.    Nurse who received alert:  America Brown  MD notified (1st page):  [place]  Time of first page:  2300  MD notified (2nd page):  Time of second page:  Responding MD:  Adela Glimpse  Time MD responded:  2300

## 2015-12-01 NOTE — Progress Notes (Signed)
CRITICAL VALUE ALERT  Critical value received:  Troponin 0.1  Date of notification:  12/01/15  Time of notification:  0422  Critical value read back:Yes.    Nurse who received alert:  Effie Berkshire  MD notified (1st page):  [place]  Time of first page:  0422  MD notified (2nd page):  Time of second page:  Responding MD: Susie Cassette  Time MD responded:  [phone]

## 2015-12-01 NOTE — Progress Notes (Signed)
Patient responded well to the initiation of the amiodarone drip with HR decreasing to the 110's and MAP >65. After approximately 1600 HR began trending up. Currently in the 120s with spurts up to the 140s. Dr. Randol Kern made aware. No new orders at this time.

## 2015-12-01 NOTE — Progress Notes (Signed)
PROGRESS NOTE                                                                                                                                                                                                             Patient Demographics:    Brandi Heath, is a 82 y.o. female, DOB - November 20, 1927, UJW:[id number]  Admit date - 11/30/2015   Admitting Physician Therisa DoyneAnastassia Doutova, MD  Outpatient Primary MD for the patient is Merrilee SeashoreAnne Alexander, MD  LOS - 1  Chief Complaint  Patient presents with  . Emesis       Brief Narrative   82 y.o. female with medical history significant of  CAD, dementia, DVT, OSA, D, atrial fibrillation on anticoagulation, recurrent small bowel obstruction, CHF nonischemic cardiomyopathy with mitral regurgitation, seizure disorder, recent stroke, As with abdominal pain, vomiting and altered mental status. Workup significant for small bowel obstruction due to adhesion per CT report, A. fib with RVR, UTI , AKI and elevated troponins.   Subjective:    Brandi Heath today Lethargic, and unable to provide any complaints.    Assessment  & Plan :    Active Problems:   Dementia with behavioral disturbance   Hypertensive heart disease with CHF (congestive heart failure) (HCC)   Atrial fibrillation with rapid ventricular response (HCC)   Acute encephalopathy   UTI (urinary tract infection)   SBO (small bowel obstruction)   Atrial fibrillation with RVR (HCC)   Acute renal failure (ARF) (HCC)   Dehydration   Acute encephalopathy - Metabolic, most likely in the setting of dehydration, UTI, and acute renal failure.  SBO - Patient with known history of recurrent SBO, most recent admission in 10/31/2015, improved with conservative management. - Still with significant NGT suction/output, keep nothing by mouth, January fluids, surgery consulted.  A. fib with RVR - CHADS2vasc score of 8, on Eluiquis. currently on hold given his renal  failure and patient is nothing by mouth. Currently On heparin GTT given recent history of CVA - Soft blood pressure, nothing by mouth, can't give her home dose diltiazem and metoprolol, received IV digoxin with minimal improvement, will start on immediate on drip given soft blood pressure.  Elevated troponins - Most likely in the setting of demand ischemia from atrial fibrillation, soft blood pressure, as well due to worsening renal function, we'll continue to trend. -  CT of the aspirin, on heparin GTT  UTI - Continue with levofloxacin, follow on urine cultures   Acute renal failure - Baseline creatinine 0.8, was 3.4 on admission - due to volume depletion from nausea vomiting, continue with IV fluids, hold Lasix and nephrotoxic medication  Dementia - Resume donepezil when able to tolerate oral  Chronic systolic CHF - Last EF of 40-45% on 10/06/2015. Patient on a beta blocker. No ACEi or ARB patient currently in acute renal failure will hold Lasix due to dehydration. Monitor closely as on IV fluids  Code Status : DNR  Family Communication  : None at bedside  Disposition Plan  : pending further work up.  Consults  :  Gen Surgery  Procedures  : None  DVT Prophylaxis  :  On Heparin GTT  Lab Results  Component Value Date   PLT 207 12/01/2015    Antibiotics  :   Anti-infectives    Start     Dose/Rate Route Frequency Ordered Stop   12/02/15 2200  levofloxacin (LEVAQUIN) IVPB 500 mg     500 mg 100 mL/hr over 60 Minutes Intravenous Every 48 hours 11/30/15 2202     11/30/15 2230  aztreonam (AZACTAM) 500 mg in dextrose 5 % 50 mL IVPB  Status:  Discontinued     500 mg 100 mL/hr over 30 Minutes Intravenous Every 8 hours 11/30/15 2203 12/01/15 0732   11/30/15 2145  levofloxacin (LEVAQUIN) IVPB 750 mg     750 mg 100 mL/hr over 90 Minutes Intravenous  Once 11/30/15 2130 11/30/15 2327   11/30/15 2145  aztreonam (AZACTAM) 2 g in dextrose 5 % 50 mL IVPB  Status:  Discontinued     2  g 100 mL/hr over 30 Minutes Intravenous  Once 11/30/15 2130 11/30/15 2203        Objective:   Vitals:   12/01/15 0700 12/01/15 0730 12/01/15 0800 12/01/15 0830  BP: 105/61 (!) 83/52 (!) 97/54 (!) 95/48  Pulse:  68 (!) 59 81  Resp: 14 12 16 17   Temp:   98.3 F (36.8 C)   TempSrc:   Oral   SpO2: 100% 100% 93% 97%  Weight:      Height:        Wt Readings from Last 3 Encounters:  12/01/15 54.2 kg (119 lb 7.8 oz)  11/14/15 66.2 kg (146 lb)  11/14/15 72.6 kg (160 lb)     Intake/Output Summary (Last 24 hours) at 12/01/15 1101 Last data filed at 12/01/15 0800  Gross per 24 hour  Intake          3064.27 ml  Output             4500 ml  Net         -1435.73 ml     Physical Exam  Lethargic, opens eyes to verbal stimuli. Supple Neck,No JVD, Symmetrical Chest wall movement, Good air movement bilaterally, CTAB Irregular irregular, tachycardic,No Gallops,Rubs , + murmur, No Parasternal Heave +ve B.Sounds, Abd Soft, No tenderness, No Cyanosis, Clubbing or edema, No new Rash or bruise      Data Review:    CBC  Recent Labs Lab 11/30/15 1838 12/01/15 0316  WBC 10.4 9.8  HGB 14.3 11.7*  HCT 41.5 35.0*  PLT 279 207  MCV 83.5 83.7  MCH 28.8 28.0  MCHC 34.5 33.4  RDW 13.6 13.5  LYMPHSABS 1.2  --   MONOABS 0.8  --   EOSABS 0.0  --   BASOSABS 0.0  --  Chemistries   Recent Labs Lab 11/30/15 1838 12/01/15 0316  NA 143 146*  K 4.2 3.7  CL 92* 100*  CO2 34* 33*  GLUCOSE 176* 96  BUN 60* 56*  CREATININE 3.40* 2.90*  CALCIUM 10.6* 9.0  MG  --  1.9  AST 24 22  ALT 16 13*  ALKPHOS 86 62  BILITOT 0.8 0.7   ------------------------------------------------------------------------------------------------------------------ No results for input(s): CHOL, HDL, LDLCALC, TRIG, CHOLHDL, LDLDIRECT in the last 72 hours.  Lab Results  Component Value Date   HGBA1C 5.8 (H) 10/04/2015    ------------------------------------------------------------------------------------------------------------------  Recent Labs  12/01/15 0316  TSH 1.243   ------------------------------------------------------------------------------------------------------------------ No results for input(s): VITAMINB12, FOLATE, FERRITIN, TIBC, IRON, RETICCTPCT in the last 72 hours.  Coagulation profile  Recent Labs Lab 11/30/15 1838  INR 1.47    No results for input(s): DDIMER in the last 72 hours.  Cardiac Enzymes  Recent Labs Lab 11/30/15 2212 12/01/15 0316 12/01/15 0755  TROPONINI 0.05* 0.10* 0.12*   ------------------------------------------------------------------------------------------------------------------ No results found for: BNP  Inpatient Medications  Scheduled Meds: . amiodarone  150 mg Intravenous Once  . chlorhexidine  15 mL Mouth Rinse BID  . levETIRAcetam  750 mg Intravenous Q12H  . [START ON 12/02/2015] levofloxacin (LEVAQUIN) IV  500 mg Intravenous Q48H  . mouth rinse  15 mL Mouth Rinse q12n4p  . sodium chloride flush  3 mL Intravenous Q12H   Continuous Infusions: . sodium chloride 100 mL/hr at 12/01/15 0846  . amiodarone     Followed by  . amiodarone    . heparin     PRN Meds:.acetaminophen **OR** acetaminophen, ondansetron **OR** ondansetron (ZOFRAN) IV  Micro Results Recent Results (from the past 240 hour(s))  MRSA PCR Screening     Status: None   Collection Time: 11/30/15 11:46 PM  Result Value Ref Range Status   MRSA by PCR NEGATIVE NEGATIVE Final    Comment:        The GeneXpert MRSA Assay (FDA approved for NASAL specimens only), is one component of a comprehensive MRSA colonization surveillance program. It is not intended to diagnose MRSA infection nor to guide or monitor treatment for MRSA infections.     Radiology Reports Ct Abdomen Pelvis Wo Contrast  Result Date: 11/30/2015 CLINICAL DATA:  Vomiting. Generalized abdominal  pain and distention. EXAM: CT ABDOMEN AND PELVIS WITHOUT CONTRAST TECHNIQUE: Multidetector CT imaging of the abdomen and pelvis was performed following the standard protocol without IV contrast. COMPARISON:  10/01/2015 FINDINGS: Lower chest: There is cardiomegaly. Chronic opacities, likely scarring in the lung bases. No effusions. Densely calcified coronary arteries noted. There is a moderate-sized hiatal hernia which is fluid-filled. Hepatobiliary: Prior cholecystectomy.  No focal hepatic abnormality. Pancreas: Diffuse atrophy of the pancreas without visualized focal abnormality or ductal dilatation. Spleen: No focal abnormality.  Normal size. Adrenals/Urinary Tract: Small cyst in the midpole of the right kidney. No hydronephrosis. Adrenal gland is unremarkable. Urinary bladder decompressed. Stomach/Bowel: The stomach is markedly distended with fluid. There are dilated small bowel loops in the right abdomen and extending into the pelvis. Transition point is in the mid pelvis on image 64, presumably related to adhesions. Findings compatible with distal small bowel obstruction. Colon is decompressed, grossly unremarkable. Vascular/Lymphatic: Prior stent graft repair of abdominal aortic aneurysm. The aneurysm sac size is 5.1 cm compared with 5.8 cm previously. No adenopathy. Reproductive: Prior hysterectomy.  No adnexal masses. Other: No free fluid or free air. Musculoskeletal: No acute bony abnormality or focal bone lesion. Diffuse degenerative disc  and facet disease in the lumbar spine. IMPRESSION: Distal small bowel obstruction, likely related to adhesions. Moderate-sized fluid-filled hiatal hernia. Coronary artery disease, aortic atherosclerosis. Prior stent graft repair of AAA. Decreasing aneurysm sac size since prior study. Electronically Signed   By: Charlett Nose M.D.   On: 11/30/2015 20:25   Dg Chest 2 View  Result Date: 11/14/2015 CLINICAL DATA:  Altered mental status. EXAM: CHEST  2 VIEW COMPARISON:   Chest radiograph 10/28/2015 FINDINGS: Cardiomediastinal contours are unchanged. Diffusely increased pulmonary markings remain. There is no focal airspace consolidation. No pneumothorax or sizable pleural effusion. IMPRESSION: 1. No radiographic evidence of pneumonia. 2. Unchanged findings of chronic lung disease with diffusely increased pulmonary markings and bibasilar no nodular opacities. Electronically Signed   By: Deatra Robinson M.D.   On: 11/14/2015 14:16   Dg Abd 1 View  Result Date: 12/01/2015 CLINICAL DATA:  Small bowel obstruction EXAM: ABDOMEN - 1 VIEW COMPARISON:  11/30/2015 FINDINGS: Dilated small bowel loops again noted right abdomen and pelvis compatible with small bowel obstruction, unchanged. NG tube remains in the stomach. Stent graft again noted. IVC filter noted. No free air or visible organomegaly. IMPRESSION: Continued small bowel dilatation compatible with small bowel obstruction. No real change. Electronically Signed   By: Charlett Nose M.D.   On: 12/01/2015 07:09   Ct Head Wo Contrast  Result Date: 11/14/2015 CLINICAL DATA:  Altered mental status/ lethargy.  Left arm weakness EXAM: CT HEAD WITHOUT CONTRAST TECHNIQUE: Contiguous axial images were obtained from the base of the skull through the vertex without intravenous contrast. COMPARISON:  Brain MRI October 03, 2015 FINDINGS: Brain: There is moderate diffuse atrophy, unchanged. Note that there is fairly severe cerebellar atrophy, likewise stable. There is no intracranial mass, hemorrhage, extra-axial fluid collection, or midline shift. There is patchy small vessel disease in the centra semiovale bilaterally. There is no new gray-white compartment lesion. No acute infarct evident. Vascular: There is no demonstrable hyperdense vessel. There is calcification in the carotid siphon regions bilaterally. Skull: The bony calvarium appears intact. Sinuses/Orbits: Visualized paranasal sinuses are clear. Orbits appear symmetric bilaterally.  Other: Mastoid air cells are clear. IMPRESSION: Diffuse atrophy, stable. Patchy periventricular small vessel disease, stable. No acute infarct is demonstrable on this study. No hemorrhage, mass, or extra-axial fluid collection. Carotid siphon region vascular calcification evident bilaterally. Electronically Signed   By: Bretta Bang III M.D.   On: 11/14/2015 13:54   Mr Brain Wo Contrast  Result Date: 11/14/2015 CLINICAL DATA:  Altered mental status and lethargy EXAM: MRI HEAD WITHOUT CONTRAST TECHNIQUE: Multiplanar, multiecho pulse sequences of the brain and surrounding structures were obtained without intravenous contrast. COMPARISON:  Head CT 11/14/2015, brain MRI 10/03/2015 FINDINGS: Brain: No acute infarct or intraparenchymal hemorrhage. The midline structures are normal. There is beginning confluent hyperintense T2-weighted signal within the periventricular white matter, most often seen in the setting of chronic microvascular ischemia. There is diffuse atrophy with mild parietal predominance. No mass lesion or midline shift. No hydrocephalus or extra-axial fluid collection. Vascular: Major intracranial arterial and venous sinus flow voids are preserved. Chronic hemosiderin deposition within the right caudate body. Skull and upper cervical spine: The visualized skull base, calvarium, upper cervical spine and extracranial soft tissues are normal. Sinuses/Orbits: No fluid levels or advanced mucosal thickening. No mastoid effusion. Normal orbits. IMPRESSION: 1. No acute intracranial abnormality. 2. Findings of chronic microvascular disease and diffuse atrophy, with mild parietal predominance. Electronically Signed   By: Deatra Robinson M.D.   On: 11/14/2015  19:53   Dg Abd Portable 1v  Result Date: 11/30/2015 CLINICAL DATA:  Nasogastric tube placement EXAM: PORTABLE ABDOMEN - 1 VIEW COMPARISON:  11/30/2015 CT FINDINGS: The nasogastric tube extends well into the stomach with tip in the region of the  distal gastric body. IMPRESSION: Nasogastric tube extends into the stomach. Electronically Signed   By: Ellery Plunk M.D.   On: 11/30/2015 22:55    Time Spent in minutes  35 minutes   Janney Priego M.D on 12/01/2015 at 11:01 AM  Between 7am to 7pm - Pager - [phone]  After 7pm go to [email or web address] - password [email or web address]  [facility] -  Office  [phone]

## 2015-12-01 NOTE — Progress Notes (Signed)
RN notified on-call [facility] NP of short bursts of apnea accompanied by decrease in oxygen saturation. Patient quickly recovers from the apnea; O2 saturation 90-96% on room air; No further orders; will continue to monitor.

## 2015-12-01 NOTE — Progress Notes (Signed)
Patient with HR from the 120-140's upon shift assessment with BP 83/53. Per night shift RN BP dropped with cardizem admin overnight and patient was not responsive to IV diltiazem. Dr. Randol Kern made aware and placed order for 250cc fluid bolus.

## 2015-12-01 NOTE — Progress Notes (Signed)
This encounter was created in error - please disregard.

## 2015-12-01 NOTE — Progress Notes (Signed)
ANTICOAGULATION CONSULT NOTE - Follow Up  Pharmacy Consult for Heparin Indication: atrial fibrillation  Allergies  Allergen Reactions  . Chocolate Diarrhea  . Lactose Intolerance (Gi) Diarrhea  . Penicillins Other (See Comments)    Reaction:  Unknown  Has patient had a PCN reaction causing immediate rash, facial/tongue/throat swelling, SOB or lightheadedness with hypotension: Unsure Has patient had a PCN reaction causing severe rash involving mucus membranes or skin necrosis: Unsure Has patient had a PCN reaction that required hospitalization:  Unsure  Has patient had a PCN reaction occurring within the last 10 years: Unsure If all of the above answers are "NO", then may proceed with Cephalosporin use.    Patient Measurements: Height: 5\' 9"  (175.3 cm) Weight: 119 lb 7.8 oz (54.2 kg) IBW/kg (Calculated) : 66.2 Heparin Dosing Weight: actual body weight  Vital Signs: Temp: 98.3 F (36.8 C) (11/18 0800) Temp Source: Oral (11/18 0800) BP: 95/48 (11/18 0830) Pulse Rate: 81 (11/18 0830)  Labs:  Recent Labs  11/30/15 1838 11/30/15 2212 12/01/15 0009 12/01/15 0316 12/01/15 0755  HGB 14.3  --   --  11.7*  --   HCT 41.5  --   --  35.0*  --   PLT 279  --   --  207  --   APTT  --   --  32  --  129*  LABPROT 17.9*  --   --   --   --   INR 1.47  --   --   --   --   HEPARINUNFRC  --   --  >2.20*  --  >2.20*  CREATININE 3.40*  --   --  2.90*  --   TROPONINI  --  0.05*  --  0.10* 0.12*    Estimated Creatinine Clearance: 11.9 mL/min (by C-G formula based on SCr of 2.9 mg/dL (H)).   Medical History: Past Medical History:  Diagnosis Date  . CHF (congestive heart failure) (HCC)   . Clotting disorder (HCC)   . Coronary artery disease   . Dementia without behavioral disturbance   . DVT (deep venous thrombosis) (HCC)   . Hyperlipidemia   . Hypertension   . OSA (obstructive sleep apnea)   . PE (pulmonary embolism)   . Persistent atrial fibrillation (HCC)   . Polyneuropathy  (HCC)   . SBO (small bowel obstruction)   . Seizures (HCC)   . Stroke [facility])     Assessment: 82 yr female known to pharmacy from earlier consult for levaquin and aztreonam dosing for UTI/sepsis. PMH significant for AFib and patient is on oral anticoagulation PTA with Eliquis 2.5mg  BID.  Last dose taken confirmed by nursing home was 11/30/15 @ 9am. Patient with small bowel obstruction and orders for nothing by mouth. Pharmacy consulted to dose IV heparin for AFib, while Eliquis is on hold  Today, 12/01/15  APTT supratherapeutic on heparin rate of 950 units/hr  Heparin level supratherapeutic as expected - likely related to Eliquis  Per RN, no bleeding or issues noted  CBC OK  Goal of Therapy:  Heparin level 0.3-0.7 units/ml  APTT 66-102 sec Monitor platelets by anticoagulation protocol: Yes   Plan:   Reduce IV heparin drip from 950 units/hr to 850 units/hr  Will continue to check aPTT and heparin levels until no further effect from PTA Eliquis apparent   Recheck aPTT and heparin level in 8 hours after rate change this AM  Follow heparin level & CBC daily   Hessie Knows, PharmD, BCPS Pager  [phone] 12/01/2015 10:48 AM

## 2015-12-02 ENCOUNTER — Inpatient Hospital Stay (HOSPITAL_COMMUNITY): Payer: Medicare Other

## 2015-12-02 LAB — CBC
HEMATOCRIT: 36 % (ref 36.0–46.0)
HEMOGLOBIN: 11.7 g/dL — AB (ref 12.0–15.0)
MCH: 27.9 pg (ref 26.0–34.0)
MCHC: 32.5 g/dL (ref 30.0–36.0)
MCV: 85.7 fL (ref 78.0–100.0)
Platelets: 220 10*3/uL (ref 150–400)
RBC: 4.2 MIL/uL (ref 3.87–5.11)
RDW: 14 % (ref 11.5–15.5)
WBC: 8.8 10*3/uL (ref 4.0–10.5)

## 2015-12-02 LAB — BASIC METABOLIC PANEL
Anion gap: 12 (ref 5–15)
BUN: 45 mg/dL — ABNORMAL HIGH (ref 6–20)
CHLORIDE: 103 mmol/L (ref 101–111)
CO2: 34 mmol/L — AB (ref 22–32)
CREATININE: 2 mg/dL — AB (ref 0.44–1.00)
Calcium: 9.3 mg/dL (ref 8.9–10.3)
GFR calc non Af Amer: 21 mL/min — ABNORMAL LOW (ref >60)
GFR, EST AFRICAN AMERICAN: 25 mL/min — AB (ref >60)
Glucose, Bld: 101 mg/dL — ABNORMAL HIGH (ref 65–99)
POTASSIUM: 2.6 mmol/L — AB (ref 3.5–5.1)
Sodium: 149 mmol/L — ABNORMAL HIGH (ref 135–145)

## 2015-12-02 LAB — HEPARIN LEVEL (UNFRACTIONATED)
HEPARIN UNFRACTIONATED: 1.07 [IU]/mL — AB (ref 0.30–0.70)
HEPARIN UNFRACTIONATED: 0.91 [IU]/mL — AB (ref 0.30–0.70)

## 2015-12-02 LAB — TROPONIN I: TROPONIN I: 0.07 ng/mL — AB (ref <0.03)

## 2015-12-02 LAB — APTT
APTT: 86 s — AB (ref 24–36)
APTT: 95 s — AB (ref 24–36)

## 2015-12-02 LAB — MAGNESIUM: MAGNESIUM: 1.9 mg/dL (ref 1.7–2.4)

## 2015-12-02 MED ORDER — DEXTROSE-NACL 5-0.45 % IV SOLN
INTRAVENOUS | Status: DC
  Administered 2015-12-02: 09:00:00 via INTRAVENOUS

## 2015-12-02 MED ORDER — POTASSIUM CHLORIDE 2 MEQ/ML IV SOLN
Freq: Once | INTRAVENOUS | Status: AC
  Administered 2015-12-02: 09:00:00 via INTRAVENOUS
  Filled 2015-12-02: qty 1000

## 2015-12-02 MED ORDER — SODIUM CHLORIDE 0.9% FLUSH
10.0000 mL | INTRAVENOUS | Status: DC | PRN
Start: 2015-12-02 — End: 2015-12-25
  Administered 2015-12-08: 10 mL
  Administered 2015-12-09: 30 mL
  Administered 2015-12-09: 10 mL
  Administered 2015-12-10: 30 mL
  Administered 2015-12-11: 20 mL
  Administered 2015-12-13 – 2015-12-23 (×6): 10 mL
  Administered 2015-12-24: 20 mL
  Administered 2015-12-25: 10 mL
  Filled 2015-12-02 (×13): qty 40

## 2015-12-02 MED ORDER — POTASSIUM CHLORIDE IN NACL 40-0.9 MEQ/L-% IV SOLN: INTRAVENOUS | Status: DC

## 2015-12-02 MED ORDER — KCL IN DEXTROSE-NACL 20-5-0.45 MEQ/L-%-% IV SOLN
INTRAVENOUS | Status: DC
  Administered 2015-12-02: 1000 mL via INTRAVENOUS
  Administered 2015-12-02 – 2015-12-03 (×2): via INTRAVENOUS
  Filled 2015-12-02 (×3): qty 1000

## 2015-12-02 MED ORDER — MAGNESIUM SULFATE 2 GM/50ML IV SOLN
2.0000 g | Freq: Once | INTRAVENOUS | Status: AC
  Administered 2015-12-02: 2 g via INTRAVENOUS
  Filled 2015-12-02: qty 50

## 2015-12-02 MED ORDER — POTASSIUM CHLORIDE 2 MEQ/ML IV SOLN: Freq: Once | INTRAVENOUS | Status: DC

## 2015-12-02 MED ORDER — SODIUM CHLORIDE 0.9% FLUSH
10.0000 mL | Freq: Two times a day (BID) | INTRAVENOUS | Status: DC
  Administered 2015-12-02 – 2015-12-21 (×23): 10 mL

## 2015-12-02 NOTE — Progress Notes (Signed)
Initial Nutrition Assessment  DOCUMENTATION CODES:   Severe malnutrition in context of acute illness/injury, Underweight  INTERVENTION:   Diet advancement per surgery Will provide nutritional supplements upon diet advancement RD to continue to monitor for plan  NUTRITION DIAGNOSIS:   Inadequate oral intake related to lethargy/confusion as evidenced by NPO status.  GOAL:   Patient will meet greater than or equal to 90% of their needs  MONITOR:   Diet advancement, Labs, Weight trends, Skin, I & O's  REASON FOR ASSESSMENT:   Malnutrition Screening Tool    ASSESSMENT:   82 y.o. female with medical history significant of  CAD, dementia, DVT, OSA, D, atrial fibrillation on anticoagulation, recurrent small bowel obstruction, CHF nonischemic cardiomyopathy with mitral regurgitation, seizure disorder, recent stroke, As with abdominal pain, vomiting and altered mental status.  Patient in room with no family at bedside. Pt unable to provide any nutritional history d/t dementia. Per chart review, pt admitted with difficulty swallowing and SBO. Currently NPO for bowel rest. NGT in place, output: 900 ml. Reported 10 lb weight loss over the past week.  Per chart review, pt has lost 27 lb since 11/1 (18% wt loss x 18 days, significant for time frame). Nutrition-Focused physical exam completed. Findings are moderate fat depletion, severe muscle depletion.  Per previous medication history, pt was receiving Ensure supplements. Will verify prior to order when/if diet is advanced. Will monitor for plan.  Medications: D5 and .45% NaCl w/ KCl infusion at 125 ml/hr -provides 510 kcal Labs reviewed: Elevated Na Low K Mg/Phos WNL GFR: 25  Diet Order:  Diet NPO time specified  Skin:  Wound (see comment) (unstageable sacral pressure injury)  Last BM:  11/17  Height:   Ht Readings from Last 1 Encounters:  12/01/15 5\' 9"  (1.753 m)    Weight:   Wt Readings from Last 1 Encounters:   12/01/15 119 lb 7.8 oz (54.2 kg)    Ideal Body Weight:  65.9 kg  BMI:  Body mass index is 17.65 kg/m.  Estimated Nutritional Needs:   Kcal:  1500-1700  Protein:  70-80g  Fluid:  1.5-1.7L/day  EDUCATION NEEDS:   No education needs identified at this time  Tilda Franco, MS, RD, LDN Pager: [phone] After Hours Pager: [phone]

## 2015-12-02 NOTE — Progress Notes (Signed)
ANTICOAGULATION CONSULT NOTE - Follow Up Consult  Pharmacy Consult for Heparin Indication: atrial fibrillation  Patient Measurements: Height: 5\' 9"  (175.3 cm) Weight: 119 lb 7.8 oz (54.2 kg) IBW/kg (Calculated) : 66.2 Heparin Dosing Weight: actual weight  Medications:  Infusions:  . amiodarone 30 mg/hr (12/02/15 1012)  . dextrose 5 % and 0.45 % NaCl with KCl 20 mEq/L    . heparin 700 Units/hr (12/02/15 0721)    Assessment: 28 yoF admitted with UTI/sepsis. PMH significant for AFib and patient is on oral anticoagulation PTA with Eliquis, held for NPO status.  Pharmacy consulted to dose IV heparin for AFib, while Eliquis is on hold.  Today, 12/02/15  HL still supratherapeutic as expected d/t Eliquis but starting to decrease as Eliquis is eliminated from body.   APTT continues to be therapeutic at 95 on current heparin drip rate of 700 units/hr  Goal of Therapy:  Heparin level 0.3-0.7 units/ml aPTT 66-102 seconds Monitor platelets by anticoagulation protocol: Yes   Plan:   Continue current IV heparin rate at 700 units/hr  Recheck APTT and heparin level in AM  Daily aPTT/heparin level and CBC   Hessie Knows, PharmD, BCPS Pager [phone] 12/02/2015 3:24 PM

## 2015-12-02 NOTE — Progress Notes (Signed)
Peripherally Inserted Central Catheter/Midline Placement  The IV Nurse has discussed with the patient and/or persons authorized to consent for the patient, the purpose of this procedure and the potential benefits and risks involved with this procedure.  The benefits include less needle sticks, lab draws from the catheter, and the patient may be discharged home with the catheter. Risks include, but not limited to, infection, bleeding, blood clot (thrombus formation), and puncture of an artery; nerve damage and irregular heartbeat and possibility to perform a PICC exchange if needed/ordered by physician.  Alternatives to this procedure were also discussed.  Bard Power PICC patient education guide, fact sheet on infection prevention and patient information card has been provided to patient /or left at bedside.  Spoke to dtr on phone , obtained consent.  PICC/Midline Placement Documentation  PICC Triple Lumen 12/02/15 PICC Right Basilic 38 cm 1 cm (Active)  Indication for Insertion or Continuance of Line Vasoactive infusions;Administration of hyperosmolar/irritating solutions (i.e. TPN, Vancomycin, etc.) 12/02/2015  6:13 PM  Exposed Catheter (cm) 1 cm 12/02/2015  6:13 PM  Site Assessment Clean;Dry;Intact 12/02/2015  6:13 PM  Lumen #1 Status Flushed;Saline locked;Blood return noted 12/02/2015  6:13 PM  Lumen #2 Status Flushed;Saline locked;Blood return noted 12/02/2015  6:13 PM  Lumen #3 Status Flushed;Saline locked;Blood return noted 12/02/2015  6:13 PM  Dressing Type Transparent 12/02/2015  6:13 PM  Dressing Status Clean;Intact;Dry;Antimicrobial disc in place 12/02/2015  6:13 PM  Line Care Connections checked and tightened 12/02/2015  6:13 PM  Line Adjustment (NICU/IV Team Only) No 12/02/2015  6:13 PM  Dressing Intervention New dressing 12/02/2015  6:13 PM  Dressing Change Due 12/09/15 12/02/2015  6:13 PM       Elliot Dally 12/02/2015, 6:15 PM

## 2015-12-02 NOTE — Progress Notes (Signed)
PHARMACY - ADULT TOTAL PARENTERAL NUTRITION CONSULT NOTE   Pharmacy Consult for TPN Indication: SBO   Assessment: Patient needs PICC line inserted prior to starting TPN. Patient will also need updated labs. Will order labs for AM and plan to start TPN tomorrow 11/20.   Hessie Knows, PharmD, BCPS Pager [phone] 12/02/2015 4:07 PM

## 2015-12-02 NOTE — Progress Notes (Signed)
Subjective: Failed SB protocol yesterday.  Continues to have poor mental status.  No stools.    Objective: Vital signs in last 24 hours: Temp:  [98.1 F (36.7 C)-98.7 F (37.1 C)] 98.7 F (37.1 C) (11/19 0600) Pulse Rate:  [43-144] 43 (11/19 0700) Resp:  [10-29] 19 (11/19 0700) BP: (92-154)/(44-95) 109/58 (11/19 0700) SpO2:  [91 %-98 %] 92 % (11/19 0700) Last BM Date: 11/30/15  Intake/Output from previous day: 11/18 0701 - 11/19 0700 In: 3480.9 [I.V.:2911.9; NG/GT:140; IV Piggyback:429] Out: 4250 [Emesis/NG output:4250] Intake/Output this shift: No intake/output data recorded.  General appearance: not interactive.  moves extremities when stimulated.  Does not open eyes to stimulation Resp: breathing comfortably GI: soft, non distended, does not appear to be tender.    Lab Results:   Recent Labs  12/01/15 0316 12/02/15 0621  WBC 9.8 8.8  HGB 11.7* 11.7*  HCT 35.0* 36.0  PLT 207 220   BMET  Recent Labs  12/01/15 0316 12/02/15 0621  NA 146* 149*  K 3.7 2.6*  CL 100* 103  CO2 33* 34*  GLUCOSE 96 101*  BUN 56* 45*  CREATININE 2.90* 2.00*  CALCIUM 9.0 9.3   PT/INR  Recent Labs  11/30/15 1838  LABPROT 17.9*  INR 1.47   ABG No results for input(s): PHART, HCO3 in the last 72 hours.  Invalid input(s): PCO2, PO2  Studies/Results: Ct Abdomen Pelvis Wo Contrast  Result Date: 11/30/2015 CLINICAL DATA:  Vomiting. Generalized abdominal pain and distention. EXAM: CT ABDOMEN AND PELVIS WITHOUT CONTRAST TECHNIQUE: Multidetector CT imaging of the abdomen and pelvis was performed following the standard protocol without IV contrast. COMPARISON:  10/01/2015 FINDINGS: Lower chest: There is cardiomegaly. Chronic opacities, likely scarring in the lung bases. No effusions. Densely calcified coronary arteries noted. There is a moderate-sized hiatal hernia which is fluid-filled. Hepatobiliary: Prior cholecystectomy.  No focal hepatic abnormality. Pancreas: Diffuse atrophy  of the pancreas without visualized focal abnormality or ductal dilatation. Spleen: No focal abnormality.  Normal size. Adrenals/Urinary Tract: Small cyst in the midpole of the right kidney. No hydronephrosis. Adrenal gland is unremarkable. Urinary bladder decompressed. Stomach/Bowel: The stomach is markedly distended with fluid. There are dilated small bowel loops in the right abdomen and extending into the pelvis. Transition point is in the mid pelvis on image 64, presumably related to adhesions. Findings compatible with distal small bowel obstruction. Colon is decompressed, grossly unremarkable. Vascular/Lymphatic: Prior stent graft repair of abdominal aortic aneurysm. The aneurysm sac size is 5.1 cm compared with 5.8 cm previously. No adenopathy. Reproductive: Prior hysterectomy.  No adnexal masses. Other: No free fluid or free air. Musculoskeletal: No acute bony abnormality or focal bone lesion. Diffuse degenerative disc and facet disease in the lumbar spine. IMPRESSION: Distal small bowel obstruction, likely related to adhesions. Moderate-sized fluid-filled hiatal hernia. Coronary artery disease, aortic atherosclerosis. Prior stent graft repair of AAA. Decreasing aneurysm sac size since prior study. Electronically Signed   By: Charlett NoseKevin  Dover M.D.   On: 11/30/2015 20:25   Dg Abd 1 View  Result Date: 12/01/2015 CLINICAL DATA:  Small bowel obstruction EXAM: ABDOMEN - 1 VIEW COMPARISON:  11/30/2015 FINDINGS: Dilated small bowel loops again noted right abdomen and pelvis compatible with small bowel obstruction, unchanged. NG tube remains in the stomach. Stent graft again noted. IVC filter noted. No free air or visible organomegaly. IMPRESSION: Continued small bowel dilatation compatible with small bowel obstruction. No real change. Electronically Signed   By: Charlett NoseKevin  Dover M.D.   On: 12/01/2015 07:09  Dg Abd Portable 1v-small Bowel Obstruction Protocol-initial, 8 Hr Delay  Result Date: 12/01/2015 CLINICAL  DATA:  Small bowel obstruction. EXAM: PORTABLE ABDOMEN - 1 VIEW COMPARISON:  December 01, 2015 FINDINGS: The NG tube is stable. A small amount of contrast is seen in the stomach, new in the interval. No other contrast seen throughout the abdomen. Persistently dilated small bowel loops in the right abdomen consistent with small bowel obstruction. No other change. IMPRESSION: Small bowel obstruction. Electronically Signed   By: Gerome Sam III M.D   On: 12/01/2015 22:49   Dg Abd Portable 1v-small Bowel Protocol-position Verification  Result Date: 12/01/2015 CLINICAL DATA:  Followup small bowel obstruction. Nasogastric tube placement. EXAM: PORTABLE ABDOMEN - 1 VIEW COMPARISON:  12/01/2015 FINDINGS: Nasogastric tube is seen with tip in the body the stomach. Moderately dilated small bowel loops in the right abdomen show no significant change compared to recent study. Aortoiliac stent graft again seen as well as embolization coils adjacent to the right iliac limb of the graft. IVC filter again noted. IMPRESSION: Nasogastric tube tip remains in the body the stomach. No significant change in dilated small bowel loops, consistent with small bowel obstruction. Electronically Signed   By: Myles Rosenthal M.D.   On: 12/01/2015 12:49   Dg Abd Portable 1v  Result Date: 11/30/2015 CLINICAL DATA:  Nasogastric tube placement EXAM: PORTABLE ABDOMEN - 1 VIEW COMPARISON:  11/30/2015 CT FINDINGS: The nasogastric tube extends well into the stomach with tip in the region of the distal gastric body. IMPRESSION: Nasogastric tube extends into the stomach. Electronically Signed   By: Ellery Plunk M.D.   On: 11/30/2015 22:55    Anti-infectives: Anti-infectives    Start     Dose/Rate Route Frequency Ordered Stop   12/02/15 2200  levofloxacin (LEVAQUIN) IVPB 500 mg     500 mg 100 mL/hr over 60 Minutes Intravenous Every 48 hours 11/30/15 2202     11/30/15 2230  aztreonam (AZACTAM) 500 mg in dextrose 5 % 50 mL IVPB   Status:  Discontinued     500 mg 100 mL/hr over 30 Minutes Intravenous Every 8 hours 11/30/15 2203 12/01/15 0732   11/30/15 2145  levofloxacin (LEVAQUIN) IVPB 750 mg     750 mg 100 mL/hr over 90 Minutes Intravenous  Once 11/30/15 2130 11/30/15 2327   11/30/15 2145  aztreonam (AZACTAM) 2 g in dextrose 5 % 50 mL IVPB  Status:  Discontinued     2 g 100 mL/hr over 30 Minutes Intravenous  Once 11/30/15 2130 11/30/15 2203      Assessment/Plan: s/p * No surgery found * Pancreatitis SBO Dementia Atrial fibrillation with RVR  Recheck lipase tomorrow Continue NGT/NPO Consider TNA Pt very poor operative candidate based on dementia, recent CVA/SZ, a fib with RVA, and other medical comorbidities. Consider palliative care consult/goals of care.    Would attempt non operative management as long as patient stable.  No signs of intestinal compromise.   LOS: 2 days    [facility] 12/02/2015

## 2015-12-02 NOTE — Progress Notes (Signed)
ANTICOAGULATION CONSULT NOTE - Follow Up Consult  Pharmacy Consult for Heparin Indication: atrial fibrillation  Patient Measurements: Height: 5\' 9"  (175.3 cm) Weight: 119 lb 7.8 oz (54.2 kg) IBW/kg (Calculated) : 66.2 Heparin Dosing Weight: actual weight  Medications:  Infusions:  . 0.9 % NaCl with KCl 40 mEq / L    . amiodarone 30 mg/hr (12/02/15 0024)  . heparin 700 Units/hr (12/02/15 0721)    Assessment: 27 yoF admitted with UTI/sepsis. PMH significant for AFib and patient is on oral anticoagulation PTA with Eliquis, held for NPO status.  Pharmacy consulted to dose IV heparin for AFib, while Eliquis is on hold.  Today, 12/02/15  HL still supratherapeutic as expected d/t Eliquis but starting to decrease  APTT now therapeutic at 86 after heparin drip was decreased last night to 700 units/hr  Goal of Therapy:  Heparin level 0.3-0.7 units/ml aPTT 66-102 seconds Monitor platelets by anticoagulation protocol: Yes   Plan:   Continue current IV heparin rate at 700 units/hr  Recheck APTT and heparin level in 8 hours to confirm continued therapeutic level at current rate  Daily heparin level and CBC   Hessie Knows, PharmD, BCPS Pager [phone] 12/02/2015 8:10 AM

## 2015-12-02 NOTE — Progress Notes (Signed)
PROGRESS NOTE                                                                                                                                                                                                             Patient Demographics:    Brandi Heath, is a 82 y.o. female, DOB - Nov 20, 1927, ZOX:[id number]  Admit date - 11/30/2015   Admitting Physician Therisa Doyne, MD  Outpatient Primary MD for the patient is Merrilee Seashore, MD  LOS - 2  Chief Complaint  Patient presents with  . Emesis       Brief Narrative   82 y.o. female with medical history significant of  CAD, dementia, DVT, OSA, D, atrial fibrillation on anticoagulation, recurrent small bowel obstruction, CHF nonischemic cardiomyopathy with mitral regurgitation, seizure disorder, recent stroke, As with abdominal pain, vomiting and altered mental status. Workup significant for small bowel obstruction due to adhesion per CT report, A. fib with RVR, UTI , AKI and elevated troponins.   Subjective:    Brandi Heath today more awake, follows simple commands, denies any abdominal pain, chest pain,.     Assessment  & Plan :    Active Problems:   Dementia with behavioral disturbance   Hypertensive heart disease with CHF (congestive heart failure) (HCC)   Atrial fibrillation with rapid ventricular response (HCC)   Acute encephalopathy   UTI (urinary tract infection)   SBO (small bowel obstruction)   Atrial fibrillation with RVR (HCC)   Acute renal failure (ARF) (HCC)   Dehydration   Pressure injury of skin   Acute encephalopathy - Metabolic, most likely in the setting of dehydration, UTI, and acute renal failure.  SBO - Patient with known history of recurrent SBO, most recent admission in 10/31/2015, improved with conservative management. - Still with significant NGT suction/output, keep nothing by mouth, cont IV fluid. - Surgical consult appreciated, very poor operative  candidate, tenuous conservative management, will discuss with family regarding goals of care, would consider PNA in 24 hours after for discussion and no improvement of symptoms..  A. fib with RVR - CHADS2vasc score of 8, on Eluiquis. currently on hold given his renal failure and patient is nothing by mouth. Currently On heparin GTT given recent history of CVA -  nothing by mouth, can't give her home dose diltiazem and metoprolol, received IV digoxin with  minimal improvement, heart rate better Controlled on amiodarone drip .  Elevated troponins - Most likely in the setting of demand ischemia from atrial fibrillation, soft blood pressure, and renal function, troponin trend non-ACS pattern - She received aspirin per rectum on admission, on heparin GTT  UTI - Continue with levofloxacin, follow on urine cultures   Acute renal failure - Baseline creatinine 0.8, was 3.4 on admission - due to volume depletion from nausea, vomiting, and proving with IV fluids, hold Lasix and nephrotoxic medication  Dementia - Resume donepezil when able to tolerate oral  History of seizures - Continue with IV Keppra as an able to take oral.  Chronic systolic CHF - Last EF of 40-45% on 10/06/2015. Patient on a beta blocker. No ACEi or ARB patient currently in acute renal failure will hold Lasix due to dehydration. Monitor closely as on IV fluids  Severe hypokalemia - Secondary to significant NGT output, will replete,  will give Mg as well.  Hypernatremia - Secondary to volume depletion, continue with fluids, will change to D5 half-normal  Code Status : DNR  Family Communication  : None at bedside  Disposition Plan  : remains in step down.  Consults  :  Gen Surgery  Procedures  : None  DVT Prophylaxis  :  On Heparin GTT  Lab Results  Component Value Date   PLT 220 12/02/2015    Antibiotics  :   Anti-infectives    Start     Dose/Rate Route Frequency Ordered Stop   12/02/15 2200  levofloxacin  (LEVAQUIN) IVPB 500 mg     500 mg 100 mL/hr over 60 Minutes Intravenous Every 48 hours 11/30/15 2202     11/30/15 2230  aztreonam (AZACTAM) 500 mg in dextrose 5 % 50 mL IVPB  Status:  Discontinued     500 mg 100 mL/hr over 30 Minutes Intravenous Every 8 hours 11/30/15 2203 12/01/15 0732   11/30/15 2145  levofloxacin (LEVAQUIN) IVPB 750 mg     750 mg 100 mL/hr over 90 Minutes Intravenous  Once 11/30/15 2130 11/30/15 2327   11/30/15 2145  aztreonam (AZACTAM) 2 g in dextrose 5 % 50 mL IVPB  Status:  Discontinued     2 g 100 mL/hr over 30 Minutes Intravenous  Once 11/30/15 2130 11/30/15 2203        Objective:   Vitals:   12/02/15 0500 12/02/15 0600 12/02/15 0700 12/02/15 0800  BP: 104/75 (!) 154/87 (!) 109/58 108/73  Pulse: (!) 113  (!) 43 (!) 107  Resp: 15 18 19  (!) 27  Temp:  98.7 F (37.1 C)    TempSrc:  Oral    SpO2: 97% 97% 92% 95%  Weight:      Height:        Wt Readings from Last 3 Encounters:  12/01/15 54.2 kg (119 lb 7.8 oz)  11/14/15 66.2 kg (146 lb)  11/14/15 72.6 kg (160 lb)     Intake/Output Summary (Last 24 hours) at 12/02/15 1021 Last data filed at 12/02/15 0800  Gross per 24 hour  Intake          3453.26 ml  Output             3250 ml  Net           203.26 ml     Physical Exam  Lethargic, But opens eyes, follow simple commands, answer simple yes or no questions . Supple Neck,No JVD, Symmetrical Chest wall movement, Good air movement bilaterally, CTAB  Irregular irregular, tachycardic,No Gallops,Rubs , + murmur, No Parasternal Heave +ve B.Sounds, Abd Soft, No tenderness, No Cyanosis, Clubbing or edema, No new Rash or bruise      Data Review:    CBC  Recent Labs Lab 11/30/15 1838 12/01/15 0316 12/02/15 0621  WBC 10.4 9.8 8.8  HGB 14.3 11.7* 11.7*  HCT 41.5 35.0* 36.0  PLT 279 207 220  MCV 83.5 83.7 85.7  MCH 28.8 28.0 27.9  MCHC 34.5 33.4 32.5  RDW 13.6 13.5 14.0  LYMPHSABS 1.2  --   --   MONOABS 0.8  --   --   EOSABS 0.0  --   --     BASOSABS 0.0  --   --     Chemistries   Recent Labs Lab 11/30/15 1838 12/01/15 0316 12/02/15 0618 12/02/15 0621  NA 143 146*  --  149*  K 4.2 3.7  --  2.6*  CL 92* 100*  --  103  CO2 34* 33*  --  34*  GLUCOSE 176* 96  --  101*  BUN 60* 56*  --  45*  CREATININE 3.40* 2.90*  --  2.00*  CALCIUM 10.6* 9.0  --  9.3  MG  --  1.9 1.9  --   AST 24 22  --   --   ALT 16 13*  --   --   ALKPHOS 86 62  --   --   BILITOT 0.8 0.7  --   --    ------------------------------------------------------------------------------------------------------------------ No results for input(s): CHOL, HDL, LDLCALC, TRIG, CHOLHDL, LDLDIRECT in the last 72 hours.  Lab Results  Component Value Date   HGBA1C 5.8 (H) 10/04/2015   ------------------------------------------------------------------------------------------------------------------  Recent Labs  12/01/15 0316  TSH 1.243   ------------------------------------------------------------------------------------------------------------------ No results for input(s): VITAMINB12, FOLATE, FERRITIN, TIBC, IRON, RETICCTPCT in the last 72 hours.  Coagulation profile  Recent Labs Lab 11/30/15 1838  INR 1.47    No results for input(s): DDIMER in the last 72 hours.  Cardiac Enzymes  Recent Labs Lab 12/01/15 0316 12/01/15 0755 12/02/15 0621  TROPONINI 0.10* 0.12* 0.07*   ------------------------------------------------------------------------------------------------------------------ No results found for: BNP  Inpatient Medications  Scheduled Meds: . chlorhexidine  15 mL Mouth Rinse BID  . levETIRAcetam  750 mg Intravenous Q12H  . levofloxacin (LEVAQUIN) IV  500 mg Intravenous Q48H  . mouth rinse  15 mL Mouth Rinse q12n4p  . sodium chloride flush  3 mL Intravenous Q12H   Continuous Infusions: . amiodarone 30 mg/hr (12/02/15 1012)  . dextrose 5 % and 0.45 % NaCl with KCl 20 mEq/L    . heparin 700 Units/hr (12/02/15 0721)   PRN  Meds:.acetaminophen **OR** acetaminophen, ondansetron **OR** ondansetron (ZOFRAN) IV  Micro Results Recent Results (from the past 240 hour(s))  MRSA PCR Screening     Status: None   Collection Time: 11/30/15 11:46 PM  Result Value Ref Range Status   MRSA by PCR NEGATIVE NEGATIVE Final    Comment:        The GeneXpert MRSA Assay (FDA approved for NASAL specimens only), is one component of a comprehensive MRSA colonization surveillance program. It is not intended to diagnose MRSA infection nor to guide or monitor treatment for MRSA infections.     Radiology Reports Ct Abdomen Pelvis Wo Contrast  Result Date: 11/30/2015 CLINICAL DATA:  Vomiting. Generalized abdominal pain and distention. EXAM: CT ABDOMEN AND PELVIS WITHOUT CONTRAST TECHNIQUE: Multidetector CT imaging of the abdomen and pelvis was performed following the standard protocol without  IV contrast. COMPARISON:  10/01/2015 FINDINGS: Lower chest: There is cardiomegaly. Chronic opacities, likely scarring in the lung bases. No effusions. Densely calcified coronary arteries noted. There is a moderate-sized hiatal hernia which is fluid-filled. Hepatobiliary: Prior cholecystectomy.  No focal hepatic abnormality. Pancreas: Diffuse atrophy of the pancreas without visualized focal abnormality or ductal dilatation. Spleen: No focal abnormality.  Normal size. Adrenals/Urinary Tract: Small cyst in the midpole of the right kidney. No hydronephrosis. Adrenal gland is unremarkable. Urinary bladder decompressed. Stomach/Bowel: The stomach is markedly distended with fluid. There are dilated small bowel loops in the right abdomen and extending into the pelvis. Transition point is in the mid pelvis on image 64, presumably related to adhesions. Findings compatible with distal small bowel obstruction. Colon is decompressed, grossly unremarkable. Vascular/Lymphatic: Prior stent graft repair of abdominal aortic aneurysm. The aneurysm sac size is 5.1 cm  compared with 5.8 cm previously. No adenopathy. Reproductive: Prior hysterectomy.  No adnexal masses. Other: No free fluid or free air. Musculoskeletal: No acute bony abnormality or focal bone lesion. Diffuse degenerative disc and facet disease in the lumbar spine. IMPRESSION: Distal small bowel obstruction, likely related to adhesions. Moderate-sized fluid-filled hiatal hernia. Coronary artery disease, aortic atherosclerosis. Prior stent graft repair of AAA. Decreasing aneurysm sac size since prior study. Electronically Signed   By: Charlett Nose M.D.   On: 11/30/2015 20:25   Dg Chest 2 View  Result Date: 11/14/2015 CLINICAL DATA:  Altered mental status. EXAM: CHEST  2 VIEW COMPARISON:  Chest radiograph 10/28/2015 FINDINGS: Cardiomediastinal contours are unchanged. Diffusely increased pulmonary markings remain. There is no focal airspace consolidation. No pneumothorax or sizable pleural effusion. IMPRESSION: 1. No radiographic evidence of pneumonia. 2. Unchanged findings of chronic lung disease with diffusely increased pulmonary markings and bibasilar no nodular opacities. Electronically Signed   By: Deatra Robinson M.D.   On: 11/14/2015 14:16   Dg Abd 1 View  Result Date: 12/01/2015 CLINICAL DATA:  Small bowel obstruction EXAM: ABDOMEN - 1 VIEW COMPARISON:  11/30/2015 FINDINGS: Dilated small bowel loops again noted right abdomen and pelvis compatible with small bowel obstruction, unchanged. NG tube remains in the stomach. Stent graft again noted. IVC filter noted. No free air or visible organomegaly. IMPRESSION: Continued small bowel dilatation compatible with small bowel obstruction. No real change. Electronically Signed   By: Charlett Nose M.D.   On: 12/01/2015 07:09   Ct Head Wo Contrast  Result Date: 11/14/2015 CLINICAL DATA:  Altered mental status/ lethargy.  Left arm weakness EXAM: CT HEAD WITHOUT CONTRAST TECHNIQUE: Contiguous axial images were obtained from the base of the skull through the vertex  without intravenous contrast. COMPARISON:  Brain MRI October 03, 2015 FINDINGS: Brain: There is moderate diffuse atrophy, unchanged. Note that there is fairly severe cerebellar atrophy, likewise stable. There is no intracranial mass, hemorrhage, extra-axial fluid collection, or midline shift. There is patchy small vessel disease in the centra semiovale bilaterally. There is no new gray-white compartment lesion. No acute infarct evident. Vascular: There is no demonstrable hyperdense vessel. There is calcification in the carotid siphon regions bilaterally. Skull: The bony calvarium appears intact. Sinuses/Orbits: Visualized paranasal sinuses are clear. Orbits appear symmetric bilaterally. Other: Mastoid air cells are clear. IMPRESSION: Diffuse atrophy, stable. Patchy periventricular small vessel disease, stable. No acute infarct is demonstrable on this study. No hemorrhage, mass, or extra-axial fluid collection. Carotid siphon region vascular calcification evident bilaterally. Electronically Signed   By: Bretta Bang III M.D.   On: 11/14/2015 13:54   Mr Brain  Wo Contrast  Result Date: 11/14/2015 CLINICAL DATA:  Altered mental status and lethargy EXAM: MRI HEAD WITHOUT CONTRAST TECHNIQUE: Multiplanar, multiecho pulse sequences of the brain and surrounding structures were obtained without intravenous contrast. COMPARISON:  Head CT 11/14/2015, brain MRI 10/03/2015 FINDINGS: Brain: No acute infarct or intraparenchymal hemorrhage. The midline structures are normal. There is beginning confluent hyperintense T2-weighted signal within the periventricular white matter, most often seen in the setting of chronic microvascular ischemia. There is diffuse atrophy with mild parietal predominance. No mass lesion or midline shift. No hydrocephalus or extra-axial fluid collection. Vascular: Major intracranial arterial and venous sinus flow voids are preserved. Chronic hemosiderin deposition within the right caudate body.  Skull and upper cervical spine: The visualized skull base, calvarium, upper cervical spine and extracranial soft tissues are normal. Sinuses/Orbits: No fluid levels or advanced mucosal thickening. No mastoid effusion. Normal orbits. IMPRESSION: 1. No acute intracranial abnormality. 2. Findings of chronic microvascular disease and diffuse atrophy, with mild parietal predominance. Electronically Signed   By: Deatra Robinson M.D.   On: 11/14/2015 19:53   Dg Abd Portable 1v-small Bowel Obstruction Protocol-initial, 8 Hr Delay  Result Date: 12/01/2015 CLINICAL DATA:  Small bowel obstruction. EXAM: PORTABLE ABDOMEN - 1 VIEW COMPARISON:  December 01, 2015 FINDINGS: The NG tube is stable. A small amount of contrast is seen in the stomach, new in the interval. No other contrast seen throughout the abdomen. Persistently dilated small bowel loops in the right abdomen consistent with small bowel obstruction. No other change. IMPRESSION: Small bowel obstruction. Electronically Signed   By: Gerome Sam III M.D   On: 12/01/2015 22:49   Dg Abd Portable 1v-small Bowel Protocol-position Verification  Result Date: 12/01/2015 CLINICAL DATA:  Followup small bowel obstruction. Nasogastric tube placement. EXAM: PORTABLE ABDOMEN - 1 VIEW COMPARISON:  12/01/2015 FINDINGS: Nasogastric tube is seen with tip in the body the stomach. Moderately dilated small bowel loops in the right abdomen show no significant change compared to recent study. Aortoiliac stent graft again seen as well as embolization coils adjacent to the right iliac limb of the graft. IVC filter again noted. IMPRESSION: Nasogastric tube tip remains in the body the stomach. No significant change in dilated small bowel loops, consistent with small bowel obstruction. Electronically Signed   By: Myles Rosenthal M.D.   On: 12/01/2015 12:49   Dg Abd Portable 1v  Result Date: 11/30/2015 CLINICAL DATA:  Nasogastric tube placement EXAM: PORTABLE ABDOMEN - 1 VIEW COMPARISON:   11/30/2015 CT FINDINGS: The nasogastric tube extends well into the stomach with tip in the region of the distal gastric body. IMPRESSION: Nasogastric tube extends into the stomach. Electronically Signed   By: Ellery Plunk M.D.   On: 11/30/2015 22:55    Time Spent in minutes  35 minutes   Madell Heino M.D on 12/02/2015 at 10:21 AM  Between 7am to 7pm - Pager - [phone]  After 7pm go to [email or web address] - password [facility]  [facility] -  Office  [phone]

## 2015-12-03 DIAGNOSIS — E43 Unspecified severe protein-calorie malnutrition: Secondary | ICD-10-CM | POA: Insufficient documentation

## 2015-12-03 LAB — COMPREHENSIVE METABOLIC PANEL
ALBUMIN: 3.3 g/dL — AB (ref 3.5–5.0)
ALK PHOS: 59 U/L (ref 38–126)
ALT: 12 U/L — ABNORMAL LOW (ref 14–54)
ANION GAP: 8 (ref 5–15)
AST: 18 U/L (ref 15–41)
BILIRUBIN TOTAL: 0.4 mg/dL (ref 0.3–1.2)
BUN: 35 mg/dL — AB (ref 6–20)
CALCIUM: 9.4 mg/dL (ref 8.9–10.3)
CO2: 46 mmol/L — AB (ref 22–32)
Chloride: 99 mmol/L — ABNORMAL LOW (ref 101–111)
Creatinine, Ser: 1.6 mg/dL — ABNORMAL HIGH (ref 0.44–1.00)
GFR calc Af Amer: 33 mL/min — ABNORMAL LOW (ref >60)
GFR calc non Af Amer: 28 mL/min — ABNORMAL LOW (ref >60)
GLUCOSE: 163 mg/dL — AB (ref 65–99)
Potassium: 2.9 mmol/L — ABNORMAL LOW (ref 3.5–5.1)
SODIUM: 153 mmol/L — AB (ref 135–145)
TOTAL PROTEIN: 6.8 g/dL (ref 6.5–8.1)

## 2015-12-03 LAB — GLUCOSE, CAPILLARY
GLUCOSE-CAPILLARY: 120 mg/dL — AB (ref 65–99)
GLUCOSE-CAPILLARY: 142 mg/dL — AB (ref 65–99)
GLUCOSE-CAPILLARY: 150 mg/dL — AB (ref 65–99)

## 2015-12-03 LAB — APTT: aPTT: 91 seconds — ABNORMAL HIGH (ref 24–36)

## 2015-12-03 LAB — CBC
HEMATOCRIT: 34.5 % — AB (ref 36.0–46.0)
HEMOGLOBIN: 11.1 g/dL — AB (ref 12.0–15.0)
MCH: 28.1 pg (ref 26.0–34.0)
MCHC: 32.2 g/dL (ref 30.0–36.0)
MCV: 87.3 fL (ref 78.0–100.0)
Platelets: 223 10*3/uL (ref 150–400)
RBC: 3.95 MIL/uL (ref 3.87–5.11)
RDW: 14.3 % (ref 11.5–15.5)
WBC: 9.4 10*3/uL (ref 4.0–10.5)

## 2015-12-03 LAB — LIPASE, BLOOD: LIPASE: 37 U/L (ref 11–51)

## 2015-12-03 LAB — HEPARIN LEVEL (UNFRACTIONATED): Heparin Unfractionated: 0.69 IU/mL (ref 0.30–0.70)

## 2015-12-03 LAB — URINE CULTURE: Culture: 40000 — AB

## 2015-12-03 LAB — PHOSPHORUS: Phosphorus: 2.1 mg/dL — ABNORMAL LOW (ref 2.5–4.6)

## 2015-12-03 LAB — MAGNESIUM: Magnesium: 2.4 mg/dL (ref 1.7–2.4)

## 2015-12-03 MED ORDER — METOPROLOL TARTRATE 5 MG/5ML IV SOLN
5.0000 mg | Freq: Four times a day (QID) | INTRAVENOUS | Status: DC
  Administered 2015-12-03 – 2015-12-06 (×12): 5 mg via INTRAVENOUS
  Filled 2015-12-03 (×12): qty 5

## 2015-12-03 MED ORDER — SODIUM CHLORIDE 0.45 % IV SOLN
Freq: Once | INTRAVENOUS | Status: AC
  Administered 2015-12-03: 09:00:00 via INTRAVENOUS

## 2015-12-03 MED ORDER — TRACE MINERALS CR-CU-MN-SE-ZN 10-1000-500-60 MCG/ML IV SOLN
INTRAVENOUS | Status: DC
  Filled 2015-12-03: qty 960

## 2015-12-03 MED ORDER — FAT EMULSION 20 % IV EMUL
384.0000 mL | INTRAVENOUS | Status: DC
  Filled 2015-12-03: qty 500

## 2015-12-03 MED ORDER — POTASSIUM CHLORIDE 2 MEQ/ML IV SOLN
INTRAVENOUS | Status: DC
  Filled 2015-12-03: qty 1000

## 2015-12-03 MED ORDER — SODIUM CHLORIDE 0.45 % IV SOLN
Freq: Once | INTRAVENOUS | Status: AC
  Administered 2015-12-03: 14:00:00 via INTRAVENOUS
  Filled 2015-12-03: qty 1000

## 2015-12-03 MED ORDER — POTASSIUM PHOSPHATES 15 MMOLE/5ML IV SOLN
15.0000 mmol | Freq: Once | INTRAVENOUS | Status: AC
  Administered 2015-12-03: 15 mmol via INTRAVENOUS
  Filled 2015-12-03: qty 5

## 2015-12-03 MED ORDER — FAT EMULSION 20 % IV EMUL
192.0000 mL | INTRAVENOUS | Status: AC
  Administered 2015-12-03: 192 mL via INTRAVENOUS
  Filled 2015-12-03: qty 250

## 2015-12-03 MED ORDER — POTASSIUM CHLORIDE 2 MEQ/ML IV SOLN
INTRAVENOUS | Status: DC
  Administered 2015-12-03 – 2015-12-05 (×5): via INTRAVENOUS
  Filled 2015-12-03 (×11): qty 1000

## 2015-12-03 MED ORDER — AMIODARONE HCL IN DEXTROSE 360-4.14 MG/200ML-% IV SOLN
30.0000 mg/h | INTRAVENOUS | Status: DC
Start: 2015-12-03 — End: 2015-12-03
  Administered 2015-12-03: 30 mg/h via INTRAVENOUS
  Filled 2015-12-03: qty 200

## 2015-12-03 MED ORDER — TRACE MINERALS CR-CU-MN-SE-ZN 10-1000-500-60 MCG/ML IV SOLN
INTRAVENOUS | Status: AC
  Administered 2015-12-03: 18:00:00 via INTRAVENOUS
  Filled 2015-12-03: qty 960

## 2015-12-03 MED ORDER — INSULIN ASPART 100 UNIT/ML ~~LOC~~ SOLN
0.0000 [IU] | SUBCUTANEOUS | Status: AC
  Administered 2015-12-03: 1 [IU] via SUBCUTANEOUS
  Administered 2015-12-04: 2 [IU] via SUBCUTANEOUS
  Administered 2015-12-04 (×3): 1 [IU] via SUBCUTANEOUS
  Administered 2015-12-04: 3 [IU] via SUBCUTANEOUS
  Administered 2015-12-04 – 2015-12-06 (×5): 1 [IU] via SUBCUTANEOUS

## 2015-12-03 MED ORDER — SODIUM CHLORIDE 0.9 % IV BOLUS (SEPSIS): 500.0000 mL | Freq: Once | INTRAVENOUS | Status: DC

## 2015-12-03 MED ORDER — FLUCONAZOLE 150 MG PO TABS
150.0000 mg | ORAL_TABLET | Freq: Once | ORAL | Status: AC
  Administered 2015-12-03: 150 mg via ORAL
  Filled 2015-12-03: qty 1

## 2015-12-03 MED ORDER — SODIUM CHLORIDE 0.9 % IV SOLN
Freq: Once | INTRAVENOUS | Status: AC
  Administered 2015-12-03: 09:00:00 via INTRAVENOUS
  Filled 2015-12-03: qty 1000

## 2015-12-03 NOTE — Clinical Social Work Note (Signed)
Clinical Social Work Assessment  Patient Details  Name: Brandi Heath MRN: [id number] Date of Birth: 10-12-1927  Date of referral:  12/03/15               Reason for consult:  Facility Placement, Discharge Planning                Permission sought to share information with:  Facility Industrial/product designer granted to share information::  Yes, Verbal Permission Granted  Name::        Agency::     Relationship::     Contact Information:     Housing/Transportation Living arrangements for the past 2 months:  Skilled Nursing Facility Source of Information:  Facility, Adult Children Patient Interpreter Needed:  None Criminal Activity/Legal Involvement Pertinent to Current Situation/Hospitalization:  No - Comment as needed Significant Relationships:  Adult Children Lives with:  Facility Resident Do you feel safe going back to the place where you live?  Yes Need for family participation in patient care:  Yes (Comment)  Care giving concerns: No concerns reported at this time.   Social Worker assessment / plan: Pt hospitalized from [place] on 11/30/15 with Acute encephalopathy in the setting of severe dehydration complicated by UTI and small bowel obstruction and known history of dementia. Pt is unable to participate in d/c planning due to cognitive deficits. CSW spoke to pt's daughter, Brandi Heath [phone], to assist with d/c planning. Daughter reports that pt has been at [facility] for about 2 wks or so. " They are doing the best they can for her at the nursing home. " Daughter would like for pt to return to SNF at d/c. SNF contacted and clinicals provided. Pt is presently receiving TPN which [facility]  is unable to provide. SNF is willing to readmit proving TPN is not required at d/c. CSW will continue to follow to assist with d/c planning needs.  Employment status:  Retired Database administrator  PT Recommendations:  Not assessed at this time Information /  Referral to community resources:  Skilled Nursing Facility  Patient/Family's Response to care:  Daughter / MD agree with plan to continue treating pt for the next 2-3 days and then reassess. If no improvement, Palliative care Team will be consulted.  Patient/Family's Understanding of and Emotional Response to Diagnosis, Current Treatment, and Prognosis:  Daughter is aware of pt's medical status. She is hopeful that pt will improve and return to [place]. Daughter appreciates CSW assistance with d/c planning.  Emotional Assessment Appearance:  Appears stated age Attitude/Demeanor/Rapport:  Unable to Assess Affect (typically observed):  Unable to Assess Orientation:   (responds to voice) Alcohol / Substance use:  Not Applicable Psych involvement (Current and /or in the community):  No (Comment)  Discharge Needs  Concerns to be addressed:  Discharge Planning Concerns Readmission within the last 30 days:  Yes Current discharge risk:  None Barriers to Discharge:  No Barriers Identified   Vennie Homans  [phone] 12/03/2015, 3:48 PM

## 2015-12-03 NOTE — Progress Notes (Signed)
NUTRITION NOTE  Pt seen for initial assessment by RD 11/19 with associated note at 1226. Spoke with Pharmacist this AM who reports TPN to begin today at 1800. Pt remains NPO with NGT in place and per Surgeon note this AM, 3.4L output overnight. Pharmacist reports pt -2L since admission and repletion of K ongoing d/t high losses secondary to gastric decompression.   RD will follow-up 11/21.   Estimated Nutritional Needs:  Kcal:  1500-1700 Protein:  70-80g Fluid:  1.5-1.7L/day    Trenton Gammon, MS, RD, LDN Inpatient Clinical Dietitian Pager # [phone] After hours/weekend pager # [phone]

## 2015-12-03 NOTE — Progress Notes (Signed)
PHARMACY - ADULT TOTAL PARENTERAL NUTRITION CONSULT NOTE   Pharmacy Consult for TPN Indication: Recurrent SBO  Patient Measurements: Height: 5\' 9"  (175.3 cm) Weight: 119 lb 7.8 oz (54.2 kg) IBW/kg (Calculated) : 66.2 TPN AdjBW (KG): 54.2 Body mass index is 17.65 kg/m. Usual Weight: 77 kg  Insulin Requirements: none yesterday  Current Nutrition: NPO, D5 in IVF  IVF: D5 + 30 KCl @ 100 ml/hr  Central access: PICC 11/19 TPN start date: 12/03/15   ASSESSMENT                                                                                                          HPI: 82 y.o. female with medical history significant of  CAD, dementia, DVT/PE, OSA, AFib on anticoagulation, recurrent SBO, CHF, NICM with mitral regurgitation, seizure disorder, recent stroke; presented with N/V, abd pain, and AMS. Recently admitted for SBO in October which improved with conservative mgmt. NGT currently with high output and remains NPO  Significant events:  11/20: patient signalling for water; appears to be thirsty  Today:    Glucose - No Hx DM; some elevated readings (96-176)  Electrolytes - Bicarb high, Na rising slowly, and K, Cl, phos low d/t large GI losses; KCl in IVF and 80 mEq bolus per MD with minimal change from yesterday  Renal - SCr elevated but much improved; CrCl 22 ml/min  I/O - remains net negative from NGT losses  LFTs - albumin sl low; liver enzymes/Tbili wnl  TGs - pending  Prealbumin - pending  NUTRITIONAL GOALS                                                                                             RD recs: Kcal:  1500-1700 Protein:  70-80g Fluid:  1.5-1.7L/day  Clinimix 5/15 at a goal rate of 65 ml/hr + 20% fat emulsion at 8 ml/hr to provide: 78 g/day protein, 1492 Kcal/day.  PLAN                                                                                                                          KPhos 15 mmol IV x 1; will provide 22.5 mEq  K (extra K ok with MD,  K+ only improved minimally with 180 mEq yesterday)  Given increasing high Na and patient appears subjectively thirsty, discussed with MD and will change NS fluids to 1/2 NS (boluses only)  Monitor Na changes closely  At 1800 today:  Start Clinimix 5/15 plain at 40 ml/hr.  20% fat emulsion at 8 ml/hr.  Plan to advance as tolerated to the goal rate.  TPN to contain standard multivitamins and trace elements.  No change to IVFs.  Sens SSI with q4 CBG checks  TPN lab panels on Mondays & Thursdays.  F/u daily.  Bernadene Personrew Roddy Bellamy, PharmD, BCPS Pager: [phone] 12/03/2015, 10:00 AM

## 2015-12-03 NOTE — Progress Notes (Signed)
PROGRESS NOTE                                                                                                                                                                                                             Patient Demographics:    Brandi Heath, is a 82 y.o. female, DOB - Feb 22, 1927, ZOX:[id number]  Admit date - 11/30/2015   Admitting Physician Therisa Doyne, MD  Outpatient Primary MD for the patient is Merrilee Seashore, MD  LOS - 3  Chief Complaint  Patient presents with  . Emesis       Brief Narrative   82 y.o. female with medical history significant of  CAD, dementia, DVT, OSA, D, atrial fibrillation on anticoagulation, recurrent small bowel obstruction, CHF nonischemic cardiomyopathy with mitral regurgitation, seizure disorder, recent stroke, As with abdominal pain, vomiting and altered mental status. Workup significant for small bowel obstruction due to adhesion per CT report, A. fib with RVR, UTI , AKI and elevated troponins.   Subjective:    Brandi Heath today more awake, follows simple commands, denies any abdominal pain.     Assessment  & Plan :    Active Problems:   Dementia with behavioral disturbance   Hypertensive heart disease with CHF (congestive heart failure) (HCC)   Atrial fibrillation with rapid ventricular response (HCC)   Acute encephalopathy   UTI (urinary tract infection)   SBO (small bowel obstruction)   Atrial fibrillation with RVR (HCC)   Acute renal failure (ARF) (HCC)   Dehydration   Pressure injury of skin   Protein-calorie malnutrition, severe  SBO - Patient with known history of recurrent SBO, most recent admission in 10/31/2015, improved with conservative management. - Still with significant NGT suction/output,X-ray still showing persistent SBO - Surgical consult appreciated, very poor operative candidate, moderate to high risk, continue conservative management, started TPN 11/20 per surgery  recommendation , discussed with daughter via phone regarding goals of care , plans to continue with the conservative management for next 2-3 days , if no improvement then would have a meeting regarding goals of care , comfort care versus surgery . - Repeat electrolytes aggressively to help with this by mouth.  A. fib with RVR - CHADS2vasc score of 8, on Eluiquis. currently on hold given his renal failure and patient is nothing by mouth. Currently On heparin GTT given  recent history of CVA -  nothing by mouth, can't give her home dose diltiazem and metoprolol, received IV digoxin with minimal improvement, heart rate better Controlled on amiodarone drip, will try to transition to IV metoprolol scheduled dose today.  Elevated troponins - Most likely in the setting of demand ischemia from atrial fibrillation, soft blood pressure, and renal function, troponin trend non-ACS pattern - She received aspirin per rectum on admission, on heparin GTT  Acute encephalopathy - Metabolic, most likely in the setting of severe dehydration, UTI, and acute renal failure.  UTI - Treated with levofloxacin, urine culture growing Proteus, resistant to quinolones, levofloxacin, she has significantly improved, afebrile, no leukocytosis, so no need for further antibiotics   Acute renal failure - Baseline creatinine 0.8, was 3.4 on admission - due to volume depletion from nausea, vomiting secondary to SBO, and proving with IV fluids, hold Lasix and nephrotoxic medication  Dementia - Resume donepezil when able to tolerate oral  History of seizures - Continue with IV Keppra as an able to take oral.  Chronic systolic CHF - Last EF of 40-45% on 10/06/2015. Patient on a beta blocker. No ACEi or ARB patient currently in acute renal failure will hold Lasix due to dehydration. Monitor closely as on IV fluids  Hypokalemia/hypophosphatemia - Secondary to significant NGT output, will replete.  Hypernatremia - Secondary to  volume depletion, continue with fluids, change to D5W, will give D5 half-normal saline fluid bolus today.  Code Status : DNR  Family Communication  : D/W daughter via phone 11/19, 11/20.  Disposition Plan  : remains in step down.  Consults  :  Gen Surgery  Procedures  : None  DVT Prophylaxis  :  On Heparin GTT  Lab Results  Component Value Date   PLT 223 12/03/2015    Antibiotics  :   Anti-infectives    Start     Dose/Rate Route Frequency Ordered Stop   12/03/15 0830  fluconazole (DIFLUCAN) tablet 150 mg     150 mg Oral  Once 12/03/15 0828 12/03/15 0859   12/02/15 2200  levofloxacin (LEVAQUIN) IVPB 500 mg  Status:  Discontinued     500 mg 100 mL/hr over 60 Minutes Intravenous Every 48 hours 11/30/15 2202 12/03/15 1036   11/30/15 2230  aztreonam (AZACTAM) 500 mg in dextrose 5 % 50 mL IVPB  Status:  Discontinued     500 mg 100 mL/hr over 30 Minutes Intravenous Every 8 hours 11/30/15 2203 12/01/15 0732   11/30/15 2145  levofloxacin (LEVAQUIN) IVPB 750 mg     750 mg 100 mL/hr over 90 Minutes Intravenous  Once 11/30/15 2130 11/30/15 2327   11/30/15 2145  aztreonam (AZACTAM) 2 g in dextrose 5 % 50 mL IVPB  Status:  Discontinued     2 g 100 mL/hr over 30 Minutes Intravenous  Once 11/30/15 2130 11/30/15 2203        Objective:   Vitals:   12/03/15 0700 12/03/15 0800 12/03/15 0900 12/03/15 1000  BP: 108/71 (!) 132/59 126/63 100/62  Pulse: (!) 112 (!) 42 91 66  Resp: 16 15 12 14   Temp:  (!) 96.5 F (35.8 C)    TempSrc:  Axillary    SpO2: 100% 98% 100% 97%  Weight:      Height:        Wt Readings from Last 3 Encounters:  12/01/15 54.2 kg (119 lb 7.8 oz)  11/14/15 66.2 kg (146 lb)  11/14/15 72.6 kg (160 lb)  Intake/Output Summary (Last 24 hours) at 12/03/15 1049 Last data filed at 12/03/15 1000  Gross per 24 hour  Intake          2451.52 ml  Output             2755 ml  Net          -303.48 ml     Physical Exam  Lethargic, But opens eyes, follow simple  commands, answer simple yes or no questions . Supple Neck,No JVD, Symmetrical Chest wall movement, Good air movement bilaterally, CTAB Irregular irregular, tachycardic,No Gallops,Rubs , + murmur, No Parasternal Heave +ve B.Sounds, Abd Soft, No tenderness, No Cyanosis, Clubbing or edema, No new Rash or bruise      Data Review:    CBC  Recent Labs Lab 11/30/15 1838 12/01/15 0316 12/02/15 0621 12/03/15 0645  WBC 10.4 9.8 8.8 9.4  HGB 14.3 11.7* 11.7* 11.1*  HCT 41.5 35.0* 36.0 34.5*  PLT 279 207 220 223  MCV 83.5 83.7 85.7 87.3  MCH 28.8 28.0 27.9 28.1  MCHC 34.5 33.4 32.5 32.2  RDW 13.6 13.5 14.0 14.3  LYMPHSABS 1.2  --   --   --   MONOABS 0.8  --   --   --   EOSABS 0.0  --   --   --   BASOSABS 0.0  --   --   --     Chemistries   Recent Labs Lab 11/30/15 1838 12/01/15 0316 12/02/15 0618 12/02/15 0621 12/03/15 0645  NA 143 146*  --  149* 153*  K 4.2 3.7  --  2.6* 2.9*  CL 92* 100*  --  103 99*  CO2 34* 33*  --  34* 46*  GLUCOSE 176* 96  --  101* 163*  BUN 60* 56*  --  45* 35*  CREATININE 3.40* 2.90*  --  2.00* 1.60*  CALCIUM 10.6* 9.0  --  9.3 9.4  MG  --  1.9 1.9  --  2.4  AST 24 22  --   --  18  ALT 16 13*  --   --  12*  ALKPHOS 86 62  --   --  59  BILITOT 0.8 0.7  --   --  0.4   ------------------------------------------------------------------------------------------------------------------ No results for input(s): CHOL, HDL, LDLCALC, TRIG, CHOLHDL, LDLDIRECT in the last 72 hours.  Lab Results  Component Value Date   HGBA1C 5.8 (H) 10/04/2015   ------------------------------------------------------------------------------------------------------------------  Recent Labs  12/01/15 0316  TSH 1.243   ------------------------------------------------------------------------------------------------------------------ No results for input(s): VITAMINB12, FOLATE, FERRITIN, TIBC, IRON, RETICCTPCT in the last 72 hours.  Coagulation profile  Recent  Labs Lab 11/30/15 1838  INR 1.47    No results for input(s): DDIMER in the last 72 hours.  Cardiac Enzymes  Recent Labs Lab 12/01/15 0316 12/01/15 0755 12/02/15 0621  TROPONINI 0.10* 0.12* 0.07*   ------------------------------------------------------------------------------------------------------------------ No results found for: BNP  Inpatient Medications  Scheduled Meds: . sodium chloride   Intravenous Once  . chlorhexidine  15 mL Mouth Rinse BID  . insulin aspart  0-9 Units Subcutaneous Q4H  . levETIRAcetam  750 mg Intravenous Q12H  . mouth rinse  15 mL Mouth Rinse q12n4p  . metoprolol  5 mg Intravenous Q6H  . potassium phosphate IVPB (mmol)  15 mmol Intravenous Once  . sodium chloride 0.45 % 1,000 mL with potassium chloride 80 mEq infusion   Intravenous Once  . sodium chloride flush  10-40 mL Intracatheter Q12H  . sodium chloride flush  3 mL Intravenous Q12H   Continuous Infusions: . dextrose 5 % with kcl 100 mL/hr at 12/03/15 0923  . fat emulsion     And  . TPN (CLINIMIX) Adult without lytes    . heparin 700 Units/hr (12/03/15 1000)   PRN Meds:.acetaminophen **OR** acetaminophen, ondansetron **OR** ondansetron (ZOFRAN) IV, sodium chloride flush  Micro Results Recent Results (from the past 240 hour(s))  Urine culture     Status: Abnormal   Collection Time: 11/30/15  7:59 PM  Result Value Ref Range Status   Specimen Description URINE, CATHETERIZED  Final   Special Requests NONE  Final   Culture 40,000 COLONIES/mL PROTEUS MIRABILIS (A)  Final   Report Status 12/03/2015 FINAL  Final   Organism ID, Bacteria PROTEUS MIRABILIS (A)  Final      Susceptibility   Proteus mirabilis - MIC*    AMPICILLIN >=32 RESISTANT Resistant     CEFAZOLIN <=4 SENSITIVE Sensitive     CEFTRIAXONE <=1 SENSITIVE Sensitive     CIPROFLOXACIN >=4 RESISTANT Resistant     GENTAMICIN <=1 SENSITIVE Sensitive     IMIPENEM 2 SENSITIVE Sensitive     NITROFURANTOIN 256 RESISTANT Resistant      TRIMETH/SULFA >=320 RESISTANT Resistant     AMPICILLIN/SULBACTAM 16 INTERMEDIATE Intermediate     PIP/TAZO <=4 SENSITIVE Sensitive     * 40,000 COLONIES/mL PROTEUS MIRABILIS  MRSA PCR Screening     Status: None   Collection Time: 11/30/15 11:46 PM  Result Value Ref Range Status   MRSA by PCR NEGATIVE NEGATIVE Final    Comment:        The GeneXpert MRSA Assay (FDA approved for NASAL specimens only), is one component of a comprehensive MRSA colonization surveillance program. It is not intended to diagnose MRSA infection nor to guide or monitor treatment for MRSA infections.     Radiology Reports Ct Abdomen Pelvis Wo Contrast  Result Date: 11/30/2015 CLINICAL DATA:  Vomiting. Generalized abdominal pain and distention. EXAM: CT ABDOMEN AND PELVIS WITHOUT CONTRAST TECHNIQUE: Multidetector CT imaging of the abdomen and pelvis was performed following the standard protocol without IV contrast. COMPARISON:  10/01/2015 FINDINGS: Lower chest: There is cardiomegaly. Chronic opacities, likely scarring in the lung bases. No effusions. Densely calcified coronary arteries noted. There is a moderate-sized hiatal hernia which is fluid-filled. Hepatobiliary: Prior cholecystectomy.  No focal hepatic abnormality. Pancreas: Diffuse atrophy of the pancreas without visualized focal abnormality or ductal dilatation. Spleen: No focal abnormality.  Normal size. Adrenals/Urinary Tract: Small cyst in the midpole of the right kidney. No hydronephrosis. Adrenal gland is unremarkable. Urinary bladder decompressed. Stomach/Bowel: The stomach is markedly distended with fluid. There are dilated small bowel loops in the right abdomen and extending into the pelvis. Transition point is in the mid pelvis on image 64, presumably related to adhesions. Findings compatible with distal small bowel obstruction. Colon is decompressed, grossly unremarkable. Vascular/Lymphatic: Prior stent graft repair of abdominal aortic aneurysm.  The aneurysm sac size is 5.1 cm compared with 5.8 cm previously. No adenopathy. Reproductive: Prior hysterectomy.  No adnexal masses. Other: No free fluid or free air. Musculoskeletal: No acute bony abnormality or focal bone lesion. Diffuse degenerative disc and facet disease in the lumbar spine. IMPRESSION: Distal small bowel obstruction, likely related to adhesions. Moderate-sized fluid-filled hiatal hernia. Coronary artery disease, aortic atherosclerosis. Prior stent graft repair of AAA. Decreasing aneurysm sac size since prior study. Electronically Signed   By: Charlett Nose M.D.   On: 11/30/2015 20:25   Dg Chest 2  View  Result Date: 11/14/2015 CLINICAL DATA:  Altered mental status. EXAM: CHEST  2 VIEW COMPARISON:  Chest radiograph 10/28/2015 FINDINGS: Cardiomediastinal contours are unchanged. Diffusely increased pulmonary markings remain. There is no focal airspace consolidation. No pneumothorax or sizable pleural effusion. IMPRESSION: 1. No radiographic evidence of pneumonia. 2. Unchanged findings of chronic lung disease with diffusely increased pulmonary markings and bibasilar no nodular opacities. Electronically Signed   By: Deatra Robinson M.D.   On: 11/14/2015 14:16   Dg Abd 1 View  Result Date: 12/01/2015 CLINICAL DATA:  Small bowel obstruction EXAM: ABDOMEN - 1 VIEW COMPARISON:  11/30/2015 FINDINGS: Dilated small bowel loops again noted right abdomen and pelvis compatible with small bowel obstruction, unchanged. NG tube remains in the stomach. Stent graft again noted. IVC filter noted. No free air or visible organomegaly. IMPRESSION: Continued small bowel dilatation compatible with small bowel obstruction. No real change. Electronically Signed   By: Charlett Nose M.D.   On: 12/01/2015 07:09   Ct Head Wo Contrast  Result Date: 11/14/2015 CLINICAL DATA:  Altered mental status/ lethargy.  Left arm weakness EXAM: CT HEAD WITHOUT CONTRAST TECHNIQUE: Contiguous axial images were obtained from the base  of the skull through the vertex without intravenous contrast. COMPARISON:  Brain MRI October 03, 2015 FINDINGS: Brain: There is moderate diffuse atrophy, unchanged. Note that there is fairly severe cerebellar atrophy, likewise stable. There is no intracranial mass, hemorrhage, extra-axial fluid collection, or midline shift. There is patchy small vessel disease in the centra semiovale bilaterally. There is no new gray-white compartment lesion. No acute infarct evident. Vascular: There is no demonstrable hyperdense vessel. There is calcification in the carotid siphon regions bilaterally. Skull: The bony calvarium appears intact. Sinuses/Orbits: Visualized paranasal sinuses are clear. Orbits appear symmetric bilaterally. Other: Mastoid air cells are clear. IMPRESSION: Diffuse atrophy, stable. Patchy periventricular small vessel disease, stable. No acute infarct is demonstrable on this study. No hemorrhage, mass, or extra-axial fluid collection. Carotid siphon region vascular calcification evident bilaterally. Electronically Signed   By: Bretta Bang III M.D.   On: 11/14/2015 13:54   Mr Brain Wo Contrast  Result Date: 11/14/2015 CLINICAL DATA:  Altered mental status and lethargy EXAM: MRI HEAD WITHOUT CONTRAST TECHNIQUE: Multiplanar, multiecho pulse sequences of the brain and surrounding structures were obtained without intravenous contrast. COMPARISON:  Head CT 11/14/2015, brain MRI 10/03/2015 FINDINGS: Brain: No acute infarct or intraparenchymal hemorrhage. The midline structures are normal. There is beginning confluent hyperintense T2-weighted signal within the periventricular white matter, most often seen in the setting of chronic microvascular ischemia. There is diffuse atrophy with mild parietal predominance. No mass lesion or midline shift. No hydrocephalus or extra-axial fluid collection. Vascular: Major intracranial arterial and venous sinus flow voids are preserved. Chronic hemosiderin deposition  within the right caudate body. Skull and upper cervical spine: The visualized skull base, calvarium, upper cervical spine and extracranial soft tissues are normal. Sinuses/Orbits: No fluid levels or advanced mucosal thickening. No mastoid effusion. Normal orbits. IMPRESSION: 1. No acute intracranial abnormality. 2. Findings of chronic microvascular disease and diffuse atrophy, with mild parietal predominance. Electronically Signed   By: Deatra Robinson M.D.   On: 11/14/2015 19:53   Dg Chest Port 1 View  Result Date: 12/02/2015 CLINICAL DATA:  Post PICC line insertion EXAM: PORTABLE CHEST 1 VIEW COMPARISON:  November 14, 2015 FINDINGS: The heart, hila, and mediastinum are unchanged. The right PICC line terminates near the cavoatrial junction. The distal tip of the NG tube is not assessed. There  is tubing coiled back on itself projected over the midline of the upper chest, likely the portion of the NG tube outside dictation. High attenuation material in the lung bases is stable, possibly aspirated material. No other interval changes or acute abnormalities. IMPRESSION: 1. The right PICC line terminates near the caval atrial junction. Electronically Signed   By: Gerome Sam III M.D   On: 12/02/2015 18:49   Dg Abd Portable 1v  Result Date: 12/02/2015 CLINICAL DATA:  Small bowel obstruction. EXAM: PORTABLE ABDOMEN - 1 VIEW COMPARISON:  December 01, 2015 FINDINGS: A small bowel obstruction persists. No contrast is identified on today's study. However, only minimal contrast is seen in stomach previously. NG tube terminates in the left upper quadrant. IMPRESSION: Persistent small bowel obstruction. Electronically Signed   By: Gerome Sam III M.D   On: 12/02/2015 16:01   Dg Abd Portable 1v-small Bowel Obstruction Protocol-initial, 8 Hr Delay  Result Date: 12/01/2015 CLINICAL DATA:  Small bowel obstruction. EXAM: PORTABLE ABDOMEN - 1 VIEW COMPARISON:  December 01, 2015 FINDINGS: The NG tube is stable. A  small amount of contrast is seen in the stomach, new in the interval. No other contrast seen throughout the abdomen. Persistently dilated small bowel loops in the right abdomen consistent with small bowel obstruction. No other change. IMPRESSION: Small bowel obstruction. Electronically Signed   By: Gerome Sam III M.D   On: 12/01/2015 22:49   Dg Abd Portable 1v-small Bowel Protocol-position Verification  Result Date: 12/01/2015 CLINICAL DATA:  Followup small bowel obstruction. Nasogastric tube placement. EXAM: PORTABLE ABDOMEN - 1 VIEW COMPARISON:  12/01/2015 FINDINGS: Nasogastric tube is seen with tip in the body the stomach. Moderately dilated small bowel loops in the right abdomen show no significant change compared to recent study. Aortoiliac stent graft again seen as well as embolization coils adjacent to the right iliac limb of the graft. IVC filter again noted. IMPRESSION: Nasogastric tube tip remains in the body the stomach. No significant change in dilated small bowel loops, consistent with small bowel obstruction. Electronically Signed   By: Myles Rosenthal M.D.   On: 12/01/2015 12:49   Dg Abd Portable 1v  Result Date: 11/30/2015 CLINICAL DATA:  Nasogastric tube placement EXAM: PORTABLE ABDOMEN - 1 VIEW COMPARISON:  11/30/2015 CT FINDINGS: The nasogastric tube extends well into the stomach with tip in the region of the distal gastric body. IMPRESSION: Nasogastric tube extends into the stomach. Electronically Signed   By: Ellery Plunk M.D.   On: 11/30/2015 22:55    Time Spent in minutes  35 minutes   Danilyn Cocke M.D on 12/03/2015 at 10:49 AM  Between 7am to 7pm - Pager - [phone]  After 7pm go to [email or web address] - password [facility]  [facility] -  Office  [phone]

## 2015-12-03 NOTE — Progress Notes (Signed)
Pharmacy Antibiotic Note  Brandi Heath is a 82 y.o. female recently discharged from [facility] on 11/15/15, presented to the ED from nursing home on 11/17 with c/o n/v and abd pain.  UA showed large leukocytes.  Patient has PCN listed as an allergy. To start levaquin and aztreonam for suspected UTI/sepsis.  Today, 12/03/2015: Proteus in urine R-Cipro  Plan: Discussed abx with MD; UA with many SQ cells and low colony count. Likely can attribute encephalopathy to other metabolic causes given GI issues; will discontinue Levaquin.  ___________________________  Height: 5\' 9"  (175.3 cm) Weight: 119 lb 7.8 oz (54.2 kg) IBW/kg (Calculated) : 66.2  Temp (24hrs), Avg:97.7 F (36.5 C), Min:96.5 F (35.8 C), Max:98.9 F (37.2 C)   Recent Labs Lab 11/30/15 1838 11/30/15 1854 11/30/15 1855 12/01/15 0009 12/01/15 0316 12/02/15 0621 12/03/15 0645  WBC 10.4  --   --   --  9.8 8.8 9.4  CREATININE 3.40*  --   --   --  2.90* 2.00* 1.60*  LATICACIDVEN  --  2.58* 2.57* 1.3 1.8  --   --     Estimated Creatinine Clearance: 21.6 mL/min (by C-G formula based on SCr of 1.6 mg/dL (H)).    Allergies  Allergen Reactions  . Chocolate Diarrhea  . Lactose Intolerance (Gi) Diarrhea  . Penicillins Other (See Comments)    Reaction:  Unknown  Has patient had a PCN reaction causing immediate rash, facial/tongue/throat swelling, SOB or lightheadedness with hypotension: Unsure Has patient had a PCN reaction causing severe rash involving mucus membranes or skin necrosis: Unsure Has patient had a PCN reaction that required hospitalization:  Unsure  Has patient had a PCN reaction occurring within the last 10 years: Unsure If all of the above answers are "NO", then may proceed with Cephalosporin use.   Antimicrobials this admission:  11/17 aztreo >> 11/18 11/17 LVQ >>   Microbiology results:  11/17 UCx: 40k proteus R-FQs, Amp, Bactrim 11/17 MRSA PCR: neg 11/17 UA: large leukocytes  Thank you for allowing  pharmacy to be a part of this patient's care.  Bernadene Person, PharmD, BCPS Pager: [phone] 12/03/2015, 10:36 AM

## 2015-12-03 NOTE — Progress Notes (Signed)
ANTICOAGULATION CONSULT NOTE - Follow Up Consult  Pharmacy Consult for Heparin Indication: atrial fibrillation  Patient Measurements: Height: 5\' 9"  (175.3 cm) Weight: 119 lb 7.8 oz (54.2 kg) IBW/kg (Calculated) : 66.2 Heparin Dosing Weight: actual weight  Medications:  Infusions:  . dextrose 5 % with kcl 100 mL/hr at 12/03/15 0923  . TPN (CLINIMIX) Adult without lytes     And  . fat emulsion    . heparin 700 Units/hr (12/03/15 0600)    Assessment: 27 yoF admitted with UTI/sepsis. PMH significant for AFib and patient is on oral anticoagulation PTA with Eliquis, held for NPO status.  Pharmacy consulted to dose IV heparin for AFib, while Eliquis is on hold.  Today, 12/03/15  HL still supratherapeutic as expected d/t Eliquis but starting to decrease as Eliquis is eliminated from body.   APTT continues to be therapeutic on heparin drip rate of 700 units/hr  CBC stable with Hgb sl low; reasonable for age  CrCl low but improving  No bleeding or infusion issues per RN  Goal of Therapy:  Heparin level 0.3-0.7 units/ml aPTT 66-102 seconds Monitor platelets by anticoagulation protocol: Yes   Plan:   Continue current IV heparin rate at 700 units/hr  Daily aPTT/heparin level and CBC  Monitor for signs of bleeding or thrombosis   Bernadene Person, PharmD, BCPS Pager: [phone] 12/03/2015, 10:02 AM

## 2015-12-03 NOTE — Progress Notes (Signed)
Subjective: No family BS. Denies abd pain. High NG tube output 3.4L  Objective: Vital signs in last 24 hours: Temp:  [96.5 F (35.8 C)-98.9 F (37.2 C)] 96.5 F (35.8 C) (11/20 0800) Pulse Rate:  [42-129] 42 (11/20 0800) Resp:  [7-21] 15 (11/20 0800) BP: (91-142)/(47-90) 132/59 (11/20 0800) SpO2:  [94 %-100 %] 98 % (11/20 0800) Last BM Date: 11/30/15  Intake/Output from previous day: 11/19 0701 - 11/20 0700 In: 2267.7 [I.V.:1922.7; NG/GT:30; IV Piggyback:315] Out: 3530 [Urine:130; Emesis/NG output:3400] Intake/Output this shift: Total I/O In: -  Out: 125 [Emesis/NG output:125]  Asleep, easily awakens Follows commands, says it is nov 2017 - looking at wall so maybe reading calendar on wall Non labored irreg irreg Soft, nd, nt NG - brown  Lab Results:   Recent Labs  12/02/15 0621 12/03/15 0645  WBC 8.8 9.4  HGB 11.7* 11.1*  HCT 36.0 34.5*  PLT 220 223   BMET  Recent Labs  12/02/15 0621 12/03/15 0645  NA 149* 153*  K 2.6* 2.9*  CL 103 99*  CO2 34* 46*  GLUCOSE 101* 163*  BUN 45* 35*  CREATININE 2.00* 1.60*  CALCIUM 9.3 9.4   PT/INR  Recent Labs  11/30/15 1838  LABPROT 17.9*  INR 1.47   ABG No results for input(s): PHART, HCO3 in the last 72 hours.  Invalid input(s): PCO2, PO2  Studies/Results: Dg Chest Port 1 View  Result Date: 12/02/2015 CLINICAL DATA:  Post PICC line insertion EXAM: PORTABLE CHEST 1 VIEW COMPARISON:  November 14, 2015 FINDINGS: The heart, hila, and mediastinum are unchanged. The right PICC line terminates near the cavoatrial junction. The distal tip of the NG tube is not assessed. There is tubing coiled back on itself projected over the midline of the upper chest, likely the portion of the NG tube outside dictation. High attenuation material in the lung bases is stable, possibly aspirated material. No other interval changes or acute abnormalities. IMPRESSION: 1. The right PICC line terminates near the caval atrial junction.  Electronically Signed   By: Gerome Sam III M.D   On: 12/02/2015 18:49   Dg Abd Portable 1v  Result Date: 12/02/2015 CLINICAL DATA:  Small bowel obstruction. EXAM: PORTABLE ABDOMEN - 1 VIEW COMPARISON:  December 01, 2015 FINDINGS: A small bowel obstruction persists. No contrast is identified on today's study. However, only minimal contrast is seen in stomach previously. NG tube terminates in the left upper quadrant. IMPRESSION: Persistent small bowel obstruction. Electronically Signed   By: Gerome Sam III M.D   On: 12/02/2015 16:01   Dg Abd Portable 1v-small Bowel Obstruction Protocol-initial, 8 Hr Delay  Result Date: 12/01/2015 CLINICAL DATA:  Small bowel obstruction. EXAM: PORTABLE ABDOMEN - 1 VIEW COMPARISON:  December 01, 2015 FINDINGS: The NG tube is stable. A small amount of contrast is seen in the stomach, new in the interval. No other contrast seen throughout the abdomen. Persistently dilated small bowel loops in the right abdomen consistent with small bowel obstruction. No other change. IMPRESSION: Small bowel obstruction. Electronically Signed   By: Gerome Sam III M.D   On: 12/01/2015 22:49   Dg Abd Portable 1v-small Bowel Protocol-position Verification  Result Date: 12/01/2015 CLINICAL DATA:  Followup small bowel obstruction. Nasogastric tube placement. EXAM: PORTABLE ABDOMEN - 1 VIEW COMPARISON:  12/01/2015 FINDINGS: Nasogastric tube is seen with tip in the body the stomach. Moderately dilated small bowel loops in the right abdomen show no significant change compared to recent study. Aortoiliac stent graft again seen  as well as embolization coils adjacent to the right iliac limb of the graft. IVC filter again noted. IMPRESSION: Nasogastric tube tip remains in the body the stomach. No significant change in dilated small bowel loops, consistent with small bowel obstruction. Electronically Signed   By: Myles RosenthalJohn  Stahl M.D.   On: 12/01/2015 12:49     Anti-infectives: Anti-infectives    Start     Dose/Rate Route Frequency Ordered Stop   12/03/15 0830  fluconazole (DIFLUCAN) tablet 150 mg     150 mg Oral  Once 12/03/15 0828 12/03/15 0859   12/02/15 2200  levofloxacin (LEVAQUIN) IVPB 500 mg     500 mg 100 mL/hr over 60 Minutes Intravenous Every 48 hours 11/30/15 2202     11/30/15 2230  aztreonam (AZACTAM) 500 mg in dextrose 5 % 50 mL IVPB  Status:  Discontinued     500 mg 100 mL/hr over 30 Minutes Intravenous Every 8 hours 11/30/15 2203 12/01/15 0732   11/30/15 2145  levofloxacin (LEVAQUIN) IVPB 750 mg     750 mg 100 mL/hr over 90 Minutes Intravenous  Once 11/30/15 2130 11/30/15 2327   11/30/15 2145  aztreonam (AZACTAM) 2 g in dextrose 5 % 50 mL IVPB  Status:  Discontinued     2 g 100 mL/hr over 30 Minutes Intravenous  Once 11/30/15 2130 11/30/15 2203      Assessment/Plan: Pancreatitis SBO Dementia AKI Atrial fibrillation with RVR Cardiomyopathy H/o dvt/pe Recent TIA  Has persistent SBO. No fever. No wbc so I don't think her SBO is worsening; however it is not improving.  Would cont non op mgmt for now - bowel rest, NG tube, TPN She would be at mod-high risk for surgery. Not able to pull up NSQIP surgical risk calculator scoring website - page down. Will try later.  [facility]  Would rec palliative care consult to help guide discussion regarding goals of care, etc.   Mary SellaEric M. Andrey CampanileWilson, MD, FACS General, Bariatric, & Minimally Invasive Surgery [facility] [place]   LOS: 3 days    Atilano InaWILSON,Jaydan Meidinger M 12/03/2015

## 2015-12-04 DIAGNOSIS — Z7189 Other specified counseling: Secondary | ICD-10-CM

## 2015-12-04 DIAGNOSIS — Z515 Encounter for palliative care: Secondary | ICD-10-CM

## 2015-12-04 DIAGNOSIS — E43 Unspecified severe protein-calorie malnutrition: Secondary | ICD-10-CM

## 2015-12-04 LAB — BASIC METABOLIC PANEL
BUN: 29 mg/dL — AB (ref 6–20)
CHLORIDE: 86 mmol/L — AB (ref 101–111)
Calcium: 9.7 mg/dL (ref 8.9–10.3)
Creatinine, Ser: 1.6 mg/dL — ABNORMAL HIGH (ref 0.44–1.00)
GFR calc Af Amer: 33 mL/min — ABNORMAL LOW (ref >60)
GFR calc non Af Amer: 28 mL/min — ABNORMAL LOW (ref >60)
GLUCOSE: 162 mg/dL — AB (ref 65–99)
POTASSIUM: 3.2 mmol/L — AB (ref 3.5–5.1)
Sodium: 151 mmol/L — ABNORMAL HIGH (ref 135–145)

## 2015-12-04 LAB — GLUCOSE, CAPILLARY
GLUCOSE-CAPILLARY: 177 mg/dL — AB (ref 65–99)
GLUCOSE-CAPILLARY: 147 mg/dL — AB (ref 65–99)
Glucose-Capillary: 164 mg/dL — ABNORMAL HIGH (ref 65–99)
Glucose-Capillary: 139 mg/dL — ABNORMAL HIGH (ref 65–99)
Glucose-Capillary: 117 mg/dL — ABNORMAL HIGH (ref 65–99)
Glucose-Capillary: 122 mg/dL — ABNORMAL HIGH (ref 65–99)

## 2015-12-04 LAB — CBC
HCT: 35.8 % — ABNORMAL LOW (ref 36.0–46.0)
Hemoglobin: 11.6 g/dL — ABNORMAL LOW (ref 12.0–15.0)
MCH: 28.2 pg (ref 26.0–34.0)
MCHC: 32.4 g/dL (ref 30.0–36.0)
MCV: 86.9 fL (ref 78.0–100.0)
PLATELETS: 220 10*3/uL (ref 150–400)
RBC: 4.12 MIL/uL (ref 3.87–5.11)
RDW: 14.2 % (ref 11.5–15.5)
WBC: 11.1 10*3/uL — AB (ref 4.0–10.5)

## 2015-12-04 LAB — BLOOD GAS, VENOUS
ACID-BASE EXCESS: 24.1 mmol/L — AB (ref 0.0–2.0)
Bicarbonate: 51.4 mmol/L — ABNORMAL HIGH (ref 20.0–28.0)
O2 Saturation: 59 %
PH VEN: 7.518 — AB (ref 7.250–7.430)
Patient temperature: 37
pCO2, Ven: 63.6 mmHg — ABNORMAL HIGH (ref 44.0–60.0)
pO2, Ven: 32.3 mmHg (ref 32.0–45.0)

## 2015-12-04 LAB — PHOSPHORUS: Phosphorus: 2.5 mg/dL (ref 2.5–4.6)

## 2015-12-04 LAB — HEPARIN LEVEL (UNFRACTIONATED): HEPARIN UNFRACTIONATED: 0.48 [IU]/mL (ref 0.30–0.70)

## 2015-12-04 LAB — APTT: APTT: 76 s — AB (ref 24–36)

## 2015-12-04 LAB — HEMOGLOBIN A1C
Hgb A1c MFr Bld: 5.9 % — ABNORMAL HIGH (ref 4.8–5.6)
Mean Plasma Glucose: 123 mg/dL

## 2015-12-04 LAB — PREALBUMIN: PREALBUMIN: 17 mg/dL — AB (ref 18–38)

## 2015-12-04 LAB — MAGNESIUM: Magnesium: 1.9 mg/dL (ref 1.7–2.4)

## 2015-12-04 LAB — TRIGLYCERIDES: Triglycerides: 88 mg/dL (ref <150)

## 2015-12-04 MED ORDER — DILTIAZEM HCL-DEXTROSE 100-5 MG/100ML-% IV SOLN (PREMIX)
5.0000 mg/h | INTRAVENOUS | Status: DC
  Administered 2015-12-04: 5 mg/h via INTRAVENOUS
  Administered 2015-12-04: 10 mg/h via INTRAVENOUS
  Administered 2015-12-06 (×2): 5 mg/h via INTRAVENOUS
  Administered 2015-12-07 – 2015-12-08 (×3): 10 mg/h via INTRAVENOUS
  Administered 2015-12-08: 12.5 mg/h via INTRAVENOUS
  Administered 2015-12-08: 10 mg/h via INTRAVENOUS
  Administered 2015-12-09: 15 mg/h via INTRAVENOUS
  Filled 2015-12-04 (×9): qty 100

## 2015-12-04 MED ORDER — TRACE MINERALS CR-CU-MN-SE-ZN 10-1000-500-60 MCG/ML IV SOLN
INTRAVENOUS | Status: AC
  Administered 2015-12-04: 17:00:00 via INTRAVENOUS
  Filled 2015-12-04 (×2): qty 1680

## 2015-12-04 MED ORDER — SODIUM CHLORIDE 0.45 % IV BOLUS: 1500.0000 mL | Freq: Once | INTRAVENOUS | Status: DC

## 2015-12-04 MED ORDER — SODIUM CHLORIDE 0.45 % IV BOLUS
2000.0000 mL | Freq: Once | INTRAVENOUS | Status: AC
  Administered 2015-12-04: 335 mL via INTRAVENOUS

## 2015-12-04 MED ORDER — METOPROLOL TARTRATE 5 MG/5ML IV SOLN
INTRAVENOUS | Status: AC
  Administered 2015-12-04: 5 mg
  Filled 2015-12-04: qty 5

## 2015-12-04 MED ORDER — PANTOPRAZOLE SODIUM 40 MG IV SOLR
40.0000 mg | Freq: Two times a day (BID) | INTRAVENOUS | Status: DC
  Administered 2015-12-04 – 2015-12-20 (×33): 40 mg via INTRAVENOUS
  Filled 2015-12-04 (×33): qty 40

## 2015-12-04 MED ORDER — FAT EMULSION 20 % IV EMUL
240.0000 mL | INTRAVENOUS | Status: AC
  Administered 2015-12-04: 240 mL via INTRAVENOUS
  Filled 2015-12-04 (×2): qty 250

## 2015-12-04 MED ORDER — SODIUM CHLORIDE 0.45 % IV SOLN
Freq: Once | INTRAVENOUS | Status: AC
  Administered 2015-12-04: 10:00:00 via INTRAVENOUS
  Filled 2015-12-04: qty 1000

## 2015-12-04 MED ORDER — SODIUM CHLORIDE 0.9 % IV BOLUS (SEPSIS)
500.0000 mL | Freq: Once | INTRAVENOUS | Status: AC
  Administered 2015-12-04: 500 mL via INTRAVENOUS

## 2015-12-04 NOTE — Progress Notes (Signed)
Subjective: No significant changes. Patient denies abdominal pain. Had 4.8 L of her NG tube. Denies flatus  Objective: Vital signs in last 24 hours: Temp:  [97.3 F (36.3 C)-99.3 F (37.4 C)] 98 F (36.7 C) (11/21 1200) Pulse Rate:  [31-110] 35 (11/21 1400) Resp:  [13-22] 14 (11/21 1400) BP: (72-134)/(39-102) 107/67 (11/21 1400) SpO2:  [95 %-100 %] 97 % (11/21 1400) Last BM Date: 11/30/15  Intake/Output from previous day: 11/20 0701 - 11/21 0700 In: 2544.5 [I.V.:2182; IV Piggyback:362.5] Out: 6525 [Urine:1725; Emesis/NG output:4800] Intake/Output this shift: Total I/O In: 3362.7 [I.V.:1362.7; IV Piggyback:2000] Out: 1300 [Urine:300; Emesis/NG output:1000]  Asleep, easily awakens, says it's 2017, follows commands. Otherwise I can't get anything out from her Nonlabored breathing Irregular heartbeat Off, nontender, not really distended  Lab Results:   Recent Labs  12/03/15 0645 12/04/15 0516  WBC 9.4 11.1*  HGB 11.1* 11.6*  HCT 34.5* 35.8*  PLT 223 220   BMET  Recent Labs  12/03/15 0645 12/04/15 0516  NA 153* 151*  K 2.9* 3.2*  CL 99* 86*  CO2 46* >50*  GLUCOSE 163* 162*  BUN 35* 29*  CREATININE 1.60* 1.60*  CALCIUM 9.4 9.7   PT/INR No results for input(s): LABPROT, INR in the last 72 hours. ABG  Recent Labs  12/04/15 1110  HCO3 51.4*    Studies/Results: Dg Chest Port 1 View  Result Date: 12/02/2015 CLINICAL DATA:  Post PICC line insertion EXAM: PORTABLE CHEST 1 VIEW COMPARISON:  November 14, 2015 FINDINGS: The heart, hila, and mediastinum are unchanged. The right PICC line terminates near the cavoatrial junction. The distal tip of the NG tube is not assessed. There is tubing coiled back on itself projected over the midline of the upper chest, likely the portion of the NG tube outside dictation. High attenuation material in the lung bases is stable, possibly aspirated material. No other interval changes or acute abnormalities. IMPRESSION: 1. The  right PICC line terminates near the caval atrial junction. Electronically Signed   By: Gerome Samavid  Williams III M.D   On: 12/02/2015 18:49    Anti-infectives: Anti-infectives    Start     Dose/Rate Route Frequency Ordered Stop   12/03/15 0830  fluconazole (DIFLUCAN) tablet 150 mg     150 mg Oral  Once 12/03/15 0828 12/03/15 0859   12/02/15 2200  levofloxacin (LEVAQUIN) IVPB 500 mg  Status:  Discontinued     500 mg 100 mL/hr over 60 Minutes Intravenous Every 48 hours 11/30/15 2202 12/03/15 1036   11/30/15 2230  aztreonam (AZACTAM) 500 mg in dextrose 5 % 50 mL IVPB  Status:  Discontinued     500 mg 100 mL/hr over 30 Minutes Intravenous Every 8 hours 11/30/15 2203 12/01/15 0732   11/30/15 2145  levofloxacin (LEVAQUIN) IVPB 750 mg     750 mg 100 mL/hr over 90 Minutes Intravenous  Once 11/30/15 2130 11/30/15 2327   11/30/15 2145  aztreonam (AZACTAM) 2 g in dextrose 5 % 50 mL IVPB  Status:  Discontinued     2 g 100 mL/hr over 30 Minutes Intravenous  Once 11/30/15 2130 11/30/15 2203      Assessment/Plan: Pancreatitis - resolve SBO Dementia AKI Atrial fibrillation with RVR Cardiomyopathy H/o dvt/pe Recent TIA vs seizure  She has a persistent small bowel obstruction. She still has significant nasogastric tube output. No fever. White count up slightly but could be due to volume contraction from high NG tube output. No family at bedside.  Palliative care consult planned for  tomorrow. Spoke with cardiology consult team. They recommended holding off on cardiac evaluation until outcome palliative care meeting  Options are to continue nonoperative management however at this point I do not think she will resolve without surgery. This is sort of her third admission for SBO within the past couple of months as best I can tell from hospital records which suggests a chronic obstruction.  Options are palliative care with comfort measures versus surgical intervention. She would obviously be a moderate  risk to say the least. I will try to call the patient's daughters later this afternoon to discuss what surgical intervention would entail along with the possible risk  [name], MD, FACS General, Bariatric, & Minimally Invasive Surgery [facility], [place]   LOS: 4 days    Brandi Heath, Brandi Heath 12/04/2015

## 2015-12-04 NOTE — Progress Notes (Signed)
PROGRESS NOTE                                                                                                                                                                                                             Patient Demographics:    Brandi Heath, is a 82 y.o. female, DOB - [birth year], ZOX:[id number]  Admit date - 11/30/2015   Admitting Physician Therisa Doyne, MD  Outpatient Primary MD for the patient is Merrilee Seashore, MD  LOS - 4  Chief Complaint  Patient presents with  . Emesis       Brief Narrative   82 y.o. female with medical history significant of  CAD, dementia, DVT, OSA, D, atrial fibrillation on anticoagulation, recurrent small bowel obstruction, CHF nonischemic cardiomyopathy with mitral regurgitation, seizure disorder, recent stroke, As with abdominal pain, vomiting and altered mental status. Workup significant for small bowel obstruction due to adhesion per CT report, A. fib with RVR, UTI , AKI and elevated troponins , Patient remains nothing by mouth, requiring Cardizem drip for heart rate control, heparin GTT for anticoagulation secondary to A. fib in the setting of recent CVA, still with significant NG output secondary to his by mouth.   Subjective:    Brandi Heath today more awake, Plain of pain in lower back, and left ankle , no significant NG output .    Assessment  & Plan :    Active Problems:   Dementia with behavioral disturbance   Hypertensive heart disease with CHF (congestive heart failure) (HCC)   Atrial fibrillation with rapid ventricular response (HCC)   Acute encephalopathy   UTI (urinary tract infection)   SBO (small bowel obstruction)   Atrial fibrillation with RVR (HCC)   Acute renal failure (ARF) (HCC)   Dehydration   Pressure injury of skin   Protein-calorie malnutrition, severe  SBO - Patient with known history of recurrent SBO, most recent admission in 10/31/2015, improved with  conservative management. - Still with significant NGT suction/output,X-ray still showing persistent SBO (4,800 cc out put over last 24 hours), required multiple fluid boluses to keep up with output. - Surgical consult appreciated, very poor operative candidate, moderate to high risk, continue conservative management,  - started TPN 11/20 per surgery recommendation  - Patient with multiple electrolytes abnormalities due to her high NGT output, monitor closely and replete -  Discussed with daughter regarding goals of care and no improvement , it seems they want to consider surgery if it's the only option, will consult palliative medicine to help discuss goals of care.  A. fib with RVR - CHADS2vasc score of 8, on Eluiquis. currently on hold given his renal failure and patient is nothing by mouth. Currently On heparin GTT given recent history of CVA -  nothing by mouth, can't give her home dose diltiazem and metoprolol, heart rate was controlled on amiodarone drip, but poorly controlled once back on metoprolol scheduled dose, so will start on Cardizem drip today. - Had minimal bleeding around the PICC site today, so will hold heparin GTT for 2 hours.  Elevated troponins - Most likely in the setting of demand ischemia from atrial fibrillation, soft blood pressure, and renal function, troponin trend non-ACS pattern  Acute encephalopathy - Metabolic, most likely in the setting of severe dehydration, UTI, and acute renal failure. Continues to improve gradually  UTI - Treated with levofloxacin, urine culture growing Proteus, resistant to quinolones, levofloxacin, she has significantly improved, afebrile, no leukocytosis, so no need for further antibiotics  Acute renal failure - Baseline creatinine 0.8, was 3.4 on admission - due to volume depletion from nausea, vomiting secondary to SBO, and proving with IV fluids, hold Lasix and nephrotoxic medication  Dementia - Resume donepezil when able to  tolerate oral  History of seizures - Continue with IV Keppra as an able to take oral.  Chronic systolic CHF - Last EF of 40-45% on 10/06/2015. Patient on a beta blocker. No ACEi or ARB patient currently in acute renal failure will hold Lasix due to dehydration. Monitor closely as on IV fluids  Hypokalemia/hypophosphatemia - Secondary to significant NGT output, will replete.  Hypernatremia - Secondary to volume depletion, received multiple D5 half-normal boluses every day , will increase D5W. will give D5 half-normal saline fluid bolus today.  Severe protein calorie malnutrition - Continue with TPN  Elevated bicarbonate - This is most likely to significant acid loss from NG output , so start on acid reducing agent Pepcid and Protonix, will check a VBG, if uncompensated alkalosis will give acetazolamide.  Code Status : DNR  Family Communication  : D/W daughter  11/21  Disposition Plan  : remains in step down.  Consults  :  Gen Surgery,  Procedures  : None  DVT Prophylaxis  :  On Heparin GTT  Lab Results  Component Value Date   PLT 220 12/04/2015    Antibiotics  :   Anti-infectives    Start     Dose/Rate Route Frequency Ordered Stop   12/03/15 0830  fluconazole (DIFLUCAN) tablet 150 mg     150 mg Oral  Once 12/03/15 0828 12/03/15 0859   12/02/15 2200  levofloxacin (LEVAQUIN) IVPB 500 mg  Status:  Discontinued     500 mg 100 mL/hr over 60 Minutes Intravenous Every 48 hours 11/30/15 2202 12/03/15 1036   11/30/15 2230  aztreonam (AZACTAM) 500 mg in dextrose 5 % 50 mL IVPB  Status:  Discontinued     500 mg 100 mL/hr over 30 Minutes Intravenous Every 8 hours 11/30/15 2203 12/01/15 0732   11/30/15 2145  levofloxacin (LEVAQUIN) IVPB 750 mg     750 mg 100 mL/hr over 90 Minutes Intravenous  Once 11/30/15 2130 11/30/15 2327   11/30/15 2145  aztreonam (AZACTAM) 2 g in dextrose 5 % 50 mL IVPB  Status:  Discontinued     2 g 100 mL/hr  over 30 Minutes Intravenous  Once 11/30/15  2130 11/30/15 2203        Objective:   Vitals:   12/04/15 0400 12/04/15 0500 12/04/15 0600 12/04/15 0740  BP: 106/60 117/73 134/64   Pulse: (!) 55  (!) 31   Resp: 16 17 (!) 21   Temp: 99.3 F (37.4 C)   98 F (36.7 C)  TempSrc: Core (Comment)   Oral  SpO2: 99% 99% 96%   Weight:      Height:        Wt Readings from Last 3 Encounters:  12/01/15 54.2 kg (119 lb 7.8 oz)  11/14/15 66.2 kg (146 lb)  11/14/15 72.6 kg (160 lb)     Intake/Output Summary (Last 24 hours) at 12/04/15 1104 Last data filed at 12/04/15 0600  Gross per 24 hour  Intake           2144.5 ml  Output             6275 ml  Net          -4130.5 ml     Physical Exam  She is more awake and communicative today . Supple Neck,No JVD, Symmetrical Chest wall movement, Good air movement bilaterally, CTAB Irregular irregular, tachycardic,No Gallops,Rubs , + murmur, No Parasternal Heave +ve B.Sounds, Abd Soft, No tenderness, No Cyanosis, Clubbing or edema, No new Rash or bruise      Data Review:    CBC  Recent Labs Lab 11/30/15 1838 12/01/15 0316 12/02/15 0621 12/03/15 0645 12/04/15 0516  WBC 10.4 9.8 8.8 9.4 11.1*  HGB 14.3 11.7* 11.7* 11.1* 11.6*  HCT 41.5 35.0* 36.0 34.5* 35.8*  PLT 279 207 220 223 220  MCV 83.5 83.7 85.7 87.3 86.9  MCH 28.8 28.0 27.9 28.1 28.2  MCHC 34.5 33.4 32.5 32.2 32.4  RDW 13.6 13.5 14.0 14.3 14.2  LYMPHSABS 1.2  --   --   --   --   MONOABS 0.8  --   --   --   --   EOSABS 0.0  --   --   --   --   BASOSABS 0.0  --   --   --   --     Chemistries   Recent Labs Lab 11/30/15 1838 12/01/15 0316 12/02/15 0618 12/02/15 0621 12/03/15 0645 12/04/15 0516  NA 143 146*  --  149* 153* 151*  K 4.2 3.7  --  2.6* 2.9* 3.2*  CL 92* 100*  --  103 99* 86*  CO2 34* 33*  --  34* 46* >50*  GLUCOSE 176* 96  --  101* 163* 162*  BUN 60* 56*  --  45* 35* 29*  CREATININE 3.40* 2.90*  --  2.00* 1.60* 1.60*  CALCIUM 10.6* 9.0  --  9.3 9.4 9.7  MG  --  1.9 1.9  --  2.4 1.9  AST 24  22  --   --  18  --   ALT 16 13*  --   --  12*  --   ALKPHOS 86 62  --   --  59  --   BILITOT 0.8 0.7  --   --  0.4  --    ------------------------------------------------------------------------------------------------------------------  Recent Labs  12/04/15 0516  TRIG 88    Lab Results  Component Value Date   HGBA1C 5.9 (H) 12/03/2015   ------------------------------------------------------------------------------------------------------------------ No results for input(s): TSH, T4TOTAL, T3FREE, THYROIDAB in the last 72 hours.  Invalid input(s): FREET3 ------------------------------------------------------------------------------------------------------------------ No results for  input(s): VITAMINB12, FOLATE, FERRITIN, TIBC, IRON, RETICCTPCT in the last 72 hours.  Coagulation profile  Recent Labs Lab 11/30/15 1838  INR 1.47    No results for input(s): DDIMER in the last 72 hours.  Cardiac Enzymes  Recent Labs Lab 12/01/15 0316 12/01/15 0755 12/02/15 0621  TROPONINI 0.10* 0.12* 0.07*   ------------------------------------------------------------------------------------------------------------------ No results found for: BNP  Inpatient Medications  Scheduled Meds: . chlorhexidine  15 mL Mouth Rinse BID  . insulin aspart  0-9 Units Subcutaneous Q4H  . levETIRAcetam  750 mg Intravenous Q12H  . mouth rinse  15 mL Mouth Rinse q12n4p  . metoprolol  5 mg Intravenous Q6H  . pantoprazole (PROTONIX) IV  40 mg Intravenous Q12H  . sodium chloride 0.45 % with kcl   Intravenous Once  . sodium chloride  2,000 mL Intravenous Once  . sodium chloride flush  10-40 mL Intracatheter Q12H  . sodium chloride flush  3 mL Intravenous Q12H   Continuous Infusions: . dextrose 5 % with kcl 125 mL/hr at 12/04/15 0904  . diltiazem (CARDIZEM) infusion 5 mg/hr (12/04/15 0904)  . fat emulsion 192 mL (12/04/15 0600)   And  . TPN (CLINIMIX) Adult without lytes 40 mL/hr at 12/04/15 0600   . TPN (CLINIMIX) Adult without lytes     And  . fat emulsion    . heparin 700 Units/hr (12/04/15 0600)   PRN Meds:.acetaminophen **OR** acetaminophen, ondansetron **OR** ondansetron (ZOFRAN) IV, sodium chloride flush  Micro Results Recent Results (from the past 240 hour(s))  Urine culture     Status: Abnormal   Collection Time: 11/30/15  7:59 PM  Result Value Ref Range Status   Specimen Description URINE, CATHETERIZED  Final   Special Requests NONE  Final   Culture 40,000 COLONIES/mL PROTEUS MIRABILIS (A)  Final   Report Status 12/03/2015 FINAL  Final   Organism ID, Bacteria PROTEUS MIRABILIS (A)  Final      Susceptibility   Proteus mirabilis - MIC*    AMPICILLIN >=32 RESISTANT Resistant     CEFAZOLIN <=4 SENSITIVE Sensitive     CEFTRIAXONE <=1 SENSITIVE Sensitive     CIPROFLOXACIN >=4 RESISTANT Resistant     GENTAMICIN <=1 SENSITIVE Sensitive     IMIPENEM 2 SENSITIVE Sensitive     NITROFURANTOIN 256 RESISTANT Resistant     TRIMETH/SULFA >=320 RESISTANT Resistant     AMPICILLIN/SULBACTAM 16 INTERMEDIATE Intermediate     PIP/TAZO <=4 SENSITIVE Sensitive     * 40,000 COLONIES/mL PROTEUS MIRABILIS  MRSA PCR Screening     Status: None   Collection Time: 11/30/15 11:46 PM  Result Value Ref Range Status   MRSA by PCR NEGATIVE NEGATIVE Final    Comment:        The GeneXpert MRSA Assay (FDA approved for NASAL specimens only), is one component of a comprehensive MRSA colonization surveillance program. It is not intended to diagnose MRSA infection nor to guide or monitor treatment for MRSA infections.     Radiology Reports Ct Abdomen Pelvis Wo Contrast  Result Date: 11/30/2015 CLINICAL DATA:  Vomiting. Generalized abdominal pain and distention. EXAM: CT ABDOMEN AND PELVIS WITHOUT CONTRAST TECHNIQUE: Multidetector CT imaging of the abdomen and pelvis was performed following the standard protocol without IV contrast. COMPARISON:  10/01/2015 FINDINGS: Lower chest: There is  cardiomegaly. Chronic opacities, likely scarring in the lung bases. No effusions. Densely calcified coronary arteries noted. There is a moderate-sized hiatal hernia which is fluid-filled. Hepatobiliary: Prior cholecystectomy.  No focal hepatic abnormality. Pancreas: Diffuse atrophy of  the pancreas without visualized focal abnormality or ductal dilatation. Spleen: No focal abnormality.  Normal size. Adrenals/Urinary Tract: Small cyst in the midpole of the right kidney. No hydronephrosis. Adrenal gland is unremarkable. Urinary bladder decompressed. Stomach/Bowel: The stomach is markedly distended with fluid. There are dilated small bowel loops in the right abdomen and extending into the pelvis. Transition point is in the mid pelvis on image 64, presumably related to adhesions. Findings compatible with distal small bowel obstruction. Colon is decompressed, grossly unremarkable. Vascular/Lymphatic: Prior stent graft repair of abdominal aortic aneurysm. The aneurysm sac size is 5.1 cm compared with 5.8 cm previously. No adenopathy. Reproductive: Prior hysterectomy.  No adnexal masses. Other: No free fluid or free air. Musculoskeletal: No acute bony abnormality or focal bone lesion. Diffuse degenerative disc and facet disease in the lumbar spine. IMPRESSION: Distal small bowel obstruction, likely related to adhesions. Moderate-sized fluid-filled hiatal hernia. Coronary artery disease, aortic atherosclerosis. Prior stent graft repair of AAA. Decreasing aneurysm sac size since prior study. Electronically Signed   By: Charlett Nose M.D.   On: 11/30/2015 20:25   Dg Chest 2 View  Result Date: 11/14/2015 CLINICAL DATA:  Altered mental status. EXAM: CHEST  2 VIEW COMPARISON:  Chest radiograph 10/28/2015 FINDINGS: Cardiomediastinal contours are unchanged. Diffusely increased pulmonary markings remain. There is no focal airspace consolidation. No pneumothorax or sizable pleural effusion. IMPRESSION: 1. No radiographic evidence  of pneumonia. 2. Unchanged findings of chronic lung disease with diffusely increased pulmonary markings and bibasilar no nodular opacities. Electronically Signed   By: Deatra Robinson M.D.   On: 11/14/2015 14:16   Dg Abd 1 View  Result Date: 12/01/2015 CLINICAL DATA:  Small bowel obstruction EXAM: ABDOMEN - 1 VIEW COMPARISON:  11/30/2015 FINDINGS: Dilated small bowel loops again noted right abdomen and pelvis compatible with small bowel obstruction, unchanged. NG tube remains in the stomach. Stent graft again noted. IVC filter noted. No free air or visible organomegaly. IMPRESSION: Continued small bowel dilatation compatible with small bowel obstruction. No real change. Electronically Signed   By: Charlett Nose M.D.   On: 12/01/2015 07:09   Ct Head Wo Contrast  Result Date: 11/14/2015 CLINICAL DATA:  Altered mental status/ lethargy.  Left arm weakness EXAM: CT HEAD WITHOUT CONTRAST TECHNIQUE: Contiguous axial images were obtained from the base of the skull through the vertex without intravenous contrast. COMPARISON:  Brain MRI October 03, 2015 FINDINGS: Brain: There is moderate diffuse atrophy, unchanged. Note that there is fairly severe cerebellar atrophy, likewise stable. There is no intracranial mass, hemorrhage, extra-axial fluid collection, or midline shift. There is patchy small vessel disease in the centra semiovale bilaterally. There is no new gray-white compartment lesion. No acute infarct evident. Vascular: There is no demonstrable hyperdense vessel. There is calcification in the carotid siphon regions bilaterally. Skull: The bony calvarium appears intact. Sinuses/Orbits: Visualized paranasal sinuses are clear. Orbits appear symmetric bilaterally. Other: Mastoid air cells are clear. IMPRESSION: Diffuse atrophy, stable. Patchy periventricular small vessel disease, stable. No acute infarct is demonstrable on this study. No hemorrhage, mass, or extra-axial fluid collection. Carotid siphon region  vascular calcification evident bilaterally. Electronically Signed   By: Bretta Bang III M.D.   On: 11/14/2015 13:54   Mr Brain Wo Contrast  Result Date: 11/14/2015 CLINICAL DATA:  Altered mental status and lethargy EXAM: MRI HEAD WITHOUT CONTRAST TECHNIQUE: Multiplanar, multiecho pulse sequences of the brain and surrounding structures were obtained without intravenous contrast. COMPARISON:  Head CT 11/14/2015, brain MRI 10/03/2015 FINDINGS: Brain: No acute  infarct or intraparenchymal hemorrhage. The midline structures are normal. There is beginning confluent hyperintense T2-weighted signal within the periventricular white matter, most often seen in the setting of chronic microvascular ischemia. There is diffuse atrophy with mild parietal predominance. No mass lesion or midline shift. No hydrocephalus or extra-axial fluid collection. Vascular: Major intracranial arterial and venous sinus flow voids are preserved. Chronic hemosiderin deposition within the right caudate body. Skull and upper cervical spine: The visualized skull base, calvarium, upper cervical spine and extracranial soft tissues are normal. Sinuses/Orbits: No fluid levels or advanced mucosal thickening. No mastoid effusion. Normal orbits. IMPRESSION: 1. No acute intracranial abnormality. 2. Findings of chronic microvascular disease and diffuse atrophy, with mild parietal predominance. Electronically Signed   By: Deatra Robinson M.D.   On: 11/14/2015 19:53   Dg Chest Port 1 View  Result Date: 12/02/2015 CLINICAL DATA:  Post PICC line insertion EXAM: PORTABLE CHEST 1 VIEW COMPARISON:  November 14, 2015 FINDINGS: The heart, hila, and mediastinum are unchanged. The right PICC line terminates near the cavoatrial junction. The distal tip of the NG tube is not assessed. There is tubing coiled back on itself projected over the midline of the upper chest, likely the portion of the NG tube outside dictation. High attenuation material in the lung bases  is stable, possibly aspirated material. No other interval changes or acute abnormalities. IMPRESSION: 1. The right PICC line terminates near the caval atrial junction. Electronically Signed   By: Gerome Sam III M.D   On: 12/02/2015 18:49   Dg Abd Portable 1v  Result Date: 12/02/2015 CLINICAL DATA:  Small bowel obstruction. EXAM: PORTABLE ABDOMEN - 1 VIEW COMPARISON:  December 01, 2015 FINDINGS: A small bowel obstruction persists. No contrast is identified on today's study. However, only minimal contrast is seen in stomach previously. NG tube terminates in the left upper quadrant. IMPRESSION: Persistent small bowel obstruction. Electronically Signed   By: Gerome Sam III M.D   On: 12/02/2015 16:01   Dg Abd Portable 1v-small Bowel Obstruction Protocol-initial, 8 Hr Delay  Result Date: 12/01/2015 CLINICAL DATA:  Small bowel obstruction. EXAM: PORTABLE ABDOMEN - 1 VIEW COMPARISON:  December 01, 2015 FINDINGS: The NG tube is stable. A small amount of contrast is seen in the stomach, new in the interval. No other contrast seen throughout the abdomen. Persistently dilated small bowel loops in the right abdomen consistent with small bowel obstruction. No other change. IMPRESSION: Small bowel obstruction. Electronically Signed   By: Gerome Sam III M.D   On: 12/01/2015 22:49   Dg Abd Portable 1v-small Bowel Protocol-position Verification  Result Date: 12/01/2015 CLINICAL DATA:  Followup small bowel obstruction. Nasogastric tube placement. EXAM: PORTABLE ABDOMEN - 1 VIEW COMPARISON:  12/01/2015 FINDINGS: Nasogastric tube is seen with tip in the body the stomach. Moderately dilated small bowel loops in the right abdomen show no significant change compared to recent study. Aortoiliac stent graft again seen as well as embolization coils adjacent to the right iliac limb of the graft. IVC filter again noted. IMPRESSION: Nasogastric tube tip remains in the body the stomach. No significant change in  dilated small bowel loops, consistent with small bowel obstruction. Electronically Signed   By: Myles Rosenthal M.D.   On: 12/01/2015 12:49   Dg Abd Portable 1v  Result Date: 11/30/2015 CLINICAL DATA:  Nasogastric tube placement EXAM: PORTABLE ABDOMEN - 1 VIEW COMPARISON:  11/30/2015 CT FINDINGS: The nasogastric tube extends well into the stomach with tip in the region of the distal gastric  body. IMPRESSION: Nasogastric tube extends into the stomach. Electronically Signed   By: Ellery Plunk M.D.   On: 11/30/2015 22:55    Time Spent in minutes  35 minutes   Robina Hamor M.D on 12/04/2015 at 11:04 AM  Between 7am to 7pm - Pager - [phone]  After 7pm go to [email or web address] - password [facility]  [facility] -  Office  [phone]

## 2015-12-04 NOTE — Progress Notes (Addendum)
PHARMACY - ADULT TOTAL PARENTERAL NUTRITION CONSULT NOTE   Pharmacy Consult for TPN Indication: Recurrent SBO  Patient Measurements: Height: 5\' 9"  (175.3 cm) Weight: 119 lb 7.8 oz (54.2 kg) IBW/kg (Calculated) : 66.2 TPN AdjBW (KG): 54.2 Body mass index is 17.65 kg/m. Usual Weight: 77 kg  Insulin Requirements: 1 unit Novolog yesterday (started TPN at 6p)  Current Nutrition: NPO, D5 in IVF  IVF: D5 + 30 KCl @ 125 ml/hr  Central access: PICC 11/19 TPN start date: 12/03/15   ASSESSMENT                                                                                                          HPI: 82 y.o. female with medical history significant of  CAD, dementia, DVT/PE, OSA, AFib on anticoagulation, recurrent SBO, CHF, NICM with mitral regurgitation, seizure disorder, recent stroke; presented with N/V, abd pain, and AMS. Recently admitted for SBO in October which improved with conservative mgmt. NGT currently with high output and remains NPO  Significant events:  11/20: patient signalling for water; appears to be thirsty  Today:    Glucose - No Hx DM; some elevated readings (120-177)  Electrolytes - Consistent with massive gastric fluid losses: bicarb very high and Cl low d/t HCl losses; Na elevated but improved somewhat with change from NS to 1/2-NS; K minimally improved after giving 180 mEq yesterday. Ca wnl and phos improved to wnl after repletion. No recent blood pH but expect patient is alkalotic with bicarb > 50  Renal - SCr elevated but much improved; CrCl 22 ml/min; no further improvement today  I/O - remains net negative from worsening NGT losses and now improved UOP  LFTs - albumin sl low; o/w normal  TGs - wnl  Prealbumin - slightly low  NUTRITIONAL GOALS                                                                                             RD recs: Kcal:  1500-1700 Protein:  70-80g Fluid:  1.5-1.7L/day  Clinimix 5/15 at a goal rate of 70 ml/hr + 20% fat  emulsion at 10 ml/hr to provide: 84 g/day protein, 1672 Kcal/day.  PLAN  Discussed electrolyte management again at length with MD  IVF + 30 K rate increased by MD  Will repeat 1/2 NS with 80 mEq potassium bolus x 1  MD ordering an additional 2L in 1/2 NS boluses  Consider consolidating IVFs tomorrow as dextrose now in TPN  1/2 NS with 30 K at 150 ml/hr, plus  1/2 NS with 80 K x 1 L bolus  This will provide 188 mEq K and 6.3 L fluid/day (including TPN at goal rate), which is roughly what patient has been requiring d/t fluid losses  Ensure Na level does not decrease too rapidly (< 8 mEq per 24 hr)  F/u bicarb level; acid suppression therapy and correction of hypokalemia should help, otherwise more aggressive measures may be needed. Would avoid carbonic anhydrase inhibitors as these can worsen hypokalemia.  At 1800 today:  Continue Clinimix 5/15 plain; advance to goal rate of 70 ml/hr.  Increase 20% fat emulsion to 10 ml/hr.  TPN to contain standard multivitamins and trace elements; will add Pepcid 20 mg/day (reduced for renal impairment) to minimize H+ losses to stomach fluid. Also adding Protonix IV BID per MD for same reason.  IVFs per MD, see comments above  Sens SSI with q4 CBG checks  BMP, Mg, Phos tomorrow AM  TPN lab panels on Mondays & Thursdays  F/u daily.  Bernadene Person, PharmD, BCPS Pager: [phone] 12/04/2015, 7:45 AM

## 2015-12-04 NOTE — Progress Notes (Addendum)
Nutrition Follow-up  DOCUMENTATION CODES:   Severe malnutrition in context of acute illness/injury, Underweight  INTERVENTION:  - Continue TPN per pharmacy. - RD will follow-up 11/22.  NUTRITION DIAGNOSIS:   Inadequate oral intake related to inability to eat as evidenced by NPO status. -ongoing  GOAL:   Patient will meet greater than or equal to 90% of their needs -unmet for protein with current TPN rate, kcal need being met by TPN + IVF   MONITOR:   Weight trends, Labs, Skin, I & O's, Other (Comment) (TPN regimen)  ASSESSMENT:   82 y.o. female with medical history significant of  CAD, dementia, DVT, OSA, D, atrial fibrillation on anticoagulation, recurrent small bowel obstruction, CHF nonischemic cardiomyopathy with mitral regurgitation, seizure disorder, recent stroke, As with abdominal pain, vomiting and altered mental status.  11/21 Pt remains NPO. Pt with dementia and no family/visitors at bedside. Spoke with RN who reports NGT output of 2L overnight; canister currently with ~150cc and RN reports that this has been over the past 5 hours. Pt with SBO. Per MD note this AM, Surgery has stated that pt is a very poor surgical candidate. Also pt with acute encephalopathy which MD note states is likely "metabolic in the setting of severe dehydration, UTI, and ARF."  Pt currently receiving Clinimix E 5/15 @ 40 mL/hr with 20% ILE @ 8 mL/hr. This regimen is providing 48 grams of protein (68.5% minimum estimated protein need) and 1066 kcal (71% minimum estimated kcal need). Spoke with Pharmacist this AM about altered lytes and relationship with high NGT output, repletion ordered. See Pharmacy note from today at [facility] related to TPN, electrolytes, and IVF recommendations.   Goal TPN (to start tonight): Clinimix E 5/15 @ 70 mL/hr with 20% ILE @ 10 mL/hr which will provide 84 grams of protein (105% maximum estimated protein need) and 1673 kcal.   At time of RD visit, BP: 81/48 and MAP:  58.  Medications reviewed; sliding scale Novolog, 750 mg IV Keppra BID, PRN Zofran, 40 mg IV Protonix BID, 15 mmol IV KPhos x1 dose yesterday Labs reviewed; CBGs: 164 and 177 mg/dL this AM, Na: 151 mmol/L, K: 3.2 mmol/L, Cl: 86 mmol/L, BUN: 29 mg/dL, creatinine: 1.6 mg/dL, GFR: 33 mL/min, Mg and Phos WDL.   IVF: D5-30 mEq KCl @ 125 mL/hr (612 kcal).    11/20 - Spoke with Pharmacist this AM who reports TPN to begin today at 1800.  - Pt remains NPO with NGT in place and per Surgeon note this AM, 3.4L output overnight. - Pharmacist reports pt -2L since admission and repletion of K ongoing d/t high losses secondary to gastric decompression.    11/19 - Patient in room with no family at bedside.  - Pt unable to provide any nutritional history d/t dementia. - Per chart review, pt admitted with difficulty swallowing and SBO. - Currently NPO for bowel rest. - NGT in place, output: 900 ml.  - Reported 10 lb weight loss over the past week.  - Per chart review, pt has lost 27 lb since 11/1 (18% wt loss x 18 days, significant for time frame).  - Nutrition-Focused physical exam completed. Findings are moderate fat depletion, severe muscle depletion. - Per previous medication history, pt was receiving Ensure supplements.    Diet Order:  Diet NPO time specified TPN (CLINIMIX) Adult without lytes TPN (CLINIMIX) Adult without lytes  Skin:  Wound (see comment) (Unstageable, full thickness sacral pressure injury)  Last BM:  11/17  Height:  Ht Readings from Last 1 Encounters:  12/01/15 5' 9"  (1.753 m)    Weight:   Wt Readings from Last 1 Encounters:  12/01/15 119 lb 7.8 oz (54.2 kg)    Ideal Body Weight:  65.9 kg  BMI:  Body mass index is 17.65 kg/m.  Estimated Nutritional Needs:   Kcal:  1500-1700  Protein:  70-80g  Fluid:  1.5-1.7L/day  EDUCATION NEEDS:   No education needs identified at this time    Jarome Matin, MS, RD, LDN, CNSC Inpatient Clinical  Dietitian Pager # [phone] After hours/weekend pager # [phone]

## 2015-12-04 NOTE — Consult Note (Signed)
Consultation Note Date: 12/04/2015   Patient Name: Brandi Heath  DOB: [birth year]  MRN: [id number]  Age / Sex: 82 y.o., female  PCP: Margit Hanks, MD Referring Physician: Starleen Arms, MD  Reason for Consultation: Establishing goals of care  HPI/Patient Profile: 82 y.o. 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  Brandi Heath has had 4 admits in the last 6 months.  She has been treated conservatively with bowel rest and an NG tube.  On admission she had acute kidney injury.  Her baseline creatinine is normally 1.0, and is now 1.6.  Her pulse and blood pressure appear labile.  Clinical Assessment and Goals of Care: When examining Brandi Heath she would nod or shake her head in response to my questions.  She did not speak.  When I asked if she would consider having surgery, she shook her head "no".  I spoke on the phone with her daughter Lynden Oxford. Tammy tells me that Brandi Heath had 7 dtrs and 2 sons - the sons have both passed.  The 7 daughters make medical decisions for their mother as a group.  Tammy and I plan to meet with all of the sisters on Thursday (Thanksgiving at 2:00 pm).  Primary Decision Maker:  NEXT OF KIN (7 dtrs)    SUMMARY OF RECOMMENDATIONS    Will meet with daughters and extended family on Thursday at 2:00 pm.  Code Status/Advance Care Planning:  DNR    Symptom Management:   Per primary team  Palliative Prophylaxis:   Frequent Pain Assessment  Additional Recommendations (Limitations, Scope, Preferences):  Full Scope Treatment  Prognosis:   Likely 3 months or less if the bowel obstruction does not clear.    Discharge Planning: To Be Determined      Primary Diagnoses: Present on  Admission: . Acute encephalopathy . Atrial fibrillation with rapid ventricular response (HCC) . Dementia with behavioral disturbance . Hypertensive heart disease with CHF (congestive heart failure) (HCC) . SBO (small bowel obstruction) . UTI (urinary tract infection) . Acute renal failure (ARF) (HCC) . Dehydration . Atrial fibrillation with RVR (HCC)   I have reviewed the medical record, interviewed the patient and family, and examined the patient. The following aspects are pertinent.  Past Medical History:  Diagnosis Date  . CHF (congestive heart failure) (HCC)   . Clotting disorder (HCC)   . Coronary artery disease   . Dementia without behavioral disturbance   . DVT (deep venous thrombosis) (HCC)   . Hyperlipidemia   . Hypertension   . OSA (obstructive sleep apnea)   . PE (pulmonary embolism)   . Persistent atrial fibrillation (HCC)   . Polyneuropathy (HCC)   . SBO (small bowel obstruction)   . Seizures (HCC)   . Stroke [facility])    Social History   Social History  . Marital status: Widowed  Spouse name: N/A  . Number of children: N/A  . Years of education: N/A   Social History Main Topics  . Smoking status: Never Smoker  . Smokeless tobacco: Never Used  . Alcohol use No  . Drug use: Unknown  . Sexual activity: No   Other Topics Concern  . None   Social History Narrative  . None   Family History  Problem Relation Age of Onset  . Hypertension Mother   . Heart disease Mother   . Heart disease Father   . Cancer Sister     breast  . Diabetes Sister   . Heart disease Son   . Hypertension Son   . Thyroid disease Sister    Scheduled Meds: . chlorhexidine  15 mL Mouth Rinse BID  . insulin aspart  0-9 Units Subcutaneous Q4H  . levETIRAcetam  750 mg Intravenous Q12H  . mouth rinse  15 mL Mouth Rinse q12n4p  . metoprolol  5 mg Intravenous Q6H  . pantoprazole (PROTONIX) IV  40 mg Intravenous Q12H  . sodium chloride flush  10-40 mL Intracatheter Q12H  .  sodium chloride flush  3 mL Intravenous Q12H   Continuous Infusions: . dextrose 5 % with kcl 125 mL/hr at 12/04/15 1400  . diltiazem (CARDIZEM) infusion 10 mg/hr (12/04/15 1400)  . fat emulsion 192 mL (12/04/15 1400)   And  . TPN (CLINIMIX) Adult without lytes 40 mL/hr at 12/04/15 1400  . TPN (CLINIMIX) Adult without lytes     And  . fat emulsion    . heparin 700 Units/hr (12/04/15 1400)   PRN Meds:.acetaminophen **OR** acetaminophen, ondansetron **OR** ondansetron (ZOFRAN) IV, sodium chloride flush Allergies  Allergen Reactions  . Chocolate Diarrhea  . Lactose Intolerance (Gi) Diarrhea  . Penicillins Other (See Comments)    Reaction:  Unknown  Has patient had a PCN reaction causing immediate rash, facial/tongue/throat swelling, SOB or lightheadedness with hypotension: Unsure Has patient had a PCN reaction causing severe rash involving mucus membranes or skin necrosis: Unsure Has patient had a PCN reaction that required hospitalization:  Unsure  Has patient had a PCN reaction occurring within the last 10 years: Unsure If all of the above answers are "NO", then may proceed with Cephalosporin use.   Review of Systems patient does not speak  Physical Exam  Constitutional: She appears well-developed and well-nourished. No distress.  Mouth appears dry.  HENT:  Head: Normocephalic and atraumatic.  Eyes: Right eye exhibits no discharge. Left eye exhibits no discharge.  Cardiovascular:  irreg irreg with systolic murmur  Pulmonary/Chest: Effort normal.  On oxygen via nasal canula  Abdominal: Soft. She exhibits no distension. There is no tenderness. There is no guarding.  Decreased bowel sounds  Musculoskeletal: She exhibits no edema or deformity.  Neurological:  Awake.  Attempts to follow commands.  Very weak.  Does not speak.  Skin: Skin is warm and dry. No erythema.  Psychiatric:  Answers questions with a nod or shake of the head.  Does not speak.  Calm.  Does not appear  distressed or anxious.     Vital Signs: BP 107/67   Pulse (!) 35   Temp 98 F (36.7 C) (Oral)   Resp 14   Ht 5\' 9"  (1.753 m)   Wt 54.2 kg (119 lb 7.8 oz)   SpO2 97%   BMI 17.65 kg/m  Pain Assessment: No/denies pain   Pain Score: Asleep   SpO2: SpO2: 97 % O2 Device:SpO2: 97 % O2 Flow Rate: .O2  Flow Rate (L/min): 2 L/min  IO: Intake/output summary:  Intake/Output Summary (Last 24 hours) at 12/04/15 1618 Last data filed at 12/04/15 1400  Gross per 24 hour  Intake          4912.66 ml  Output             7225 ml  Net         -2312.34 ml    LBM: Last BM Date: 11/30/15 Baseline Weight: Weight: 66.2 kg (145 lb 15.1 oz) Most recent weight: Weight: 54.2 kg (119 lb 7.8 oz)     Palliative Assessment/Data:   Flowsheet Rows   Flowsheet Row Most Recent Value  Intake Tab  Referral Department  Hospitalist  Unit at Time of Referral  Intermediate Care Unit  Palliative Care Primary Diagnosis  Other (Comment)  Date Notified  12/04/15  Palliative Care Type  New Palliative care  Reason for referral  Clarify Goals of Care  Date of Admission  11/30/15  Date first seen by Palliative Care  12/04/15  # of days Palliative referral response time  0 Day(s)  # of days IP prior to Palliative referral  4  Clinical Assessment  Palliative Performance Scale Score  20%  Psychosocial & Spiritual Assessment  Palliative Care Outcomes      Time In: 3:20 Time Out: 4:30 Time Total: 70 min. Greater than 50%  of this time was spent counseling and coordinating care related to the above assessment and plan.  Signed by: Algis DownsMarianne York, PA-C Palliative Medicine Pager: [phone]  Please contact Palliative Medicine Team phone at [phone] for questions and concerns.  For individual provider: See Loretha StaplerAmion

## 2015-12-04 NOTE — Progress Notes (Addendum)
ANTICOAGULATION CONSULT NOTE - Follow Up Consult  Pharmacy Consult for Heparin Indication: atrial fibrillation  Patient Measurements: Height: 5\' 9"  (175.3 cm) Weight: 119 lb 7.8 oz (54.2 kg) IBW/kg (Calculated) : 66.2 Heparin Dosing Weight: actual weight  Medications:  Infusions:  . dextrose 5 % with kcl 100 mL/hr at 12/04/15 0654  . fat emulsion 192 mL (12/04/15 0600)   And  . TPN (CLINIMIX) Adult without lytes 40 mL/hr at 12/04/15 0600  . heparin 700 Units/hr (12/04/15 0600)    Assessment: 11 yoF admitted with UTI/sepsis. PMH significant for AFib and patient is on oral anticoagulation PTA with Eliquis, held for NPO status.  Pharmacy consulted to dose IV heparin for AFib, while Eliquis is on hold.  Today, 12/04/15  HL now therapeutic and appears to be correlating with aPTT as anti-Xa effects of Eliquis diminish  CBC stable with Hgb sl low; reasonable for age  CrCl low  No bleeding or infusion issues per RN  Goal of Therapy:  Heparin level 0.3-0.7 units/ml aPTT 66-102 seconds Monitor platelets by anticoagulation protocol: Yes   Plan:   Continue current IV heparin rate at 700 units/hr  Daily heparin level and CBC; may discontinue monitoring aPTT  Monitor for signs of bleeding or thrombosis   Bernadene Person, PharmD, BCPS Pager: [phone] 12/04/2015, 7:39 AM   +++++++++++++++++++++++++++++++++++++++ ADDENDUM  RN notes bleeding at infusion site  MD contacted, instructions to hold heparin x 2 hrs  Plan:  Agree with holding heparin  F/u for resolution of bleeding after heparin gtt resumed  Bernadene Person, PharmD, BCPS Pager: [phone] 12/04/2015, 9:08 AM

## 2015-12-04 NOTE — Progress Notes (Signed)
Date:  December 04, 2015 Chart reviewed for concurrent status and case management needs. Will continue to follow patient progress.  Iv Cardizem drip, amiodarone on hold,  Discharge Planning: following for needs Expected discharge date: [id number] Marcelle Smiling, BSN, [place], [place]   [phone]

## 2015-12-05 LAB — GLUCOSE, CAPILLARY
GLUCOSE-CAPILLARY: 124 mg/dL — AB (ref 65–99)
Glucose-Capillary: 142 mg/dL — ABNORMAL HIGH (ref 65–99)
Glucose-Capillary: 103 mg/dL — ABNORMAL HIGH (ref 65–99)
Glucose-Capillary: 133 mg/dL — ABNORMAL HIGH (ref 65–99)
Glucose-Capillary: 111 mg/dL — ABNORMAL HIGH (ref 65–99)
Glucose-Capillary: 117 mg/dL — ABNORMAL HIGH (ref 65–99)

## 2015-12-05 LAB — CBC
HEMATOCRIT: 29.1 % — AB (ref 36.0–46.0)
Hemoglobin: 9.8 g/dL — ABNORMAL LOW (ref 12.0–15.0)
MCH: 28.4 pg (ref 26.0–34.0)
MCHC: 33.7 g/dL (ref 30.0–36.0)
MCV: 84.3 fL (ref 78.0–100.0)
Platelets: 158 10*3/uL (ref 150–400)
RBC: 3.45 MIL/uL — AB (ref 3.87–5.11)
RDW: 14.9 % (ref 11.5–15.5)
WBC: 11.8 10*3/uL — AB (ref 4.0–10.5)

## 2015-12-05 LAB — BASIC METABOLIC PANEL
Anion gap: 8 (ref 5–15)
BUN: 32 mg/dL — ABNORMAL HIGH (ref 6–20)
CHLORIDE: 87 mmol/L — AB (ref 101–111)
CO2: 39 mmol/L — ABNORMAL HIGH (ref 22–32)
Calcium: 8.5 mg/dL — ABNORMAL LOW (ref 8.9–10.3)
Creatinine, Ser: 1.26 mg/dL — ABNORMAL HIGH (ref 0.44–1.00)
GFR calc non Af Amer: 37 mL/min — ABNORMAL LOW (ref >60)
GFR, EST AFRICAN AMERICAN: 43 mL/min — AB (ref >60)
Glucose, Bld: 151 mg/dL — ABNORMAL HIGH (ref 65–99)
POTASSIUM: 3.2 mmol/L — AB (ref 3.5–5.1)
SODIUM: 134 mmol/L — AB (ref 135–145)

## 2015-12-05 LAB — APTT: aPTT: 82 seconds — ABNORMAL HIGH (ref 24–36)

## 2015-12-05 LAB — PHOSPHORUS: PHOSPHORUS: 1.2 mg/dL — AB (ref 2.5–4.6)

## 2015-12-05 LAB — MAGNESIUM: MAGNESIUM: 1.3 mg/dL — AB (ref 1.7–2.4)

## 2015-12-05 LAB — HEPARIN LEVEL (UNFRACTIONATED): Heparin Unfractionated: 0.3 IU/mL (ref 0.30–0.70)

## 2015-12-05 MED ORDER — POTASSIUM PHOSPHATES 15 MMOLE/5ML IV SOLN
30.0000 mmol | Freq: Once | INTRAVENOUS | Status: AC
  Administered 2015-12-05: 30 mmol via INTRAVENOUS
  Filled 2015-12-05: qty 10

## 2015-12-05 MED ORDER — TRACE MINERALS CR-CU-MN-SE-ZN 10-1000-500-60 MCG/ML IV SOLN
INTRAVENOUS | Status: AC
  Administered 2015-12-05: 18:00:00 via INTRAVENOUS
  Filled 2015-12-05 (×2): qty 1680

## 2015-12-05 MED ORDER — POTASSIUM CHLORIDE 2 MEQ/ML IV SOLN
INTRAVENOUS | Status: DC
  Administered 2015-12-05 – 2015-12-08 (×7): via INTRAVENOUS
  Filled 2015-12-05 (×15): qty 1000

## 2015-12-05 MED ORDER — FAT EMULSION 20 % IV EMUL
240.0000 mL | INTRAVENOUS | Status: DC
  Administered 2015-12-05: 240 mL via INTRAVENOUS
  Filled 2015-12-05: qty 250

## 2015-12-05 MED ORDER — MAGNESIUM SULFATE 2 GM/50ML IV SOLN
2.0000 g | Freq: Once | INTRAVENOUS | Status: AC
  Administered 2015-12-05: 2 g via INTRAVENOUS
  Filled 2015-12-05: qty 50

## 2015-12-05 NOTE — Progress Notes (Signed)
Nutrition Follow-up  DOCUMENTATION CODES:   Severe malnutrition in context of acute illness/injury, Underweight  INTERVENTION:  - TPN per Pharmacy and MD. - RD will follow-up 11/24 for POC/GOC.  NUTRITION DIAGNOSIS:   Inadequate oral intake related to inability to eat as evidenced by NPO status. -ongoing  GOAL:   Patient will meet greater than or equal to 90% of their needs -met for protein, exceeding for kcal.   MONITOR:   Weight trends, Labs, Skin, I & O's, Other (Comment) (TPN regimen)  ASSESSMENT:   82 y.o. female with medical history significant of  CAD, dementia, DVT, OSA, D, atrial fibrillation on anticoagulation, recurrent small bowel obstruction, CHF nonischemic cardiomyopathy with mitral regurgitation, seizure disorder, recent stroke, As with abdominal pain, vomiting and altered mental status.  11/22 Pt with NGT with 300cc drainage present at time of RD visit earlier this AM. Pt remains NPO. Palliative Care following pt with last note today at 0753 which states that pt declining surgery; per notes, plan for [place] meeting with daughters tomorrow (11/23) at 1400. Palliative Care note this AM states pt likely to pass <2 weeks if SBO does not resolve. No new weight since 11/18.  Pt currently receiving goal rate TPN: Clinimix E 5/15 @ 70 mL/hr with 20% ILE @ 10 mL/hr which is providing 84 grams of protein (105% maximum estimated protein need) and 1673 kcal. This regimen is also providing 252 grams of dextrose. Talked with Pharmacist this AM and expressed concern about refeeding d/t quick increase to goal rate of TPN, current IVF regimen (D5-30 mEq KCl @ 125 mL/hr which is providing 510 kcal and 150 grams of dextrose), and severe malnutrition. Pharmacist spoke with MD after discussion with RD and plan at this time is for adjustment of IVF (outlined below) and continue current TPN regimen. Pt previously receiving 402 grams of dextrose/day and with change will now receive 372 grams of  dextrose/day.  If TPN to be decreased to Clinimix E 5/15 @ 40 mL/hr and continued order for new IVF regimen, she would receive 264 grams of dextrose/day.  Medications reviewed; sliding scale Novolog, 750 mg IV Keppra BID, 2 g IV Mg sulfate x1 dose today, PRN Zofran, 30 mmol KPhos x1 dose today. Labs reviewed; CBGs: 103 and 142 mg/dL, Na: 134 mmol/L, K: 3.2 mmol/L, Phos: 1.2 mg/dL, Mg: 1.3 mg/dL, Cl: 87 mmol/L, BUN: 32 mg/dL, creatinine: 1.26 mg/dL, Ca: 8.5 mg/dL, GFR: 43 mL/min.  IVF: D5-NS-60 mEq KCl @ 100 mL/hr (408 kcal).   11/21 - Pt remains NPO.  - Pt with dementia and no family/visitors at bedside.  - Spoke with RN who reports NGT output of 2L overnight; canister currently with ~150cc and RN reports that this has been over the past 5 hours.  - Pt with SBO. Per MD note this AM, Surgery has stated that pt is a very poor surgical candidate. - Also pt with acute encephalopathy which MD note states is likely "metabolic in the setting of severe dehydration, UTI, and ARF."  Pt currently receiving Clinimix E 5/15 @ 40 mL/hr with 20% ILE @ 8 mL/hr. This regimen is providing 48 grams of protein (68.5% minimum estimated protein need) and 1066 kcal (71% minimum estimated kcal need). Spoke with Pharmacist this AM about altered lytes and relationship with high NGT output, repletion ordered. See Pharmacy note from today at [facility] related to TPN, electrolytes, and IVF recommendations.   Goal TPN (to start tonight): Clinimix E 5/15 @ 70 mL/hr with 20% ILE @ 10  mL/hr which will provide 84 grams of protein (105% maximum estimated protein need) and 1673 kcal.   At time of RD visit, BP: 81/48 and MAP: 58. IVF: D5-30 mEq KCl @ 125 mL/hr (612 kcal).    11/20 - Spoke with Pharmacist this AM who reports TPN to begin today at 1800.  - Pt remains NPO with NGT in place and per Surgeon note this AM, 3.4L output overnight. - Pharmacist reports pt -2L since admission and repletion of K ongoing d/t high losses  secondary to gastric decompression.    Diet Order:  Diet NPO time specified TPN (CLINIMIX) Adult without lytes TPN (CLINIMIX) Adult without lytes  Skin:  Wound (see comment) (Unstageable, full thickness sacral pressure injury)  Last BM:  11/17  Height:   Ht Readings from Last 1 Encounters:  12/01/15 5' 9"  (1.753 m)    Weight:   Wt Readings from Last 1 Encounters:  12/01/15 119 lb 7.8 oz (54.2 kg)    Ideal Body Weight:  65.9 kg  BMI:  Body mass index is 17.65 kg/m.  Estimated Nutritional Needs:   Kcal:  1500-1700  Protein:  70-80g  Fluid:  1.5-1.7L/day  EDUCATION NEEDS:   No education needs identified at this time    Jarome Matin, MS, RD, LDN, CNSC Inpatient Clinical Dietitian Pager # [phone] After hours/weekend pager # [phone]

## 2015-12-05 NOTE — Progress Notes (Signed)
  Subjective: Patient seen and examined earlier this morning. Delayed note injury. Patient denies abdominal pain. Denies flatus. NG tube output about 2 L of brown liquid the past 24 hours  Objective: Vital signs in last 24 hours: Temp:  [97.5 F (36.4 C)-98.6 F (37 C)] 98.4 F (36.9 C) (11/22 1600) Resp:  [12-25] 17 (11/22 1800) BP: (76-129)/(46-113) 119/98 (11/22 1800) SpO2:  [74 %-100 %] 98 % (11/22 1800) Last BM Date: 11/30/15  Intake/Output from previous day: 11/21 0701 - 11/22 0700 In: 7274.2 [I.V.:4666.7; IV Piggyback:2607.5] Out: 3600 [Urine:1650; Emesis/NG output:1950] Intake/Output this shift: No intake/output data recorded.  Resting comfortably, nontoxic appearing, elderly, cachectic. Irregular heartbeat Soft, nondistended, nontender Oriented 3. She's tells me is 2017, November, and president trump  Lab Results:   Recent Labs  12/04/15 0516 12/05/15 0433  WBC 11.1* 11.8*  HGB 11.6* 9.8*  HCT 35.8* 29.1*  PLT 220 158   BMET  Recent Labs  12/04/15 0516 12/05/15 0433  NA 151* 134*  K 3.2* 3.2*  CL 86* 87*  CO2 >50* 39*  GLUCOSE 162* 151*  BUN 29* 32*  CREATININE 1.60* 1.26*  CALCIUM 9.7 8.5*   PT/INR No results for input(s): LABPROT, INR in the last 72 hours. ABG  Recent Labs  12/04/15 1110  HCO3 51.4*    Studies/Results: No results found.  Anti-infectives: Anti-infectives    Start     Dose/Rate Route Frequency Ordered Stop   12/03/15 0830  fluconazole (DIFLUCAN) tablet 150 mg     150 mg Oral  Once 12/03/15 0828 12/03/15 0859   12/02/15 2200  levofloxacin (LEVAQUIN) IVPB 500 mg  Status:  Discontinued     500 mg 100 mL/hr over 60 Minutes Intravenous Every 48 hours 11/30/15 2202 12/03/15 1036   11/30/15 2230  aztreonam (AZACTAM) 500 mg in dextrose 5 % 50 mL IVPB  Status:  Discontinued     500 mg 100 mL/hr over 30 Minutes Intravenous Every 8 hours 11/30/15 2203 12/01/15 0732   11/30/15 2145  levofloxacin (LEVAQUIN) IVPB 750 mg     750 mg 100 mL/hr over 90 Minutes Intravenous  Once 11/30/15 2130 11/30/15 2327   11/30/15 2145  aztreonam (AZACTAM) 2 g in dextrose 5 % 50 mL IVPB  Status:  Discontinued     2 g 100 mL/hr over 30 Minutes Intravenous  Once 11/30/15 2130 11/30/15 2203      Assessment/Plan: s/p * No surgery found * Pancreatitis - resolve SBO Dementia AKI Atrial fibrillation with RVR Cardiomyopathy H/o dvt/pe Recent TIA vs seizure  Persistent small bowel obstruction. No signs of worsening disease. White count stable at around 11. No fever. No sign of bowel function however NG tube output is down slightly.  Continue nonoperative management. Await palliative care meeting with family. Please see note from yesterday regarding additional thoughts  Brandi Heath. Andrey Campanile, MD, FACS General, Bariatric, & Minimally Invasive Surgery [facility], [place]   LOS: 5 days    Brandi Heath, Brandi Heath 12/05/2015

## 2015-12-05 NOTE — Progress Notes (Signed)
ANTICOAGULATION CONSULT NOTE - Follow Up Consult  Pharmacy Consult for Heparin Indication: atrial fibrillation  Patient Measurements: Height: 5\' 9"  (175.3 cm) Weight: 119 lb 7.8 oz (54.2 kg) IBW/kg (Calculated) : 66.2 Heparin Dosing Weight: actual weight  Medications:  Infusions:  . dextrose 5 % with kcl 125 mL/hr at 12/05/15 0138  . diltiazem (CARDIZEM) infusion Stopped (12/04/15 1900)  . TPN (CLINIMIX) Adult without lytes 70 mL/hr at 12/04/15 1724   And  . fat emulsion 240 mL (12/04/15 1900)  . heparin 700 Units/hr (12/04/15 1900)    Assessment: 71 yoF admitted with UTI/sepsis. PMH significant for AFib and patient is on oral anticoagulation PTA with Eliquis, held for NPO status.  Pharmacy consulted to dose IV heparin for AFib, while Eliquis is on hold.  Today, 12/05/15  HL remains therapeutic, but dropped to lower end of goal range   CBC- decrease in Hg, pltc today; reasonable for age  CrCl <93ml/min, but improving  No bleeding or infusion issues reported  Goal of Therapy:  Heparin level 0.3-0.7 units/ml aPTT 66-102 seconds Monitor platelets by anticoagulation protocol: Yes   Plan:   Increase IV heparin rate to 800 units/hr (to prevent sub-therapeutic levels)  Daily heparin level and CBC  Monitor for signs of bleeding or thrombosis  Junita Push, PharmD, BCPS Pager: [phone] 12/05/2015, 8:16 AM

## 2015-12-05 NOTE — Progress Notes (Signed)
PHARMACY - ADULT TOTAL PARENTERAL NUTRITION CONSULT NOTE   Pharmacy Consult for TPN Indication: Recurrent SBO  Patient Measurements: Height: 5\' 9"  (175.3 cm) Weight: 119 lb 7.8 oz (54.2 kg) IBW/kg (Calculated) : 66.2 TPN AdjBW (KG): 54.2 Body mass index is 17.65 kg/m. Usual Weight: 77 kg  Insulin Requirements: 7 units Novolog in past 24h hrs  Current Nutrition: NPO, D5 in IVF  IVF: D5 + 30 KCl @ 125 ml/hr  Central access: PICC 11/19 TPN start date: 12/03/15   ASSESSMENT                                                                                                          HPI: 82 y.o. female with medical history significant of  CAD, dementia, DVT/PE, OSA, AFib on anticoagulation, recurrent SBO, CHF, NICM with mitral regurgitation, seizure disorder, recent stroke; presented with N/V, abd pain, and AMS. Recently admitted for SBO in October which improved with conservative mgmt. NGT currently with high output and remains NPO  Significant events:  11/20: patient signalling for water; appears to be thirsty  Today:    Glucose - No Hx DM; CBGs at goal <150  Electrolytes - Consistent with massive gastric fluid losses: bicarb high and Cl low d/t HCl losses (both improving today); Na low with removal of saline from IVF; K, Phos, Mag are all low.  CorrCa wnl.   Renal - SCr elevated but much improved; CrCl ~28 ml/min;   I/O -improved/stable UOP  LFTs - albumin sl low; o/w normal  TGs - wnl  Prealbumin - slightly low  NUTRITIONAL GOALS                                                                                             RD recs: Kcal:  1500-1700 Protein:  70-80g Fluid:  1.5-1.7L/day  Clinimix 5/15 at a goal rate of 70 ml/hr + 20% fat emulsion at 10 ml/hr to provide: 84 g/day protein, 1672 Kcal/day.  PLAN                                                                                                                          Discussed electrolyte  management/IVF with  MD  Magnesium 2gm IV x1 now  KPhos 30mMol IV x1 (provides 40mEq K+ & 30mMol of of Phos)  Change IVF to D5NS + 60 K.  Decrease rate to 14000ml/hr.  At 1800 today:  Continue Clinimix 5/15 plain; advance to goal rate of 70 ml/hr.  Increase 20% fat emulsion to 10 ml/hr.  TPN to contain standard multivitamins and trace elements; Pepcid 20 mg/day (reduced for renal impairment) added to minimize H+ losses to stomach fluid. Protonix IV BID per MD for same reason.  IVFs per MD, see comments above  Sens SSI with q4 CBG checks  TPN lab panels on Mondays & Thursdays  F/u daily.  Junita PushMichelle Hoorain Kozakiewicz, PharmD, BCPS Pager: [phone] 12/05/2015, 8:25 AM

## 2015-12-05 NOTE — Progress Notes (Signed)
Daily Progress Note   Patient Name: Brandi Heath       Date: 12/05/2015 DOB: 03/09/1927  Age: 82 y.o. MRN#: [id number] Attending Physician: Brandi Mura, MD Primary Care Physician: Brandi Seashore, MD Admit Date: 11/30/2015  Reason for Consultation/Follow-up: Establishing goals of care  Subjective: I asked Brandi Heath again today if she wanted to consider surgery for her bowel obstruction.  She responded "I don't want no surgery".  She then told me she had not passed any gas, and that her favorite part of Thanksgiving dinner was pigs feet. She denies pain.  I spoke with Brandi Heath on the phone and mentioned her mother's desire not to have surgery.  I told her that if the bowel obstruction does not clear her mother would likely pass with in the next couple of weeks.  Brandi Heath commented that she had been thinking about that.  Length of Stay: 5  Current Medications: Scheduled Meds:  . chlorhexidine  15 mL Mouth Rinse BID  . insulin aspart  0-9 Units Subcutaneous Q4H  . levETIRAcetam  750 mg Intravenous Q12H  . mouth rinse  15 mL Mouth Rinse q12n4p  . metoprolol  5 mg Intravenous Q6H  . pantoprazole (PROTONIX) IV  40 mg Intravenous Q12H  . sodium chloride flush  10-40 mL Intracatheter Q12H  . sodium chloride flush  3 mL Intravenous Q12H    Continuous Infusions: . dextrose 5 % with kcl 125 mL/hr at 12/05/15 0138  . diltiazem (CARDIZEM) infusion Stopped (12/04/15 1900)  . TPN (CLINIMIX) Adult without lytes 70 mL/hr at 12/04/15 1724   And  . fat emulsion 240 mL (12/04/15 1900)  . heparin 700 Units/hr (12/04/15 1900)    PRN Meds: acetaminophen **OR** acetaminophen, ondansetron **OR** ondansetron (ZOFRAN) IV, sodium chloride flush  Physical Exam  Constitutional: She appears  well-developed.  Frail. Eye closed, but responds to my questions after I wake her.  HENT:  Head: Normocephalic and atraumatic.  Cardiovascular:  Murmur heard. Tachy irreg irreg  Pulmonary/Chest: Effort normal. No respiratory distress. She has no wheezes. She has no rales. She exhibits no tenderness.  Abdominal: Soft. There is no guarding.  I don't hear bowel sounds  Musculoskeletal: She exhibits no edema or deformity.  Neurological:  Awake.  Responds to questions and follows commands.  Skin: Skin is warm and dry. No  erythema.    Vital Signs: BP (!) 129/113   Pulse (!) 35   Temp 97.6 F (36.4 C) (Oral)   Resp 20   Ht 5\' 9"  (1.753 m)   Wt 54.2 kg (119 lb 7.8 oz)   SpO2 (!) 74%   BMI 17.65 kg/m  SpO2: SpO2: (!) 74 % O2 Device: O2 Device: Not Delivered O2 Flow Rate: O2 Flow Rate (L/min): 2 L/min  Intake/output summary:  Intake/Output Summary (Last 24 hours) at 12/05/15 0754 Last data filed at 12/05/15 0700  Gross per 24 hour  Intake          7274.16 ml  Output             3600 ml  Net          3674.16 ml   LBM: Last BM Date: 11/30/15 Baseline Weight: Weight: 66.2 kg (145 lb 15.1 oz) Most recent weight: Weight: 54.2 kg (119 lb 7.8 oz)       Palliative Assessment/Data:    Flowsheet Rows   Flowsheet Row Most Recent Value  Intake Tab  Referral Department  Hospitalist  Unit at Time of Referral  Intermediate Care Unit  Palliative Care Primary Diagnosis  Other (Comment)  Date Notified  12/04/15  Palliative Care Type  New Palliative care  Reason for referral  Clarify Goals of Care  Date of Admission  11/30/15  Date first seen by Palliative Care  12/04/15  # of days Palliative referral response time  0 Day(s)  # of days IP prior to Palliative referral  4  Clinical Assessment  Palliative Performance Scale Score  20%  Psychosocial & Spiritual Assessment  Palliative Care Outcomes      Patient Active Problem List   Diagnosis Date Noted  . Palliative care encounter    . Goals of care, counseling/discussion   . Protein-calorie malnutrition, severe 12/03/2015  . Pressure injury of skin 12/01/2015  . Acute renal failure (ARF) (HCC) 11/30/2015  . Dehydration 11/30/2015  . Acute kidney injury (HCC) 11/14/2015  . Non-ischemic cardiomyopathy (HCC) 11/11/2015  . MR (mitral regurgitation) 11/11/2015  . Atrial fibrillation with RVR (HCC) 10/28/2015  . Acute respiratory failure with hypoxia (HCC) 10/17/2015  . Slurred speech   . Left sided numbness   . Acute on chronic diastolic heart failure (HCC) 10/04/2015  . Normocytic anemia 10/04/2015  . Thrombocytopenia (HCC) 10/04/2015  . TIA (transient ischemic attack) 10/04/2015  . Cough 10/02/2015  . SBO (small bowel obstruction) 10/01/2015  . Chronic CHF (congestive heart failure) (HCC) 09/29/2015  . Venous insufficiency (chronic) (peripheral) 09/29/2015  . Vitamin B12 deficiency 09/29/2015  . Anemia, chronic disease 09/29/2015  . Enterococcus UTI 09/02/2015  . GERD (gastroesophageal reflux disease) 09/02/2015  . Adynamic ileus (HCC) 06/11/2015  . CHF, acute on chronic (HCC) 06/11/2015  . Personal history of DVT (deep vein thrombosis) 06/11/2015  . Non-intractable vomiting with nausea 06/03/2015  . Jaw pain 05/27/2015  . E. coli UTI 04/22/2015  . Prerenal acute renal failure (HCC) 04/22/2015  . History of CVA (cerebrovascular accident) 04/22/2015  . AF (atrial fibrillation) (HCC) 04/22/2015  . Polyneuropathy (HCC) 04/22/2015  . Edema 01/25/2015  . Atrial fibrillation with rapid ventricular response (HCC) 01/20/2015  . Acute encephalopathy 01/20/2015  . UTI (urinary tract infection) 01/20/2015  . Tracheobronchomalacia 01/20/2015  . Personal history of PE (pulmonary embolism) 01/20/2015  . Depression 01/20/2015  . OSA (obstructive sleep apnea)   . Dementia with behavioral disturbance   . Seizures (HCC)   .  Hyperlipidemia   . Hypertensive heart disease with CHF (congestive heart failure) (HCC)   .  Clotting disorder (HCC)   . Cerebrovascular accident (CVA) due to thrombosis [facility])     Palliative Care Assessment & Plan   Patient Profile: 82 y.o. 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  Brandi Heath has had 4 admits in the last 6 months.  She has been treated conservatively with bowel rest and an NG tube.  On admission she had acute kidney injury.  Her baseline creatinine is normally 1.0, and is now 1.6.  Her pulse and blood pressure appear labile.  Assessment: Elderly female with recurrent bowel obstruction. Not resolving with conservative management thus far.  Creatinine normalizing. Patient appears comfortable despite N/G  Recommendations/Plan:  Continue current care  Family meeting on Thursday at 2:00 pm.  Goals of Care and Additional Recommendations:  Limitations on Scope of Treatment: Full Scope Treatment  Code Status:  DNR  Prognosis:   < 2 weeks if the bowel obstruction does not clear.  Discharge Planning:  To Be Determined  Care plan was discussed with CCS, daughter Babette Relic.  Thank you for allowing the Palliative Medicine Team to assist in the care of this patient.   Time In: 8:00 Time Out: 8:25 Total Time 25 min Prolonged Time Billed no      Greater than 50%  of this time was spent counseling and coordinating care related to the above assessment and plan.  Algis Downs, PA-C Palliative Medicine   Please contact Palliative MedicineTeam phone at [phone] for questions and concerns.   Please see AMION for individual provider pager numbers.

## 2015-12-05 NOTE — Progress Notes (Signed)
Brandi Heath LKT:[id number] DOB: [birth year] DOA: 11/30/2015 PCP: Inocencio Homes, MD  Brief narrative: 82 y/o ? SNF [[facility]?] Recurrent SBO  Admission 11.1-->11.2.17 for CVA--turned out to be thought more 2/2 to [facility]  Admission 10/15-->10/18 for SBO-non op management  Admission 9/18-->9.25 SBO/Stroke/AEDCHF [full work-up performed then] H/o Afib on CHr AC=Chad2Vasc2=8--diagnosed ~ 2016 Dr. Marjo [facility] [no records available] H/o DVT/PE MOD-severe dementia on meds Sz disorder on Phenytoin OSA reported Depression on Meds  Admitted 11/20 with  High output SBO Hypernatreami sodd 152, K 2.3 Severe met alkaolsis Co4 46 Hypomagnesemia/hypophosphatemia/hypocalcemia  Placed on TPN, D5 + K 30 and loaded with Keppra On Cardizem and heparin Gtt    Past medical history-As per Problem list Consultants: Gen surgery consulted Palliative care has also been consulted and a meeting is scheduled for 12/06/2015 at 2 PM  Procedures:  None yet  Antibiotics:  Levaquin   Subjective   Somnolent but awakens Able to tell me date time here person president and able to orient fairly well Reassurance her discussions and thoughts and is still undecided about surgery No overt pain or discomfort Note that Cardizem GTT had been discontinued last BM as blood pressures were in the low 100s and as low as systolic 34K NG output is still relatively high with 650 over the past 8 hours   Objective    Interim History:   Telemetry: Sinus tach/A. fib 120   Objective: Vitals:   12/05/15 0350 12/05/15 0400 12/05/15 0600 12/05/15 0700  BP:  (!) 103/56 121/74 (!) 129/113  Pulse:      Resp:  _0 Temp: 97.6 F (36.4 C)     TempSrc: Oral     SpO2:  99% 92% (!) 74%  Weight:      Height:        Intake/Output Summary (Last 24 hours) at 12/05/15 0733 Last data filed at 12/05/15 0700  Gross per 24 hour  Intake          7274.16 ml  Output             3600 ml  Net           3674.16 ml    Exam:  General: Alert pleasant oriented, some weakness noted  no apparent distress, NG tube is in situ Cardiovascular: S1 and S2 tachycardic Respiratory: Clinically clear no added sound Abdomen: Soft nontender no rebound no guarding Skin no lower extremity edema no rash Neuro intact  Data Reviewed: Basic Metabolic Panel:  Recent Labs Lab 12/01/15 0316 12/02/15 0618 12/02/15 0621 12/03/15 0645 12/04/15 0516 12/05/15 0433  NA 146*  --  149* 153* 151* 134*  K 3.7  --  2.6* 2.9* 3.2* 3.2*  CL 100*  --  103 99* 86* 87*  CO2 33*  --  34* 46* >50* 39*  GLUCOSE 96  --  101* 163* 162* 151*  BUN 56*  --  45* 35* 29* 32*  CREATININE 2.90*  --  2.00* 1.60* 1.60* 1.26*  CALCIUM 9.0  --  9.3 9.4 9.7 8.5*  MG 1.9 1.9  --  2.4 1.9 1.3*  PHOS 4.5  --   --  2.1* 2.5 1.2*   Liver Function Tests:  Recent Labs Lab 11/30/15 1838 12/01/15 0316 12/03/15 0645  AST _1 ALT 16 13* 12*  ALKPHOS 86 62 59  BILITOT 0.8 0.7 0.4  PROT 8.5* 6.7 6.8  ALBUMIN 4.9 3.3* 3.3*    Recent Labs Lab  11/30/15 1838 12/03/15 0645  LIPASE 121* 37   No results for input(s): AMMONIA in the last 168 hours. CBC:  Recent Labs Lab 11/30/15 1838 12/01/15 0316 12/02/15 0621 12/03/15 0645 12/04/15 0516 12/05/15 0433  WBC 10.4 9.8 8.8 9.4 11.1* 11.8*  NEUTROABS 8.5*  --   --   --   --   --   HGB 14.3 11.7* 11.7* 11.1* 11.6* 9.8*  HCT 41.5 35.0* 36.0 34.5* 35.8* 29.1*  MCV 83.5 83.7 85.7 87.3 86.9 84.3  PLT 279 207 220 223 220 158   Cardiac Enzymes:  Recent Labs Lab 11/30/15 2212 12/01/15 0316 12/01/15 0755 12/02/15 0621  TROPONINI 0.05* 0.10* 0.12* 0.07*   BNP: Invalid input(s): POCBNP CBG:  Recent Labs Lab 12/04/15 1144 12/04/15 1610 12/04/15 1946 12/04/15 2312 12/05/15 0350  GLUCAP 139* 117* 147* 122* 142*    Recent Results (from the past 240 hour(s))  Urine culture     Status: Abnormal   Collection Time: 11/30/15  7:59 PM  Result Value Ref Range  Status   Specimen Description URINE, CATHETERIZED  Final   Special Requests NONE  Final   Culture 40,000 COLONIES/mL PROTEUS MIRABILIS (A)  Final   Report Status 12/03/2015 FINAL  Final   Organism ID, Bacteria PROTEUS MIRABILIS (A)  Final      Susceptibility   Proteus mirabilis - MIC*    AMPICILLIN >=32 RESISTANT Resistant     CEFAZOLIN <=4 SENSITIVE Sensitive     CEFTRIAXONE <=1 SENSITIVE Sensitive     CIPROFLOXACIN >=4 RESISTANT Resistant     GENTAMICIN <=1 SENSITIVE Sensitive     IMIPENEM 2 SENSITIVE Sensitive     NITROFURANTOIN 256 RESISTANT Resistant     TRIMETH/SULFA >=320 RESISTANT Resistant     AMPICILLIN/SULBACTAM 16 INTERMEDIATE Intermediate     PIP/TAZO <=4 SENSITIVE Sensitive     * 40,000 COLONIES/mL PROTEUS MIRABILIS  MRSA PCR Screening     Status: None   Collection Time: 11/30/15 11:46 PM  Result Value Ref Range Status   MRSA by PCR NEGATIVE NEGATIVE Final    Comment:        The GeneXpert MRSA Assay (FDA approved for NASAL specimens only), is one component of a comprehensive MRSA colonization surveillance program. It is not intended to diagnose MRSA infection nor to guide or monitor treatment for MRSA infections.      Studies:              All Imaging reviewed and is as per above notation   Scheduled Meds: . chlorhexidine  15 mL Mouth Rinse BID  . insulin aspart  0-9 Units Subcutaneous Q4H  . levETIRAcetam  750 mg Intravenous Q12H  . mouth rinse  15 mL Mouth Rinse q12n4p  . metoprolol  5 mg Intravenous Q6H  . pantoprazole (PROTONIX) IV  40 mg Intravenous Q12H  . sodium chloride flush  10-40 mL Intracatheter Q12H  . sodium chloride flush  3 mL Intravenous Q12H   Continuous Infusions: . dextrose 5 % with kcl 125 mL/hr at 12/05/15 0138  . diltiazem (CARDIZEM) infusion Stopped (12/04/15 1900)  . TPN (CLINIMIX) Adult without lytes 70 mL/hr at 12/04/15 1724   And  . fat emulsion 240 mL (12/04/15 1900)  . heparin 700 Units/hr (12/04/15 1900)      Assessment/Plan:  High output SBO-palliative care has been consulted and general surgery as well has been consulted and the consensus is patient may require surgery. The patient is undecided and tells me "i need a week"  to think about surgicalmoptions. Goals of care discussion pending 2 PM 12/06/2015  TPN management per pharmacy in general surgery-currently risk of refeeding syndrome as hypophosphatemia, hypomagnesemia, hypocalcemia and replaced trace elements as per pharmacy Keep NG tube in situ and monitor output daily  Atrial fibrillation with RVR [place] score 8 previously on Elliquis Continue heparin GTT Resume Cardizem GTT and titrate downwards but do not discontinue-home medication 360 every 24 hourly When necessary metoprolol 5 mg every 6 hours-home medication 100  twice a day  Altered electrolytes Continue TPNwith trace element replacement  Change to D5 to D5/normal saline as significant hypernatremia on 12/03/09/20 is now resolved Repeat labs a.m. -Supposed to be on magnesium oxide 400 daily as well as potassium chloride 20 mEq daily and is also on Lasix 40 daily  Compensated chronic diastolic heart failure See above discussion IV fluids going because of kidney injury  Seizure disorder previously on phenytoin Loaded with Keppra this admission  Acute kidney injury Patient on admission had BUN/creatinine 60/3.4 and significant metabolic alkalosis which has resolved continue IV fluid at above rate  NO family + currently SDU for close hemodynamic monitoring Keep tele Poor prognosis   Verneita Griffes, MD  [facility] Pager [phone] 12/05/2015, 7:33 AM    LOS: 5 days

## 2015-12-06 ENCOUNTER — Inpatient Hospital Stay (HOSPITAL_COMMUNITY): Payer: Medicare Other

## 2015-12-06 DIAGNOSIS — Z7189 Other specified counseling: Secondary | ICD-10-CM

## 2015-12-06 DIAGNOSIS — Z7901 Long term (current) use of anticoagulants: Secondary | ICD-10-CM

## 2015-12-06 LAB — CBC
HCT: 25.6 % — ABNORMAL LOW (ref 36.0–46.0)
Hemoglobin: 8.8 g/dL — ABNORMAL LOW (ref 12.0–15.0)
MCH: 28 pg (ref 26.0–34.0)
MCHC: 34.4 g/dL (ref 30.0–36.0)
MCV: 81.5 fL (ref 78.0–100.0)
PLATELETS: 152 10*3/uL (ref 150–400)
RBC: 3.14 MIL/uL — AB (ref 3.87–5.11)
RDW: 14.4 % (ref 11.5–15.5)
WBC: 9.3 10*3/uL (ref 4.0–10.5)

## 2015-12-06 LAB — COMPREHENSIVE METABOLIC PANEL
ALBUMIN: 2.8 g/dL — AB (ref 3.5–5.0)
ALK PHOS: 49 U/L (ref 38–126)
ALT: 22 U/L (ref 14–54)
ANION GAP: 6 (ref 5–15)
AST: 25 U/L (ref 15–41)
BUN: 34 mg/dL — ABNORMAL HIGH (ref 6–20)
CALCIUM: 8.4 mg/dL — AB (ref 8.9–10.3)
CO2: 31 mmol/L (ref 22–32)
Chloride: 99 mmol/L — ABNORMAL LOW (ref 101–111)
Creatinine, Ser: 1.13 mg/dL — ABNORMAL HIGH (ref 0.44–1.00)
GFR calc non Af Amer: 43 mL/min — ABNORMAL LOW (ref >60)
GFR, EST AFRICAN AMERICAN: 50 mL/min — AB (ref >60)
Glucose, Bld: 127 mg/dL — ABNORMAL HIGH (ref 65–99)
POTASSIUM: 3.8 mmol/L (ref 3.5–5.1)
SODIUM: 136 mmol/L (ref 135–145)
Total Bilirubin: 0.6 mg/dL (ref 0.3–1.2)
Total Protein: 5.7 g/dL — ABNORMAL LOW (ref 6.5–8.1)

## 2015-12-06 LAB — APTT: APTT: 95 s — AB (ref 24–36)

## 2015-12-06 LAB — GLUCOSE, CAPILLARY
GLUCOSE-CAPILLARY: 103 mg/dL — AB (ref 65–99)
GLUCOSE-CAPILLARY: 118 mg/dL — AB (ref 65–99)
GLUCOSE-CAPILLARY: 121 mg/dL — AB (ref 65–99)
Glucose-Capillary: 113 mg/dL — ABNORMAL HIGH (ref 65–99)
Glucose-Capillary: 106 mg/dL — ABNORMAL HIGH (ref 65–99)
Glucose-Capillary: 112 mg/dL — ABNORMAL HIGH (ref 65–99)

## 2015-12-06 LAB — HEPARIN LEVEL (UNFRACTIONATED)
HEPARIN UNFRACTIONATED: 0.29 [IU]/mL — AB (ref 0.30–0.70)
Heparin Unfractionated: 0.46 IU/mL (ref 0.30–0.70)

## 2015-12-06 LAB — PHOSPHORUS: PHOSPHORUS: 2.4 mg/dL — AB (ref 2.5–4.6)

## 2015-12-06 LAB — MAGNESIUM: Magnesium: 1.7 mg/dL (ref 1.7–2.4)

## 2015-12-06 MED ORDER — MAGNESIUM SULFATE IN D5W 1-5 GM/100ML-% IV SOLN
1.0000 g | Freq: Once | INTRAVENOUS | Status: AC
  Administered 2015-12-06: 1 g via INTRAVENOUS
  Filled 2015-12-06: qty 100

## 2015-12-06 MED ORDER — POTASSIUM PHOSPHATES 15 MMOLE/5ML IV SOLN
15.0000 mmol | Freq: Once | INTRAVENOUS | Status: AC
  Administered 2015-12-06: 15 mmol via INTRAVENOUS
  Filled 2015-12-06: qty 5

## 2015-12-06 MED ORDER — FAT EMULSION 20 % IV EMUL
240.0000 mL | INTRAVENOUS | Status: DC
  Filled 2015-12-06: qty 250

## 2015-12-06 MED ORDER — BISACODYL 10 MG RE SUPP
10.0000 mg | Freq: Every day | RECTAL | Status: DC
  Administered 2015-12-06 – 2015-12-11 (×5): 10 mg via RECTAL
  Filled 2015-12-06 (×6): qty 1

## 2015-12-06 MED ORDER — TRACE MINERALS CR-CU-MN-SE-ZN 10-1000-500-60 MCG/ML IV SOLN
INTRAVENOUS | Status: DC
  Filled 2015-12-06: qty 1680

## 2015-12-06 MED ORDER — MUSCLE RUB 10-15 % EX CREA
TOPICAL_CREAM | Freq: Every day | CUTANEOUS | Status: DC
  Administered 2015-12-06 – 2015-12-10 (×5): via TOPICAL
  Administered 2015-12-11: 1 via TOPICAL
  Administered 2015-12-12 – 2015-12-16 (×3): via TOPICAL
  Administered 2015-12-18: 1 via TOPICAL
  Administered 2015-12-19 – 2015-12-23 (×5): via TOPICAL
  Administered 2015-12-24: 1 via TOPICAL
  Filled 2015-12-06 (×3): qty 85

## 2015-12-06 MED ORDER — LIP MEDEX EX OINT
1.0000 "application " | TOPICAL_OINTMENT | Freq: Two times a day (BID) | CUTANEOUS | Status: DC
  Administered 2015-12-06 – 2015-12-25 (×38): 1 via TOPICAL
  Filled 2015-12-06 (×8): qty 7

## 2015-12-06 MED ORDER — MAGIC MOUTHWASH
15.0000 mL | Freq: Four times a day (QID) | ORAL | Status: DC | PRN
  Filled 2015-12-06 (×2): qty 15

## 2015-12-06 MED ORDER — TROLAMINE SALICYLATE 10 % EX CREA
TOPICAL_CREAM | Freq: Every day | CUTANEOUS | Status: DC
  Filled 2015-12-06: qty 85

## 2015-12-06 MED ORDER — DEXTROSE 50 % IV SOLN
INTRAVENOUS | Status: AC
  Filled 2015-12-06: qty 50

## 2015-12-06 MED ORDER — MENTHOL 3 MG MT LOZG: 1.0000 | LOZENGE | OROMUCOSAL | Status: DC | PRN

## 2015-12-06 MED ORDER — PHENOL 1.4 % MT LIQD
2.0000 | OROMUCOSAL | Status: DC | PRN
  Filled 2015-12-06: qty 177

## 2015-12-06 NOTE — Progress Notes (Signed)
Daily Progress Note   Patient Name: Brandi Heath       Date: 12/06/2015 DOB: [birth year]  Age: 82 y.o. MRN#: [id number] Attending Physician: Nita Sells, MD Primary Care Physician: Inocencio Homes, MD Admit Date: 11/30/2015  Reason for Consultation/Follow-up: Establishing goals of care  Subjective: Patient is more alert and enjoying her family today.  I met with the family in a conference room.  Daughters (Tammy, [place], [place], [place], [place], and [place]) [place] daughters [place] and [place]) along with son in Sports coach, [place].   We discussed a brief life review.  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  We then reviewed her overall health (tia/seizure/dementia; CHF/MR/Afib; recurrent bowel obs; clotting disorder; debility and malnutrition).  The family understands that Mrs. Frankum does not want surgery or invasive procedures.  They support this decision.   They do not want artificial feeding.    We discussed Hospice Services at [place].  Erline Levine (grand daughter) has 28+ years in health care and is a proponent of [employer].  The patient's sister spent time in [facility].  The family was supportive of Hospice services in general.  They decided that I should meet with 1 daughter Alena Blankenbeckler) and the patient tomorrow to present the options of SNF for rehab vs residential hospice to the patient and let her decide.    If the patient is of the mind not to return to the hospital or seek aggressive treatment she is very likely residential hospice eligible.   Length of Stay: 6  Current Medications: Scheduled Meds:  . bisacodyl  10 mg Rectal Daily  . chlorhexidine  15 mL Mouth Rinse BID  . insulin aspart  0-9 Units Subcutaneous Q4H  . levETIRAcetam   750 mg Intravenous Q12H  . lip balm  1 application Topical BID  . mouth rinse  15 mL Mouth Rinse q12n4p  . pantoprazole (PROTONIX) IV  40 mg Intravenous Q12H  . potassium phosphate IVPB (mmol)  15 mmol Intravenous Once  . sodium chloride flush  10-40 mL Intracatheter Q12H  . sodium chloride flush  3 mL Intravenous Q12H    Continuous Infusions: . dextrose 5 %-0.9% nacl with kcl 100 mL/hr at 12/06/15 0908  . diltiazem (CARDIZEM) infusion 5 mg/hr (12/06/15 0500)  . TPN (  CLINIMIX) Adult without lytes 70 mL/hr at 12/05/15 1731   And  . fat emulsion 240 mL (12/05/15 1731)  . Marland KitchenTPN (CLINIMIX-E) Adult     And  . fat emulsion    . heparin 950 Units/hr (12/06/15 0809)    PRN Meds: acetaminophen **OR** acetaminophen, magic mouthwash, menthol-cetylpyridinium, ondansetron **OR** ondansetron (ZOFRAN) IV, phenol, sodium chloride flush  Physical Exam  Constitutional: She appears well-developed.  Frail. More alert today.  Family present.  HENT:  Head: Normocephalic and atraumatic.  Cardiovascular:  Murmur heard. Tachy irreg irreg  Pulmonary/Chest: Effort normal. No respiratory distress. She has no wheezes. She has no rales. She exhibits no tenderness.  Abdominal: Soft.  Gurgling bowel sounds are present.  Musculoskeletal: She exhibits no edema or deformity.  Neurological:  Awake.  Responds to questions and follows commands.  Skin: Skin is warm and dry. No erythema.    Vital Signs: BP (!) 97/49   Pulse (!) 35   Temp 98.6 F (37 C) (Oral)   Resp 19   Ht 5' 9"  (1.753 m)   Wt 54.2 kg (119 lb 7.8 oz)   SpO2 98%   BMI 17.65 kg/m  SpO2: SpO2: 98 % O2 Device: O2 Device: Not Delivered O2 Flow Rate: O2 Flow Rate (L/min): 2 L/min  Intake/output summary:   Intake/Output Summary (Last 24 hours) at 12/06/15 1243 Last data filed at 12/06/15 0800  Gross per 24 hour  Intake             2467 ml  Output             2685 ml  Net             -218 ml   LBM: Last BM Date: 11/30/15 Baseline  Weight: Weight: 66.2 kg (145 lb 15.1 oz) Most recent weight: Weight: 54.2 kg (119 lb 7.8 oz)       Palliative Assessment/Data:    Flowsheet Rows   Flowsheet Row Most Recent Value  Intake Tab  Referral Department  Hospitalist  Unit at Time of Referral  Intermediate Care Unit  Palliative Care Primary Diagnosis  Other (Comment)  Date Notified  12/04/15  Palliative Care Type  New Palliative care  Reason for referral  Clarify Goals of Care  Date of Admission  11/30/15  Date first seen by Palliative Care  12/04/15  # of days Palliative referral response time  0 Day(s)  # of days IP prior to Palliative referral  4  Clinical Assessment  Palliative Performance Scale Score  20%  Psychosocial & Spiritual Assessment  Palliative Care Outcomes      Patient Active Problem List   Diagnosis Date Noted  . Chronic anticoagulation 12/06/2015  . Palliative care encounter   . Goals of care, counseling/discussion   . Protein-calorie malnutrition, severe 12/03/2015  . Pressure injury of skin 12/01/2015  . Acute renal failure (ARF) ([place]) 11/30/2015  . Dehydration 11/30/2015  . Acute kidney injury ([place]) 11/14/2015  . Non-ischemic cardiomyopathy ([place]) 11/11/2015  . MR (mitral regurgitation) 11/11/2015  . Atrial fibrillation with RVR ([place]) 10/28/2015  . Acute respiratory failure with hypoxia ([place]) 10/17/2015  . Slurred speech   . Left sided numbness   . Acute on chronic diastolic heart failure ([place]) 10/04/2015  . Normocytic anemia 10/04/2015  . Thrombocytopenia ([place]) 10/04/2015  . TIA (transient ischemic attack) 10/04/2015  . Cough 10/02/2015  . SBO (small bowel obstruction) 10/01/2015  . Chronic CHF (congestive heart failure) ([place]) 09/29/2015  . Venous insufficiency (chronic) (peripheral)  09/29/2015  . Vitamin B12 deficiency 09/29/2015  . Anemia, chronic disease 09/29/2015  . Enterococcus UTI 09/02/2015  . GERD (gastroesophageal reflux disease) 09/02/2015  . Adynamic ileus ([place])  06/11/2015  . CHF, acute on chronic ([place]) 06/11/2015  . Personal history of DVT (deep vein thrombosis) 06/11/2015  . Non-intractable vomiting with nausea 06/03/2015  . Abdominal aortic aneurysm without rupture ([place]) 05/31/2015  . Jaw pain 05/27/2015  . E. coli UTI 04/22/2015  . Prerenal acute renal failure ([place]) 04/22/2015  . History of CVA (cerebrovascular accident) 04/22/2015  . AF (atrial fibrillation) ([place]) 04/22/2015  . Polyneuropathy ([place]) 04/22/2015  . Edema 01/25/2015  . Atrial fibrillation with rapid ventricular response ([place]) 01/20/2015  . Acute encephalopathy 01/20/2015  . UTI (urinary tract infection) 01/20/2015  . Tracheobronchomalacia 01/20/2015  . Personal history of PE (pulmonary embolism) 01/20/2015  . Depression 01/20/2015  . OSA (obstructive sleep apnea)   . Dementia with behavioral disturbance   . Seizures ([place])   . Hyperlipidemia   . Hypertensive heart disease with CHF (congestive heart failure) ([place])   . Clotting disorder ([place])   . Cerebrovascular accident (CVA) due to thrombosis [facility])     Palliative Care Assessment & Plan   Patient Profile: 82 y.o. 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  Mrs. Pha has had 4 admits in the last 6 months.  She has been treated conservatively with bowel rest and an NG tube.  On admission she had acute kidney injury.  Her baseline creatinine is normally 1.0, and is now 1.6.  Her pulse and blood pressure appear labile.  Assessment: Elderly female with recurrent bowel obstruction. Clinically improving.  This is her 4th admission in two months.  She is in a cycle of returning to the hospital receiving artificial hydration and conservative management,  improving and then being discharged into her normal environment where she dehydrates and falters  again.   Recommendations/Plan:  Continue current care  Family is hopeful for continued improvement.  Plan to speak with the patient tomorrow about residential hospice vs SNF w/ Palliative.  Goals of Care and Additional Recommendations:  Limitations on Scope of Treatment: Minimize Medications, No Artificial Feeding and No Surgical Procedures  Code Status:  DNR  Prognosis:  Likely weeks given recurrent bowel obstructions, debility, malnutrition, dementia  Discharge Planning:  To Be Determined  Care plan was discussed with [facility] MD, Bedside RN and extended family.  Thank you for allowing the Palliative Medicine Team to assist in the care of this patient.   Time In: 2:00 Time Out: 3:22 Total Time 82 min Prolonged Time Billed yes      Greater than 50%  of this time was spent counseling and coordinating care related to the above assessment and plan.  Imogene Burn, PA-C Palliative Medicine   Please contact Palliative MedicineTeam phone at [phone] for questions and concerns.   Please see AMION for individual provider pager numbers.

## 2015-12-06 NOTE — Progress Notes (Signed)
Subjective: No major events. Pt denies abd pain. But NG output continues to decrease  Objective: Vital signs in last 24 hours: Temp:  [97.6 F (36.4 C)-99.4 F (37.4 C)] 98.6 F (37 C) (11/23 0800) Resp:  [13-25] 19 (11/23 0500) BP: (89-127)/(48-99) 97/49 (11/23 0500) SpO2:  [98 %-100 %] 98 % (11/22 1800) Last BM Date: 11/30/15  Intake/Output from previous day: 11/22 0701 - 11/23 0700 In: 4072.1 [I.V.:3412.1; IV Piggyback:660] Out: 9024 [OXBDZ:3299; Emesis/NG output:900] Intake/Output this shift: Total I/O In: -  Out: 400 [Urine:400]  Getting a bath. 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  Soft, nd, nt  Lab Results:   Recent Labs  12/05/15 0433 12/06/15 0446  WBC 11.8* 9.3  HGB 9.8* 8.8*  HCT 29.1* 25.6*  PLT 158 152   BMET  Recent Labs  12/05/15 0433 12/06/15 0446  NA 134* 136  K 3.2* 3.8  CL 87* 99*  CO2 39* 31  GLUCOSE 151* 127*  BUN 32* 34*  CREATININE 1.26* 1.13*  CALCIUM 8.5* 8.4*   PT/INR No results for input(s): LABPROT, INR in the last 72 hours. ABG  Recent Labs  12/04/15 1110  HCO3 51.4*    Studies/Results: No results found.  Anti-infectives: Anti-infectives    Start     Dose/Rate Route Frequency Ordered Stop   12/03/15 0830  fluconazole (DIFLUCAN) tablet 150 mg     150 mg Oral  Once 12/03/15 0828 12/03/15 0859   12/02/15 2200  levofloxacin (LEVAQUIN) IVPB 500 mg  Status:  Discontinued     500 mg 100 mL/hr over 60 Minutes Intravenous Every 48 hours 11/30/15 2202 12/03/15 1036   11/30/15 2230  aztreonam (AZACTAM) 500 mg in dextrose 5 % 50 mL IVPB  Status:  Discontinued     500 mg 100 mL/hr over 30 Minutes Intravenous Every 8 hours 11/30/15 2203 12/01/15 0732   11/30/15 2145  levofloxacin (LEVAQUIN) IVPB 750 mg     750 mg 100 mL/hr over 90 Minutes Intravenous  Once 11/30/15 2130 11/30/15 2327   11/30/15 2145  aztreonam (AZACTAM) 2 g in dextrose 5 % 50 mL IVPB  Status:  Discontinued     2 g 100 mL/hr over 30 Minutes Intravenous  Once 11/30/15  2130 11/30/15 2203      Assessment/Plan: Pancreatitis - resolve SBO Dementia AKI Atrial fibrillation with RVR Cardiomyopathy H/o dvt/pe Recent TIA vs seizure  ?dementia - for past 2 days she has been alert and ox3; today she tells me Garnet Koyanagi is president, it is Friday and it is Thanksgiving; yesterday she told me it was nov and 2017  Her NG tube output started to decrease about 3 days ago - 5L-->2L-->900cc/24hrs. It has also lightened up as well. Was dark brown/thick now just has clear contents in canister.   Plain film of abd this am not read by rads yet looks improved to me. Has more air in colon. Less SB dilation.   I think her SBO may finally be improving. However she has had several similar episodes over past few months.  For now I would not recommend or consider surgery since she has finally started to show clinical improvement.  When asked if the only way to relieve her intestinal blockage would be surgery pt states she would not want surgery.   Will cont to follow No family at [facility]. Andrey Campanile, MD, FACS General, Bariatric, & Minimally Invasive Surgery [facility], [place]   LOS: 6 days    ANTHA, DOSIER 12/06/2015

## 2015-12-06 NOTE — Progress Notes (Signed)
ANTICOAGULATION CONSULT NOTE - Follow Up Consult  Pharmacy Consult for Heparin Indication: atrial fibrillation  Patient Measurements: Height: 5\' 9"  (175.3 cm) Weight: 119 lb 7.8 oz (54.2 kg) IBW/kg (Calculated) : 66.2 Heparin Dosing Weight: actual weight  Medications:  Infusions:  . dextrose 5 %-0.9% nacl with kcl 100 mL/hr at 12/05/15 2300  . diltiazem (CARDIZEM) infusion 5 mg/hr (12/06/15 0500)  . TPN (CLINIMIX) Adult without lytes 70 mL/hr at 12/05/15 1731   And  . fat emulsion 240 mL (12/05/15 1731)  . heparin 800 Units/hr (12/05/15 0848)    Assessment: Brandi Heath admitted with UTI/sepsis. PMH significant for AFib and patient is on oral anticoagulation PTA with Eliquis, held for NPO status.  Pharmacy consulted to dose IV heparin for AFib, while Eliquis is on hold.  Today, 12/06/15  HL subtherapeutic today on 800 units/hr  CBC- Hg, pltc continue to decrease  CrCl ~63ml/min, improving  No bleeding or infusion issues reported by RN  Goal of Therapy:  Heparin level 0.3-0.7 Monitor platelets by anticoagulation protocol: Yes   Plan:   Increase IV heparin rate to 950 units/hr  Re-check 8hr heparin level  Daily heparin level and CBC  Monitor for signs of bleeding or thrombosis  F/U plans for anticoagulation after family mtg today  Junita Push, PharmD, BCPS Pager: [phone] 12/06/2015, 8:10 AM

## 2015-12-06 NOTE — Progress Notes (Signed)
Brandi Heath GDJ:[id number] DOB: 07/06/1927 DOA: 11/30/2015 PCP: Inocencio Homes, MD  Brief narrative: 82 y/o ? SNF [[facility]?] Recurrent SBO  Admission 11.1-->11.2.17 for CVA--turned out to be thought more 2/2 to [facility]  Admission 10/15-->10/18 for SBO-non op management  Admission 9/18-->9.25 SBO/Stroke/AEDCHF [full work-up performed then] H/o Afib on CHr AC=Chad2Vasc2=8--diagnosed ~ 2016 Dr. Marjo Bicker [facility] [no records available] H/o DVT/PE MOD-severe dementia on meds Sz disorder on Phenytoin OSA reported Depression on Meds  Admitted 11/20 with  High output SBO Hypernatreami sodd 152, K 2.3 Severe met alkaolsis Co4 46 Hypomagnesemia/hypophosphatemia/hypocalcemia  Placed on TPN, D5 + K 30 and loaded with Keppra On Cardizem and heparin Gtt    Past medical history-As per Problem list Consultants: Gen surgery consulted Palliative care has also been consulted and a meeting is scheduled for 12/06/2015 at 2 PM  Procedures:  None yet  Antibiotics:  Levaquin   Subjective   Awake alert when I passed by earlier Tired after 10 family members and palliative discussiion NG OP since am shift ~2.4 liters out Overall about the same-but passed some mucous per rectum Seems like she may be turning the corner--trajectory however still remains the same     Objective    Interim History:   Telemetry: Sinus tach/A. fib 120   Objective: Vitals:   12/06/15 0200 12/06/15 0343 12/06/15 0400 12/06/15 0500  BP: (!) 102/56  105/90 (!) 97/49  Pulse:      Resp: _0 Temp:  99.3 F (37.4 C)    TempSrc:  Axillary    SpO2:      Weight:      Height:        Intake/Output Summary (Last 24 hours) at 12/06/15 0739 Last data filed at 12/06/15 0500  Gross per 24 hour  Intake          4072.11 ml  Output             3385 ml  Net           687.11 ml    Exam:  General: Alert pleasant oriented, some weakness noted  no apparent distress, NG tube is in  situ Cardiovascular: S1 and S2 tachycardic Respiratory: Clinically clear no added sound Abdomen: Soft nontender no rebound no guarding Skin no lower extremity edema no rash Neuro intact  Data Reviewed: Basic Metabolic Panel:  Recent Labs Lab 12/01/15 0316 12/02/15 0618 12/02/15 0621 12/03/15 0645 12/04/15 0516 12/05/15 0433 12/06/15 0446  NA 146*  --  149* 153* 151* 134* 136  K 3.7  --  2.6* 2.9* 3.2* 3.2* 3.8  CL 100*  --  103 99* 86* 87* 99*  CO2 33*  --  34* 46* >50* 39* 31  GLUCOSE 96  --  101* 163* 162* 151* 127*  BUN 56*  --  45* 35* 29* 32* 34*  CREATININE 2.90*  --  2.00* 1.60* 1.60* 1.26* 1.13*  CALCIUM 9.0  --  9.3 9.4 9.7 8.5* 8.4*  MG 1.9 1.9  --  2.4 1.9 1.3* 1.7  PHOS 4.5  --   --  2.1* 2.5 1.2* 2.4*   Liver Function Tests:  Recent Labs Lab 11/30/15 1838 12/01/15 0316 12/03/15 0645 12/06/15 0446  AST _1 ALT 16 13* 12* 22  ALKPHOS 86 62 59 49  BILITOT 0.8 0.7 0.4 0.6  PROT 8.5* 6.7 6.8 5.7*  ALBUMIN 4.9 3.3* 3.3* 2.8*    Recent Labs Lab 11/30/15 1838 12/03/15  0645  LIPASE 121* 37   No results for input(s): AMMONIA in the last 168 hours. CBC:  Recent Labs Lab 11/30/15 1838  12/02/15 0621 12/03/15 0645 12/04/15 0516 12/05/15 0433 12/06/15 0446  WBC 10.4  < > 8.8 9.4 11.1* 11.8* 9.3  NEUTROABS 8.5*  --   --   --   --   --   --   HGB 14.3  < > 11.7* 11.1* 11.6* 9.8* 8.8*  HCT 41.5  < > 36.0 34.5* 35.8* 29.1* 25.6*  MCV 83.5  < > 85.7 87.3 86.9 84.3 81.5  PLT 279  < > 220 223 220 158 152  < > = values in this interval not displayed. Cardiac Enzymes:  Recent Labs Lab 11/30/15 2212 12/01/15 0316 12/01/15 0755 12/02/15 0621  TROPONINI 0.05* 0.10* 0.12* 0.07*   BNP: Invalid input(s): POCBNP CBG:  Recent Labs Lab 12/05/15 1212 12/05/15 1549 12/05/15 2005 12/05/15 2351 12/06/15 0341  GLUCAP 133* 111* 117* 124* 113*    Recent Results (from the past 240 hour(s))  Urine culture     Status: Abnormal   Collection  Time: 11/30/15  7:59 PM  Result Value Ref Range Status   Specimen Description URINE, CATHETERIZED  Final   Special Requests NONE  Final   Culture 40,000 COLONIES/mL PROTEUS MIRABILIS (A)  Final   Report Status 12/03/2015 FINAL  Final   Organism ID, Bacteria PROTEUS MIRABILIS (A)  Final      Susceptibility   Proteus mirabilis - MIC*    AMPICILLIN >=32 RESISTANT Resistant     CEFAZOLIN <=4 SENSITIVE Sensitive     CEFTRIAXONE <=1 SENSITIVE Sensitive     CIPROFLOXACIN >=4 RESISTANT Resistant     GENTAMICIN <=1 SENSITIVE Sensitive     IMIPENEM 2 SENSITIVE Sensitive     NITROFURANTOIN 256 RESISTANT Resistant     TRIMETH/SULFA >=320 RESISTANT Resistant     AMPICILLIN/SULBACTAM 16 INTERMEDIATE Intermediate     PIP/TAZO <=4 SENSITIVE Sensitive     * 40,000 COLONIES/mL PROTEUS MIRABILIS  MRSA PCR Screening     Status: None   Collection Time: 11/30/15 11:46 PM  Result Value Ref Range Status   MRSA by PCR NEGATIVE NEGATIVE Final    Comment:        The GeneXpert MRSA Assay (FDA approved for NASAL specimens only), is one component of a comprehensive MRSA colonization surveillance program. It is not intended to diagnose MRSA infection nor to guide or monitor treatment for MRSA infections.      Studies:              All Imaging reviewed and is as per above notation   Scheduled Meds: . bisacodyl  10 mg Rectal Daily  . chlorhexidine  15 mL Mouth Rinse BID  . insulin aspart  0-9 Units Subcutaneous Q4H  . levETIRAcetam  750 mg Intravenous Q12H  . lip balm  1 application Topical BID  . mouth rinse  15 mL Mouth Rinse q12n4p  . pantoprazole (PROTONIX) IV  40 mg Intravenous Q12H  . sodium chloride flush  10-40 mL Intracatheter Q12H  . sodium chloride flush  3 mL Intravenous Q12H   Continuous Infusions: . dextrose 5 %-0.9% nacl with kcl 100 mL/hr at 12/05/15 2300  . diltiazem (CARDIZEM) infusion 5 mg/hr (12/06/15 0500)  . TPN (CLINIMIX) Adult without lytes 70 mL/hr at 12/05/15 1731    And  . fat emulsion 240 mL (12/05/15 1731)  . heparin 800 Units/hr (12/05/15 0848)     Assessment/Plan:  High output SBO-palliative care has been consulted and general surgery as well has been consulted  Patients family  and palliative concur palliative trajectory and surgery thinks surgery needed only if dire need TPN management per pharmacy 70-->35 in general surgery-refeeding syndrome risk lessening and elctrolytes look better Keep NG tube in situ and monitor output daily  Atrial fibrillation with RVR [place] score 8 previously on Elliquis Continue heparin GTT--will make decisions regard this and absolutely indicated meds in 24 hrs Resume Cardizem GTT and titrate downwards but do not discontinue-home medication 360 every 24 hourly When necessary metoprolol 5 mg every 6 hours-home medication 100  twice a day  Altered electrolytes Risk for re-feeding syndrome Continue TPN with trace element replacement  Change to D5 to D5/normal saline as significant hypernatremia on 12/03/09/20 is now resolved At home on magnesium oxide 400 daily as well as potassium chloride 20 mEq daily and is also on Lasix 40 daily  Compensated chronic diastolic heart failure See above discussion IV fluids going because of kidney injury  Seizure disorder previously on phenytoin Loaded with Keppra this admission  Acute kidney injury Patient on admission had BUN/creatinine 60/3.4 and significant metabolic alkalosis which has resolved continue IV fluid at above rate  Briefly saw family Appreciate input from surgery and pallaitve SDU for now Keep tele Very Poor overall prognosis--note that she hasn;t discussed the final decision re: surgery or not and this is forthcoming but she is definitely residential Hospice appropriate   Verneita Griffes, MD  [facility] Pager [phone] 12/06/2015, 7:39 AM    LOS: 6 days

## 2015-12-06 NOTE — Progress Notes (Addendum)
PHARMACY - ADULT TOTAL PARENTERAL NUTRITION CONSULT NOTE   Pharmacy Consult for TPN Indication: Recurrent SBO  Patient Measurements: Height: 5\' 9"  (175.3 cm) Weight: 119 lb 7.8 oz (54.2 kg) IBW/kg (Calculated) : 66.2 TPN AdjBW (KG): 54.2 Body mass index is 17.65 kg/m. Usual Weight: 77 kg  Insulin Requirements: 2 units Novolog in past 24h hrs  Current Nutrition: NPO  IVF: D5NS + 60 KCl @ 100 ml/hr  Central access: PICC 11/19 TPN start date: 12/03/15   ASSESSMENT                                                                                                          HPI: 82 y.o. female with medical history significant of  CAD, dementia, DVT/PE, OSA, AFib on anticoagulation, recurrent SBO, CHF, NICM with mitral regurgitation, seizure disorder, recent stroke; presented with N/V, abd pain, and AMS. Recently admitted for SBO in October which improved with conservative mgmt. NGT currently with high output and remains NPO  Significant events:  11/20: patient signalling for water; appears to be thirsty  Today:    Glucose - No Hx DM; CBGs at goal <150  Electrolytes - Much improved today and wnl except phosphorus which is slightly low.  CorrCa wnl. Given high risk for refeeding syndrome will continue to replete to target goals of Mag>2, K+>4, Phos>4  Renal - SCr improving; CrCl ~30 ml/min;   I/O -Good UOP; also still +1.15L fluid from NG tube   LFTs - albumin sl low; o/w normal  TGs - wnl  Prealbumin - slightly low  NUTRITIONAL GOALS                                                                                             RD recs: Kcal:  1500-1700 Protein:  70-80g Fluid:  1.5-1.7L/day  Clinimix 5/15 at a goal rate of 70 ml/hr + 20% fat emulsion at 10 ml/hr to provide: 84 g/day protein, 1672 Kcal/day.  PLAN                                                                                                                          NOW:  Magnesium 1gm  IV x1  KPhos  IV x1 (provides K+ & of of Phos)  Continue IVF -D5NS + 60 K @ 112ml/hr.  At 1800 today:  Change Clinimix E 5/15 (add electrolytes with improvement in renal fxn); Continue at goal rate of 70 ml/hr.  Continue 20% fat emulsion at 10 ml/hr.  TPN to contain standard multivitamins and trace elements; Pepcid 20 mg/day (reduced for renal impairment) added to minimize H+ losses to stomach fluid. Protonix IV BID per MD for same reason.  IVFs per MD, see comments above  Sens SSI with q4 CBG checks  TPN lab panels on Mondays & Thursdays.  Repeat Bmet, Mag, Phos in am.   F/u daily.  Junita Push, PharmD, BCPS Pager: [phone] 12/06/2015, 8:13 AM  Spoke with Luisa Hart, RN who states family has decided to stop TPN (after family meeting this afternoon).   Decrease current TPN to 41ml/hr and then discontinue at 6p. Check CBGs x2 after TPN stops to ensure patient not hypoglycemic.  Cont IVF per MD. D/C TPN labs.  Junita Push, PharmD, BCPS Pager: [phone] 12/06/2015@3 :26 PM

## 2015-12-06 NOTE — Progress Notes (Signed)
Pharmacy - IV heparin  Assessment:    Please see note from Junita Push, PharmD earlier today for full details.  Briefly, 82 y.o. female on IV heparin for AFib while unable to take PTA Eliquis. Most recent heparin level slightly subtherapeutic.   Repeat heparin level therapeutic at 0.46 on 950 units/hr  No bleeding/line issues per RN  Plan:   Continue IV heparin at 950 units/hr  Bernadene Person, PharmD, BCPS Pager: [phone] 12/06/2015, 3:06 PM

## 2015-12-07 ENCOUNTER — Inpatient Hospital Stay (HOSPITAL_COMMUNITY): Payer: Medicare Other

## 2015-12-07 LAB — COMPREHENSIVE METABOLIC PANEL
ALBUMIN: 2.8 g/dL — AB (ref 3.5–5.0)
ALK PHOS: 50 U/L (ref 38–126)
ALT: 18 U/L (ref 14–54)
AST: 18 U/L (ref 15–41)
Anion gap: 4 — ABNORMAL LOW (ref 5–15)
BUN: 28 mg/dL — ABNORMAL HIGH (ref 6–20)
CALCIUM: 8.3 mg/dL — AB (ref 8.9–10.3)
CO2: 26 mmol/L (ref 22–32)
CREATININE: 0.99 mg/dL (ref 0.44–1.00)
Chloride: 107 mmol/L (ref 101–111)
GFR calc non Af Amer: 50 mL/min — ABNORMAL LOW (ref >60)
GFR, EST AFRICAN AMERICAN: 58 mL/min — AB (ref >60)
GLUCOSE: 122 mg/dL — AB (ref 65–99)
Potassium: 4.8 mmol/L (ref 3.5–5.1)
SODIUM: 137 mmol/L (ref 135–145)
Total Bilirubin: 0.6 mg/dL (ref 0.3–1.2)
Total Protein: 5.7 g/dL — ABNORMAL LOW (ref 6.5–8.1)

## 2015-12-07 LAB — CBC
HCT: 24.6 % — ABNORMAL LOW (ref 36.0–46.0)
Hemoglobin: 8.6 g/dL — ABNORMAL LOW (ref 12.0–15.0)
MCH: 28.9 pg (ref 26.0–34.0)
MCHC: 35 g/dL (ref 30.0–36.0)
MCV: 82.6 fL (ref 78.0–100.0)
PLATELETS: 157 10*3/uL (ref 150–400)
RBC: 2.98 MIL/uL — AB (ref 3.87–5.11)
RDW: 14.3 % (ref 11.5–15.5)
WBC: 6.5 10*3/uL (ref 4.0–10.5)

## 2015-12-07 LAB — GLUCOSE, CAPILLARY
GLUCOSE-CAPILLARY: 119 mg/dL — AB (ref 65–99)
Glucose-Capillary: 99 mg/dL (ref 65–99)

## 2015-12-07 LAB — HEPARIN LEVEL (UNFRACTIONATED): Heparin Unfractionated: 0.39 IU/mL (ref 0.30–0.70)

## 2015-12-07 LAB — MAGNESIUM: Magnesium: 1.8 mg/dL (ref 1.7–2.4)

## 2015-12-07 LAB — PHOSPHORUS: Phosphorus: 2.5 mg/dL (ref 2.5–4.6)

## 2015-12-07 MED ORDER — TRACE MINERALS CR-CU-MN-SE-ZN 10-1000-500-60 MCG/ML IV SOLN
INTRAVENOUS | Status: AC
  Administered 2015-12-07: 18:00:00 via INTRAVENOUS
  Filled 2015-12-07 (×2): qty 840

## 2015-12-07 MED ORDER — FAT EMULSION 20 % IV EMUL
240.0000 mL | INTRAVENOUS | Status: AC
  Administered 2015-12-07: 240 mL via INTRAVENOUS
  Filled 2015-12-07 (×2): qty 250

## 2015-12-07 MED ORDER — INSULIN ASPART 100 UNIT/ML ~~LOC~~ SOLN
0.0000 [IU] | Freq: Three times a day (TID) | SUBCUTANEOUS | Status: AC
  Administered 2015-12-08 – 2015-12-09 (×5): 1 [IU] via SUBCUTANEOUS

## 2015-12-07 NOTE — Progress Notes (Signed)
Daily Progress Note   Patient Name: Brandi Heath       Date: 12/07/2015 DOB: 1927/03/12  Age: 82 y.o. MRN#: [id number] Attending Physician: Brandi Sells, MD Primary Care Physician: Brandi Homes, MD Admit Date: 11/30/2015  Reason for Consultation/Follow-up: Establishing goals of care  Subjective: Met at bedside with daughter Brandi Heath who is a Company secretary) and the patient to discuss residential hospice vs SNF with hospice.  Ms Brandi Heath is not speaking today but she does nod her head appropriately.  She is eating a few ice chips.  Ms. Brandi Heath sister Brandi Heath went to hospice house and graduated out.  She then returned to hospice house later and passed.   I reminded Ms Brandi Heath that [facility] is where people go to be made comfortable and have a peaceful passing.  There would be no physical therapy or speech therapy there.  Then I asked her how she felt about hospice house.  She closed her eyes and did not respond.  Brandi Heath explained that her response meant "no".  I then asked if she would like to go back to [facility] with Hospice support and Ms. Brandi Heath clearly nodded her head "Yes".  I talked with Ms. Nikiesha about making her comfortable now, but taking out the NG tube, getting the mits and telemetry wires off - Ms. Brandi Heath shook her head "No".  I asked Ms. Brandi Heath if she wanted to return to the hospital if she develops another bowel obstruction - she clearly shook her head "No".  Afterward Brandi Heath and I discussed her poor nutritional status, severe debility and her sacral wound (which will not heal due to nutritional status)  I then spoke further with Dtrs Brandi Heath and Brandi Heath (primary care taker).  The patient is very tired and if her bowel obstruction clears she is so debilitated that she only  has weeks left to live.  If the bowel obstruction does not clear she has days left to live.  Goals at this point: Continue current treatment with N/G, IVF, and close monitoring.  The patient wants to keep going. No surgery. No artificial feeding. We decided on a 48 hour timed trial to monitor for improvement.   Next family meeting Sunday at 3:00 pm.  If no improvement or any decline = residential hospice.  Length of Stay: 7  Current Medications: Scheduled Meds:  . bisacodyl  10 mg Rectal Daily  . chlorhexidine  15 mL Mouth Rinse BID  . levETIRAcetam  750 mg Intravenous Q12H  . lip balm  1 application Topical BID  . mouth rinse  15 mL Mouth Rinse q12n4p  . MUSCLE RUB   Topical QHS  . pantoprazole (PROTONIX) IV  40 mg Intravenous Q12H  . sodium chloride flush  10-40 mL Intracatheter Q12H  . sodium chloride flush  3 mL Intravenous Q12H    Continuous Infusions: . dextrose 5 %-0.9% nacl with kcl 100 mL/hr at 12/07/15 0755  . diltiazem (CARDIZEM) infusion 5 mg/hr (12/06/15 2157)  . heparin 950 Units/hr (12/06/15 1927)    PRN Meds: acetaminophen **OR** acetaminophen, magic mouthwash, menthol-cetylpyridinium, ondansetron **OR** ondansetron (ZOFRAN) IV, phenol, sodium chloride flush  Physical Exam  Constitutional: She appears well-developed.  Frail. Not speaking verbally.  Accepting ice chips.  HENT:  Head: Normocephalic and atraumatic.  Cardiovascular:  Murmur heard. Tachy irreg irreg  Pulmonary/Chest: Effort normal. No respiratory distress. She has no wheezes. She has no rales. She exhibits no tenderness.  Abdominal: Soft. She exhibits no distension and no mass. There is no tenderness. There is no guarding.  N/G clamped.   Bowel sounds not heard.  Musculoskeletal: She exhibits no edema or deformity.  Neurological:  Awake.  Responds to questions by nodding and follows commands.  Skin: Skin is warm and dry. No erythema.    Vital Signs: BP (!) 97/37   Pulse (!) 35   Temp  97.1 F (36.2 C) (Axillary)   Resp 20   Ht 5' 9"  (1.753 m)   Wt 54.2 kg (119 lb 7.8 oz)   SpO2 98%   BMI 17.65 kg/m  SpO2: SpO2: 98 % O2 Device: O2 Device: Not Delivered O2 Flow Rate: O2 Flow Rate (L/min): 2 L/min  Intake/output summary:   Intake/Output Summary (Last 24 hours) at 12/07/15 1303 Last data filed at 12/07/15 0800  Gross per 24 hour  Intake          3748.28 ml  Output             2675 ml  Net          1073.28 ml   LBM: Last BM Date: 12/06/15 Baseline Weight: Weight: 66.2 kg (145 lb 15.1 oz) Most recent weight: Weight: 54.2 kg (119 lb 7.8 oz)       Palliative Assessment/Data:    Flowsheet Rows   Flowsheet Row Most Recent Value  Intake Tab  Referral Department  Hospitalist  Unit at Time of Referral  Intermediate Care Unit  Palliative Care Primary Diagnosis  Other (Comment)  Date Notified  12/04/15  Palliative Care Type  New Palliative care  Reason for referral  Clarify Goals of Care  Date of Admission  11/30/15  Date first seen by Palliative Care  12/04/15  # of days Palliative referral response time  0 Day(s)  # of days IP prior to Palliative referral  4  Clinical Assessment  Palliative Performance Scale Score  20%  Psychosocial & Spiritual Assessment  Palliative Care Outcomes      Patient Active Problem List   Diagnosis Date Noted  . Chronic anticoagulation 12/06/2015  . Encounter for hospice care discussion   . Palliative care encounter   . Goals of care, counseling/discussion   . Protein-calorie malnutrition, severe 12/03/2015  . Pressure injury of skin 12/01/2015  . Acute renal failure (ARF) ([place]) 11/30/2015  . Dehydration 11/30/2015  .  Acute kidney injury ([place]) 11/14/2015  . Non-ischemic cardiomyopathy ([place]) 11/11/2015  . MR (mitral regurgitation) 11/11/2015  . Atrial fibrillation with RVR ([place]) 10/28/2015  . Acute respiratory failure with hypoxia ([place]) 10/17/2015  . Slurred speech   . Left sided numbness   . Acute on chronic  diastolic heart failure ([place]) 10/04/2015  . Normocytic anemia 10/04/2015  . Thrombocytopenia ([place]) 10/04/2015  . TIA (transient ischemic attack) 10/04/2015  . Cough 10/02/2015  . SBO (small bowel obstruction) 10/01/2015  . Chronic CHF (congestive heart failure) ([place]) 09/29/2015  . Venous insufficiency (chronic) (peripheral) 09/29/2015  . Vitamin B12 deficiency 09/29/2015  . Anemia, chronic disease 09/29/2015  . Enterococcus UTI 09/02/2015  . GERD (gastroesophageal reflux disease) 09/02/2015  . Adynamic ileus ([place]) 06/11/2015  . CHF, acute on chronic ([place]) 06/11/2015  . Personal history of DVT (deep vein thrombosis) 06/11/2015  . Non-intractable vomiting with nausea 06/03/2015  . Abdominal aortic aneurysm without rupture ([place]) 05/31/2015  . Jaw pain 05/27/2015  . E. coli UTI 04/22/2015  . Prerenal acute renal failure ([place]) 04/22/2015  . History of CVA (cerebrovascular accident) 04/22/2015  . AF (atrial fibrillation) ([place]) 04/22/2015  . Polyneuropathy ([place]) 04/22/2015  . Edema 01/25/2015  . Atrial fibrillation with rapid ventricular response ([place]) 01/20/2015  . Acute encephalopathy 01/20/2015  . UTI (urinary tract infection) 01/20/2015  . Tracheobronchomalacia 01/20/2015  . Personal history of PE (pulmonary embolism) 01/20/2015  . Depression 01/20/2015  . OSA (obstructive sleep apnea)   . Dementia with behavioral disturbance   . Seizures ([place])   . Hyperlipidemia   . Hypertensive heart disease with CHF (congestive heart failure) ([place])   . Clotting disorder ([place])   . Cerebrovascular accident (CVA) due to thrombosis [facility])     Palliative Care Assessment & Plan   Patient Profile: 82 y.o. 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  Mrs. Roselli has had 4 admits in the last 6 months.  She has been treated  conservatively with bowel rest and an NG tube.  On admission she had acute kidney injury.  Her baseline creatinine is normally 1.0, and is now 1.6.  Her pulse and blood pressure appear labile.  Assessment: Elderly female with recurrent bowel obstruction. Stabilized but her abdomen has not improved significantly .  This is her 4th admission in two months (3 for bowel obs and 1ce for dehdydration & TIA).  She is in a cycle of returning to the hospital receiving artificial hydration and conservative management,  improving and then being discharged into her normal environment where she dehydrates and falters again.   Recommendations/Plan: Continue current treatment with N/G, IVF, and close monitoring.  The patient wants to keep going. No surgery or invasive procedures No artificial feeding. We decided on a 48 hour timed trial to monitor for improvement.   Next family meeting Sunday at 3:00 pm.  If no improvement or any decline = residential hospice.  Goals of Care and Additional Recommendations:  Limitations on Scope of Treatment: Minimize Medications, No Artificial Feeding and No Surgical Procedures  Code Status:  DNR  Prognosis:  Likely weeks given recurrent bowel obstructions, debility, malnutrition, dementia  Discharge Planning:  Hospice facility vs SNF with Hospice (would have to pay for SNF)  Care plan was discussed with [facility] MD, Bedside RN and extended family.  Thank you for allowing  the Palliative Medicine Team to assist in the care of this patient.   Time In: 12:00 Time Out: 1:00 Total Time 60 min. Prolonged Time Billed yes      Greater than 50%  of this time was spent counseling and coordinating care related to the above assessment and plan.  Imogene Burn, PA-C Palliative Medicine   Please contact Palliative MedicineTeam phone at [phone] for questions and concerns.   Please see AMION for individual provider pager numbers.

## 2015-12-07 NOTE — Progress Notes (Signed)
PHARMACY - ADULT TOTAL PARENTERAL NUTRITION CONSULT NOTE   Pharmacy Consult for TPN Indication: Recurrent SBO  Patient Measurements: Height: 5\' 9"  (175.3 cm) Weight: 119 lb 7.8 oz (54.2 kg) IBW/kg (Calculated) : 66.2 TPN AdjBW (KG): 54.2 Body mass index is 17.65 kg/m. Usual Weight: 77 kg  Insulin Requirements: no Novolog in past 24h   Current Nutrition: NPO  IVF: D5NS + 60 KCl @ 100 ml/hr  Central access: PICC 11/19 TPN start date: 12/03/15   ASSESSMENT                                                                                                          HPI: 82 y.o. female with medical history significant of  CAD, dementia, DVT/PE, OSA, AFib on anticoagulation, recurrent SBO, CHF, NICM with mitral regurgitation, seizure disorder, recent stroke; presented with N/V, abd pain, and AMS. Recently admitted for SBO in October which improved with conservative mgmt. NGT currently with high output and remains NPO  Significant events:  11/20: patient signalling for water; appears to be thirsty 11/24: TPN stopped yesterday after family meeting with palliative care, however resume today per discussion with Dr Mahala MenghiniSamtani.  Paged Algis DownsMarianne York, palliative care PA to clarify family desires re: TPN but no return call.     Today:   Glucose - No Hx DM; CBGs at goal <150  Electrolytes - Much improved today and wnl except Cl & HCO3 which are improving  Renal - SCr improving; CrCl ~35 ml/min;   I/O -Good UOP; NG tube output decreased to 500ml  LFTs - albumin sl low; o/w normal  TGs - wnl  Prealbumin - slightly low  NUTRITIONAL GOALS                                                                                             RD recs: Kcal:  1500-1700 Protein:  70-80g Fluid:  1.5-1.7L/day  Clinimix 5/15 at a goal rate of 70 ml/hr + 20% fat emulsion at 10 ml/hr to provide: 84 g/day protein, 1672 Kcal/day.  PLAN  At 1800 today:  Restart Clinimix 5/15 at 62ml/hr  Resume 20% fat emulsion at 10 ml/hr.  TPN to contain standard multivitamins and trace elements; Pepcid 20 mg/day (reduced for renal impairment) added to minimize H+ losses to stomach fluid. Protonix IV BID per MD for same reason.  IVFs per MD, see comments above  Sens SSI with q8 CBG checks  TPN lab panels on Mondays & Thursdays.  Repeat Bmet, Mag, Phos in am.   F/u daily  Junita Push, PharmD, BCPS Pager: [phone] 12/07/2015, 2:18 PM

## 2015-12-07 NOTE — Progress Notes (Signed)
Pt HR sustaining in the 120's to 140's per CCMD.  MD made aware.  Verbal order to increase cardizem drip to 37ml/hr.  Will monitor HR and blood pressure closely.

## 2015-12-07 NOTE — Progress Notes (Signed)
Nutrition Follow-up  DOCUMENTATION CODES:   Severe malnutrition in context of acute illness/injury, Underweight  INTERVENTION:  - Continue NPO status.  - RD will continue to monitor throughout hospitalization.  NUTRITION DIAGNOSIS:   Inadequate oral intake related to inability to eat as evidenced by NPO status. -ongoing  GOAL:   Patient will meet greater than or equal to 90% of their needs -currently unmet with TPN off per GOC.  MONITOR:   Weight trends, Labs, Skin, I & O's, Other (Comment) (GOC)  ASSESSMENT:   82 y.o. female with medical history significant of  CAD, dementia, DVT, OSA, D, atrial fibrillation on anticoagulation, recurrent small bowel obstruction, CHF nonischemic cardiomyopathy with mitral regurgitation, seizure disorder, recent stroke, As with abdominal pain, vomiting and altered mental status.  11/24 Pt remains NPO with NGT in place. Spoke with Pharmacist earlier this AM who reports that TPN has been off since yesterday PM following Pallaitive Care's meeting with pt's daughters. Per Palliative Care note from yesterday at 1243: pt does not desire surgery and family is supportive of this decision; "they do not want artificial feeding." Palliative to meet again today with one of pt's daughters and the pt. Note from yesterday states that pt is potentially eligible for residential hospice. Will continue to monitor POC/GOC. No new weight since 11/18.  Medications reviewed; 10 mg Dulcolax/day, 750 mg IV Keppra BID, PRN Zofran, 40 mg IV Protonix BID, 15 mmol KPhos x1 dose yesterday. Labs reviewed; CBG: 119 mg/dL this AM, BUN: 28 mg/dL, Ca: 8.3 mg/dL, GFR: 58 mL/min.  IVF: D5-NS-60 mEq KCl @ 100 mL/hr (408 kcal).    11/22 - Pt with NGT with 300cc drainage present at time of RD visit earlier this AM.  - Pt remains NPO.  - Palliative Care following pt with last note today at 0753 which states that pt declining surgery; per notes, plan for GOC meeting with daughters  tomorrow (11/23) at 1400.  - Palliative Care note this AM states pt likely to pass <2 weeks if SBO does not resolve.  - No new weight since 11/18. - Pt currently receiving goal rate TPN: Clinimix E 5/15 @ 70 mL/hr with 20% ILE @ 10 mL/hr which is providing 84 grams of protein (105% maximum estimated protein need) and 1673 kcal.  - This regimen is also providing 252 grams of dextrose.  - Talked with Pharmacist this AM and expressed concern about refeeding d/t quick increase to goal rate of TPN, current IVF regimen (D5-30 mEq KCl @ 125 mL/hr which is providing 510 kcal and 150 grams of dextrose), and severe malnutrition.  - Pharmacist spoke with MD after discussion with RD and plan at this time is for adjustment of IVF (outlined below) and continue current TPN regimen.  - Pt previously receiving 402 grams of dextrose/day and with change will now receive 372 grams of dextrose/day. - If TPN to be decreased to Clinimix E 5/15 @ 40 mL/hr and continued order for new IVF regimen, she would receive 264 grams of dextrose/day.  IVF: D5-NS-60 mEq KCl @ 100 mL/hr (408 kcal).   11/21 - Pt remains NPO.  - Pt with dementia and no family/visitors at bedside.  - Spoke with RN who reports NGT output of 2L overnight; canister currently with ~150cc and RN reports that this has been over the past 5 hours.  - Pt with SBO. Per MD note this AM, Surgery has stated that pt is a very poor surgical candidate. - Also pt with acute encephalopathy which  MD note states is likely "metabolic in the setting of severe dehydration, UTI, and ARF."  Pt currently receiving Clinimix E 5/15 @ 40 mL/hr with 20% ILE @ 8 mL/hr. This regimen is providing 48 grams of protein (68.5% minimum estimated protein need) and 1066 kcal (71% minimum estimated kcal need). Spoke with Pharmacist this AM about altered lytes and relationship with high NGT output, repletion ordered. See Pharmacy note from today at 0745 related to TPN, electrolytes, and IVF  recommendations.   Goal TPN (to start tonight):Clinimix E 5/15 @ 70 mL/hr with 20% ILE @ 10 mL/hr which will provide 84 grams of protein (105% maximum estimated protein need) and 1673 kcal.   At time of RD visit, BP: 81/48 and MAP: 58. IVF: D5-30 mEq KCl @ 125 mL/hr (612 kcal).    Diet Order:  Diet NPO time specified  Skin:  Wound (see comment) (Unstageable, full thickness sacral pressure injury)  Last BM:  11/23  Height:   Ht Readings from Last 1 Encounters:  12/01/15 5\' 9"  (1.753 m)    Weight:   Wt Readings from Last 1 Encounters:  12/01/15 119 lb 7.8 oz (54.2 kg)    Ideal Body Weight:  65.9 kg  BMI:  Body mass index is 17.65 kg/m.  Estimated Nutritional Needs:   Kcal:  1500-1700  Protein:  70-80g  Fluid:  1.5-1.7L/day  EDUCATION NEEDS:   No education needs identified at this time    Trenton Gammon, MS, RD, LDN, CNSC Inpatient Clinical Dietitian Pager # [phone] After hours/weekend pager # [phone]

## 2015-12-07 NOTE — Progress Notes (Signed)
ANTICOAGULATION CONSULT NOTE - Follow Up Consult  Pharmacy Consult for Heparin Indication: atrial fibrillation  Patient Measurements: Height: 5\' 9"  (175.3 cm) Weight: 119 lb 7.8 oz (54.2 kg) IBW/kg (Calculated) : 66.2 Heparin Dosing Weight: actual weight  Medications:  Infusions:  . dextrose 5 %-0.9% nacl with kcl 100 mL/hr at 12/07/15 0755  . diltiazem (CARDIZEM) infusion 5 mg/hr (12/06/15 2157)  . heparin 950 Units/hr (12/06/15 1927)    Assessment: 56 yoF admitted with UTI/sepsis. PMH significant for AFib and patient is on oral anticoagulation PTA with Eliquis, held for NPO status.  Pharmacy consulted to dose IV heparin for AFib, while Eliquis is on hold.  Today, 12/07/15  Heparin therapeutic @ 950 units/hr  CBC- Hg continues to decrease, pltc stable ~ 150  CrCl ~28ml/min, improving  No bleeding or infusion issues noted  Goal of Therapy:  Heparin level 0.3-0.7 Monitor platelets by anticoagulation protocol: Yes   Plan:   Continue IV heparin rate to 950 units/hr  Daily heparin level and CBC  Monitor for signs of bleeding or thrombosis  Junita Push, PharmD, BCPS Pager: [phone] 12/07/2015, 1:11 PM

## 2015-12-07 NOTE — Progress Notes (Signed)
Brandi Heath QRF:[id number] DOB: 1927/05/27 DOA: 11/30/2015 PCP: Inocencio Homes, MD  Brief narrative:  82 y/o ? SNF [[facility]?] Recurrent SBO  Admission 11.1-->11.2.17 for CVA--turned out to be thought more 2/2 to [facility]  Admission 10/15-->10/18 for SBO-non op management  Admission 9/18-->9.25 SBO/Stroke/AEDCHF [full work-up performed then] H/o Afib on CHr AC=Chad2Vasc2=8--diagnosed ~ 2016 Dr. [facility] [no records available] H/o DVT/PE MOD-severe dementia on meds Sz disorder on Phenytoin OSA reported Depression on Meds  Admitted 11/20 with  High output SBO Hypernatreami sodd 152, K 2.3 Severe met alkaolsis Co4 46 Hypomagnesemia/hypophosphatemia/hypocalcemia  Placed on TPN, D5 + K 30 and loaded with Keppra On Cardizem and heparin Gtt  Discussions have been ongoing with palliative care as well as general surgery and patient has made marginal improvement but still is nothing by mouth    Past medical history-As per Problem list Consultants: Gen surgery consulted Palliative care following  Procedures:  None yet  Antibiotics:  Levaquin   Subjective   Awake and alert Can tell me orienting information and demographics however not willing to engage at bedside with me regarding goals of care I asked her if she would be willing to surgery or if she understands what would happen if she doesn't have surgery and she voices morning that she did She does not answer clearly regarding end-of-life discussions when I saw her Her NG output has not been measured yet for the day but appears to have decreased over the past day and she hasn't positive balance I am so than normal and plan    Objective    Interim History:   Telemetry: Sinus tach/A. fib 120   Objective: Vitals:   12/07/15 0500 12/07/15 0600 12/07/15 0800 12/07/15 1200  BP: (!) 91/51 (!) 97/37    Pulse:      Resp: 10 20    Temp:   (!) 96.8 F (36 C) 97.1 F (36.2 C)  TempSrc:   Axillary  Axillary  SpO2:      Weight:      Height:        Intake/Output Summary (Last 24 hours) at 12/07/15 1403 Last data filed at 12/07/15 0800  Gross per 24 hour  Intake          3748.28 ml  Output             2675 ml  Net          1073.28 ml    Exam:  General: Alert pleasant oriented, some weakness noted  no apparent distress, NG tube is in situ Cardiovascular: S1 and S2 tachycardic Respiratory: Clinically clear no added sound Abdomen: Soft nontender no rebound no guarding Skin no lower extremity edema no rash Neuro intact  Data Reviewed: Basic Metabolic Panel:  Recent Labs Lab 12/03/15 0645 12/04/15 0516 12/05/15 0433 12/06/15 0446 12/07/15 0434  NA 153* 151* 134* 136 137  K 2.9* 3.2* 3.2* 3.8 4.8  CL 99* 86* 87* 99* 107  CO2 46* >50* 39* 31 26  GLUCOSE 163* 162* 151* 127* 122*  BUN 35* 29* 32* 34* 28*  CREATININE 1.60* 1.60* 1.26* 1.13* 0.99  CALCIUM 9.4 9.7 8.5* 8.4* 8.3*  MG 2.4 1.9 1.3* 1.7 1.8  PHOS 2.1* 2.5 1.2* 2.4* 2.5   Liver Function Tests:  Recent Labs Lab 11/30/15 1838 12/01/15 0316 12/03/15 0645 12/06/15 0446 12/07/15 0434  AST 24 22 18 25 18   ALT 16 13* 12* 22 18  ALKPHOS 86 62 59 49 50  BILITOT  0.8 0.7 0.4 0.6 0.6  PROT 8.5* 6.7 6.8 5.7* 5.7*  ALBUMIN 4.9 3.3* 3.3* 2.8* 2.8*    Recent Labs Lab 11/30/15 1838 12/03/15 0645  LIPASE 121* 37   No results for input(s): AMMONIA in the last 168 hours. CBC:  Recent Labs Lab 11/30/15 1838  12/03/15 0645 12/04/15 0516 12/05/15 0433 12/06/15 0446 12/07/15 0434  WBC 10.4  < > 9.4 11.1* 11.8* 9.3 6.5  NEUTROABS 8.5*  --   --   --   --   --   --   HGB 14.3  < > 11.1* 11.6* 9.8* 8.8* 8.6*  HCT 41.5  < > 34.5* 35.8* 29.1* 25.6* 24.6*  MCV 83.5  < > 87.3 86.9 84.3 81.5 82.6  PLT 279  < > 223 220 158 152 157  < > = values in this interval not displayed. Cardiac Enzymes:  Recent Labs Lab 11/30/15 2212 12/01/15 0316 12/01/15 0755 12/02/15 0621  TROPONINI 0.05* 0.10* 0.12* 0.07*    BNP: Invalid input(s): POCBNP CBG:  Recent Labs Lab 12/06/15 1157 12/06/15 1728 12/06/15 2026 12/06/15 2333 12/07/15 0424  GLUCAP 121* 106* 112* 103* 119*    Recent Results (from the past 240 hour(s))  Urine culture     Status: Abnormal   Collection Time: 11/30/15  7:59 PM  Result Value Ref Range Status   Specimen Description URINE, CATHETERIZED  Final   Special Requests NONE  Final   Culture 40,000 COLONIES/mL PROTEUS MIRABILIS (A)  Final   Report Status 12/03/2015 FINAL  Final   Organism ID, Bacteria PROTEUS MIRABILIS (A)  Final      Susceptibility   Proteus mirabilis - MIC*    AMPICILLIN >=32 RESISTANT Resistant     CEFAZOLIN <=4 SENSITIVE Sensitive     CEFTRIAXONE <=1 SENSITIVE Sensitive     CIPROFLOXACIN >=4 RESISTANT Resistant     GENTAMICIN <=1 SENSITIVE Sensitive     IMIPENEM 2 SENSITIVE Sensitive     NITROFURANTOIN 256 RESISTANT Resistant     TRIMETH/SULFA >=320 RESISTANT Resistant     AMPICILLIN/SULBACTAM 16 INTERMEDIATE Intermediate     PIP/TAZO <=4 SENSITIVE Sensitive     * 40,000 COLONIES/mL PROTEUS MIRABILIS  MRSA PCR Screening     Status: None   Collection Time: 11/30/15 11:46 PM  Result Value Ref Range Status   MRSA by PCR NEGATIVE NEGATIVE Final    Comment:        The GeneXpert MRSA Assay (FDA approved for NASAL specimens only), is one component of a comprehensive MRSA colonization surveillance program. It is not intended to diagnose MRSA infection nor to guide or monitor treatment for MRSA infections.      Studies:              All Imaging reviewed and is as per above notation   Scheduled Meds: . bisacodyl  10 mg Rectal Daily  . chlorhexidine  15 mL Mouth Rinse BID  . levETIRAcetam  750 mg Intravenous Q12H  . lip balm  1 application Topical BID  . mouth rinse  15 mL Mouth Rinse q12n4p  . MUSCLE RUB   Topical QHS  . pantoprazole (PROTONIX) IV  40 mg Intravenous Q12H  . sodium chloride flush  10-40 mL Intracatheter Q12H  . sodium  chloride flush  3 mL Intravenous Q12H   Continuous Infusions: . dextrose 5 %-0.9% nacl with kcl 100 mL/hr at 12/07/15 0755  . diltiazem (CARDIZEM) infusion 5 mg/hr (12/06/15 2157)  . heparin 950 Units/hr (12/06/15 1927)  Assessment/Plan:   High output SBO-palliative care has been consulted and general surgery as well has been consulted  Patients family  and palliative concur palliative trajectory and surgery thinks surgery needed only if dire need TPN management per pharmacy to be resumed till it is crystal clear that patient has made a firm decision X-rays show stable bowel gas pattern  Atrial fibrillation with RVR Chad2Vasc2= 8 previously on Elliquis Continue heparin GTT--will make decisions regard this and absolutely indicated meds in 24 hrs Resume Cardizem GTT -home medication 360 every 24 hourly When necessary metoprolol 5 mg every 6 hours-home medication 100  twice a day  Altered electrolytes Risk for re-feeding syndrome Continue TPN  Change to D5 to D5/normal saline as significant hypernatremia on 12/03/09/20 is now resolved At home on magnesium oxide 400 daily as well as potassium chloride 20 mEq daily and is also on Lasix 40 daily  Compensated chronic diastolic heart failure See above discussion IV fluids going because of kidney injury  Seizure disorder previously on phenytoin Loaded with Keppra this admission  Acute kidney injury Patient on admission had BUN/creatinine 60/3.4 and significant metabolic alkalosis --> 40/9.8 continue IV fluid at above rate  Ascitic anemia Likely secondary to chronic disease and illness  Long discussion with daughter Lynelle Smoke on telephone and there is no significant 1 health care power of attorney and family makes decisions as a group The patient herself is reluctant to engage in make medical decisions She cannot tell us specifics as to if she would want hospice or not but has indicated to palliative care that she would be okay  with IV fluids and holding the course and we will revisit this Patient seems stable at this time for transfer to telemetry on Cardizem drip we will resume the TPN had been held and we will reassess her going forward  Verneita Griffes, MD  [facility] Pager [phone] 12/07/2015, 2:03 PM    LOS: 7 days

## 2015-12-07 NOTE — Progress Notes (Signed)
Subjective: Denies abd pain. [facility]   Objective: Vital signs in last 24 hours: Temp:  [95.2 F (35.1 C)-98.7 F (37.1 C)] 96.8 F (36 C) (11/24 0800) Resp:  [9-20] 20 (11/24 0600) BP: (85-110)/(37-60) 97/37 (11/24 0600) Last BM Date: 12/06/15  Intake/Output from previous day: 11/23 0701 - 11/24 0700 In: 2296.8 [I.V.:2296.8] Out: 3075 [Urine:2575; Emesis/NG output:500] Intake/Output this shift: Total I/O In: 1451.5 [I.V.:1129; IV Piggyback:322.5] Out: -   Not too talkative today Soft, nt, nd  Lab Results:   Recent Labs  12/06/15 0446 12/07/15 0434  WBC 9.3 6.5  HGB 8.8* 8.6*  HCT 25.6* 24.6*  PLT 152 157   BMET  Recent Labs  12/06/15 0446 12/07/15 0434  NA 136 137  K 3.8 4.8  CL 99* 107  CO2 31 26  GLUCOSE 127* 122*  BUN 34* 28*  CREATININE 1.13* 0.99  CALCIUM 8.4* 8.3*   PT/INR No results for input(s): LABPROT, INR in the last 72 hours. ABG No results for input(s): PHART, HCO3 in the last 72 hours.  Invalid input(s): PCO2, PO2  Studies/Results: Dg Abd 2 Views  Result Date: 12/07/2015 CLINICAL DATA:  Small bowel obstruction. EXAM: ABDOMEN - 2 VIEW COMPARISON:  Single-view of the abdomen 12/02/2015 and 12/06/2015. FINDINGS: NG tube remains in place. Mild gaseous distention of a loop of distal small bowel at 3.2 cm is unchanged. No free intraperitoneal air is identified. Cholecystectomy clips, aneurysm coils and aortoiliac stent graft early again seen. IMPRESSION: No marked change in mild gas distention of small bowel since the most recent examination compatible with small-bowel obstruction. Electronically Signed   By: Drusilla Kannerhomas  Dalessio M.D.   On: 12/07/2015 10:12   Dg Abd Portable 1v  Result Date: 12/06/2015 CLINICAL DATA:  Follow up partial small bowel obstruction. EXAM: PORTABLE ABDOMEN - 1 VIEW COMPARISON:  12/02/2015 dating back to 10/01/2015, including CT abdomen and pelvis 11/30/2015 and 10/01/2015. FINDINGS: Nasogastric tube tip  projects over the expected location of the midbody of a J-shaped stomach, unchanged. Several dilated loops of small bowel persist in the abdomen and pelvis, though these loops are less distended than on the examination 4 days ago. Gas and stool is present in the decompressed colon. No suggestion of free air on the supine image. Prior aorto bi-iliac stent grafting for the previously identified abdominal aortic aneurysm. IVC filter in the lower IVC. Prior embolization of the right internal iliac artery. IMPRESSION: Persistent partial small bowel obstruction which is minimally improved since examination 4 days ago. Electronically Signed   By: Hulan Saashomas  Lawrence M.D.   On: 12/06/2015 10:56    Anti-infectives: Anti-infectives    Start     Dose/Rate Route Frequency Ordered Stop   12/03/15 0830  fluconazole (DIFLUCAN) tablet 150 mg     150 mg Oral  Once 12/03/15 0828 12/03/15 0859   12/02/15 2200  levofloxacin (LEVAQUIN) IVPB 500 mg  Status:  Discontinued     500 mg 100 mL/hr over 60 Minutes Intravenous Every 48 hours 11/30/15 2202 12/03/15 1036   11/30/15 2230  aztreonam (AZACTAM) 500 mg in dextrose 5 % 50 mL IVPB  Status:  Discontinued     500 mg 100 mL/hr over 30 Minutes Intravenous Every 8 hours 11/30/15 2203 12/01/15 0732   11/30/15 2145  levofloxacin (LEVAQUIN) IVPB 750 mg     750 mg 100 mL/hr over 90 Minutes Intravenous  Once 11/30/15 2130 11/30/15 2327   11/30/15 2145  aztreonam (AZACTAM) 2 g in dextrose 5 % 50  mL IVPB  Status:  Discontinued     2 g 100 mL/hr over 30 Minutes Intravenous  Once 11/30/15 2130 11/30/15 2203      Assessment/Plan: s/p * No surgery found * SBO Dementia AKI Atrial fibrillation with RVR Cardiomyopathy H/o dvt/pe Recent TIA vs seizure  sbo appears perhaps a little better. Output past 2 days 500cc/day. Plain film a little better. Will clamp NG. Pt can have some ice chips.   Discussed sbo with daughter. Discussed that if bowel obstruction didn't get better  would they want surgery she said no. She said it was up to her mom and her mom doesn't want surgery. Pt was ox4 yesterday and verbalized same wishes.   [name], MD, FACS General, Bariatric, & Minimally Invasive Surgery [facility], [place]   LOS: 7 days    Brandi Heath, Brandi Heath 12/07/2015

## 2015-12-08 LAB — CBC
HEMATOCRIT: 27.2 % — AB (ref 36.0–46.0)
Hemoglobin: 9.4 g/dL — ABNORMAL LOW (ref 12.0–15.0)
MCH: 28.7 pg (ref 26.0–34.0)
MCHC: 34.6 g/dL (ref 30.0–36.0)
MCV: 82.9 fL (ref 78.0–100.0)
Platelets: 205 10*3/uL (ref 150–400)
RBC: 3.28 MIL/uL — ABNORMAL LOW (ref 3.87–5.11)
RDW: 14.6 % (ref 11.5–15.5)
WBC: 8 10*3/uL (ref 4.0–10.5)

## 2015-12-08 LAB — BASIC METABOLIC PANEL
ANION GAP: 5 (ref 5–15)
BUN: 19 mg/dL (ref 6–20)
CALCIUM: 8.9 mg/dL (ref 8.9–10.3)
CO2: 24 mmol/L (ref 22–32)
Chloride: 110 mmol/L (ref 101–111)
Creatinine, Ser: 1.01 mg/dL — ABNORMAL HIGH (ref 0.44–1.00)
GFR calc Af Amer: 57 mL/min — ABNORMAL LOW (ref >60)
GFR, EST NON AFRICAN AMERICAN: 49 mL/min — AB (ref >60)
Glucose, Bld: 136 mg/dL — ABNORMAL HIGH (ref 65–99)
POTASSIUM: 4.9 mmol/L (ref 3.5–5.1)
SODIUM: 139 mmol/L (ref 135–145)

## 2015-12-08 LAB — CBC WITH DIFFERENTIAL/PLATELET
BASOS ABS: 0 10*3/uL (ref 0.0–0.1)
BASOS PCT: 0 %
EOS ABS: 0.1 10*3/uL (ref 0.0–0.7)
EOS PCT: 1 %
HCT: 26.4 % — ABNORMAL LOW (ref 36.0–46.0)
Hemoglobin: 9.1 g/dL — ABNORMAL LOW (ref 12.0–15.0)
LYMPHS PCT: 16 %
Lymphs Abs: 1.2 10*3/uL (ref 0.7–4.0)
MCH: 28.3 pg (ref 26.0–34.0)
MCHC: 34.5 g/dL (ref 30.0–36.0)
MCV: 82.2 fL (ref 78.0–100.0)
MONO ABS: 0.7 10*3/uL (ref 0.1–1.0)
Monocytes Relative: 10 %
Neutro Abs: 5.3 10*3/uL (ref 1.7–7.7)
Neutrophils Relative %: 73 %
PLATELETS: 204 10*3/uL (ref 150–400)
RBC: 3.21 MIL/uL — AB (ref 3.87–5.11)
RDW: 14.5 % (ref 11.5–15.5)
WBC: 7.2 10*3/uL (ref 4.0–10.5)

## 2015-12-08 LAB — GLUCOSE, CAPILLARY
GLUCOSE-CAPILLARY: 131 mg/dL — AB (ref 65–99)
GLUCOSE-CAPILLARY: 136 mg/dL — AB (ref 65–99)
GLUCOSE-CAPILLARY: 129 mg/dL — AB (ref 65–99)
Glucose-Capillary: 103 mg/dL — ABNORMAL HIGH (ref 65–99)

## 2015-12-08 LAB — MAGNESIUM: MAGNESIUM: 1.6 mg/dL — AB (ref 1.7–2.4)

## 2015-12-08 LAB — HEPARIN LEVEL (UNFRACTIONATED)
HEPARIN UNFRACTIONATED: 0.45 [IU]/mL (ref 0.30–0.70)
Heparin Unfractionated: 0.28 IU/mL — ABNORMAL LOW (ref 0.30–0.70)

## 2015-12-08 LAB — PHOSPHORUS: PHOSPHORUS: 2.3 mg/dL — AB (ref 2.5–4.6)

## 2015-12-08 MED ORDER — FAT EMULSION 20 % IV EMUL
240.0000 mL | INTRAVENOUS | Status: DC
  Administered 2015-12-08: 240 mL via INTRAVENOUS
  Filled 2015-12-08: qty 250

## 2015-12-08 MED ORDER — MAGNESIUM SULFATE IN D5W 1-5 GM/100ML-% IV SOLN
1.0000 g | Freq: Once | INTRAVENOUS | Status: AC
  Administered 2015-12-08: 1 g via INTRAVENOUS
  Filled 2015-12-08: qty 100

## 2015-12-08 MED ORDER — SODIUM PHOSPHATES 45 MMOLE/15ML IV SOLN
10.0000 mmol | Freq: Once | INTRAVENOUS | Status: AC
  Administered 2015-12-08: 10 mmol via INTRAVENOUS
  Filled 2015-12-08: qty 3.33

## 2015-12-08 MED ORDER — TRACE MINERALS CR-CU-MN-SE-ZN 10-1000-500-60 MCG/ML IV SOLN
INTRAVENOUS | Status: DC
  Administered 2015-12-08: 17:00:00 via INTRAVENOUS
  Filled 2015-12-08: qty 840

## 2015-12-08 NOTE — Progress Notes (Signed)
Brandi Heath BXI:[id number] DOB: 1927/12/19 DOA: 11/30/2015 PCP: Brandi Homes, MD  Brief narrative:  82 y/o ? SNF [[facility]] Recurrent SBO  Admission 11.1-->11.2.17 for CVA--turned out to be [facility]  Admission 10/15-->10/18 for SBO-non op management  Admission 9/18-->9.25 SBO/Stroke/AEDCHF [full work-up performed then] H/o Afib on CHr AC=Chad2Vasc2=8--diagnosed ~ 2016 Dr. [facility] [no records available] H/o DVT/PE MOD-severe dementia on meds Sz disorder on Phenytoin OSA reported Depression on Meds  Admitted 11/20 with  High output SBO Hypernatreami sodd 152, K 2.3 Severe met alkaolsis Co4 46 Hypomagnesemia/hypophosphatemia/hypocalcemia  Placed on TPN, D5 + K 30 and loaded with Keppra On Cardizem and heparin Gtt  Discussions have been ongoing with palliative care as well as general surgery and patient has made marginal improvement but still is nothing by mouth  Finally started Lasix stool 12/07/2005 [facility] and currently we're waiting on palliative input and eventually once this occurs we can talk about disposition    Past medical history-As per Problem list Consultants: Gen surgery consulted Palliative care following  Procedures:  None yet  Antibiotics:  Levaquin   Subjective   Awake and alert Relating she is better Daughter at [facility] ewith q's which are answered Overall much improved!   Objective    Interim History:   Telemetry: Sinus tach/A. fib 120   Objective: Vitals:   12/07/15 2123 12/07/15 2336 12/08/15 0417 12/08/15 1446  BP: 113/60 101/66 122/86 113/63  Pulse: 77 83 61 68  Resp: 12  16   Temp: 98.3 F (36.8 C)  97.7 F (36.5 C) 97.7 F (36.5 C)  TempSrc: Oral  Axillary Axillary  SpO2: 100%  100% 100%  Weight:   61.4 kg (135 lb 5.8 oz)   Height:        Intake/Output Summary (Last 24 hours) at 12/08/15 1534 Last data filed at 12/08/15 1448  Gross per 24 hour  Intake          3588.09 ml    Output             1551 ml  Net          2037.09 ml    Exam:  General: Alert pleasant oriented, some weakness noted-no longer sleepy  no apparent distress, NG tube is in situ Cardiovascular: S1 and S2 tachycardic Respiratory: Clinically clear no added sound Abdomen: Soft nontender no rebound no guarding Skin no lower extremity edema no rash Neuro intact  Data Reviewed: Basic Metabolic Panel:  Recent Labs Lab 12/04/15 0516 12/05/15 0433 12/06/15 0446 12/07/15 0434 12/08/15 0512  NA 151* 134* 136 137 139  K 3.2* 3.2* 3.8 4.8 4.9  CL 86* 87* 99* 107 110  CO2 >50* 39* _0 GLUCOSE 162* 151* 127* 122* 136*  BUN 29* 32* 34* 28* 19  CREATININE 1.60* 1.26* 1.13* 0.99 1.01*  CALCIUM 9.7 8.5* 8.4* 8.3* 8.9  MG 1.9 1.3* 1.7 1.8 1.6*  PHOS 2.5 1.2* 2.4* 2.5 2.3*   Liver Function Tests:  Recent Labs Lab 12/03/15 0645 12/06/15 0446 12/07/15 0434  AST _1 ALT 12* 22 18  ALKPHOS 59 49 50  BILITOT 0.4 0.6 0.6  PROT 6.8 5.7* 5.7*  ALBUMIN 3.3* 2.8* 2.8*    Recent Labs Lab 12/03/15 0645  LIPASE 37   No results for input(s): AMMONIA in the last 168 hours. CBC:  Recent Labs Lab 12/04/15 0516 12/05/15 0433 12/06/15 0446 12/07/15 0434 12/08/15 0512  WBC 11.1* 11.8* 9.3 6.5  8.0  HGB 11.6* 9.8* 8.8* 8.6* 9.4*  HCT 35.8* 29.1* 25.6* 24.6* 27.2*  MCV 86.9 84.3 81.5 82.6 82.9  PLT 220 158 152 157 205   Cardiac Enzymes:  Recent Labs Lab 12/02/15 0621  TROPONINI 0.07*   BNP: Invalid input(s): POCBNP CBG:  Recent Labs Lab 12/06/15 2333 12/07/15 0424 12/07/15 1721 12/08/15 0106 12/08/15 0803  GLUCAP 103* 119* 99 131* 136*    Recent Results (from the past 240 hour(s))  Urine culture     Status: Abnormal   Collection Time: 11/30/15  7:59 PM  Result Value Ref Range Status   Specimen Description URINE, CATHETERIZED  Final   Special Requests NONE  Final   Culture 40,000 COLONIES/mL PROTEUS MIRABILIS (A)  Final   Report Status 12/03/2015 FINAL   Final   Organism ID, Bacteria PROTEUS MIRABILIS (A)  Final      Susceptibility   Proteus mirabilis - MIC*    AMPICILLIN >=32 RESISTANT Resistant     CEFAZOLIN <=4 SENSITIVE Sensitive     CEFTRIAXONE <=1 SENSITIVE Sensitive     CIPROFLOXACIN >=4 RESISTANT Resistant     GENTAMICIN <=1 SENSITIVE Sensitive     IMIPENEM 2 SENSITIVE Sensitive     NITROFURANTOIN 256 RESISTANT Resistant     TRIMETH/SULFA >=320 RESISTANT Resistant     AMPICILLIN/SULBACTAM 16 INTERMEDIATE Intermediate     PIP/TAZO <=4 SENSITIVE Sensitive     * 40,000 COLONIES/mL PROTEUS MIRABILIS  MRSA PCR Screening     Status: None   Collection Time: 11/30/15 11:46 PM  Result Value Ref Range Status   MRSA by PCR NEGATIVE NEGATIVE Final    Comment:        The GeneXpert MRSA Assay (FDA approved for NASAL specimens only), is one component of a comprehensive MRSA colonization surveillance program. It is not intended to diagnose MRSA infection nor to guide or monitor treatment for MRSA infections.      Studies:              All Imaging reviewed and is as per above notation   Scheduled Meds: . bisacodyl  10 mg Rectal Daily  . chlorhexidine  15 mL Mouth Rinse BID  . insulin aspart  0-9 Units Subcutaneous Q8H  . levETIRAcetam  750 mg Intravenous Q12H  . lip balm  1 application Topical BID  . mouth rinse  15 mL Mouth Rinse q12n4p  . MUSCLE RUB   Topical QHS  . pantoprazole (PROTONIX) IV  40 mg Intravenous Q12H  . sodium chloride flush  10-40 mL Intracatheter Q12H  . sodium chloride flush  3 mL Intravenous Q12H  . sodium phosphate  Dextrose 5% IVPB  10 mmol Intravenous Once   Continuous Infusions: . dextrose 5 %-0.9% nacl with kcl 100 mL/hr at 12/07/15 1906  . diltiazem (CARDIZEM) infusion 10 mg/hr (12/08/15 1234)  . TPN (CLINIMIX) Adult without lytes 35 mL/hr at 12/07/15 1812   And  . fat emulsion 240 mL (12/07/15 1812)  . TPN (CLINIMIX) Adult without lytes     And  . fat emulsion    . heparin 1,050 Units/hr  (12/08/15 0752)        Assessment/Plan:   High output SBO-palliative care has been consulted and general surgery as well has been consulted  dispo pending pallitve re-visit am Attempted to address scenario of small bowel obstruction recurring for the fourth time and what patient would want and she is undecided and I have encouraged daughter and patient discusses clearly so that we  can make a durable ongoing decision as an outpatient line repeat x-ray tomorrow if no stool by tomorrow If tolerating by mouth, discontinue TPN a.m.  Atrial fibrillation with RVR Chad2Vasc2= 8 previously on Elliquis Continue heparin GTT--will make decisions regard this and absolutely indicated meds in 24 hrs Resume Cardizem GTT -home medication 360 every 24 hourly When necessary metoprolol 5 mg every 6 hours-home medication 100  twice a day We will eventually resume oral medications once it can be clear no further vomiting or obstructive-like physiology  Altered electrolytes Risk for re-feeding syndrome Continue TPN   D5/normal saline rate down from 100 to 50 tolerating by mouth hypernatremia on 12/03/09/20 is now resolved  At home on magnesium oxide 400 daily as well as potassium chloride 20 mEq daily and is also on Lasix 40 daily  Compensated chronic diastolic heart failure See above discussion IV fluids going because of kidney injury  Seizure disorder previously on phenytoin Loaded with Keppra this admission  Acute kidney injury Patient on admission had BUN/creatinine 60/3.4 and significant metabolic alkalosis --> 33/8.3-->29/1.9 continue IV fluid at above rate  Ascitic anemia Likely secondary to chronic disease and illness  Long discussion with daughter Lynelle Smoke on telephone and there is no significant 1 health care power of attorney and family makes decisions as a group Palliative care input After final decision making probable discharge 1127-dependent on decisions   [facility] Pager [phone] 12/08/2015, 3:34 PM    LOS: 8 days

## 2015-12-08 NOTE — Progress Notes (Signed)
Subjective: Tolerated NG tube clamp. No reported vomiting. I was only able to suction out all 150 mL of light green contents from her NG tube this morning. Patient had documented several bowel movements. Denies abdominal pain  Objective: Vital signs in last 24 hours: Temp:  [97.7 F (36.5 C)-98.6 F (37 C)] 97.7 F (36.5 C) (11/25 0417) Pulse Rate:  [61-130] 61 (11/25 0417) Resp:  [12-20] 16 (11/25 0417) BP: (101-127)/(56-86) 122/86 (11/25 0417) SpO2:  [99 %-100 %] 100 % (11/25 0417) Weight:  [60.9 kg (134 lb 4.8 oz)-61.4 kg (135 lb 5.8 oz)] 61.4 kg (135 lb 5.8 oz) (11/25 0417) Last BM Date: 12/07/15  Intake/Output from previous day: 11/24 0701 - 11/25 0700 In: 4679.6 [I.V.:4249.6; IV Piggyback:430] Out: 851 [Urine:700; Emesis/NG output:150; Stool:1] Intake/Output this shift: No intake/output data recorded.  Awake, conversive, Soft, nontender, nondistended  Lab Results:   Recent Labs  12/07/15 0434 12/08/15 0512  WBC 6.5 8.0  HGB 8.6* 9.4*  HCT 24.6* 27.2*  PLT 157 205   BMET  Recent Labs  12/07/15 0434 12/08/15 0512  NA 137 139  K 4.8 4.9  CL 107 110  CO2 26 24  GLUCOSE 122* 136*  BUN 28* 19  CREATININE 0.99 1.01*  CALCIUM 8.3* 8.9   PT/INR No results for input(s): LABPROT, INR in the last 72 hours. ABG No results for input(s): PHART, HCO3 in the last 72 hours.  Invalid input(s): PCO2, PO2  Studies/Results: Dg Abd 2 Views  Result Date: 12/07/2015 CLINICAL DATA:  Small bowel obstruction. EXAM: ABDOMEN - 2 VIEW COMPARISON:  Single-view of the abdomen 12/02/2015 and 12/06/2015. FINDINGS: NG tube remains in place. Mild gaseous distention of a loop of distal small bowel at 3.2 cm is unchanged. No free intraperitoneal air is identified. Cholecystectomy clips, aneurysm coils and aortoiliac stent graft early again seen. IMPRESSION: No marked change in mild gas distention of small bowel since the most recent examination compatible with small-bowel  obstruction. Electronically Signed   By: Drusilla Kanner M.D.   On: 12/07/2015 10:12    Anti-infectives: Anti-infectives    Start     Dose/Rate Route Frequency Ordered Stop   12/03/15 0830  fluconazole (DIFLUCAN) tablet 150 mg     150 mg Oral  Once 12/03/15 0828 12/03/15 0859   12/02/15 2200  levofloxacin (LEVAQUIN) IVPB 500 mg  Status:  Discontinued     500 mg 100 mL/hr over 60 Minutes Intravenous Every 48 hours 11/30/15 2202 12/03/15 1036   11/30/15 2230  aztreonam (AZACTAM) 500 mg in dextrose 5 % 50 mL IVPB  Status:  Discontinued     500 mg 100 mL/hr over 30 Minutes Intravenous Every 8 hours 11/30/15 2203 12/01/15 0732   11/30/15 2145  levofloxacin (LEVAQUIN) IVPB 750 mg     750 mg 100 mL/hr over 90 Minutes Intravenous  Once 11/30/15 2130 11/30/15 2327   11/30/15 2145  aztreonam (AZACTAM) 2 g in dextrose 5 % 50 mL IVPB  Status:  Discontinued     2 g 100 mL/hr over 30 Minutes Intravenous  Once 11/30/15 2130 11/30/15 2203      Assessment/Plan: s/p * No surgery found * SBO Dementia AKI Atrial fibrillation with RVR Cardiomyopathy H/o dvt/pe Recent TIA vs seizure  Patient tolerated planned. Has had several bowel movements. Will DC nasogastric tube and patient can have clear liquids as well as protein shakes  [name], MD, FACS General, Bariatric, & Minimally Invasive Surgery [facility] [place]   LOS: 8  days    Atilano InaWILSON,Katia Hannen M 12/08/2015

## 2015-12-08 NOTE — Progress Notes (Signed)
ANTICOAGULATION CONSULT NOTE - Follow Up Consult  Pharmacy Consult for Heparin Indication: atrial fibrillation  Patient Measurements: Height: 5\' 9"  (175.3 cm) Weight: 135 lb 5.8 oz (61.4 kg) IBW/kg (Calculated) : 66.2 Heparin Dosing Weight: actual weight  Medications:  Infusions:  . dextrose 5 %-0.9% nacl with kcl 100 mL/hr at 12/07/15 1906  . diltiazem (CARDIZEM) infusion 10 mg/hr (12/08/15 0340)  . TPN (CLINIMIX) Adult without lytes 35 mL/hr at 12/07/15 1812   And  . fat emulsion 240 mL (12/07/15 1812)  . heparin 950 Units/hr (12/08/15 0017)    Assessment: 49 yoF admitted with UTI/sepsis. PMH significant for AFib and patient is on oral anticoagulation PTA with Eliquis, held for NPO status.  Pharmacy consulted to dose IV heparin for AFib, while Eliquis is on hold.  Today, 12/08/15  Heparin subtherapeutic @ 950 units/hr  CBC- Hg low but improved today, pltc wnl  CrCl ~10ml/min, improving  No bleeding or infusion issues noted  Goal of Therapy:  Heparin level 0.3-0.7 Monitor platelets by anticoagulation protocol: Yes   Plan:   Increase IV heparin rate to 1050 units/hr  Recheck 8hr heparin level  Daily heparin level and CBC  Monitor for signs of bleeding or thrombosis  Junita Push, PharmD, BCPS Pager: [phone] 12/08/2015, 7:47 AM

## 2015-12-08 NOTE — Progress Notes (Addendum)
ANTICOAGULATION CONSULT NOTE - Follow Up Consult  Pharmacy Consult for Heparin Indication: atrial fibrillation, hx of stroke, hx of DVT/PE  Patient Measurements: Height: 5\' 9"  (175.3 cm) Weight: 135 lb 5.8 oz (61.4 kg) IBW/kg (Calculated) : 66.2 Heparin Dosing Weight: actual weight  Medications:  Infusions:  . dextrose 5 %-0.9% nacl with kcl 50 mL/hr at 12/08/15 1755  . diltiazem (CARDIZEM) infusion 10 mg/hr (12/08/15 1234)  . TPN (CLINIMIX) Adult without lytes 35 mL/hr at 12/08/15 1712   And  . fat emulsion 240 mL (12/08/15 1711)  . heparin 1,050 Units/hr (12/08/15 0752)    Assessment: 70 yoF admitted with UTI/sepsis. PMH significant for a-fib, stroke, and DVT/PE on oral anticoagulation PTA with Eliquis, held for NPO status, SBO.  Pharmacy consulted to dose IV heparin for while Eliquis is on hold.  Today, 12/08/15  HL at 1657 = 0.45 units/mL, therapeutic on heparin infusion @ 1050 units/hr  CBC- Hgb low but stable today, Pltc WNL  CrCl ~93ml/min, improving  No bleeding or infusion issues noted per nursing  Goal of Therapy:  Heparin level 0.3-0.7 units/mL Monitor platelets by anticoagulation protocol: Yes   Plan:   Continue IV heparin infusion at 1050 units/hr  Daily heparin level and CBC  Monitor for signs of bleeding or thrombosis  F/u Arloa Koh, PharmD, BCPS Pager: [phone] 12/08/2015 6:14 PM

## 2015-12-08 NOTE — Progress Notes (Addendum)
PHARMACY - ADULT TOTAL PARENTERAL NUTRITION CONSULT NOTE   Pharmacy Consult for TPN Indication: Recurrent SBO  Patient Measurements: Height: 5\' 9"  (175.3 cm) Weight: 135 lb 5.8 oz (61.4 kg) IBW/kg (Calculated) : 66.2 TPN AdjBW (KG): 54.2 Body mass index is 19.99 kg/m. Usual Weight: 77 kg  Insulin Requirements: no Novolog in past 24h   Current Nutrition: Clear liquid diet + mighty shakes  IVF: D5NS + 60 KCl @ 100 ml/hr  Central access: PICC 11/19 TPN start date: 12/03/15   ASSESSMENT                                                                                                          HPI: 82 y.o. female with medical history significant of  CAD, dementia, DVT/PE, OSA, AFib on anticoagulation, recurrent SBO, CHF, NICM with mitral regurgitation, seizure disorder, recent stroke; presented with N/V, abd pain, and AMS. Recently admitted for SBO in October which improved with conservative mgmt.   Significant events:  11/20: patient signalling for water; appears to be thirsty 11/24: TPN stopped yesterday after family meeting with palliative care, however resume today per discussion with Dr Mahala Menghini.  Paged Algis Downs, palliative care PA to clarify family desires re: TPN but no return call.    11/25: Diet started per surgery  Today:   Glucose - No Hx DM; CBGs at goal <150  Electrolytes - WNL today except for Magnesium/Phosphorus which are slightly low  Renal - SCr improved; CrCl ~35 ml/min;   I/O -Good UOP (+1.6L); NG tube output improving (636ml/past 24hrs)  LFTs - albumin sl low; o/w normal  TGs - wnl  Prealbumin - slightly low  NUTRITIONAL GOALS                                                                                             RD recs: Kcal:  1500-1700 Protein:  70-80g Fluid:  1.5-1.7L/day  Clinimix 5/15 at a goal rate of 60 ml/hr + 20% fat emulsion at 10 ml/hr to provide: 72 g/day protein, 1502 Kcal/day.  PLAN  Now:  Magnesium 1gm IV x1 now  NaPhos 10mMol IV x1 after Magnesium   At 1800 today:  Continue Clinimix 5/15 at 6435ml/hr.  Continue 20% fat emulsion at 10 ml/hr.  TPN to contain standard multivitamins and trace elements; Pepcid 20 mg/day (reduced for renal impairment) added to minimize H+ losses to stomach fluid. Protonix IV BID per MD for same reason.  Sens SSI with q8 CBG checks  TPN lab panels on Mondays & Thursdays.  Repeat Bmet, Mag, Phos in am.   F/u daily  Please re-address with family tomorrow re: desire for artificial feedings (TPN) especially if patient able to tolerate oral diet.  Consider D/C TPN if appropriate.  Junita PushMichelle Konni Kesinger, PharmD, BCPS Pager: [phone] 12/08/2015, 7:55 AM

## 2015-12-09 DIAGNOSIS — F0391 Unspecified dementia with behavioral disturbance: Secondary | ICD-10-CM

## 2015-12-09 LAB — BASIC METABOLIC PANEL
Anion gap: 6 (ref 5–15)
BUN: 14 mg/dL (ref 6–20)
CALCIUM: 8.7 mg/dL — AB (ref 8.9–10.3)
CO2: 18 mmol/L — ABNORMAL LOW (ref 22–32)
Chloride: 112 mmol/L — ABNORMAL HIGH (ref 101–111)
Creatinine, Ser: 1.01 mg/dL — ABNORMAL HIGH (ref 0.44–1.00)
GFR calc Af Amer: 57 mL/min — ABNORMAL LOW (ref >60)
GFR, EST NON AFRICAN AMERICAN: 49 mL/min — AB (ref >60)
GLUCOSE: 109 mg/dL — AB (ref 65–99)
Potassium: 5.6 mmol/L — ABNORMAL HIGH (ref 3.5–5.1)
Sodium: 136 mmol/L (ref 135–145)

## 2015-12-09 LAB — CBC
HCT: 26.1 % — ABNORMAL LOW (ref 36.0–46.0)
Hemoglobin: 8.9 g/dL — ABNORMAL LOW (ref 12.0–15.0)
MCH: 28.2 pg (ref 26.0–34.0)
MCHC: 34.1 g/dL (ref 30.0–36.0)
MCV: 82.6 fL (ref 78.0–100.0)
PLATELETS: 210 10*3/uL (ref 150–400)
RBC: 3.16 MIL/uL — ABNORMAL LOW (ref 3.87–5.11)
RDW: 14.7 % (ref 11.5–15.5)
WBC: 8.4 10*3/uL (ref 4.0–10.5)

## 2015-12-09 LAB — MAGNESIUM: MAGNESIUM: 1.5 mg/dL — AB (ref 1.7–2.4)

## 2015-12-09 LAB — GLUCOSE, CAPILLARY
GLUCOSE-CAPILLARY: 101 mg/dL — AB (ref 65–99)
Glucose-Capillary: 110 mg/dL — ABNORMAL HIGH (ref 65–99)
Glucose-Capillary: 122 mg/dL — ABNORMAL HIGH (ref 65–99)
Glucose-Capillary: 119 mg/dL — ABNORMAL HIGH (ref 65–99)

## 2015-12-09 LAB — PHOSPHORUS: PHOSPHORUS: 2.4 mg/dL — AB (ref 2.5–4.6)

## 2015-12-09 LAB — HEPARIN LEVEL (UNFRACTIONATED): HEPARIN UNFRACTIONATED: 0.37 [IU]/mL (ref 0.30–0.70)

## 2015-12-09 MED ORDER — POLYETHYLENE GLYCOL 3350 17 G PO PACK
17.0000 g | PACK | Freq: Every day | ORAL | Status: DC
  Administered 2015-12-09 – 2015-12-10 (×2): 17 g via ORAL
  Filled 2015-12-09 (×3): qty 1

## 2015-12-09 MED ORDER — MAGNESIUM SULFATE IN D5W 1-5 GM/100ML-% IV SOLN
1.0000 g | Freq: Once | INTRAVENOUS | Status: AC
Start: 2015-12-09 — End: 2015-12-09
  Administered 2015-12-09: 1 g via INTRAVENOUS
  Filled 2015-12-09: qty 100

## 2015-12-09 MED ORDER — LEVETIRACETAM 750 MG PO TABS
750.0000 mg | ORAL_TABLET | Freq: Two times a day (BID) | ORAL | Status: DC
  Administered 2015-12-09 – 2015-12-10 (×3): 750 mg via ORAL
  Filled 2015-12-09 (×4): qty 1

## 2015-12-09 MED ORDER — METOPROLOL TARTRATE 50 MG PO TABS
100.0000 mg | ORAL_TABLET | Freq: Two times a day (BID) | ORAL | Status: DC
  Administered 2015-12-09 (×2): 100 mg via ORAL
  Filled 2015-12-09 (×2): qty 2

## 2015-12-09 MED ORDER — APIXABAN 2.5 MG PO TABS
2.5000 mg | ORAL_TABLET | Freq: Two times a day (BID) | ORAL | Status: DC
  Administered 2015-12-09 – 2015-12-10 (×4): 2.5 mg via ORAL
  Filled 2015-12-09 (×5): qty 1

## 2015-12-09 MED ORDER — DILTIAZEM HCL ER BEADS 240 MG PO CP24
360.0000 mg | ORAL_CAPSULE | Freq: Every day | ORAL | Status: DC
  Administered 2015-12-09 – 2015-12-10 (×2): 360 mg via ORAL
  Filled 2015-12-09 (×4): qty 1

## 2015-12-09 MED ORDER — SODIUM PHOSPHATES 45 MMOLE/15ML IV SOLN
10.0000 mmol | Freq: Once | INTRAVENOUS | Status: AC
  Administered 2015-12-09: 10 mmol via INTRAVENOUS
  Filled 2015-12-09: qty 3.33

## 2015-12-09 NOTE — Progress Notes (Signed)
Daily Progress Note   Patient Name: Brandi Heath       Date: 12/09/2015 DOB: 04-03-1927  Age: 82 y.o. MRN#: [id number] Attending Physician: Nita Sells, MD Primary Care Physician: Inocencio Homes, MD Admit Date: 11/30/2015  Reason for Consultation/Follow-up: Establishing goals of care  Subjective: Met today with patient and her family, including 3 daughters.    We discussed her clinical course to this point in time including improvement she has shown over past 48 hours.  We also discussed that she continues to have multiple comorbid conditions that are not going to be fixed, including her dementia, her poor nutritional status, severe debility and her sacral wound (which will not heal due to nutritional status).  Discussed options moving forward, and, in either case, plan is for patient to return to [facility] on discharge.  We discussed if she would be well served to transition back to [facility] with the support of hospice.  During prior meeting, she had been noted to state that she would not want to come back to the hospital.  Today, when asked this, she reported.  "Maybe. I don't know."    I recommend palliative to follow her at SNF and once she is medically maximized and in long term (rather that rehab) care, consider invoking her hospice benefit at that time.  Length of Stay: 9  Current Medications: Scheduled Meds:  . apixaban  2.5 mg Oral BID  . bisacodyl  10 mg Rectal Daily  . chlorhexidine  15 mL Mouth Rinse BID  . diltiazem  360 mg Oral Daily  . insulin aspart  0-9 Units Subcutaneous Q8H  . levETIRAcetam  750 mg Intravenous Q12H  . lip balm  1 application Topical BID  . mouth rinse  15 mL Mouth Rinse q12n4p  . metoprolol  100 mg Oral BID  . MUSCLE RUB   Topical QHS  .  pantoprazole (PROTONIX) IV  40 mg Intravenous Q12H  . polyethylene glycol  17 g Oral Daily  . sodium chloride flush  10-40 mL Intracatheter Q12H  . sodium chloride flush  3 mL Intravenous Q12H  . sodium phosphate  Dextrose 5% IVPB  10 mmol Intravenous Once    Continuous Infusions: . diltiazem (CARDIZEM) infusion 15 mg/hr (12/09/15 0622)  . TPN (CLINIMIX) Adult without lytes 35 mL/hr at 12/08/15 1712  And  . fat emulsion 240 mL (12/08/15 1711)    PRN Meds: [DISCONTINUED] acetaminophen **OR** acetaminophen, magic mouthwash, menthol-cetylpyridinium, ondansetron **OR** ondansetron (ZOFRAN) IV, phenol, sodium chloride flush  Physical Exam  Constitutional: She appears well-developed.  Frail and chronically ill appearing  HENT:  Head: Normocephalic and atraumatic.  Cardiovascular:  Murmur heard. Tachy irreg irreg  Pulmonary/Chest: Effort normal. No respiratory distress. She has no wheezes. She has no rales. She exhibits no tenderness.  Abdominal: Soft. She exhibits no distension and no mass. There is no tenderness. There is no guarding.  Musculoskeletal: She exhibits no edema or deformity.  Skin: Skin is warm and dry. No erythema.    Vital Signs: BP 105/75 (BP Location: Left Arm)   Pulse 67   Temp 98.8 F (37.1 C) (Oral)   Resp 18   Ht 5' 9"  (1.753 m)   Wt 61.4 kg (135 lb 5.8 oz)   SpO2 100%   BMI 19.99 kg/m  SpO2: SpO2: 100 % O2 Device: O2 Device: Not Delivered O2 Flow Rate: O2 Flow Rate (L/min): 2 L/min  Intake/output summary:   Intake/Output Summary (Last 24 hours) at 12/09/15 1602 Last data filed at 12/09/15 9518  Gross per 24 hour  Intake          1065.88 ml  Output              725 ml  Net           340.88 ml   LBM: Last BM Date: 12/07/15 Baseline Weight: Weight: 66.2 kg (145 lb 15.1 oz) Most recent weight: Weight: 61.4 kg (135 lb 5.8 oz)       Palliative Assessment/Data:    Flowsheet Rows   Flowsheet Row Most Recent Value  Intake Tab  Referral  Department  Hospitalist  Unit at Time of Referral  Intermediate Care Unit  Palliative Care Primary Diagnosis  Other (Comment)  Date Notified  12/04/15  Palliative Care Type  New Palliative care  Reason for referral  Clarify Goals of Care  Date of Admission  11/30/15  Date first seen by Palliative Care  12/04/15  # of days Palliative referral response time  0 Day(s)  # of days IP prior to Palliative referral  4  Clinical Assessment  Palliative Performance Scale Score  20%  Psychosocial & Spiritual Assessment  Palliative Care Outcomes      Patient Active Problem List   Diagnosis Date Noted  . Chronic anticoagulation 12/06/2015  . Encounter for hospice care discussion   . Palliative care encounter   . Goals of care, counseling/discussion   . Protein-calorie malnutrition, severe 12/03/2015  . Pressure injury of skin 12/01/2015  . Acute renal failure (ARF) ([place]) 11/30/2015  . Dehydration 11/30/2015  . Acute kidney injury ([place]) 11/14/2015  . Non-ischemic cardiomyopathy ([place]) 11/11/2015  . MR (mitral regurgitation) 11/11/2015  . Atrial fibrillation with RVR ([place]) 10/28/2015  . Acute respiratory failure with hypoxia ([place]) 10/17/2015  . Slurred speech   . Left sided numbness   . Acute on chronic diastolic heart failure ([place]) 10/04/2015  . Normocytic anemia 10/04/2015  . Thrombocytopenia ([place]) 10/04/2015  . TIA (transient ischemic attack) 10/04/2015  . Cough 10/02/2015  . SBO (small bowel obstruction) 10/01/2015  . Chronic CHF (congestive heart failure) ([place]) 09/29/2015  . Venous insufficiency (chronic) (peripheral) 09/29/2015  . Vitamin B12 deficiency 09/29/2015  . Anemia, chronic disease 09/29/2015  . Enterococcus UTI 09/02/2015  . GERD (gastroesophageal reflux disease) 09/02/2015  . Adynamic ileus ([place]) 06/11/2015  .  CHF, acute on chronic ([place]) 06/11/2015  . Personal history of DVT (deep vein thrombosis) 06/11/2015  . Non-intractable vomiting with nausea 06/03/2015  .  Abdominal aortic aneurysm without rupture ([place]) 05/31/2015  . Jaw pain 05/27/2015  . E. coli UTI 04/22/2015  . Prerenal acute renal failure ([place]) 04/22/2015  . History of CVA (cerebrovascular accident) 04/22/2015  . AF (atrial fibrillation) ([place]) 04/22/2015  . Polyneuropathy ([place]) 04/22/2015  . Edema 01/25/2015  . Atrial fibrillation with rapid ventricular response ([place]) 01/20/2015  . Acute encephalopathy 01/20/2015  . UTI (urinary tract infection) 01/20/2015  . Tracheobronchomalacia 01/20/2015  . Personal history of PE (pulmonary embolism) 01/20/2015  . Depression 01/20/2015  . OSA (obstructive sleep apnea)   . Dementia with behavioral disturbance   . Seizures ([place])   . Hyperlipidemia   . Hypertensive heart disease with CHF (congestive heart failure) ([place])   . Clotting disorder ([place])   . Cerebrovascular accident (CVA) due to thrombosis [facility])     Palliative Care Assessment & Plan   Patient Profile: 82 y.o. 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  Mrs. Spenser has had 4 admits in the last 6 months.  She has been treated conservatively with bowel rest and an NG tube.  On admission she had acute kidney injury.  Her baseline creatinine is normally 1.0, and is now 1.6.  Her pulse and blood pressure appear labile.  Assessment: Elderly female with recurrent bowel obstruction. Stabilized but her abdomen has not improved significantly .  This is her 4th admission in two months (3 for bowel obs and 1ce for dehdydration & TIA).  She is in a cycle of returning to the hospital receiving artificial hydration and conservative management,  improving and then being discharged into her normal environment where she dehydrates and falters again.   Recommendations/Plan: Continue current treatment.  Working to advance diet and  transition off of TPN.   No surgery or invasive procedures No artificial feeding. She has improved and plan for transition back to [facility].  Family would like for her to continue to receive therapy services (PT/OT/Speech) on discharge if she qualifies.  I recommend palliative to follow her at SNF and once she is medically maximized and in long term (rather that rehab) care, consider invoking her hospice benefit at that time.  Goals of Care and Additional Recommendations:  Limitations on Scope of Treatment: Minimize Medications, No Artificial Feeding and No Surgical Procedures  Code Status:  DNR  Prognosis:  Unable to determine, but she has been having recurrent bowel obstructions, debility, malnutrition, dementia and I believe her prognosis if her disease follows its natural course is less than 6 months and she should qualify for hospice support if so desired in the future  Discharge Planning:  SNF with palliative follow-up  Care plan was discussed with  MD and extended family.  Thank you for allowing the Palliative Medicine Team to assist in the care of this patient.   Time In: 1500 Time Out: 1545 Total Time 45 Prolonged Time Billed No      Greater than 50%  of this time was spent counseling and coordinating care related to the above assessment and plan.  Micheline Rough, MD [place] [facility] Team [phone]   Please contact Palliative MedicineTeam phone at [phone] for questions and concerns.   Please see  AMION for individual provider pager numbers.

## 2015-12-09 NOTE — Progress Notes (Signed)
Pt h/r has climbed to 160's around 10pm 11/25. Cardizem titrated to 12.5. H/R stabilized until around 0600 at which point h/r climbed to 160's again. Cardizem titrated to 15. MD notified. Information passed on to day shift nurse. Golden Hurter

## 2015-12-09 NOTE — Progress Notes (Signed)
Subjective: No c/o. No n/v. Had 3 bms. Feels a little constipated. Denies abd pain. Liquids went a little rough yesterday per pt but going better today.   Objective: Vital signs in last 24 hours: Temp:  [97.7 F (36.5 C)-98.6 F (37 C)] 98.6 F (37 C) (11/26 0501) Pulse Rate:  [68-114] 94 (11/26 0501) Resp:  [18] 18 (11/26 0501) BP: (110-126)/(57-72) 110/57 (11/26 0501) SpO2:  [100 %] 100 % (11/26 0501) Last BM Date: 12/07/15  Intake/Output from previous day: 11/25 0701 - 11/26 0700 In: 1425.9 [P.O.:420; I.V.:898.4; IV Piggyback:107.5] Out: 1425 [Urine:1425] Intake/Output this shift: No intake/output data recorded.  Alert, approp Soft, nt, nd  Lab Results:   Recent Labs  12/08/15 1657 12/09/15 0527  WBC 7.2 8.4  HGB 9.1* 8.9*  HCT 26.4* 26.1*  PLT 204 210   BMET  Recent Labs  12/08/15 0512 12/09/15 0527  NA 139 136  K 4.9 5.6*  CL 110 112*  CO2 24 18*  GLUCOSE 136* 109*  BUN 19 14  CREATININE 1.01* 1.01*  CALCIUM 8.9 8.7*   PT/INR No results for input(s): LABPROT, INR in the last 72 hours. ABG No results for input(s): PHART, HCO3 in the last 72 hours.  Invalid input(s): PCO2, PO2  Studies/Results: Dg Abd 2 Views  Result Date: 12/07/2015 CLINICAL DATA:  Small bowel obstruction. EXAM: ABDOMEN - 2 VIEW COMPARISON:  Single-view of the abdomen 12/02/2015 and 12/06/2015. FINDINGS: NG tube remains in place. Mild gaseous distention of a loop of distal small bowel at 3.2 cm is unchanged. No free intraperitoneal air is identified. Cholecystectomy clips, aneurysm coils and aortoiliac stent graft early again seen. IMPRESSION: No marked change in mild gas distention of small bowel since the most recent examination compatible with small-bowel obstruction. Electronically Signed   By: Drusilla Kanner M.D.   On: 12/07/2015 10:12    Anti-infectives: Anti-infectives    Start     Dose/Rate Route Frequency Ordered Stop   12/03/15 0830  fluconazole (DIFLUCAN) tablet  150 mg     150 mg Oral  Once 12/03/15 0828 12/03/15 0859   12/02/15 2200  levofloxacin (LEVAQUIN) IVPB 500 mg  Status:  Discontinued     500 mg 100 mL/hr over 60 Minutes Intravenous Every 48 hours 11/30/15 2202 12/03/15 1036   11/30/15 2230  aztreonam (AZACTAM) 500 mg in dextrose 5 % 50 mL IVPB  Status:  Discontinued     500 mg 100 mL/hr over 30 Minutes Intravenous Every 8 hours 11/30/15 2203 12/01/15 0732   11/30/15 2145  levofloxacin (LEVAQUIN) IVPB 750 mg     750 mg 100 mL/hr over 90 Minutes Intravenous  Once 11/30/15 2130 11/30/15 2327   11/30/15 2145  aztreonam (AZACTAM) 2 g in dextrose 5 % 50 mL IVPB  Status:  Discontinued     2 g 100 mL/hr over 30 Minutes Intravenous  Once 11/30/15 2130 11/30/15 2203      Assessment/Plan: s/p * No surgery found * SBO Dementia AKI Atrial fibrillation with RVR Cardiomyopathy H/o dvt/pe Recent TIA vs seizure  Appears to be tolerating clears. Can adv to full liquids for dinner if no issues with clears this am and lunch.  Will add miralax Pt can be gradually advanced to a soft diet (would rec she stay chronically on a soft diet) Can start to wean TPN from my perpsective Signing off. pls call with questions Doesn't need f/u with [facility].   [name], MD, FACS General, Bariatric, & Min[facility]ally In[facility]   LOS: 9 days    Anderson HospitalWILSON,Brandi Smart M 12/09/2015

## 2015-12-09 NOTE — Progress Notes (Signed)
Brandi Heath XMD:[id number] DOB: 03-Apr-1927 DOA: 11/30/2015 PCP: Inocencio Homes, MD  Brief narrative:  82 y/o ? SNF [[facility]?] Recurrent SBO  Admission 11.1-->11.2.17 for CVA--turned out to be thought more 2/2 to [facility]  Admission 10/15-->10/18 for SBO-non op management  Admission 9/18-->9.25 SBO/Stroke/AEDCHF [full work-up performed then] H/o Afib on CHr AC=Chad2Vasc2=8--diagnosed ~ 2016 Dr. [facility] [no records available] H/o DVT/PE MOD-severe dementia on meds Sz disorder on Phenytoin OSA reported Depression on Meds  Admitted 11/20 with  High output SBO Hypernatreami sodd 152, K 2.3 Severe met alkaolsis Co4 46 Hypomagnesemia/hypophosphatemia/hypocalcemia  Placed on TPN, D5 + K 30 and loaded with Keppra On Cardizem and heparin Gtt  Discussions have been ongoing with palliative care as well as general surgery and patient has made marginal improvement but still is nothing by mouth  Finally started Lasix stool 12/07/2005 [name] and currently we're waiting on palliative input and eventually once this occurs we can talk about disposition    Past medical history-As per Problem list Consultants: Gen surgery consulted Palliative care following  Procedures:  None yet  Antibiotics:  Levaquin   Subjective   Passing stool alert orietned no othe rc/o eating somewhat   Objective    Interim History:   Telemetry: Sinus tach/A. fib 120   Objective: Vitals:   12/08/15 2152 12/09/15 0000 12/09/15 0501 12/09/15 1446  BP: 126/72 117/70 (!) 110/57 105/75  Pulse: (!) 101 (!) 114 94 67  Resp: 18  18 18   Temp: 97.9 F (36.6 C)  98.6 F (37 C) 98.8 F (37.1 C)  TempSrc: Axillary  Oral Oral  SpO2: 100% 100% 100% 100%  Weight:      Height:        Intake/Output Summary (Last 24 hours) at 12/09/15 1714 Last data filed at 12/09/15 0504  Gross per 24 hour  Intake          1065.88 ml  Output              725 ml  Net           340.88 ml     Exam:  General: Alert pleasant oriented, some weakness noted-no longer sleepy  no apparent distress, NG tube is in situ Cardiovascular: S1 and S2 tachycardic Respiratory: Clinically clear no added sound Abdomen: Soft nontender no rebound no guarding Skin no lower extremity edema no rash Neuro intact  Data Reviewed: Basic Metabolic Panel:  Recent Labs Lab 12/05/15 0433 12/06/15 0446 12/07/15 0434 12/08/15 0512 12/09/15 0527  NA 134* 136 137 139 136  K 3.2* 3.8 4.8 4.9 5.6*  CL 87* 99* 107 110 112*  CO2 39* 31 26 24  18*  GLUCOSE 151* 127* 122* 136* 109*  BUN 32* 34* 28* 19 14  CREATININE 1.26* 1.13* 0.99 1.01* 1.01*  CALCIUM 8.5* 8.4* 8.3* 8.9 8.7*  MG 1.3* 1.7 1.8 1.6* 1.5*  PHOS 1.2* 2.4* 2.5 2.3* 2.4*   Liver Function Tests:  Recent Labs Lab 12/03/15 0645 12/06/15 0446 12/07/15 0434  AST 18 25 18   ALT 12* 22 18  ALKPHOS 59 49 50  BILITOT 0.4 0.6 0.6  PROT 6.8 5.7* 5.7*  ALBUMIN 3.3* 2.8* 2.8*    Recent Labs Lab 12/03/15 0645  LIPASE 37   No results for input(s): AMMONIA in the last 168 hours. CBC:  Recent Labs Lab 12/06/15 0446 12/07/15 0434 12/08/15 0512 12/08/15 1657 12/09/15 0527  WBC 9.3 6.5 8.0 7.2 8.4  NEUTROABS  --   --   --  5.3  --   HGB 8.8* 8.6* 9.4* 9.1* 8.9*  HCT 25.6* 24.6* 27.2* 26.4* 26.1*  MCV 81.5 82.6 82.9 82.2 82.6  PLT 152 157 205 204 210   Cardiac Enzymes: No results for input(s): CKTOTAL, CKMB, CKMBINDEX, TROPONINI in the last 168 hours. BNP: Invalid input(s): POCBNP CBG:  Recent Labs Lab 12/08/15 1702 12/08/15 2353 12/09/15 0458 12/09/15 0740 12/09/15 1226  GLUCAP 129* 103* 110* 122* 119*    Recent Results (from the past 240 hour(s))  Urine culture     Status: Abnormal   Collection Time: 11/30/15  7:59 PM  Result Value Ref Range Status   Specimen Description URINE, CATHETERIZED  Final   Special Requests NONE  Final   Culture 40,000 COLONIES/mL PROTEUS MIRABILIS (A)  Final   Report Status  12/03/2015 FINAL  Final   Organism ID, Bacteria PROTEUS MIRABILIS (A)  Final      Susceptibility   Proteus mirabilis - MIC*    AMPICILLIN >=32 RESISTANT Resistant     CEFAZOLIN <=4 SENSITIVE Sensitive     CEFTRIAXONE <=1 SENSITIVE Sensitive     CIPROFLOXACIN >=4 RESISTANT Resistant     GENTAMICIN <=1 SENSITIVE Sensitive     IMIPENEM 2 SENSITIVE Sensitive     NITROFURANTOIN 256 RESISTANT Resistant     TRIMETH/SULFA >=320 RESISTANT Resistant     AMPICILLIN/SULBACTAM 16 INTERMEDIATE Intermediate     PIP/TAZO <=4 SENSITIVE Sensitive     * 40,000 COLONIES/mL PROTEUS MIRABILIS  MRSA PCR Screening     Status: None   Collection Time: 11/30/15 11:46 PM  Result Value Ref Range Status   MRSA by PCR NEGATIVE NEGATIVE Final    Comment:        The GeneXpert MRSA Assay (FDA approved for NASAL specimens only), is one component of a comprehensive MRSA colonization surveillance program. It is not intended to diagnose MRSA infection nor to guide or monitor treatment for MRSA infections.      Studies:              All Imaging reviewed and is as per above notation   Scheduled Meds: . apixaban  2.5 mg Oral BID  . bisacodyl  10 mg Rectal Daily  . chlorhexidine  15 mL Mouth Rinse BID  . diltiazem  360 mg Oral Daily  . insulin aspart  0-9 Units Subcutaneous Q8H  . levETIRAcetam  750 mg Oral BID  . lip balm  1 application Topical BID  . mouth rinse  15 mL Mouth Rinse q12n4p  . metoprolol  100 mg Oral BID  . MUSCLE RUB   Topical QHS  . pantoprazole (PROTONIX) IV  40 mg Intravenous Q12H  . polyethylene glycol  17 g Oral Daily  . sodium chloride flush  10-40 mL Intracatheter Q12H  . sodium chloride flush  3 mL Intravenous Q12H  . sodium phosphate  Dextrose 5% IVPB  10 mmol Intravenous Once   Continuous Infusions:       Assessment/Plan:   High output SBO-palliative care has been consulted and general surgery as well has been consulted  dispo pending pallitve re-visit am familoy  choses SNF with pallaitve ot follow Will need soft diet on going  Atrial fibrillation with RVR Chad2Vasc2= 8 previously on Elliquis change heparin GTTto po elliquis  Cardizem GTT >>home medication tiazac 360 every 24 hourly Resume toprol 100  twice a day  Altered electrolytes Mild hyperkalemi d/c fluids with K repalcing mag and phos[thanks pharmacy] At home on magnesium oxide 400 daily  as well as potassium chloride 20 mEq daily and is also on Lasix 40 daily  Compensated chronic diastolic heart failure See above discussion IV fluids going because of kidney injury  Seizure disorder previously on phenytoin Loaded with Keppra this admission Switched to po 11/26  Acute kidney injury Patient on admission had BUN/creatinine 60/3.4 and significant metabolic alkalosis --> 63/3.3-->54/5.6 continue IV fluid at above rate  Ascitic anemia Likely secondary to chronic disease and illness  D/c SNF in am 11/27 if all stable   [facility] Pager [phone] 12/09/2015, 5:14 PM    LOS: 9 days

## 2015-12-09 NOTE — Progress Notes (Signed)
[facility] CONSULT NOTE   Pharmacy Consult for TPN Indication: Recurrent SBO  Patient Measurements: Height: 5' 9"  (175.3 cm) Weight: 135 lb 5.8 oz (61.4 kg) IBW/kg (Calculated) : 66.2 TPN AdjBW (KG): 54.2 Body mass index is 19.99 kg/m. Usual Weight: 77 kg  Insulin Requirements: 3 units Novolog SSI in past 24 hours   Current Nutrition: Clear liquid diet   IVF: D5NS + 60 KCl @ 100 ml/hr  Central access: PICC 11/19 TPN start date: 12/03/15   ASSESSMENT                                                                                                          HPI: 82 y.o. female with medical history significant of  CAD, dementia, DVT/PE, OSA, AFib on anticoagulation, recurrent SBO, CHF, NICM with mitral regurgitation, seizure disorder, recent stroke; presented with N/V, abd pain, and AMS. Recently admitted for SBO in October which improved with conservative mgmt.   Significant events:  11/20: patient signalling for water; appears to be thirsty 11/24: TPN stopped yesterday after family meeting with palliative care, however resume today per discussion with Dr Verlon Au.  Paged Imogene Burn, palliative care PA to clarify family desires re: TPN but no return call.    11/25: Diet started per surgery 11/26: Surgery recommending to advance to full liquids for dinner if tolerates clears through lunch  Today:   Glucose - No Hx DM; CBGs at goal <150  Electrolytes - K+ elevated, Cl- elevated, Mag/Phos low   Renal - SCr stable; CrCl ~39 ml/min;   LFTs - albumin low; AST/ALT, Alk Phos, Tbili WNL (11/24)  TGs - WNL (11/21)  Prealbumin - 17 (11/21)  NUTRITIONAL GOALS                                                                                             RD recs: Kcal:  1500-1700 Protein:  70-80g Fluid:  1.5-1.7L/day  Clinimix 5/15 at a goal rate of 60 ml/hr + 20% fat emulsion at 10 ml/hr to provide: 72 g/day protein, 1502 Kcal/day.  PLAN  Magnesium 1gm IV x1 now  NaPhos 77mol IV x1   Per discussion with Dr. SVerlon Au discontinue IVF and can wean TPN to off today  Continue TPN and fat emulsion at current rate until 1800 today, then discontinue  D/C further TPN labs  D/C SSI/CBGs at 1800 today  Defer further labs to MD.   JLindell Spar PharmD, BCPS Pager: [phone]/26/2017 11:44 AM

## 2015-12-10 LAB — CBC
HEMATOCRIT: 27.2 % — AB (ref 36.0–46.0)
HEMOGLOBIN: 9.4 g/dL — AB (ref 12.0–15.0)
MCH: 28.2 pg (ref 26.0–34.0)
MCHC: 34.6 g/dL (ref 30.0–36.0)
MCV: 81.7 fL (ref 78.0–100.0)
Platelets: 202 10*3/uL (ref 150–400)
RBC: 3.33 MIL/uL — ABNORMAL LOW (ref 3.87–5.11)
RDW: 14.8 % (ref 11.5–15.5)
WBC: 7.3 10*3/uL (ref 4.0–10.5)

## 2015-12-10 LAB — COMPREHENSIVE METABOLIC PANEL
ALBUMIN: 3.1 g/dL — AB (ref 3.5–5.0)
ALK PHOS: 64 U/L (ref 38–126)
ALT: 14 U/L (ref 14–54)
ANION GAP: 8 (ref 5–15)
AST: 21 U/L (ref 15–41)
BUN: 15 mg/dL (ref 6–20)
CALCIUM: 9.4 mg/dL (ref 8.9–10.3)
CO2: 19 mmol/L — AB (ref 22–32)
Chloride: 107 mmol/L (ref 101–111)
Creatinine, Ser: 1.27 mg/dL — ABNORMAL HIGH (ref 0.44–1.00)
GFR calc Af Amer: 43 mL/min — ABNORMAL LOW (ref >60)
GFR calc non Af Amer: 37 mL/min — ABNORMAL LOW (ref >60)
GLUCOSE: 89 mg/dL (ref 65–99)
POTASSIUM: 4.8 mmol/L (ref 3.5–5.1)
SODIUM: 134 mmol/L — AB (ref 135–145)
Total Bilirubin: 0.9 mg/dL (ref 0.3–1.2)
Total Protein: 6.6 g/dL (ref 6.5–8.1)

## 2015-12-10 LAB — CBC WITH DIFFERENTIAL/PLATELET
Basophils Absolute: 0 10*3/uL (ref 0.0–0.1)
Basophils Relative: 0 %
Eosinophils Absolute: 0.1 10*3/uL (ref 0.0–0.7)
Eosinophils Relative: 1 %
HEMATOCRIT: 27.8 % — AB (ref 36.0–46.0)
HEMOGLOBIN: 9.8 g/dL — AB (ref 12.0–15.0)
LYMPHS ABS: 1 10*3/uL (ref 0.7–4.0)
LYMPHS PCT: 13 %
MCH: 28.3 pg (ref 26.0–34.0)
MCHC: 35.3 g/dL (ref 30.0–36.0)
MCV: 80.3 fL (ref 78.0–100.0)
Monocytes Absolute: 0.8 10*3/uL (ref 0.1–1.0)
Monocytes Relative: 10 %
NEUTROS PCT: 76 %
Neutro Abs: 5.8 10*3/uL (ref 1.7–7.7)
Platelets: 204 10*3/uL (ref 150–400)
RBC: 3.46 MIL/uL — AB (ref 3.87–5.11)
RDW: 14.6 % (ref 11.5–15.5)
WBC: 7.7 10*3/uL (ref 4.0–10.5)

## 2015-12-10 LAB — GLUCOSE, CAPILLARY: Glucose-Capillary: 107 mg/dL — ABNORMAL HIGH (ref 65–99)

## 2015-12-10 MED ORDER — ENSURE ENLIVE PO LIQD
237.0000 mL | Freq: Three times a day (TID) | ORAL | Status: DC
  Administered 2015-12-10 (×2): 237 mL via ORAL

## 2015-12-10 MED ORDER — DILTIAZEM HCL ER BEADS 300 MG PO CP24
420.0000 mg | ORAL_CAPSULE | Freq: Every day | ORAL | Status: DC
  Filled 2015-12-10: qty 1

## 2015-12-10 MED ORDER — SORBITOL 70 % SOLN
20.0000 mL | Freq: Every day | Status: DC
  Administered 2015-12-10: 20 mL via ORAL
  Filled 2015-12-10 (×3): qty 30

## 2015-12-10 MED ORDER — METOPROLOL TARTRATE 5 MG/5ML IV SOLN
5.0000 mg | Freq: Once | INTRAVENOUS | Status: AC
  Administered 2015-12-10: 5 mg via INTRAVENOUS
  Filled 2015-12-10: qty 5

## 2015-12-10 MED ORDER — METOPROLOL TARTRATE 50 MG PO TABS
75.0000 mg | ORAL_TABLET | Freq: Three times a day (TID) | ORAL | Status: DC
  Administered 2015-12-10 (×3): 75 mg via ORAL
  Filled 2015-12-10 (×4): qty 1

## 2015-12-10 NOTE — Care Management Important Message (Signed)
Important Message  Patient Details IM Letter given to Cookie/Case Manager to present to Patient. Name: Brandi Heath MRN: [id number] Date of Birth: [birth year]   Medicare Important Message Given:  Yes    Haskell Flirt 12/10/2015, 10:26 AMImportant Message  Patient Details  Name: Brandi Heath MRN: [id number] Date of Birth: 11-08-1927   Medicare Important Message Given:  Yes    Haskell Flirt 12/10/2015, 10:25 AM

## 2015-12-10 NOTE — Progress Notes (Signed)
Patient with recurrent afib with RVR, sustaining in 120-130s.  Chart reviewed, this is not a new issue.  Was on Diltiazem drip during this hospitalization.  Received Cardizem and Metoprolol as scheduled today.  BP in 100s, but likely able to tolerate one dose of IV Lopressor to see if this will improve heart rate without requiring patient to resume Cardizem drip.    Georgana Curio, M.D.

## 2015-12-10 NOTE — Progress Notes (Signed)
Patient vomited yellow liquid in moderated amount.PRN meds given

## 2015-12-10 NOTE — Progress Notes (Signed)
Nutrition Follow-up  DOCUMENTATION CODES:   Severe malnutrition in context of acute illness/injury, Underweight  INTERVENTION:   Ensure Enlive po BID, each supplement provides 350 kcal and 20 grams of protein  NUTRITION DIAGNOSIS:   Inadequate oral intake related to chronic illness as evidenced by meal completion < 25%.  GOAL:   Patient will meet greater than or equal to 90% of their needs  MONITOR:   PO intake, Supplement acceptance; monitor for refeeding  REASON FOR ASSESSMENT:   Malnutrition Screening Tool    ASSESSMENT:   82 y.o. female with medical history significant of  CAD, dementia, DVT, OSA, D, atrial fibrillation on anticoagulation, recurrent small bowel obstruction, CHF nonischemic cardiomyopathy with mitral regurgitation, seizure disorder, recent stroke, As with abdominal pain, vomiting and altered mental status.   TPN d/c'd 11/26. Patient now advanced to soft diet. Went by room today. Patient not really communicating well and seems confused but reports poor appetite. Spoke to RN, reports that patient is eating about 25% meals. No V/D.  Patient has lost 10lbs(7%) over 3 weeks. This is significant. Continue to monitor for refeeding. Mag and Phos low 11/26; values not rechecked today.     Medications reviewed and include: dulcolax, pantoprazole, miralax  Labs reviewed: Na 134(L), Co2 19(L), creat 1.27(L), Alb 3.1(L)  Phos 2.4(L)-11/26, Mg 1.5(L)-11/26 Hgb 9.4(L), Hct 27.2(L)  Diet Order:  DIET SOFT Room service appropriate? Yes; Fluid consistency: Thin  Skin:  Wound (see comment) (unstageable PU on sacrum)  Last BM:  11/24  Height:   Ht Readings from Last 1 Encounters:  12/01/15 5\' 9"  (1.753 m)    Weight:   Wt Readings from Last 1 Encounters:  12/08/15 135 lb 5.8 oz (61.4 kg)    Ideal Body Weight:  65.9 kg  BMI:  Body mass index is 19.99 kg/m.  Estimated Nutritional Needs:   Kcal:  1350-1600  Protein:  70-80g  Fluid:   1.5-1.7L/day  EDUCATION NEEDS:   No education needs identified at this time  Betsey Holiday, RD, LDN

## 2015-12-10 NOTE — Clinical Social Work Note (Signed)
Patient admitted from SNF, [facility] and plans to return once medically stable for discharge.   MSW remains available as needed.   Derenda Fennel, MSW [phone] 12/10/2015 11:44 AM

## 2015-12-10 NOTE — Progress Notes (Signed)
Brandi Heath NKN:[id number] DOB: 07-29-1927 DOA: 11/30/2015 PCP: Inocencio Homes, MD  Brief narrative:  82 y/o ? SNF [[facility]?] Recurrent SBO  Admission 11.1-->11.2.17 for CVA--turned out to be thought more 2/2 to [facility]  Admission 10/15-->10/18 for SBO-non op management  Admission 9/18-->9.25 SBO/Stroke/AEDCHF [full work-up performed then] H/o Afib on CHr AC=Chad2Vasc2=8--diagnosed ~ 2016 Dr. [facility] [no records available] H/o DVT/PE MOD-severe dementia on meds Sz disorder on Phenytoin OSA reported Depression on Meds  Admitted 11/20 with  High output SBO Hypernatreami sodd 152, K 2.3 Severe met alkaolsis Co4 46 Hypomagnesemia/hypophosphatemia/hypocalcemia  Placed on TPN, D5 + K 30 and loaded with Keppra On Cardizem and heparin Gtt  Discussions have been ongoing with palliative care as well as general surgery   The patient suddenly started passing stool again is more alert oriented and was transferred to telemetry unit 11/25   Past medical history-As per Problem list Consultants: Gen surgery consulted Palliative care following  Procedures:  None yet  Antibiotics:  Levaquin   Subjective   Seems to feel stopped up has some mild discomfort in abdomen and chest Has passed a stool No other issues present   Objective    Interim History:   Telemetry: Sinus tach/A. fib 120   Objective: Vitals:   12/09/15 2106 12/10/15 0657 12/10/15 0759 12/10/15 1526  BP: 105/61 109/66 111/80 111/77  Pulse: 78 95 (!) 137 (!) 121  Resp: 18 20    Temp: 99.2 F (37.3 C) 97.5 F (36.4 C)  97.9 F (36.6 C)  TempSrc: Oral Axillary  Axillary  SpO2: 100% 98%  94%  Weight:      Height:        Intake/Output Summary (Last 24 hours) at 12/10/15 1546 Last data filed at 12/10/15 1525  Gross per 24 hour  Intake              683 ml  Output              630 ml  Net               53 ml    Exam:  General: Alert pleasant oriented, some weakness noted-no  longer sleepy  no apparent distress, NG tube is in situ Cardiovascular: S1 and S2 tachycardic Respiratory: Clinically clear no added sound Abdomen: Soft nontender no rebound no guardingNo distention no rebound Skin no lower extremity edema no rash Neuro intact  Data Reviewed: Basic Metabolic Panel:  Recent Labs Lab 12/05/15 0433 12/06/15 0446 12/07/15 0434 12/08/15 0512 12/09/15 0527 12/10/15 0505  NA 134* 136 137 139 136 134*  K 3.2* 3.8 4.8 4.9 5.6* 4.8  CL 87* 99* 107 110 112* 107  CO2 39* _0 18* 19*  GLUCOSE 151* 127* 122* 136* 109* 89  BUN 32* 34* 28* _1 CREATININE 1.26* 1.13* 0.99 1.01* 1.01* 1.27*  CALCIUM 8.5* 8.4* 8.3* 8.9 8.7* 9.4  MG 1.3* 1.7 1.8 1.6* 1.5*  --   PHOS 1.2* 2.4* 2.5 2.3* 2.4*  --    Liver Function Tests:  Recent Labs Lab 12/06/15 0446 12/07/15 0434 12/10/15 0505  AST _2 ALT _3 ALKPHOS 49 50 64  BILITOT 0.6 0.6 0.9  PROT 5.7* 5.7* 6.6  ALBUMIN 2.8* 2.8* 3.1*   No results for input(s): LIPASE, AMYLASE in the last 168 hours. No results for input(s): AMMONIA in the last 168 hours. CBC:  Recent Labs Lab 12/07/15 0434 12/08/15 0512 12/08/15  1657 12/09/15 0527 12/10/15 0505  WBC 6.5 8.0 7.2 8.4 7.3  NEUTROABS  --   --  5.3  --   --   HGB 8.6* 9.4* 9.1* 8.9* 9.4*  HCT 24.6* 27.2* 26.4* 26.1* 27.2*  MCV 82.6 82.9 82.2 82.6 81.7  PLT 157 205 204 210 202   Cardiac Enzymes: No results for input(s): CKTOTAL, CKMB, CKMBINDEX, TROPONINI in the last 168 hours. BNP: Invalid input(s): POCBNP CBG:  Recent Labs Lab 12/08/15 2353 12/09/15 0458 12/09/15 0740 12/09/15 1226 12/09/15 1825  GLUCAP 103* 110* 122* 119* 101*    Recent Results (from the past 240 hour(s))  Urine culture     Status: Abnormal   Collection Time: 11/30/15  7:59 PM  Result Value Ref Range Status   Specimen Description URINE, CATHETERIZED  Final   Special Requests NONE  Final   Culture 40,000 COLONIES/mL PROTEUS MIRABILIS (A)  Final     Report Status 12/03/2015 FINAL  Final   Organism ID, Bacteria PROTEUS MIRABILIS (A)  Final      Susceptibility   Proteus mirabilis - MIC*    AMPICILLIN >=32 RESISTANT Resistant     CEFAZOLIN <=4 SENSITIVE Sensitive     CEFTRIAXONE <=1 SENSITIVE Sensitive     CIPROFLOXACIN >=4 RESISTANT Resistant     GENTAMICIN <=1 SENSITIVE Sensitive     IMIPENEM 2 SENSITIVE Sensitive     NITROFURANTOIN 256 RESISTANT Resistant     TRIMETH/SULFA >=320 RESISTANT Resistant     AMPICILLIN/SULBACTAM 16 INTERMEDIATE Intermediate     PIP/TAZO <=4 SENSITIVE Sensitive     * 40,000 COLONIES/mL PROTEUS MIRABILIS  MRSA PCR Screening     Status: None   Collection Time: 11/30/15 11:46 PM  Result Value Ref Range Status   MRSA by PCR NEGATIVE NEGATIVE Final    Comment:        The GeneXpert MRSA Assay (FDA approved for NASAL specimens only), is one component of a comprehensive MRSA colonization surveillance program. It is not intended to diagnose MRSA infection nor to guide or monitor treatment for MRSA infections.      Studies:              All Imaging reviewed and is as per above notation   Scheduled Meds: . apixaban  2.5 mg Oral BID  . bisacodyl  10 mg Rectal Daily  . chlorhexidine  15 mL Mouth Rinse BID  . diltiazem  360 mg Oral Daily  . feeding supplement (ENSURE ENLIVE)  237 mL Oral TID BM  . levETIRAcetam  750 mg Oral BID  . lip balm  1 application Topical BID  . mouth rinse  15 mL Mouth Rinse q12n4p  . metoprolol tartrate  75 mg Oral TID  . MUSCLE RUB   Topical QHS  . pantoprazole (PROTONIX) IV  40 mg Intravenous Q12H  . polyethylene glycol  17 g Oral Daily  . sodium chloride flush  10-40 mL Intracatheter Q12H  . sodium chloride flush  3 mL Intravenous Q12H  . sorbitol  20 mL Oral Daily   Continuous Infusions:       Assessment/Plan:   High output SBOWhich has resolved 11/25 family choses SNF with pallaitve Will need soft diet on going  Atrial fibrillation with RVR  Chad2Vasc2= 8 previously on Elliquis Not completely rate controlled Back from heparin to po elliquis  Cardizem GTT >>home medication tiazac 360 every 24 hourly, increased dose to 482*11/28  toprol 100  twice a day changed to 75 3 times a  day  Altered electrolytes Mild hyperkalemi d/c fluids with K Repeat mag and phosphorus in a.m. At home on magnesium oxide 400 daily as well as potassium chloride 20 mEq daily and is also on Lasix 40 daily  Compensated chronic diastolic heart failure See above discussion IV fluids going because of kidney injury  Seizure disorder previously on phenytoin Loaded with Keppra this admission Switched to po 11/26  Acute kidney injury Patient on admission had BUN/creatinine 60/3.4 and significant metabolic alkalosis --> 03/5.5-->97/4.1 continue IV fluid at above rate  Ascitic anemia Likely secondary to chronic disease and illness  Likely can discharge to skilled facility 11/28 if heart rate is better controlled   [facility] Pager [phone] 12/10/2015, 3:46 PM    LOS: 10 days

## 2015-12-11 ENCOUNTER — Inpatient Hospital Stay (HOSPITAL_COMMUNITY): Payer: Medicare Other

## 2015-12-11 LAB — COMPREHENSIVE METABOLIC PANEL
ALT: 15 U/L (ref 14–54)
ANION GAP: 9 (ref 5–15)
AST: 18 U/L (ref 15–41)
Albumin: 3.5 g/dL (ref 3.5–5.0)
Alkaline Phosphatase: 76 U/L (ref 38–126)
BUN: 22 mg/dL — ABNORMAL HIGH (ref 6–20)
CHLORIDE: 105 mmol/L (ref 101–111)
CO2: 23 mmol/L (ref 22–32)
CREATININE: 1.65 mg/dL — AB (ref 0.44–1.00)
Calcium: 9.9 mg/dL (ref 8.9–10.3)
GFR, EST AFRICAN AMERICAN: 31 mL/min — AB (ref >60)
GFR, EST NON AFRICAN AMERICAN: 27 mL/min — AB (ref >60)
Glucose, Bld: 107 mg/dL — ABNORMAL HIGH (ref 65–99)
POTASSIUM: 4 mmol/L (ref 3.5–5.1)
SODIUM: 137 mmol/L (ref 135–145)
Total Bilirubin: 1.3 mg/dL — ABNORMAL HIGH (ref 0.3–1.2)
Total Protein: 7.1 g/dL (ref 6.5–8.1)

## 2015-12-11 LAB — APTT: APTT: 35 s (ref 24–36)

## 2015-12-11 LAB — PHOSPHORUS: PHOSPHORUS: 5.3 mg/dL — AB (ref 2.5–4.6)

## 2015-12-11 LAB — MAGNESIUM: MAGNESIUM: 1.9 mg/dL (ref 1.7–2.4)

## 2015-12-11 LAB — GLUCOSE, CAPILLARY
GLUCOSE-CAPILLARY: 112 mg/dL — AB (ref 65–99)
GLUCOSE-CAPILLARY: 105 mg/dL — AB (ref 65–99)

## 2015-12-11 MED ORDER — METOPROLOL TARTRATE 5 MG/5ML IV SOLN
10.0000 mg | Freq: Four times a day (QID) | INTRAVENOUS | Status: DC
  Administered 2015-12-11 – 2015-12-13 (×8): 10 mg via INTRAVENOUS
  Filled 2015-12-11 (×8): qty 10

## 2015-12-11 MED ORDER — DILTIAZEM HCL 100 MG IV SOLR
5.0000 mg/h | INTRAVENOUS | Status: DC
  Administered 2015-12-11: 10 mg/h via INTRAVENOUS
  Administered 2015-12-12 – 2015-12-14 (×3): 5 mg/h via INTRAVENOUS
  Administered 2015-12-14: 15 mg/h via INTRAVENOUS
  Administered 2015-12-14 – 2015-12-15 (×2): 5 mg/h via INTRAVENOUS
  Administered 2015-12-15: 15 mg/h via INTRAVENOUS
  Administered 2015-12-16: 12.5 mg/h via INTRAVENOUS
  Administered 2015-12-16: 15 mg/h via INTRAVENOUS
  Filled 2015-12-11 (×12): qty 100

## 2015-12-11 MED ORDER — SODIUM CHLORIDE 0.9 % IV SOLN
750.0000 mg | INTRAVENOUS | Status: AC
  Administered 2015-12-11: 750 mg via INTRAVENOUS
  Filled 2015-12-11: qty 7.5

## 2015-12-11 MED ORDER — SODIUM CHLORIDE 0.9 % IV SOLN
INTRAVENOUS | Status: DC
  Administered 2015-12-11 – 2015-12-12 (×2): via INTRAVENOUS

## 2015-12-11 MED ORDER — HEPARIN (PORCINE) IN NACL 100-0.45 UNIT/ML-% IJ SOLN
900.0000 [IU]/h | INTRAMUSCULAR | Status: DC
  Administered 2015-12-11: 1050 [IU]/h via INTRAVENOUS
  Administered 2015-12-12: 950 [IU]/h via INTRAVENOUS
  Filled 2015-12-11 (×2): qty 250

## 2015-12-11 MED ORDER — SODIUM CHLORIDE 0.9 % IV SOLN
750.0000 mg | Freq: Two times a day (BID) | INTRAVENOUS | Status: DC
  Administered 2015-12-12 – 2015-12-22 (×20): 750 mg via INTRAVENOUS
  Filled 2015-12-11 (×25): qty 7.5

## 2015-12-11 NOTE — Progress Notes (Signed)
Decreased UOP--25cc, MD notified, IVF infusing will monitor and update MD. SRP, RN

## 2015-12-11 NOTE — Progress Notes (Addendum)
Pt vomited several times during the am--with 100-150 cc of yellow emesis noted. MD aware. ABD tight, distended and taunt, NO bowels present. MD rounded per notes and orders received.  NGT inserted per MD order with RN-Ene  Assistance. NO KUB needed, verified placement by immediate return of 3900 cc of golden yellow gastric secretions. MD notified, IVF started, VS stable, Pt oriented to name and calm and talking intermediately. Pt  stating immediate relief in soft voice, ABD soft. SRP, RN

## 2015-12-11 NOTE — Progress Notes (Signed)
 Misa Kever MRN:[id number] DOB: 11/16/1927 DOA: 11/30/2015 PCP: Anne Alexander, MD  Brief narrative:  82 y/o ? SNF [[facility]?] Recurrent SBO  Admission 11.1-->11.2.17 for CVA--turned out to be thought more 2/2 to Sz  Admission 10/15-->10/18 for SBO-non op management  Admission 9/18-->9.25 SBO/Stroke/AEDCHF [full work-up performed then] H/o Afib on CHr AC=Chad2Vasc2=8--diagnosed ~ 2016 Dr. Rosario [facility] [no records available] H/o DVT/PE MOD-severe dementia on meds Sz disorder on Phenytoin OSA reported Depression on Meds  Admitted 11/20 with  High output SBO Hypernatreami sodd 152, K 2.3 Severe met alkaolsis Co4 46 Hypomagnesemia/hypophosphatemia/hypocalcemia  Placed on TPN, D5 + K 30 and loaded with Keppra On Cardizem and heparin Gtt  Discussions have been ongoing with palliative care as well as general surgery   The patient suddenly started passing stool again is more alert oriented and was transferred to telemetry unit 11/25 Eventually her small bowel obstruction resurfaced on 1128 2017 being the fourth evidence of the same and Gen. surgery was once again consulted   Past medical history-As per Problem list Consultants: Gen surgery consulted Palliative care following  Procedures:  None yet  Antibiotics:  Levaquin   Subjective   Patient not passing gas and stool and having emesis NG tube 4 L put out since placed at lunch time Patient alert oriented in telling me she would want surgery if possible In conversation with the daughter she defers to her mom and patient is indeed clear enough to make medical decisions   Objective    Interim History:   Telemetry: Sinus tach/A. fib 120   Objective: Vitals:   12/10/15 1526 12/10/15 1607 12/10/15 2148 12/11/15 0430  BP: 111/77 117/81 108/68 105/76  Pulse: (!) 121 (!) 114 98 65  Resp:   18 20  Temp: 97.9 F (36.6 C)  97.6 F (36.4 C) 97.7 F (36.5 C)  TempSrc: Axillary  Axillary Axillary    SpO2: 94%  100% 95%  Weight:      Height:        Intake/Output Summary (Last 24 hours) at 12/11/15 1420 Last data filed at 12/11/15 0600  Gross per 24 hour  Intake              340 ml  Output              275 ml  Net               65 ml    Exam:  General: Alert pleasant oriented, some weakness noted-no longer sleepy  no apparent distress, NG tube is in situ Cardiovascular: S1 and S2 tachycardic Respiratory: Clinically clear no added sound Abdomen: Distended earlier when I saw her initially Skin no lower extremity edema no rash Neuro intact  Data Reviewed: Basic Metabolic Panel:  Recent Labs Lab 12/06/15 0446 12/07/15 0434 12/08/15 0512 12/09/15 0527 12/10/15 0505 12/11/15 0336  NA 136 137 139 136 134* 137  K 3.8 4.8 4.9 5.6* 4.8 4.0  CL 99* 107 110 112* 107 105  CO2 31 26 24 18* 19* 23  GLUCOSE 127* 122* 136* 109* 89 107*  BUN 34* 28* 19 14 15 22*  CREATININE 1.13* 0.99 1.01* 1.01* 1.27* 1.65*  CALCIUM 8.4* 8.3* 8.9 8.7* 9.4 9.9  MG 1.7 1.8 1.6* 1.5*  --  1.9  PHOS 2.4* 2.5 2.3* 2.4*  --  5.3*   Liver Function Tests:  Recent Labs Lab 12/06/15 0446 12/07/15 0434 12/10/15 0505 12/11/15 0336  AST 25 18 21 18  ALT   22 18 14 15  ALKPHOS 49 50 64 76  BILITOT 0.6 0.6 0.9 1.3*  PROT 5.7* 5.7* 6.6 7.1  ALBUMIN 2.8* 2.8* 3.1* 3.5   No results for input(s): LIPASE, AMYLASE in the last 168 hours. No results for input(s): AMMONIA in the last 168 hours. CBC:  Recent Labs Lab 12/08/15 0512 12/08/15 1657 12/09/15 0527 12/10/15 0505 12/10/15 1812  WBC 8.0 7.2 8.4 7.3 7.7  NEUTROABS  --  5.3  --   --  5.8  HGB 9.4* 9.1* 8.9* 9.4* 9.8*  HCT 27.2* 26.4* 26.1* 27.2* 27.8*  MCV 82.9 82.2 82.6 81.7 80.3  PLT 205 204 210 202 204   Cardiac Enzymes: No results for input(s): CKTOTAL, CKMB, CKMBINDEX, TROPONINI in the last 168 hours. BNP: Invalid input(s): POCBNP CBG:  Recent Labs Lab 12/09/15 0740 12/09/15 1226 12/09/15 1825 12/10/15 2352 12/11/15 0815   GLUCAP 122* 119* 101* 107* 112*    No results found for this or any previous visit (from the past 240 hour(s)).   Studies:              All Imaging reviewed and is as per above notation   Scheduled Meds: . apixaban  2.5 mg Oral BID  . bisacodyl  10 mg Rectal Daily  . chlorhexidine  15 mL Mouth Rinse BID  . feeding supplement (ENSURE ENLIVE)  237 mL Oral TID BM  . levETIRAcetam  750 mg Oral BID  . lip balm  1 application Topical BID  . mouth rinse  15 mL Mouth Rinse q12n4p  . metoprolol  10 mg Intravenous Q6H  . MUSCLE RUB   Topical QHS  . pantoprazole (PROTONIX) IV  40 mg Intravenous Q12H  . polyethylene glycol  17 g Oral Daily  . sodium chloride flush  10-40 mL Intracatheter Q12H  . sodium chloride flush  3 mL Intravenous Q12H  . sorbitol  20 mL Oral Daily   Continuous Infusions: . sodium chloride 75 mL/hr at 12/11/15 1354  . diltiazem (CARDIZEM) infusion 10 mg/hr (12/11/15 1213)        Assessment/Plan:   High output SBO-recurrent 4 Do not think she will resolve without an attempt at surgery if this is something that can be offered by general surgery  Atrial fibrillation with RVR Chad2Vasc2= 8 previously on Elliquis Continue by mouth metoprolol and Cardizem and placed back on Cardizem gtt. and will need heparin GTT as well  Altered electrolytes Repeat mag and phosphorus in a.m. At home on magnesium oxide 400 daily as well as potassium chloride 20 mEq daily and is also on Lasix 40 daily  Compensated chronic diastolic heart failure See above discussion IV fluids going because of kidney injury  Seizure disorder previously on phenytoin Loaded with Keppra this admission Switched to po 11/26 Will need to switch back to IV  Acute kidney injury Patient on admission had BUN/creatinine 60/3.4 and significant metabolic alkalosis --> 28/0.9-->19/1.0 Resume IV fluid  75 cc per hour  Ascitic anemia Likely secondary to chronic disease and illness    potential need  for surgery Not ready for d/c  [facility] Pager [phone] 12/11/2015, 2:20 PM    LOS: 11 days     

## 2015-12-11 NOTE — NC FL2 (Signed)
[place] MEDICAID FL2 LEVEL OF CARE SCREENING TOOL     IDENTIFICATION  Patient Name: Brandi Heath Birthdate: [birth year] Sex: female Admission Date (Current Location): 11/30/2015  [facility] [place]   Facility and Address:  [facility],  [address], [place] [id number]      Provider Number: [id number]  Attending Physician Name and Address:  Rhetta MuraJai-Gurmukh Samtani, MD  Relative Name and Phone Number:       Current Level of Care: Hospital Recommended Level of Care: Skilled Nursing Facility Prior Approval Number:    Date Approved/Denied:   PASRR Number:  [id number])  Discharge Plan: SNF    Current Diagnoses: Patient Active Problem List   Diagnosis Date Noted  . Chronic anticoagulation 12/06/2015  . Encounter for hospice care discussion   . Palliative care encounter   . Goals of care, counseling/discussion   . Protein-calorie malnutrition, severe 12/03/2015  . Pressure injury of skin 12/01/2015  . Acute renal failure (ARF) (HCC) 11/30/2015  . Dehydration 11/30/2015  . Acute kidney injury (HCC) 11/14/2015  . Non-ischemic cardiomyopathy (HCC) 11/11/2015  . MR (mitral regurgitation) 11/11/2015  . Atrial fibrillation with RVR (HCC) 10/28/2015  . Acute respiratory failure with hypoxia (HCC) 10/17/2015  . Slurred speech   . Left sided numbness   . Acute on chronic diastolic heart failure (HCC) 10/04/2015  . Normocytic anemia 10/04/2015  . Thrombocytopenia (HCC) 10/04/2015  . TIA (transient ischemic attack) 10/04/2015  . Cough 10/02/2015  . SBO (small bowel obstruction) 10/01/2015  . Chronic CHF (congestive heart failure) (HCC) 09/29/2015  . Venous insufficiency (chronic) (peripheral) 09/29/2015  . Vitamin B12 deficiency 09/29/2015  . Anemia, chronic disease 09/29/2015  . Enterococcus UTI 09/02/2015  . GERD (gastroesophageal reflux disease) 09/02/2015  . Adynamic ileus (HCC) 06/11/2015  . CHF, acute on chronic (HCC) 06/11/2015  .  Personal history of DVT (deep vein thrombosis) 06/11/2015  . Non-intractable vomiting with nausea 06/03/2015  . Abdominal aortic aneurysm without rupture (HCC) 05/31/2015  . Jaw pain 05/27/2015  . E. coli UTI 04/22/2015  . Prerenal acute renal failure (HCC) 04/22/2015  . History of CVA (cerebrovascular accident) 04/22/2015  . AF (atrial fibrillation) (HCC) 04/22/2015  . Polyneuropathy (HCC) 04/22/2015  . Edema 01/25/2015  . Atrial fibrillation with rapid ventricular response (HCC) 01/20/2015  . Acute encephalopathy 01/20/2015  . UTI (urinary tract infection) 01/20/2015  . Tracheobronchomalacia 01/20/2015  . Personal history of PE (pulmonary embolism) 01/20/2015  . Depression 01/20/2015  . OSA (obstructive sleep apnea)   . Dementia with behavioral disturbance   . Seizures (HCC)   . Hyperlipidemia   . Hypertensive heart disease with CHF (congestive heart failure) (HCC)   . Clotting disorder (HCC)   . Cerebrovascular accident (CVA) due to thrombosis (HCC)     Orientation RESPIRATION BLADDER Height & Weight     Self  Normal Continent, External catheter Weight: 135 lb 5.8 oz (61.4 kg) Height:  5\' 9"  (175.3 cm)  BEHAVIORAL SYMPTOMS/MOOD NEUROLOGICAL BOWEL NUTRITION STATUS   (NONE ) Convulsions/Seizures Continent Diet (Soft )  AMBULATORY STATUS COMMUNICATION OF NEEDS Skin   Extensive Assist Verbally Other (Comment) (Pressure Injury: Unstageable Location: left sacrum, dressing changes: PRN )                       Personal Care Assistance Level of Assistance  Total care Bathing Assistance: Maximum assistance Feeding assistance: Limited assistance Dressing Assistance: Maximum assistance Total Care Assistance: Maximum assistance   Functional Limitations Info  Speech, Hearing, Sight   Hearing Info: Impaired Speech Info: Adequate    SPECIAL CARE FACTORS FREQUENCY                       Contractures      Additional Factors Info  Code Status, Allergies Code Status  Info: DNR  Allergies Info: Chocolate, Lactose Intolerance (Gi), Penicillins           Current Medications (12/11/2015):  This is the current hospital active medication list Current Facility-Administered Medications  Medication Dose Route Frequency Provider Last Rate Last Dose  . apixaban (ELIQUIS) tablet 2.5 mg  2.5 mg Oral BID Rhetta Mura, MD   2.5 mg at 12/10/15 2214  . bisacodyl (DULCOLAX) suppository 10 mg  10 mg Rectal Daily Karie Soda, MD   10 mg at 12/10/15 0955  . chlorhexidine (PERIDEX) 0.12 % solution 15 mL  15 mL Mouth Rinse BID Therisa Doyne, MD   15 mL at 12/10/15 2215  . diltiazem (TIAZAC) 24 hr capsule 420 mg  420 mg Oral Daily Rhetta Mura, MD      . feeding supplement (ENSURE ENLIVE) (ENSURE ENLIVE) liquid 237 mL  237 mL Oral TID BM Rhetta Mura, MD   237 mL at 12/10/15 2214  . levETIRAcetam (KEPPRA) tablet 750 mg  750 mg Oral BID Rhetta Mura, MD   750 mg at 12/10/15 2214  . lip balm (CARMEX) ointment 1 application  1 application Topical BID Karie Soda, MD   1 application at 12/10/15 2216  . magic mouthwash  15 mL Oral QID PRN Karie Soda, MD      . MEDLINE mouth rinse  15 mL Mouth Rinse q12n4p Therisa Doyne, MD   15 mL at 12/10/15 1600  . menthol-cetylpyridinium (CEPACOL) lozenge 3 mg  1 lozenge Oral PRN Karie Soda, MD      . metoprolol tartrate (LOPRESSOR) tablet 75 mg  75 mg Oral TID Rhetta Mura, MD   75 mg at 12/10/15 2214  . MUSCLE RUB CREA   Topical QHS Tora Kindred York, PA-C      . ondansetron (ZOFRAN) tablet 4 mg  4 mg Oral Q6H PRN Therisa Doyne, MD       Or  . ondansetron (ZOFRAN) injection 4 mg  4 mg Intravenous Q6H PRN Therisa Doyne, MD   4 mg at 12/11/15 0440  . pantoprazole (PROTONIX) injection 40 mg  40 mg Intravenous Q12H Starleen Arms, MD   40 mg at 12/10/15 2215  . phenol (CHLORASEPTIC) mouth spray 2 spray  2 spray Mouth/Throat PRN Karie Soda, MD      . polyethylene glycol (MIRALAX /  GLYCOLAX) packet 17 g  17 g Oral Daily Gaynelle Adu, MD   17 g at 12/10/15 0954  . sodium chloride flush (NS) 0.9 % injection 10-40 mL  10-40 mL Intracatheter Q12H Starleen Arms, MD   10 mL at 12/07/15 2200  . sodium chloride flush (NS) 0.9 % injection 10-40 mL  10-40 mL Intracatheter PRN Starleen Arms, MD   20 mL at 12/11/15 0359  . sodium chloride flush (NS) 0.9 % injection 3 mL  3 mL Intravenous Q12H Therisa Doyne, MD   3 mL at 12/10/15 2215  . sorbitol 70 % solution 20 mL  20 mL Oral Daily Rhetta Mura, MD   20 mL at 12/10/15 1607     Discharge Medications: Please see discharge summary for a list of discharge medications.  Relevant Imaging Results:  Relevant  Lab Results:   Additional Information SSN [id number]  Derenda Fennel A

## 2015-12-11 NOTE — Progress Notes (Signed)
Subjective: This is her third hospitalization since September 2017 with SBO, seen 12/01/15 by Dr. Donell Beers.  She  failed initial SB protocol, and was placed on TNA 12/03/15.  Initially she had high output from the NG.  NG clamped and D/Ced 12/08/15. + BM and tolerating diet, to full liquids on 12/09/15.  We signed off at that point. The following day she felt stopped up, and has developed recurrent nausea and vomiting on 12/11/15.  An NG was placed and she drained 3900 ml.  Plain film this AM at noon shows:Scattered large and small bowel gas is noted. Dilated small bowel is noted in the mid abdomen on the right. There is a relative paucity of bowel gas on the left which may be related to distention of the stomach.  Consistent with partial to reoccurring SBO   CT 11/30/15: Distal small bowel obstruction, likely related to adhesions. Moderate-sized fluid-filled hiatal hernia.  Coronary artery disease, aortic atherosclerosis.  Prior stent graft repair of AAA. Decreasing aneurysm sac size since prior study. Labs today show a rising creatinine. K+ is normal.  CBC was normal yesterday.   Her last dose of Eliquis was last PM.   Objective: Vital signs in last 24 hours: Temp:  [97.6 F (36.4 C)-97.9 F (36.6 C)] 97.7 F (36.5 C) (11/28 0430) Pulse Rate:  [65-121] 65 (11/28 0430) Resp:  [18-20] 20 (11/28 0430) BP: (105-117)/(68-81) 105/76 (11/28 0430) SpO2:  [94 %-100 %] 95 % (11/28 0430) Last BM Date: 12/07/15 Afebrile, VSS  But tachycardic  Labs show rising creatinine Prealbumin on 12/03/15 17. Film:  Scattered large and small bowel gas is noted. Dilated small bowel is noted in the mid abdomen on the right. There is a relative paucity of bowel gas on the left which may be related to distention of the stomach.  Intake/Output from previous day: 11/27 0701 - 11/28 0700 In: 543 [P.O.:540; I.V.:3] Out: 275 [Urine:275] Intake/Output this shift: No intake/output data recorded.  General  appearance: Sleepy but remains confused with her dementia, she couldn't really tell me much about how she feels Chest:  Clear Cardiac:  Irregular rhythm  GI: soft, few BS, midline scar from prior incision.  she is not tender, no peritonitis Mental status:  Unable to answer questions about how she feels.  Lab Results:   Recent Labs  12/10/15 0505 12/10/15 1812  WBC 7.3 7.7  HGB 9.4* 9.8*  HCT 27.2* 27.8*  PLT 202 204    BMET  Recent Labs  12/10/15 0505 12/11/15 0336  NA 134* 137  K 4.8 4.0  CL 107 105  CO2 19* 23  GLUCOSE 89 107*  BUN 15 22*  CREATININE 1.27* 1.65*  CALCIUM 9.4 9.9   PT/INR No results for input(s): LABPROT, INR in the last 72 hours.   Recent Labs Lab 12/06/15 0446 12/07/15 0434 12/10/15 0505 12/11/15 0336  AST 25 18 21 18   ALT 22 18 14 15   ALKPHOS 49 50 64 76  BILITOT 0.6 0.6 0.9 1.3*  PROT 5.7* 5.7* 6.6 7.1  ALBUMIN 2.8* 2.8* 3.1* 3.5     Lipase     Component Value Date/Time   LIPASE 37 12/03/2015 0645     Studies/Results: Dg Abd 1 View  Result Date: 12/11/2015 CLINICAL DATA:  Abdominal pain and distention for several days EXAM: ABDOMEN - 1 VIEW COMPARISON:  11/17/2015 FINDINGS: There again noted changes consistent with aortic stent graft placement. The overall appearance is stable. IVC filter is noted as well but somewhat  obscured by the stent graft. Left external iliac artery stent is noted as well. Scattered large and small bowel gas is noted. Dilated small bowel is noted in the mid abdomen on the right. There is a relative paucity of bowel gas on the left which may be related to distention of the stomach. IMPRESSION: Changes consistent with at least partial small bowel obstruction. Electronically Signed   By: Alcide CleverMark  Lukens M.D.   On: 12/11/2015 12:00   Prior to Admission medications   Medication Sig Start Date End Date Taking? Authorizing Provider  acetaminophen (TYLENOL) 325 MG tablet Take 650 mg by mouth 3 (three) times daily.  pain    Yes Historical Provider, MD  apixaban (ELIQUIS) 2.5 MG TABS tablet Take 2.5 mg by mouth 2 (two) times daily.   Yes Historical Provider, MD  Calcium Carbonate-Vitamin D (CALCIUM-VITAMIN D) 500-200 MG-UNIT tablet Take 1 tablet by mouth daily.    Yes Historical Provider, MD  Cranberry 450 MG TABS Take 450 mg by mouth 2 (two) times daily.   Yes Historical Provider, MD  diltiazem (TIAZAC) 360 MG 24 hr capsule Take 360 mg by mouth daily.   Yes Historical Provider, MD  docusate sodium (COLACE) 100 MG capsule Take 100 mg by mouth 2 (two) times daily.   Yes Historical Provider, MD  donepezil (ARICEPT) 10 MG tablet Take 10 mg by mouth at bedtime.    Yes Historical Provider, MD  DULoxetine (CYMBALTA) 60 MG capsule Take 60 mg by mouth at bedtime.    Yes Historical Provider, MD  furosemide (LASIX) 40 MG tablet Take 40 mg by mouth daily.   Yes Historical Provider, MD  guaifenesin (ROBITUSSIN) 100 MG/5ML syrup Take 200 mg by mouth daily as needed for cough.   Yes Historical Provider, MD  Iron-FA-B Cmp-C-Biot-Probiotic (FUSION PLUS) CAPS Take 1 capsule by mouth daily.   Yes Historical Provider, MD  levETIRAcetam (KEPPRA) 750 MG tablet Take 1 tablet (750 mg total) by mouth 2 (two) times daily. 11/15/15  Yes Osvaldo ShipperGokul Krishnan, MD  LORazepam (ATIVAN) 2 MG/ML injection Inject 1 mg into the muscle 3 (three) times daily as needed for seizure.   Yes Historical Provider, MD  magnesium oxide (MAG-OX) 400 (241.3 Mg) MG tablet Take 400 mg by mouth daily.   Yes Historical Provider, MD  Menthol-Methyl Salicylate (MUSCLE RUB) 10-15 % CREA Apply 1 application topically 3 (three) times daily as needed (for leg pain).    Yes Historical Provider, MD  metoprolol (LOPRESSOR) 100 MG tablet Take 1 tablet (100 mg total) by mouth 2 (two) times daily. 10/08/15  Yes Renae FickleMackenzie Short, MD  Multiple Vitamins-Minerals (MULTIVITAMIN WITH MINERALS) tablet Take 1 tablet by mouth daily.   Yes Historical Provider, MD  Nutritional Supplements  (ENSURE ENLIVE PO) Take 1 Can by mouth daily before breakfast.   Yes Historical Provider, MD  omeprazole (PRILOSEC) 40 MG capsule Take 40 mg by mouth 2 (two) times daily.   Yes Historical Provider, MD  potassium chloride SA (K-DUR,KLOR-CON) 20 MEQ tablet Take 1 tablet (20 mEq total) by mouth daily. 10/08/15  Yes Renae FickleMackenzie Short, MD  promethazine (PHENERGAN) 25 MG suppository Place 25 mg rectally every 12 (twelve) hours as needed for nausea or vomiting.   Yes Historical Provider, MD  risperiDONE (RISPERDAL) 1 MG tablet Take 1 tablet (1 mg total) by mouth 2 (two) times daily. 11/15/15  Yes Osvaldo ShipperGokul Krishnan, MD  travoprost, benzalkonium, (TRAVATAN) 0.004 % ophthalmic solution Place 1 drop into both eyes at bedtime.   Yes Historical  Provider, MD  vitamin C (ASCORBIC ACID) 500 MG tablet Take 500 mg by mouth daily.   Yes Historical Provider, MD  atorvastatin (LIPITOR) 20 MG tablet Take 20 mg by mouth at bedtime.     Historical Provider, MD  ciprofloxacin (CIPRO) 250 MG tablet Take 250 mg by mouth 2 (two) times daily.    Historical Provider, MD  fluconazole (DIFLUCAN) 150 MG tablet Take 150 mg by mouth once.    Historical Provider, MD     Medications: . apixaban  2.5 mg Oral BID  . bisacodyl  10 mg Rectal Daily  . chlorhexidine  15 mL Mouth Rinse BID  . feeding supplement (ENSURE ENLIVE)  237 mL Oral TID BM  . levETIRAcetam  750 mg Oral BID  . lip balm  1 application Topical BID  . mouth rinse  15 mL Mouth Rinse q12n4p  . metoprolol  10 mg Intravenous Q6H  . MUSCLE RUB   Topical QHS  . pantoprazole (PROTONIX) IV  40 mg Intravenous Q12H  . polyethylene glycol  17 g Oral Daily  . sodium chloride flush  10-40 mL Intracatheter Q12H  . sodium chloride flush  3 mL Intravenous Q12H  . sorbitol  20 mL Oral Daily   . sodium chloride    . diltiazem (CARDIZEM) infusion 10 mg/hr (12/11/15 1213)    Assessment/Plan Recurrent SBO: Hospitalized  10/01/15, 10/28/15, 11/30/15 to present, Prior surgeries in  clude AAA repair, hysterectomy and cholecystectomy Possible CVA 11/14/15 Progressive renal insuffiencey CAD/CHF - EF 40-45% Atrial fibrillation on anticoagulation at home Dementia in SNF Severe deconditioning and malnutrition Hx of seizure OSA DVT/PE on apixaban (ELIQUIS) 2.5    Plan:  [name] is her primary contact [phone]. She just had 3900 ml out from the NG. Last BM recorded 12/10/15. She had her last dose of Eliquis last PM 2214 hours.  This was day 2 of full Eliquis dosing.  I would keep her NPO, continue NG, resume her TNA, put her back on Heparin.  I will review with Dr. Johna Sheriff.  Her last note from Palliative reports movement back to the SNF with consideration of Hospice at some point.  If we come to a decision for surgery she will need to be off the Eliquis, and be stable for anesthesia.      LOS: 11 days    Christana Angelica 12/11/2015 [phone]

## 2015-12-11 NOTE — Progress Notes (Signed)
ANTICOAGULATION CONSULT NOTE - Follow Up Consult  Pharmacy Consult for Heparin restart Indication: atrial fibrillation, hx of stroke, hx of DVT/PE  Patient Measurements: Height: 5\' 9"  (175.3 cm) Weight: 135 lb 5.8 oz (61.4 kg) IBW/kg (Calculated) : 66.2 Heparin Dosing Weight: actual weight  Medications:  Infusions:  . sodium chloride 75 mL/hr at 12/11/15 1354  . diltiazem (CARDIZEM) infusion 10 mg/hr (12/11/15 1213)    Assessment: 36 yoF admitted with UTI/sepsis. PMH significant for a-fib, stroke, and DVT/PE on oral anticoagulation PTA with Eliquis, held for NPO status, SBO.  Pharmacy consulted to dose IV heparin for while Eliquis is on hold.  Eliquis was resumed 11/26 but orders to change back to heparin gtt 11/28 for worsening SBO.  Today, 12/11/15  Last dose of apixaban 11/27 at 2200, dose not given 11/28 am due to vomiting  CBC- Hgb low/stable, pltc WNL 11/27  Goal of Therapy:  Heparin level 0.3-0.7 units/mL Monitor platelets by anticoagulation protocol: Yes   Plan:   No heparin bolus, start IV heparin infusion at 1050 units/hr as previously therapeutic at this rate  8h heparin level  Daily heparin level and CBC  Monitor for signs of bleeding or thrombosis  Juliette Alcide, PharmD, BCPS.   Pager: [phone] 12/11/2015 2:49 PM

## 2015-12-11 NOTE — Progress Notes (Signed)
Patient vomited yellow/orange liquid in moderate amount for second time this shift. PRN meds given.

## 2015-12-12 LAB — COMPREHENSIVE METABOLIC PANEL
ALT: 13 U/L — ABNORMAL LOW (ref 14–54)
ANION GAP: 12 (ref 5–15)
AST: 17 U/L (ref 15–41)
Albumin: 2.9 g/dL — ABNORMAL LOW (ref 3.5–5.0)
Alkaline Phosphatase: 57 U/L (ref 38–126)
BUN: 26 mg/dL — ABNORMAL HIGH (ref 6–20)
CALCIUM: 8.8 mg/dL — AB (ref 8.9–10.3)
CHLORIDE: 105 mmol/L (ref 101–111)
CO2: 22 mmol/L (ref 22–32)
Creatinine, Ser: 1.93 mg/dL — ABNORMAL HIGH (ref 0.44–1.00)
GFR calc non Af Amer: 22 mL/min — ABNORMAL LOW (ref >60)
GFR, EST AFRICAN AMERICAN: 26 mL/min — AB (ref >60)
Glucose, Bld: 86 mg/dL (ref 65–99)
POTASSIUM: 3.6 mmol/L (ref 3.5–5.1)
SODIUM: 139 mmol/L (ref 135–145)
Total Bilirubin: 1.1 mg/dL (ref 0.3–1.2)
Total Protein: 6.2 g/dL — ABNORMAL LOW (ref 6.5–8.1)

## 2015-12-12 LAB — CBC
HEMATOCRIT: 24.4 % — AB (ref 36.0–46.0)
HEMOGLOBIN: 8.6 g/dL — AB (ref 12.0–15.0)
MCH: 28.8 pg (ref 26.0–34.0)
MCHC: 35.2 g/dL (ref 30.0–36.0)
MCV: 81.6 fL (ref 78.0–100.0)
Platelets: 205 10*3/uL (ref 150–400)
RBC: 2.99 MIL/uL — AB (ref 3.87–5.11)
RDW: 14.9 % (ref 11.5–15.5)
WBC: 7 10*3/uL (ref 4.0–10.5)

## 2015-12-12 LAB — HEPARIN LEVEL (UNFRACTIONATED)
HEPARIN UNFRACTIONATED: 1.44 [IU]/mL — AB (ref 0.30–0.70)
HEPARIN UNFRACTIONATED: 0.85 [IU]/mL — AB (ref 0.30–0.70)
Heparin Unfractionated: 0.9 IU/mL — ABNORMAL HIGH (ref 0.30–0.70)

## 2015-12-12 LAB — GLUCOSE, CAPILLARY
GLUCOSE-CAPILLARY: 77 mg/dL (ref 65–99)
GLUCOSE-CAPILLARY: 81 mg/dL (ref 65–99)
Glucose-Capillary: 63 mg/dL — ABNORMAL LOW (ref 65–99)
Glucose-Capillary: 118 mg/dL — ABNORMAL HIGH (ref 65–99)
Glucose-Capillary: 72 mg/dL (ref 65–99)

## 2015-12-12 LAB — APTT
APTT: 107 s — AB (ref 24–36)
aPTT: 99 seconds — ABNORMAL HIGH (ref 24–36)
aPTT: 104 seconds — ABNORMAL HIGH (ref 24–36)

## 2015-12-12 MED ORDER — DEXTROSE 50 % IV SOLN
INTRAVENOUS | Status: AC
  Administered 2015-12-12: 17:00:00
  Filled 2015-12-12: qty 50

## 2015-12-12 MED ORDER — DEXTROSE 50 % IV SOLN
25.0000 mL | Freq: Once | INTRAVENOUS | Status: AC
  Administered 2015-12-12: 25 mL via INTRAVENOUS

## 2015-12-12 NOTE — Progress Notes (Addendum)
PROGRESS NOTE                                                                                                                                                                                                             Patient Demographics:    Brandi Heath, is a 82 y.o. female, DOB - May 14, 1927, ZSW:[id number]  Admit date - 11/30/2015   Admitting Physician Therisa Doyne, MD  Outpatient Primary MD for the patient is Merrilee Seashore, MD  LOS - 12  Outpatient Specialists: none  Chief Complaint  Patient presents with  . Emesis       Brief Narrative  82 year old female with history of A. 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 Even recurrent small bowel obstruction surgery consulted.    Subjective:   Patient having high output on NG drain. Denies any pain. Poorly communicative.   Assessment  & Plan :    Principal Problem:   Recurrent SBO (small bowel obstruction) Multiple small bowel obstruction (fourth episode in the past 2 months). Surgery discussing with patient's daughter who is inclined towards surgery. Continue nothing by mouth, NG to wall suction. IV fluids.  Serial abdominal exam   Active Problems:   Atrial fibrillation with RVR (HCC) On low-dose Cardizem drip. Rate well controlled today. IV heparin since patient is nothing by mouth.  Seizure disorder On IV phenytoin.  Acute kidney injury Secondary to dehydration. Slowly getting better with fluids.  Anemia of chronic disease At baseline  Moderate to severe dementia  Severe protein calorie malnutrition       Code Status : DO NOT RESUSCITATE  Family Communication  : No family at bedside  Disposition Plan  : Pending hospital course  Barriers For Discharge : Active symptoms  Consults  :  [place]  surgery  Procedures  : None  DVT Prophylaxis  : IV heparin  Lab Results  Component Value Date   PLT 205 12/12/2015    Antibiotics  :    Anti-infectives    Start     Dose/Rate Route Frequency Ordered Stop   12/03/15 0830  fluconazole (DIFLUCAN) tablet 150 mg     150 mg Oral  Once 12/03/15 0828 12/03/15 0859   12/02/15 2200  levofloxacin (LEVAQUIN) IVPB 500 mg  Status:  Discontinued     500 mg 100 mL/hr over 60 Minutes Intravenous Every 48 hours 11/30/15 2202 12/03/15 1036   11/30/15 2230  aztreonam (AZACTAM) 500 mg in dextrose 5 % 50 mL IVPB  Status:  Discontinued     500 mg 100 mL/hr over 30 Minutes Intravenous Every 8 hours 11/30/15 2203 12/01/15 0732   11/30/15 2145  levofloxacin (LEVAQUIN) IVPB 750 mg     750 mg 100 mL/hr over 90 Minutes Intravenous  Once 11/30/15 2130 11/30/15 2327   11/30/15 2145  aztreonam (AZACTAM) 2 g in dextrose 5 % 50 mL IVPB  Status:  Discontinued     2 g 100 mL/hr over 30 Minutes Intravenous  Once 11/30/15 2130 11/30/15 2203        Objective:   Vitals:   12/11/15 1759 12/11/15 2118 12/12/15 0056 12/12/15 0546  BP: (!) 103/53 (!) 95/50 (!) 111/49 (!) 102/52  Pulse: 84 73 87 88  Resp:  18  18  Temp:  98.4 F (36.9 C)  97.8 F (36.6 C)  TempSrc:  Axillary  Axillary  SpO2:  98%  96%  Weight:      Height:        Wt Readings from Last 3 Encounters:  12/08/15 61.4 kg (135 lb 5.8 oz)  11/14/15 66.2 kg (146 lb)  11/14/15 72.6 kg (160 lb)     Intake/Output Summary (Last 24 hours) at 12/12/15 1546 Last data filed at 12/12/15 1046  Gross per 24 hour  Intake          2594.55 ml  Output             2125 ml  Net           469.55 ml     Physical Exam  Gen: Not in distress HEENT: NG tube with bilious output, dry mucosa, supple neck Chest: clear b/l, no added sounds CVS: S1 and S2 irregular, no murmurs GI: soft, mild distention, nontender, bowel sounds absent Musculoskeletal: warm, no edema CNS: Alert and awake, poorly  communicative,    Data Review:    CBC  Recent Labs Lab 12/08/15 1657 12/09/15 0527 12/10/15 0505 12/10/15 1812 12/12/15 0123  WBC 7.2 8.4 7.3 7.7 7.0  HGB 9.1* 8.9* 9.4* 9.8* 8.6*  HCT 26.4* 26.1* 27.2* 27.8* 24.4*  PLT 204 210 202 204 205  MCV 82.2 82.6 81.7 80.3 81.6  MCH 28.3 28.2 28.2 28.3 28.8  MCHC 34.5 34.1 34.6 35.3 35.2  RDW 14.5 14.7 14.8 14.6 14.9  LYMPHSABS 1.2  --   --  1.0  --   MONOABS 0.7  --   --  0.8  --   EOSABS 0.1  --   --  0.1  --   BASOSABS 0.0  --   --  0.0  --     Chemistries   Recent Labs Lab 12/06/15 0446 12/07/15 0434 12/08/15 0512 12/09/15 0527 12/10/15 0505 12/11/15 0336 12/12/15 0123  NA 136 137 139 136 134* 137 139  K 3.8 4.8 4.9 5.6* 4.8 4.0 3.6  CL 99* 107 110 112* 107 105 105  CO2 31 26 24  18* 19* 23 22  GLUCOSE 127* 122* 136* 109* 89 107* 86  BUN 34* 28* 19 14 15  22* 26*  CREATININE 1.13* 0.99 1.01* 1.01* 1.27* 1.65* 1.93*  CALCIUM 8.4* 8.3* 8.9 8.7* 9.4 9.9 8.8*  MG 1.7 1.8 1.6* 1.5*  --  1.9  --   AST 25 18  --   --  21 18 17   ALT 22 18  --   --  14 15 13*  ALKPHOS 49 50  --   --  64 76 57  BILITOT 0.6 0.6  --   --  0.9 1.3* 1.1   ------------------------------------------------------------------------------------------------------------------ No results for input(s): CHOL, HDL, LDLCALC, TRIG, CHOLHDL, LDLDIRECT in the last 72 hours.  Lab Results  Component Value Date   HGBA1C 5.9 (H) 12/03/2015   ------------------------------------------------------------------------------------------------------------------ No results for input(s): TSH, T4TOTAL, T3FREE, THYROIDAB in the last 72 hours.  Invalid input(s): FREET3 ------------------------------------------------------------------------------------------------------------------ No results for input(s): VITAMINB12, FOLATE, FERRITIN, TIBC, IRON, RETICCTPCT in the last 72 hours.  Coagulation profile No results for input(s): INR, PROTIME in the last 168 hours.  No  results for input(s): DDIMER in the last 72 hours.  Cardiac Enzymes No results for input(s): CKMB, TROPONINI, MYOGLOBIN in the last 168 hours.  Invalid input(s): CK ------------------------------------------------------------------------------------------------------------------ No results found for: BNP  Inpatient Medications  Scheduled Meds: . chlorhexidine  15 mL Mouth Rinse BID  . feeding supplement (ENSURE ENLIVE)  237 mL Oral TID BM  . levETIRAcetam  750 mg Intravenous Q12H  . lip balm  1 application Topical BID  . mouth rinse  15 mL Mouth Rinse q12n4p  . metoprolol  10 mg Intravenous Q6H  . MUSCLE RUB   Topical QHS  . pantoprazole (PROTONIX) IV  40 mg Intravenous Q12H  . sodium chloride flush  10-40 mL Intracatheter Q12H  . sodium chloride flush  3 mL Intravenous Q12H  . sorbitol  20 mL Oral Daily   Continuous Infusions: . sodium chloride 75 mL/hr at 12/12/15 0100  . diltiazem (CARDIZEM) infusion 5 mg/hr (12/12/15 0357)  . heparin 950 Units/hr (12/12/15 1046)   PRN Meds:.magic mouthwash, menthol-cetylpyridinium, [DISCONTINUED] ondansetron **OR** ondansetron (ZOFRAN) IV, phenol, sodium chloride flush  Micro Results No results found for this or any previous visit (from the past 240 hour(s)).  Radiology Reports Ct Abdomen Pelvis Wo Contrast  Result Date: 11/30/2015 CLINICAL DATA:  Vomiting. Generalized abdominal pain and distention. EXAM: CT ABDOMEN AND PELVIS WITHOUT CONTRAST TECHNIQUE: Multidetector CT imaging of the abdomen and pelvis was performed following the standard protocol without IV contrast. COMPARISON:  10/01/2015 FINDINGS: Lower chest: There is cardiomegaly. Chronic opacities, likely scarring in the lung bases. No effusions. Densely calcified coronary arteries noted. There is a moderate-sized hiatal hernia which is fluid-filled. Hepatobiliary: Prior cholecystectomy.  No focal hepatic abnormality. Pancreas: Diffuse atrophy of the pancreas without visualized  focal abnormality or ductal dilatation. Spleen: No focal abnormality.  Normal size. Adrenals/Urinary Tract: Small cyst in the midpole of the right kidney. No hydronephrosis. Adrenal gland is unremarkable. Urinary bladder decompressed. Stomach/Bowel: The stomach is markedly distended with fluid. There are dilated small bowel loops in the right abdomen and extending into the pelvis. Transition point is in the mid pelvis on image 64, presumably related to adhesions. Findings compatible with distal small bowel obstruction. Colon is decompressed, grossly unremarkable. Vascular/Lymphatic: Prior stent graft repair of abdominal aortic aneurysm. The aneurysm sac size is 5.1 cm compared with 5.8 cm previously. No adenopathy. Reproductive: Prior hysterectomy.  No adnexal masses. Other: No free fluid or free air. Musculoskeletal: No acute bony abnormality or focal bone lesion. Diffuse degenerative disc and facet disease in the lumbar spine. IMPRESSION: Distal small bowel obstruction, likely related to adhesions. Moderate-sized fluid-filled hiatal hernia. Coronary artery disease, aortic atherosclerosis. Prior stent graft repair of AAA. Decreasing aneurysm sac size since prior study. Electronically Signed   By: Charlett Nose  M.D.   On: 11/30/2015 20:25   Dg Chest 2 View  Result Date: 11/14/2015 CLINICAL DATA:  Altered mental status. EXAM: CHEST  2 VIEW COMPARISON:  Chest radiograph 10/28/2015 FINDINGS: Cardiomediastinal contours are unchanged. Diffusely increased pulmonary markings remain. There is no focal airspace consolidation. No pneumothorax or sizable pleural effusion. IMPRESSION: 1. No radiographic evidence of pneumonia. 2. Unchanged findings of chronic lung disease with diffusely increased pulmonary markings and bibasilar no nodular opacities. Electronically Signed   By: Deatra Robinson M.D.   On: 11/14/2015 14:16   Dg Abd 1 View  Result Date: 12/11/2015 CLINICAL DATA:  Abdominal pain and distention for several days  EXAM: ABDOMEN - 1 VIEW COMPARISON:  11/17/2015 FINDINGS: There again noted changes consistent with aortic stent graft placement. The overall appearance is stable. IVC filter is noted as well but somewhat obscured by the stent graft. Left external iliac artery stent is noted as well. Scattered large and small bowel gas is noted. Dilated small bowel is noted in the mid abdomen on the right. There is a relative paucity of bowel gas on the left which may be related to distention of the stomach. IMPRESSION: Changes consistent with at least partial small bowel obstruction. Electronically Signed   By: Alcide Clever M.D.   On: 12/11/2015 12:00   Dg Abd 1 View  Result Date: 12/01/2015 CLINICAL DATA:  Small bowel obstruction EXAM: ABDOMEN - 1 VIEW COMPARISON:  11/30/2015 FINDINGS: Dilated small bowel loops again noted right abdomen and pelvis compatible with small bowel obstruction, unchanged. NG tube remains in the stomach. Stent graft again noted. IVC filter noted. No free air or visible organomegaly. IMPRESSION: Continued small bowel dilatation compatible with small bowel obstruction. No real change. Electronically Signed   By: Charlett Nose M.D.   On: 12/01/2015 07:09   Ct Head Wo Contrast  Result Date: 11/14/2015 CLINICAL DATA:  Altered mental status/ lethargy.  Left arm weakness EXAM: CT HEAD WITHOUT CONTRAST TECHNIQUE: Contiguous axial images were obtained from the base of the skull through the vertex without intravenous contrast. COMPARISON:  Brain MRI October 03, 2015 FINDINGS: Brain: There is moderate diffuse atrophy, unchanged. Note that there is fairly severe cerebellar atrophy, likewise stable. There is no intracranial mass, hemorrhage, extra-axial fluid collection, or midline shift. There is patchy small vessel disease in the centra semiovale bilaterally. There is no new gray-white compartment lesion. No acute infarct evident. Vascular: There is no demonstrable hyperdense vessel. There is calcification  in the carotid siphon regions bilaterally. Skull: The bony calvarium appears intact. Sinuses/Orbits: Visualized paranasal sinuses are clear. Orbits appear symmetric bilaterally. Other: Mastoid air cells are clear. IMPRESSION: Diffuse atrophy, stable. Patchy periventricular small vessel disease, stable. No acute infarct is demonstrable on this study. No hemorrhage, mass, or extra-axial fluid collection. Carotid siphon region vascular calcification evident bilaterally. Electronically Signed   By: Bretta Bang III M.D.   On: 11/14/2015 13:54   Mr Brain Wo Contrast  Result Date: 11/14/2015 CLINICAL DATA:  Altered mental status and lethargy EXAM: MRI HEAD WITHOUT CONTRAST TECHNIQUE: Multiplanar, multiecho pulse sequences of the brain and surrounding structures were obtained without intravenous contrast. COMPARISON:  Head CT 11/14/2015, brain MRI 10/03/2015 FINDINGS: Brain: No acute infarct or intraparenchymal hemorrhage. The midline structures are normal. There is beginning confluent hyperintense T2-weighted signal within the periventricular white matter, most often seen in the setting of chronic microvascular ischemia. There is diffuse atrophy with mild parietal predominance. No mass lesion or midline shift. No hydrocephalus or extra-axial fluid  collection. Vascular: Major intracranial arterial and venous sinus flow voids are preserved. Chronic hemosiderin deposition within the right caudate body. Skull and upper cervical spine: The visualized skull base, calvarium, upper cervical spine and extracranial soft tissues are normal. Sinuses/Orbits: No fluid levels or advanced mucosal thickening. No mastoid effusion. Normal orbits. IMPRESSION: 1. No acute intracranial abnormality. 2. Findings of chronic microvascular disease and diffuse atrophy, with mild parietal predominance. Electronically Signed   By: Deatra RobinsonKevin  Herman M.D.   On: 11/14/2015 19:53   Dg Chest Port 1 View  Result Date: 12/02/2015 CLINICAL DATA:   Post PICC line insertion EXAM: PORTABLE CHEST 1 VIEW COMPARISON:  November 14, 2015 FINDINGS: The heart, hila, and mediastinum are unchanged. The right PICC line terminates near the cavoatrial junction. The distal tip of the NG tube is not assessed. There is tubing coiled back on itself projected over the midline of the upper chest, likely the portion of the NG tube outside dictation. High attenuation material in the lung bases is stable, possibly aspirated material. No other interval changes or acute abnormalities. IMPRESSION: 1. The right PICC line terminates near the caval atrial junction. Electronically Signed   By: Gerome Samavid  Williams III M.D   On: 12/02/2015 18:49   Dg Abd 2 Views  Result Date: 12/07/2015 CLINICAL DATA:  Small bowel obstruction. EXAM: ABDOMEN - 2 VIEW COMPARISON:  Single-view of the abdomen 12/02/2015 and 12/06/2015. FINDINGS: NG tube remains in place. Mild gaseous distention of a loop of distal small bowel at 3.2 cm is unchanged. No free intraperitoneal air is identified. Cholecystectomy clips, aneurysm coils and aortoiliac stent graft early again seen. IMPRESSION: No marked change in mild gas distention of small bowel since the most recent examination compatible with small-bowel obstruction. Electronically Signed   By: Drusilla Kannerhomas  Dalessio M.D.   On: 12/07/2015 10:12   Dg Abd Portable 1v  Result Date: 12/06/2015 CLINICAL DATA:  Follow up partial small bowel obstruction. EXAM: PORTABLE ABDOMEN - 1 VIEW COMPARISON:  12/02/2015 dating back to 10/01/2015, including CT abdomen and pelvis 11/30/2015 and 10/01/2015. FINDINGS: Nasogastric tube tip projects over the expected location of the midbody of a J-shaped stomach, unchanged. Several dilated loops of small bowel persist in the abdomen and pelvis, though these loops are less distended than on the examination 4 days ago. Gas and stool is present in the decompressed colon. No suggestion of free air on the supine image. Prior aorto bi-iliac stent  grafting for the previously identified abdominal aortic aneurysm. IVC filter in the lower IVC. Prior embolization of the right internal iliac artery. IMPRESSION: Persistent partial small bowel obstruction which is minimally improved since examination 4 days ago. Electronically Signed   By: Hulan Saashomas  Lawrence M.D.   On: 12/06/2015 10:56   Dg Abd Portable 1v  Result Date: 12/02/2015 CLINICAL DATA:  Small bowel obstruction. EXAM: PORTABLE ABDOMEN - 1 VIEW COMPARISON:  December 01, 2015 FINDINGS: A small bowel obstruction persists. No contrast is identified on today's study. However, only minimal contrast is seen in stomach previously. NG tube terminates in the left upper quadrant. IMPRESSION: Persistent small bowel obstruction. Electronically Signed   By: Gerome Samavid  Williams III M.D   On: 12/02/2015 16:01   Dg Abd Portable 1v-small Bowel Obstruction Protocol-initial, 8 Hr Delay  Result Date: 12/01/2015 CLINICAL DATA:  Small bowel obstruction. EXAM: PORTABLE ABDOMEN - 1 VIEW COMPARISON:  December 01, 2015 FINDINGS: The NG tube is stable. A small amount of contrast is seen in the stomach, new in the interval. No  other contrast seen throughout the abdomen. Persistently dilated small bowel loops in the right abdomen consistent with small bowel obstruction. No other change. IMPRESSION: Small bowel obstruction. Electronically Signed   By: Gerome Sam III M.D   On: 12/01/2015 22:49   Dg Abd Portable 1v-small Bowel Protocol-position Verification  Result Date: 12/01/2015 CLINICAL DATA:  Followup small bowel obstruction. Nasogastric tube placement. EXAM: PORTABLE ABDOMEN - 1 VIEW COMPARISON:  12/01/2015 FINDINGS: Nasogastric tube is seen with tip in the body the stomach. Moderately dilated small bowel loops in the right abdomen show no significant change compared to recent study. Aortoiliac stent graft again seen as well as embolization coils adjacent to the right iliac limb of the graft. IVC filter again noted.  IMPRESSION: Nasogastric tube tip remains in the body the stomach. No significant change in dilated small bowel loops, consistent with small bowel obstruction. Electronically Signed   By: Myles Rosenthal M.D.   On: 12/01/2015 12:49   Dg Abd Portable 1v  Result Date: 11/30/2015 CLINICAL DATA:  Nasogastric tube placement EXAM: PORTABLE ABDOMEN - 1 VIEW COMPARISON:  11/30/2015 CT FINDINGS: The nasogastric tube extends well into the stomach with tip in the region of the distal gastric body. IMPRESSION: Nasogastric tube extends into the stomach. Electronically Signed   By: Ellery Plunk M.D.   On: 11/30/2015 22:55    Time Spent in minutes  35   Eddie North M.D on 12/12/2015 at 3:46 PM  Between 7am to 7pm - Pager - [phone]  After 7pm go to [email or web address] - password [facility]  [facility] -  Office  [phone]

## 2015-12-12 NOTE — Progress Notes (Signed)
Hypoglycemic Event  CBG: 63  Treatment: Dextrose 50  69ml  Symptoms: lethergic  Follow-up CBG: Time:1745 CBG Result:118  Possible Reasons for Event: patient NPO  Comments/MD notified:MD notified    Charmian Muff

## 2015-12-12 NOTE — Discharge Instructions (Addendum)
[facility]      [facility], [place] [phone]  OPEN ABDOMINAL SURGERY: POST OP INSTRUCTIONS  Always review your discharge instruction sheet given to you by the facility where your surgery was performed.  IF YOU HAVE DISABILITY OR FAMILY LEAVE FORMS, YOU MUST BRING THEM TO THE OFFICE FOR PROCESSING.  PLEASE DO NOT GIVE THEM TO YOUR DOCTOR.  1. A prescription for pain medication may be given to you upon discharge.  Take your pain medication as prescribed, if needed.  If narcotic pain medicine is not needed, then you may take acetaminophen (Tylenol) or ibuprofen (Advil) as needed. 2. Take your usually prescribed medications unless otherwise directed. 3. If you need a refill on your pain medication, please contact your pharmacy. They will contact our office to request authorization.  Prescriptions will not be filled after 5pm or on week-ends. 4. You should follow a light diet the first few days after arrival home, such as soup and crackers, pudding, etc.unless your doctor has advised otherwise. A high-fiber, low fat diet can be resumed as tolerated.   Be sure to include lots of fluids daily. Most patients will experience some swelling and bruising on the chest and neck area.  Ice packs will help.  Swelling and bruising can take several days to resolve 5. Most patients will experience some swelling and bruising in the area of the incision. Ice pack will help. Swelling and bruising can take several days to resolve..  6. It is common to experience some constipation if taking pain medication after surgery.  Increasing fluid intake and taking a stool softener will usually help or prevent this problem from occurring.  A mild laxative (Milk of Magnesia or Miralax) should be taken according to package directions if there are no bowel movements after 48 hours. 7. Your staples have been removed. You may shower and replace the bandage daily. 8. ACTIVITIES:  You may resume regular (light) daily activities  beginning the next day--such as daily self-care, walking, climbing stairs--gradually increasing activities as tolerated.  You may have sexual intercourse when it is comfortable.  Refrain from any heavy lifting or straining until approved by your doctor. 9. You should see your doctor in the office for a follow-up appointment approximately 6 weeks after your surgery.  Make sure that you call for this appointment within a day or two after you arrive home to insure a convenient appointment time. OTHER INSTRUCTIONS:  _____________________________________________________________ _____________________________________________________________  WHEN TO CALL YOUR DOCTOR: 1. Fever over 101.0 2. Inability to urinate 3. Nausea and/or vomiting 4. Extreme swelling or bruising 5. Continued bleeding from incision. 6. Increased pain, redness, or drainage from the incision. 7. Difficulty swallowing or breathing 8. Muscle cramping or spasms. 9. Numbness or tingling in hands or feet or around lips.  The clinic staff is available to answer your questions during regular business hours.  Please dont hesitate to call and ask to speak to one of the nurses if you have concerns.  For further questions, please visit [email or web address]

## 2015-12-12 NOTE — Progress Notes (Addendum)
ANTICOAGULATION CONSULT NOTE - Follow Up Consult  Pharmacy Consult for Heparin Indication: atrial fibrillation, hx of stroke, hx of DVT/PE  Allergies  Allergen Reactions  . Chocolate Diarrhea  . Lactose Intolerance (Gi) Diarrhea  . Penicillins Other (See Comments)    Reaction:  Unknown  Has patient had a PCN reaction causing immediate rash, facial/tongue/throat swelling, SOB or lightheadedness with hypotension: Unsure Has patient had a PCN reaction causing severe rash involving mucus membranes or skin necrosis: Unsure Has patient had a PCN reaction that required hospitalization:  Unsure  Has patient had a PCN reaction occurring within the last 10 years: Unsure If all of the above answers are "NO", then may proceed with Cephalosporin use.    Patient Measurements: Height: 5\' 9"  (175.3 cm) Weight: 135 lb 5.8 oz (61.4 kg) IBW/kg (Calculated) : 66.2 Heparin Dosing Weight:   Vital Signs: Temp: 98.4 F (36.9 C) (11/28 2118) Temp Source: Oral (11/28 2118) BP: 111/49 (11/29 0056) Pulse Rate: 87 (11/29 0056)  Labs:  Recent Labs  12/09/15 0527 12/10/15 0505 12/10/15 1812 12/11/15 0336 12/11/15 1520 12/12/15 0123  HGB 8.9* 9.4* 9.8*  --   --  8.6*  HCT 26.1* 27.2* 27.8*  --   --  24.4*  PLT 210 202 204  --   --  205  APTT  --   --   --   --  35 107*  HEPARINUNFRC 0.37  --   --   --   --  1.44*  CREATININE 1.01* 1.27*  --  1.65*  --  1.93*    Estimated Creatinine Clearance: 20.3 mL/min (by C-G formula based on SCr of 1.93 mg/dL (H)).   Medications:  Infusions:  . sodium chloride 75 mL/hr at 12/12/15 0100  . diltiazem (CARDIZEM) infusion 5 mg/hr (12/12/15 0357)  . heparin 1,050 Units/hr (12/11/15 1636)    Assessment: Patient with high heparin level and PTT.  However, PTT ordered with Heparin level until both correlate due to possible drug-lab interaction between oral anticoagulant (rivaroxaban, edoxaban, or apixaban) and anti-Xa level (aka heparin level)    Goal of  Therapy:  Heparin level 0.3-0.7 units/ml aPTT 66-102 seconds Monitor platelets by anticoagulation protocol: Yes   Plan:  Decrease heparin to 950 units/hr Check heparin level and PTT at [address] Jacquenette Shone Crowford 12/12/2015,4:17 AM

## 2015-12-12 NOTE — Progress Notes (Signed)
Patient ID: Brandi Heath, female   DOB: [birth year], 82 y.o.   MRN: [id number]  [facility]     Subjective: NAE over night. Patient resting comfortably this AM  Objective: Vital signs in last 24 hours: Temp:  [97.7 F (36.5 C)-98.4 F (36.9 C)] 97.8 F (36.6 C) (11/29 0546) Pulse Rate:  [73-88] 88 (11/29 0546) Resp:  [16-18] 18 (11/29 0546) BP: (95-111)/(49-72) 102/52 (11/29 0546) SpO2:  [95 %-98 %] 96 % (11/29 0546) Last BM Date: 12/07/15  Intake/Output from previous day: 11/28 0701 - 11/29 0700 In: 2545.4 [I.V.:2310.4; NG/GT:20; IV Piggyback:215] Out: 9800 [Urine:350; Emesis/NG output:9450] Intake/Output this shift: No intake/output data recorded.  PE: Gen:  sleeping, NAD Card: irregularly irregular Pulm:  CTAB Abd: Soft, distended, +BS, no HSM, healed midline incision, no peritonitis Ext:  No erythema, edema, or tenderness   Lab Results:   Recent Labs  12/10/15 1812 12/12/15 0123  WBC 7.7 7.0  HGB 9.8* 8.6*  HCT 27.8* 24.4*  PLT 204 205   BMET  Recent Labs  12/11/15 0336 12/12/15 0123  NA 137 139  K 4.0 3.6  CL 105 105  CO2 23 22  GLUCOSE 107* 86  BUN 22* 26*  CREATININE 1.65* 1.93*  CALCIUM 9.9 8.8*   PT/INR No results for input(s): LABPROT, INR in the last 72 hours. CMP     Component Value Date/Time   NA 139 12/12/2015 0123   NA 142 10/30/2015   K 3.6 12/12/2015 0123   CL 105 12/12/2015 0123   CO2 22 12/12/2015 0123   GLUCOSE 86 12/12/2015 0123   BUN 26 (H) 12/12/2015 0123   BUN 9 10/30/2015   CREATININE 1.93 (H) 12/12/2015 0123   CALCIUM 8.8 (L) 12/12/2015 0123   PROT 6.2 (L) 12/12/2015 0123   ALBUMIN 2.9 (L) 12/12/2015 0123   AST 17 12/12/2015 0123   ALT 13 (L) 12/12/2015 0123   ALKPHOS 57 12/12/2015 0123   BILITOT 1.1 12/12/2015 0123   GFRNONAA 22 (L) 12/12/2015 0123   GFRAA 26 (L) 12/12/2015 0123   Lipase     Component Value Date/Time   LIPASE 37 12/03/2015 0645       Studies/Results: Dg Abd  1 View  Result Date: 12/11/2015 CLINICAL DATA:  Abdominal pain and distention for several days EXAM: ABDOMEN - 1 VIEW COMPARISON:  11/17/2015 FINDINGS: There again noted changes consistent with aortic stent graft placement. The overall appearance is stable. IVC filter is noted as well but somewhat obscured by the stent graft. Left external iliac artery stent is noted as well. Scattered large and small bowel gas is noted. Dilated small bowel is noted in the mid abdomen on the right. There is a relative paucity of bowel gas on the left which may be related to distention of the stomach. IMPRESSION: Changes consistent with at least partial small bowel obstruction. Electronically Signed   By: Alcide CleverMark  Lukens M.D.   On: 12/11/2015 12:00    Anti-infectives: Anti-infectives    Start     Dose/Rate Route Frequency Ordered Stop   12/03/15 0830  fluconazole (DIFLUCAN) tablet 150 mg     150 mg Oral  Once 12/03/15 0828 12/03/15 0859   12/02/15 2200  levofloxacin (LEVAQUIN) IVPB 500 mg  Status:  Discontinued     500 mg 100 mL/hr over 60 Minutes Intravenous Every 48 hours 11/30/15 2202 12/03/15 1036   11/30/15 2230  aztreonam (AZACTAM) 500 mg in dextrose 5 % 50 mL IVPB  Status:  Discontinued  500 mg 100 mL/hr over 30 Minutes Intravenous Every 8 hours 11/30/15 2203 12/01/15 0732   11/30/15 2145  levofloxacin (LEVAQUIN) IVPB 750 mg     750 mg 100 mL/hr over 90 Minutes Intravenous  Once 11/30/15 2130 11/30/15 2327   11/30/15 2145  aztreonam (AZACTAM) 2 g in dextrose 5 % 50 mL IVPB  Status:  Discontinued     2 g 100 mL/hr over 30 Minutes Intravenous  Once 11/30/15 2130 11/30/15 2203       Assessment/Plan Recurrent SBO: Hospitalized  10/01/15, 10/28/15, 11/30/15 to present - Prior surgeries include AAA repair, hysterectomy and cholecystectomy - XR yesterday: Changes consistent with at least partial small bowel obstruction - high NGT output (5200cc since placement yesterday) - last BM 11/26  Possible CVA  11/14/15 Progressive renal insuffiencey CAD/CHF - EF 40-45% Atrial fibrillation on anticoagulation at home Dementia in SNF Severe deconditioning and malnutrition Hx of seizure OSA 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 Continue NPO, NGT, IVF. Spoke with patient's daughter, [name], on the phone [phone]). She will be by to see her mother later today around noon. At this time she would consider surgical intervention if necessary, but she would like to talk to her mother again today before making any decisions.   LOS: 12 days    [name] , [facility] 12/12/2015, 8:50 AM Pager: [phone] Consults: [phone] Mon-Fri 7:00 am-4:30 pm Sat-Sun 7:00 am-11:30 am

## 2015-12-12 NOTE — Progress Notes (Signed)
ANTICOAGULATION CONSULT NOTE - Follow Up Consult  Pharmacy Consult for Heparin restart Indication: atrial fibrillation, hx of stroke, hx of DVT/PE  Patient Measurements: Height: 5\' 9"  (175.3 cm) Weight: 135 lb 5.8 oz (61.4 kg) IBW/kg (Calculated) : 66.2 Heparin Dosing Weight: actual weight  Medications:  Infusions:  . sodium chloride 75 mL/hr at 12/12/15 0100  . diltiazem (CARDIZEM) infusion 5 mg/hr (12/12/15 0357)  . heparin 950 Units/hr (12/12/15 1046)    Assessment: 35 yoF admitted with UTI/sepsis. PMH significant for a-fib, stroke, and DVT/PE on oral anticoagulation PTA with Eliquis, held for NPO status, SBO.  Pharmacy consulted to dose IV heparin for while Eliquis is on hold.  Eliquis was resumed 11/26 but orders to change back to heparin gtt 11/28 for worsening SBO.  Last dose of apixaban 11/27 at 2200, dose not given 11/28 am due to vomiting  Today, 12/12/15  Heparin level supratherapeutic, aPTT essentially therapeutic   Await correlation of heparin level and aPTT.  Apixaban interfering with anti-Xa assay  CBC- Hgb low/down overnight, pltc WNL   No bleeding or complications reported per discussion with RN  Goal of Therapy:  Heparin level 0.3-0.7 units/mL APTT 66-102 sec Monitor platelets by anticoagulation protocol: Yes   Plan:   Decrease IV heparin infusion to 900 units/hr based on aPTT   Daily heparin level and CBC  Monitor for signs of bleeding or thrombosis  Loralee Pacas, PharmD, BCPS Pager: [phone] 12/12/2015 9:31 PM

## 2015-12-12 NOTE — Progress Notes (Signed)
ANTICOAGULATION CONSULT NOTE - Follow Up Consult  Pharmacy Consult for Heparin restart Indication: atrial fibrillation, hx of stroke, hx of DVT/PE  Patient Measurements: Height: 5\' 9"  (175.3 cm) Weight: 135 lb 5.8 oz (61.4 kg) IBW/kg (Calculated) : 66.2 Heparin Dosing Weight: actual weight  Medications:  Infusions:  . sodium chloride 75 mL/hr at 12/12/15 0100  . diltiazem (CARDIZEM) infusion 5 mg/hr (12/12/15 0357)  . heparin 950 Units/hr (12/12/15 1046)    Assessment: 84 yoF admitted with UTI/sepsis. PMH significant for a-fib, stroke, and DVT/PE on oral anticoagulation PTA with Eliquis, held for NPO status, SBO.  Pharmacy consulted to dose IV heparin for while Eliquis is on hold.  Eliquis was resumed 11/26 but orders to change back to heparin gtt 11/28 for worsening SBO.  Last dose of apixaban 11/27 at 2200, dose not given 11/28 am due to vomiting  Today, 12/12/15  APTT therapeutic   Await correlation of heparin level and aPTT.  apixaban interfering with anti-Xa assay  CBC- Hgb low/down overnight, pltc WNL 11/27  No obvious bleeding  Goal of Therapy:  Heparin level 0.3-0.7 units/mL APTT 66-102 sec Monitor platelets by anticoagulation protocol: Yes   Plan:   Continue IV heparin infusion at 950 units/hr as aPTT therapeutic  8h heparin level to check confirmatory level  Daily heparin level and CBC  Monitor for signs of bleeding or thrombosis  Juliette Alcide, PharmD, BCPS.   Pager: [phone] 12/12/2015 3:09 PM

## 2015-12-13 DIAGNOSIS — I4891 Unspecified atrial fibrillation: Secondary | ICD-10-CM

## 2015-12-13 DIAGNOSIS — E876 Hypokalemia: Secondary | ICD-10-CM

## 2015-12-13 DIAGNOSIS — Z01810 Encounter for preprocedural cardiovascular examination: Secondary | ICD-10-CM

## 2015-12-13 DIAGNOSIS — K56609 Unspecified intestinal obstruction, unspecified as to partial versus complete obstruction: Secondary | ICD-10-CM

## 2015-12-13 LAB — BASIC METABOLIC PANEL
Anion gap: 10 (ref 5–15)
BUN: 25 mg/dL — AB (ref 6–20)
CHLORIDE: 108 mmol/L (ref 101–111)
CO2: 23 mmol/L (ref 22–32)
Calcium: 8.8 mg/dL — ABNORMAL LOW (ref 8.9–10.3)
Creatinine, Ser: 1.5 mg/dL — ABNORMAL HIGH (ref 0.44–1.00)
GFR calc non Af Amer: 30 mL/min — ABNORMAL LOW (ref >60)
GFR, EST AFRICAN AMERICAN: 35 mL/min — AB (ref >60)
Glucose, Bld: 75 mg/dL (ref 65–99)
POTASSIUM: 2.8 mmol/L — AB (ref 3.5–5.1)
SODIUM: 141 mmol/L (ref 135–145)

## 2015-12-13 LAB — CBC
HCT: 23.8 % — ABNORMAL LOW (ref 36.0–46.0)
HEMOGLOBIN: 8.3 g/dL — AB (ref 12.0–15.0)
MCH: 28.5 pg (ref 26.0–34.0)
MCHC: 34.9 g/dL (ref 30.0–36.0)
MCV: 81.8 fL (ref 78.0–100.0)
Platelets: 207 10*3/uL (ref 150–400)
RBC: 2.91 MIL/uL — ABNORMAL LOW (ref 3.87–5.11)
RDW: 14.9 % (ref 11.5–15.5)
WBC: 6.9 10*3/uL (ref 4.0–10.5)

## 2015-12-13 LAB — GLUCOSE, CAPILLARY
GLUCOSE-CAPILLARY: 96 mg/dL (ref 65–99)
Glucose-Capillary: 73 mg/dL (ref 65–99)
Glucose-Capillary: 87 mg/dL (ref 65–99)

## 2015-12-13 LAB — APTT
APTT: 76 s — AB (ref 24–36)
aPTT: 78 seconds — ABNORMAL HIGH (ref 24–36)

## 2015-12-13 LAB — MAGNESIUM: MAGNESIUM: 1.6 mg/dL — AB (ref 1.7–2.4)

## 2015-12-13 LAB — HEPARIN LEVEL (UNFRACTIONATED)
Heparin Unfractionated: 0.74 IU/mL — ABNORMAL HIGH (ref 0.30–0.70)
Heparin Unfractionated: 0.55 IU/mL (ref 0.30–0.70)

## 2015-12-13 MED ORDER — GENTAMICIN SULFATE 40 MG/ML IJ SOLN
5.0000 mg/kg | INTRAVENOUS | Status: DC
  Filled 2015-12-13 (×2): qty 7.75

## 2015-12-13 MED ORDER — CHLORHEXIDINE GLUCONATE CLOTH 2 % EX PADS
6.0000 | MEDICATED_PAD | Freq: Once | CUTANEOUS | Status: AC
  Administered 2015-12-13: 6 via TOPICAL

## 2015-12-13 MED ORDER — MAGNESIUM SULFATE 2 GM/50ML IV SOLN
2.0000 g | Freq: Once | INTRAVENOUS | Status: AC
  Administered 2015-12-13: 2 g via INTRAVENOUS
  Filled 2015-12-13: qty 50

## 2015-12-13 MED ORDER — POTASSIUM CHLORIDE 2 MEQ/ML IV SOLN
INTRAVENOUS | Status: AC
  Administered 2015-12-13: 13:00:00 via INTRAVENOUS
  Administered 2015-12-14: 75 mL via INTRAVENOUS
  Administered 2015-12-14: 20:00:00 via INTRAVENOUS
  Administered 2015-12-14: 75 mL via INTRAVENOUS
  Administered 2015-12-14: 03:00:00 via INTRAVENOUS
  Filled 2015-12-13 (×5): qty 1000

## 2015-12-13 MED ORDER — CHLORHEXIDINE GLUCONATE CLOTH 2 % EX PADS
6.0000 | MEDICATED_PAD | Freq: Once | CUTANEOUS | Status: AC
  Administered 2015-12-14: 6 via TOPICAL

## 2015-12-13 MED ORDER — HEPARIN (PORCINE) IN NACL 100-0.45 UNIT/ML-% IJ SOLN
900.0000 [IU]/h | INTRAMUSCULAR | Status: DC
  Administered 2015-12-13: 900 [IU]/h via INTRAVENOUS
  Filled 2015-12-13: qty 250

## 2015-12-13 MED ORDER — CLINDAMYCIN PHOSPHATE 900 MG/50ML IV SOLN
900.0000 mg | INTRAVENOUS | Status: DC
  Filled 2015-12-13: qty 50

## 2015-12-13 MED ORDER — DEXTROSE-NACL 5-0.45 % IV SOLN
INTRAVENOUS | Status: DC
  Administered 2015-12-13: 11:00:00 via INTRAVENOUS

## 2015-12-13 NOTE — Progress Notes (Signed)
Patient ID: Brandi Heath, female   DOB: 08/08/1927, 82 y.o.   MRN: [id number]  [facility]     Subjective: Intermittent tachycardia and hypotension over night, TMAX 99.1. WBC WNL. Not complaining of any abdominal pain, nausea, or vomiting this morning. States that she feels constipated. Not passing any flatus. Main complaint this AM is dry mouth. No family at bedside.  Objective: Vital signs in last 24 hours: Temp:  [98.2 F (36.8 C)-99.1 F (37.3 C)] 99.1 F (37.3 C) (11/30 0550) Pulse Rate:  [85-125] 109 (11/30 0611) Resp:  [16-18] 16 (11/30 0550) BP: (86-110)/(56-60) 86/57 (11/30 0550) SpO2:  [93 %-100 %] 100 % (11/30 0550) Last BM Date: 12/07/15  Intake/Output from previous day: 11/29 0701 - 11/30 0700 In: 2361.4 [I.V.:2146.4; IV Piggyback:215] Out: 2250 [Urine:300; Emesis/NG output:1950] Intake/Output this shift: No intake/output data recorded.  PE: Gen:  Alert, NAD HEENT: NG tube with bilious output, dry mucosa Card:  irregular Pulm:  CTAB, no rhonchi or wheezing Abd: Soft, mild distension (decreased from yesterday), present but hypoactive BS, no HSM, healed midline incision Ext:  No erythema, edema, or tenderness  Lab Results:   Recent Labs  12/12/15 0123 12/13/15 0410  WBC 7.0 6.9  HGB 8.6* 8.3*  HCT 24.4* 23.8*  PLT 205 207   BMET  Recent Labs  12/11/15 0336 12/12/15 0123  NA 137 139  K 4.0 3.6  CL 105 105  CO2 23 22  GLUCOSE 107* 86  BUN 22* 26*  CREATININE 1.65* 1.93*  CALCIUM 9.9 8.8*   PT/INR No results for input(s): LABPROT, INR in the last 72 hours. CMP     Component Value Date/Time   NA 139 12/12/2015 0123   NA 142 10/30/2015   K 3.6 12/12/2015 0123   CL 105 12/12/2015 0123   CO2 22 12/12/2015 0123   GLUCOSE 86 12/12/2015 0123   BUN 26 (H) 12/12/2015 0123   BUN 9 10/30/2015   CREATININE 1.93 (H) 12/12/2015 0123   CALCIUM 8.8 (L) 12/12/2015 0123   PROT 6.2 (L) 12/12/2015 0123   ALBUMIN 2.9 (L)  12/12/2015 0123   AST 17 12/12/2015 0123   ALT 13 (L) 12/12/2015 0123   ALKPHOS 57 12/12/2015 0123   BILITOT 1.1 12/12/2015 0123   GFRNONAA 22 (L) 12/12/2015 0123   GFRAA 26 (L) 12/12/2015 0123   Lipase     Component Value Date/Time   LIPASE 37 12/03/2015 0645       Studies/Results: Dg Abd 1 View  Result Date: 12/11/2015 CLINICAL DATA:  Abdominal pain and distention for several days EXAM: ABDOMEN - 1 VIEW COMPARISON:  11/17/2015 FINDINGS: There again noted changes consistent with aortic stent graft placement. The overall appearance is stable. IVC filter is noted as well but somewhat obscured by the stent graft. Left external iliac artery stent is noted as well. Scattered large and small bowel gas is noted. Dilated small bowel is noted in the mid abdomen on the right. There is a relative paucity of bowel gas on the left which may be related to distention of the stomach. IMPRESSION: Changes consistent with at least partial small bowel obstruction. Electronically Signed   By: Alcide CleverMark  Lukens M.D.   On: 12/11/2015 12:00    Anti-infectives: Anti-infectives    Start     Dose/Rate Route Frequency Ordered Stop   12/03/15 0830  fluconazole (DIFLUCAN) tablet 150 mg     150 mg Oral  Once 12/03/15 0828 12/03/15 0859   12/02/15 2200  levofloxacin (LEVAQUIN) IVPB 500 mg  Status:  Discontinued     500 mg 100 mL/hr over 60 Minutes Intravenous Every 48 hours 11/30/15 2202 12/03/15 1036   11/30/15 2230  aztreonam (AZACTAM) 500 mg in dextrose 5 % 50 mL IVPB  Status:  Discontinued     500 mg 100 mL/hr over 30 Minutes Intravenous Every 8 hours 11/30/15 2203 12/01/15 0732   11/30/15 2145  levofloxacin (LEVAQUIN) IVPB 750 mg     750 mg 100 mL/hr over 90 Minutes Intravenous  Once 11/30/15 2130 11/30/15 2327   11/30/15 2145  aztreonam (AZACTAM) 2 g in dextrose 5 % 50 mL IVPB  Status:  Discontinued     2 g 100 mL/hr over 30 Minutes Intravenous  Once 11/30/15 2130 11/30/15 2203        Assessment/Plan Recurrent SBO: Hospitalized 10/01/15, 10/28/15, 11/30/15 to present - Prior surgeries include AAA repair, hysterectomy and cholecystectomy - continued high NGT output (1950cc/24hr) - last BM 11/26  Possible CVA 11/14/15 Progressive renal insuffiencey CAD/CHF - EF 40-45% (10/06/15) Atrial fibrillation on anticoagulation at home - On low-dose Cardizem drip now Dementia in SNF Severe deconditioning and malnutrition Hx of seizure OSA 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 Family has decided that they would like to proceed with surgery despite the risks. Will plan for OR tomorrow morning. Continue NPO (ice chips ok until midnight), NGT, IVF. Hold heparin after midnight. Spoke with patient's daughter, [name], on the phone [phone]) and she will be by today around 2PM to sign consent.   LOS: 13 days    [name] , [facility] 12/13/2015, 8:48 AM Pager: [phone] Consults: [phone] Mon-Fri 7:00 am-4:30 pm Sat-Sun 7:00 am-11:30 am

## 2015-12-13 NOTE — Progress Notes (Signed)
PROGRESS NOTE                                                                                                                                                                                                             Patient Demographics:    Brandi Heath, is a 82 y.o. female, DOB - [birth year], ZOX:[id number]  Admit date - 11/30/2015   Admitting Physician Therisa Doyne, MD  Outpatient Primary MD for the patient is Merrilee Seashore, MD  LOS - 13  Outpatient Specialists: none  Chief Complaint  Patient presents with  . Emesis       Brief Narrative  82 year old female with history of A. 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 Even recurrent small bowel obstruction surgery consulted.    Subjective:   Heart rate is still in 110s on Cardizem drip.   Assessment  & Plan :    Principal Problem:   Recurrent SBO (small bowel obstruction) Multiple small bowel obstruction (fourth episode in the past 2 months). Plan on surgery tomorrow. Continue nothing by mouth and NG to suction. Given her low EF and rapid A. fib cardiology consulted for preop clearance. Given her age, comorbidities and poor heart function Recommend she is high risk for surgery.   Active Problems:   Atrial fibrillation with RVR (HCC) Requiring low-dose Cardizem drip. Heart rate in low 100s. On IV heparin. Cardiology recommend IV amiodarone perioperatively for better rate control.  Seizure disorder On IV phenytoin.  Acute kidney injury Secondary to dehydration. Grooming with fluids.  Hypokalemia/hypomagnesemia Replenish potassium with IV fluids. Replenished    Anemia of chronic disease At baseline  Moderate to severe dementia  Severe protein calorie malnutrition       Code Status : DO NOT RESUSCITATE  Family  Communication  : No family at bedside  Disposition Plan  : Pending hospital course, surgical repair.  Barriers For Discharge : Active symptoms  Consults  :   [facility]  Procedures  : None  DVT Prophylaxis  : IV heparin  Lab Results  Component Value Date   PLT 207 12/13/2015    Antibiotics  :    Anti-infectives    Start     Dose/Rate Route Frequency Ordered Stop   12/14/15  0600  clindamycin (CLEOCIN) IVPB 900 mg     900 mg 100 mL/hr over 30 Minutes Intravenous On call to O.R. 12/13/15 1305 12/15/15 0559   12/14/15 0600  gentamicin (GARAMYCIN) 310 mg in dextrose 5 % 100 mL IVPB     5 mg/kg  61.4 kg 107.8 mL/hr over 60 Minutes Intravenous On call to O.R. 12/13/15 1305 12/15/15 0559   12/03/15 0830  fluconazole (DIFLUCAN) tablet 150 mg     150 mg Oral  Once 12/03/15 0828 12/03/15 0859   12/02/15 2200  levofloxacin (LEVAQUIN) IVPB 500 mg  Status:  Discontinued     500 mg 100 mL/hr over 60 Minutes Intravenous Every 48 hours 11/30/15 2202 12/03/15 1036   11/30/15 2230  aztreonam (AZACTAM) 500 mg in dextrose 5 % 50 mL IVPB  Status:  Discontinued     500 mg 100 mL/hr over 30 Minutes Intravenous Every 8 hours 11/30/15 2203 12/01/15 0732   11/30/15 2145  levofloxacin (LEVAQUIN) IVPB 750 mg     750 mg 100 mL/hr over 90 Minutes Intravenous  Once 11/30/15 2130 11/30/15 2327   11/30/15 2145  aztreonam (AZACTAM) 2 g in dextrose 5 % 50 mL IVPB  Status:  Discontinued     2 g 100 mL/hr over 30 Minutes Intravenous  Once 11/30/15 2130 11/30/15 2203        Objective:   Vitals:   12/13/15 0550 12/13/15 0611 12/13/15 0906 12/13/15 1300  BP: (!) 86/57  101/60 100/64  Pulse: (!) 124 (!) 109  (!) 118  Resp: 16   15  Temp: 99.1 F (37.3 C)   99.2 F (37.3 C)  TempSrc: Oral   Axillary  SpO2: 100%   96%  Weight:      Height:        Wt Readings from Last 3 Encounters:  12/08/15 61.4 kg (135 lb 5.8 oz)  11/14/15 66.2 kg (146 lb)  11/14/15 72.6 kg (160 lb)      Intake/Output Summary (Last 24 hours) at 12/13/15 1553 Last data filed at 12/13/15 1526  Gross per 24 hour  Intake          2564.37 ml  Output             2350 ml  Net           214.37 ml     Physical Exam  Gen: Not in distress HEENT: NG tube with bilious output, dry mucosa, supple neck Chest: clear b/l, no added sounds CVS: S1 and S2 irregular, no murmurs GI: soft, mild distention, nontender, bowel sounds absent Musculoskeletal: warm, no edema CNS: Alert and awake,     Data Review:    CBC  Recent Labs Lab 12/08/15 1657 12/09/15 0527 12/10/15 0505 12/10/15 1812 12/12/15 0123 12/13/15 0410  WBC 7.2 8.4 7.3 7.7 7.0 6.9  HGB 9.1* 8.9* 9.4* 9.8* 8.6* 8.3*  HCT 26.4* 26.1* 27.2* 27.8* 24.4* 23.8*  PLT 204 210 202 204 205 207  MCV 82.2 82.6 81.7 80.3 81.6 81.8  MCH 28.3 28.2 28.2 28.3 28.8 28.5  MCHC 34.5 34.1 34.6 35.3 35.2 34.9  RDW 14.5 14.7 14.8 14.6 14.9 14.9  LYMPHSABS 1.2  --   --  1.0  --   --   MONOABS 0.7  --   --  0.8  --   --   EOSABS 0.1  --   --  0.1  --   --   BASOSABS 0.0  --   --  0.0  --   --  Chemistries   Recent Labs Lab 12/07/15 0434 12/08/15 0512 12/09/15 0527 12/10/15 0505 12/11/15 0336 12/12/15 0123 12/13/15 1024  NA 137 139 136 134* 137 139 141  K 4.8 4.9 5.6* 4.8 4.0 3.6 2.8*  CL 107 110 112* 107 105 105 108  CO2 26 24 18* 19* 23 22 23   GLUCOSE 122* 136* 109* 89 107* 86 75  BUN 28* 19 14 15  22* 26* 25*  CREATININE 0.99 1.01* 1.01* 1.27* 1.65* 1.93* 1.50*  CALCIUM 8.3* 8.9 8.7* 9.4 9.9 8.8* 8.8*  MG 1.8 1.6* 1.5*  --  1.9  --  1.6*  AST 18  --   --  21 18 17   --   ALT 18  --   --  14 15 13*  --   ALKPHOS 50  --   --  64 76 57  --   BILITOT 0.6  --   --  0.9 1.3* 1.1  --    ------------------------------------------------------------------------------------------------------------------ No results for input(s): CHOL, HDL, LDLCALC, TRIG, CHOLHDL, LDLDIRECT in the last 72 hours.  Lab Results  Component Value Date    HGBA1C 5.9 (H) 12/03/2015   ------------------------------------------------------------------------------------------------------------------ No results for input(s): TSH, T4TOTAL, T3FREE, THYROIDAB in the last 72 hours.  Invalid input(s): FREET3 ------------------------------------------------------------------------------------------------------------------ No results for input(s): VITAMINB12, FOLATE, FERRITIN, TIBC, IRON, RETICCTPCT in the last 72 hours.  Coagulation profile No results for input(s): INR, PROTIME in the last 168 hours.  No results for input(s): DDIMER in the last 72 hours.  Cardiac Enzymes No results for input(s): CKMB, TROPONINI, MYOGLOBIN in the last 168 hours.  Invalid input(s): CK ------------------------------------------------------------------------------------------------------------------ No results found for: BNP  Inpatient Medications  Scheduled Meds: . chlorhexidine  15 mL Mouth Rinse BID  . Chlorhexidine Gluconate Cloth  6 each Topical Once   And  . [START ON 12/14/2015] Chlorhexidine Gluconate Cloth  6 each Topical Once  . [START ON 12/14/2015] clindamycin (CLEOCIN) IV  900 mg Intravenous On Call to OR   And  . [START ON 12/14/2015] gentamicin  5 mg/kg Intravenous On Call to OR  . levETIRAcetam  750 mg Intravenous Q12H  . lip balm  1 application Topical BID  . mouth rinse  15 mL Mouth Rinse q12n4p  . MUSCLE RUB   Topical QHS  . pantoprazole (PROTONIX) IV  40 mg Intravenous Q12H  . sodium chloride flush  10-40 mL Intracatheter Q12H  . sodium chloride flush  3 mL Intravenous Q12H  . sorbitol  20 mL Oral Daily   Continuous Infusions: . dextrose 5 % 1,000 mL with potassium chloride 40 mEq infusion 50 mL/hr at 12/13/15 1238  . diltiazem (CARDIZEM) infusion 5 mg/hr (12/13/15 1253)  . heparin 900 Units/hr (12/13/15 0544)   PRN Meds:.magic mouthwash, menthol-cetylpyridinium, [DISCONTINUED] ondansetron **OR** ondansetron (ZOFRAN) IV, phenol,  sodium chloride flush  Micro Results No results found for this or any previous visit (from the past 240 hour(s)).  Radiology Reports Ct Abdomen Pelvis Wo Contrast  Result Date: 11/30/2015 CLINICAL DATA:  Vomiting. Generalized abdominal pain and distention. EXAM: CT ABDOMEN AND PELVIS WITHOUT CONTRAST TECHNIQUE: Multidetector CT imaging of the abdomen and pelvis was performed following the standard protocol without IV contrast. COMPARISON:  10/01/2015 FINDINGS: Lower chest: There is cardiomegaly. Chronic opacities, likely scarring in the lung bases. No effusions. Densely calcified coronary arteries noted. There is a moderate-sized hiatal hernia which is fluid-filled. Hepatobiliary: Prior cholecystectomy.  No focal hepatic abnormality. Pancreas: Diffuse atrophy of the pancreas without visualized focal abnormality or ductal dilatation.  Spleen: No focal abnormality.  Normal size. Adrenals/Urinary Tract: Small cyst in the midpole of the right kidney. No hydronephrosis. Adrenal gland is unremarkable. Urinary bladder decompressed. Stomach/Bowel: The stomach is markedly distended with fluid. There are dilated small bowel loops in the right abdomen and extending into the pelvis. Transition point is in the mid pelvis on image 64, presumably related to adhesions. Findings compatible with distal small bowel obstruction. Colon is decompressed, grossly unremarkable. Vascular/Lymphatic: Prior stent graft repair of abdominal aortic aneurysm. The aneurysm sac size is 5.1 cm compared with 5.8 cm previously. No adenopathy. Reproductive: Prior hysterectomy.  No adnexal masses. Other: No free fluid or free air. Musculoskeletal: No acute bony abnormality or focal bone lesion. Diffuse degenerative disc and facet disease in the lumbar spine. IMPRESSION: Distal small bowel obstruction, likely related to adhesions. Moderate-sized fluid-filled hiatal hernia. Coronary artery disease, aortic atherosclerosis. Prior stent graft repair of  AAA. Decreasing aneurysm sac size since prior study. Electronically Signed   By: Charlett Nose M.D.   On: 11/30/2015 20:25   Dg Chest 2 View  Result Date: 11/14/2015 CLINICAL DATA:  Altered mental status. EXAM: CHEST  2 VIEW COMPARISON:  Chest radiograph 10/28/2015 FINDINGS: Cardiomediastinal contours are unchanged. Diffusely increased pulmonary markings remain. There is no focal airspace consolidation. No pneumothorax or sizable pleural effusion. IMPRESSION: 1. No radiographic evidence of pneumonia. 2. Unchanged findings of chronic lung disease with diffusely increased pulmonary markings and bibasilar no nodular opacities. Electronically Signed   By: Deatra Robinson M.D.   On: 11/14/2015 14:16   Dg Abd 1 View  Result Date: 12/11/2015 CLINICAL DATA:  Abdominal pain and distention for several days EXAM: ABDOMEN - 1 VIEW COMPARISON:  11/17/2015 FINDINGS: There again noted changes consistent with aortic stent graft placement. The overall appearance is stable. IVC filter is noted as well but somewhat obscured by the stent graft. Left external iliac artery stent is noted as well. Scattered large and small bowel gas is noted. Dilated small bowel is noted in the mid abdomen on the right. There is a relative paucity of bowel gas on the left which may be related to distention of the stomach. IMPRESSION: Changes consistent with at least partial small bowel obstruction. Electronically Signed   By: Alcide Clever M.D.   On: 12/11/2015 12:00   Dg Abd 1 View  Result Date: 12/01/2015 CLINICAL DATA:  Small bowel obstruction EXAM: ABDOMEN - 1 VIEW COMPARISON:  11/30/2015 FINDINGS: Dilated small bowel loops again noted right abdomen and pelvis compatible with small bowel obstruction, unchanged. NG tube remains in the stomach. Stent graft again noted. IVC filter noted. No free air or visible organomegaly. IMPRESSION: Continued small bowel dilatation compatible with small bowel obstruction. No real change. Electronically  Signed   By: Charlett Nose M.D.   On: 12/01/2015 07:09   Ct Head Wo Contrast  Result Date: 11/14/2015 CLINICAL DATA:  Altered mental status/ lethargy.  Left arm weakness EXAM: CT HEAD WITHOUT CONTRAST TECHNIQUE: Contiguous axial images were obtained from the base of the skull through the vertex without intravenous contrast. COMPARISON:  Brain MRI October 03, 2015 FINDINGS: Brain: There is moderate diffuse atrophy, unchanged. Note that there is fairly severe cerebellar atrophy, likewise stable. There is no intracranial mass, hemorrhage, extra-axial fluid collection, or midline shift. There is patchy small vessel disease in the centra semiovale bilaterally. There is no new gray-white compartment lesion. No acute infarct evident. Vascular: There is no demonstrable hyperdense vessel. There is calcification in the carotid siphon regions  bilaterally. Skull: The bony calvarium appears intact. Sinuses/Orbits: Visualized paranasal sinuses are clear. Orbits appear symmetric bilaterally. Other: Mastoid air cells are clear. IMPRESSION: Diffuse atrophy, stable. Patchy periventricular small vessel disease, stable. No acute infarct is demonstrable on this study. No hemorrhage, mass, or extra-axial fluid collection. Carotid siphon region vascular calcification evident bilaterally. Electronically Signed   By: Bretta Bang III M.D.   On: 11/14/2015 13:54   Mr Brain Wo Contrast  Result Date: 11/14/2015 CLINICAL DATA:  Altered mental status and lethargy EXAM: MRI HEAD WITHOUT CONTRAST TECHNIQUE: Multiplanar, multiecho pulse sequences of the brain and surrounding structures were obtained without intravenous contrast. COMPARISON:  Head CT 11/14/2015, brain MRI 10/03/2015 FINDINGS: Brain: No acute infarct or intraparenchymal hemorrhage. The midline structures are normal. There is beginning confluent hyperintense T2-weighted signal within the periventricular white matter, most often seen in the setting of chronic  microvascular ischemia. There is diffuse atrophy with mild parietal predominance. No mass lesion or midline shift. No hydrocephalus or extra-axial fluid collection. Vascular: Major intracranial arterial and venous sinus flow voids are preserved. Chronic hemosiderin deposition within the right caudate body. Skull and upper cervical spine: The visualized skull base, calvarium, upper cervical spine and extracranial soft tissues are normal. Sinuses/Orbits: No fluid levels or advanced mucosal thickening. No mastoid effusion. Normal orbits. IMPRESSION: 1. No acute intracranial abnormality. 2. Findings of chronic microvascular disease and diffuse atrophy, with mild parietal predominance. Electronically Signed   By: Deatra Robinson M.D.   On: 11/14/2015 19:53   Dg Chest Port 1 View  Result Date: 12/02/2015 CLINICAL DATA:  Post PICC line insertion EXAM: PORTABLE CHEST 1 VIEW COMPARISON:  November 14, 2015 FINDINGS: The heart, hila, and mediastinum are unchanged. The right PICC line terminates near the cavoatrial junction. The distal tip of the NG tube is not assessed. There is tubing coiled back on itself projected over the midline of the upper chest, likely the portion of the NG tube outside dictation. High attenuation material in the lung bases is stable, possibly aspirated material. No other interval changes or acute abnormalities. IMPRESSION: 1. The right PICC line terminates near the caval atrial junction. Electronically Signed   By: Gerome Sam III M.D   On: 12/02/2015 18:49   Dg Abd 2 Views  Result Date: 12/07/2015 CLINICAL DATA:  Small bowel obstruction. EXAM: ABDOMEN - 2 VIEW COMPARISON:  Single-view of the abdomen 12/02/2015 and 12/06/2015. FINDINGS: NG tube remains in place. Mild gaseous distention of a loop of distal small bowel at 3.2 cm is unchanged. No free intraperitoneal air is identified. Cholecystectomy clips, aneurysm coils and aortoiliac stent graft early again seen. IMPRESSION: No marked  change in mild gas distention of small bowel since the most recent examination compatible with small-bowel obstruction. Electronically Signed   By: Drusilla Kanner M.D.   On: 12/07/2015 10:12   Dg Abd Portable 1v  Result Date: 12/06/2015 CLINICAL DATA:  Follow up partial small bowel obstruction. EXAM: PORTABLE ABDOMEN - 1 VIEW COMPARISON:  12/02/2015 dating back to 10/01/2015, including CT abdomen and pelvis 11/30/2015 and 10/01/2015. FINDINGS: Nasogastric tube tip projects over the expected location of the midbody of a J-shaped stomach, unchanged. Several dilated loops of small bowel persist in the abdomen and pelvis, though these loops are less distended than on the examination 4 days ago. Gas and stool is present in the decompressed colon. No suggestion of free air on the supine image. Prior aorto bi-iliac stent grafting for the previously identified abdominal aortic aneurysm. IVC filter in  the lower IVC. Prior embolization of the right internal iliac artery. IMPRESSION: Persistent partial small bowel obstruction which is minimally improved since examination 4 days ago. Electronically Signed   By: Hulan Saas M.D.   On: 12/06/2015 10:56   Dg Abd Portable 1v  Result Date: 12/02/2015 CLINICAL DATA:  Small bowel obstruction. EXAM: PORTABLE ABDOMEN - 1 VIEW COMPARISON:  December 01, 2015 FINDINGS: A small bowel obstruction persists. No contrast is identified on today's study. However, only minimal contrast is seen in stomach previously. NG tube terminates in the left upper quadrant. IMPRESSION: Persistent small bowel obstruction. Electronically Signed   By: Gerome Sam III M.D   On: 12/02/2015 16:01   Dg Abd Portable 1v-small Bowel Obstruction Protocol-initial, 8 Hr Delay  Result Date: 12/01/2015 CLINICAL DATA:  Small bowel obstruction. EXAM: PORTABLE ABDOMEN - 1 VIEW COMPARISON:  December 01, 2015 FINDINGS: The NG tube is stable. A small amount of contrast is seen in the stomach, new in the  interval. No other contrast seen throughout the abdomen. Persistently dilated small bowel loops in the right abdomen consistent with small bowel obstruction. No other change. IMPRESSION: Small bowel obstruction. Electronically Signed   By: Gerome Sam III M.D   On: 12/01/2015 22:49   Dg Abd Portable 1v-small Bowel Protocol-position Verification  Result Date: 12/01/2015 CLINICAL DATA:  Followup small bowel obstruction. Nasogastric tube placement. EXAM: PORTABLE ABDOMEN - 1 VIEW COMPARISON:  12/01/2015 FINDINGS: Nasogastric tube is seen with tip in the body the stomach. Moderately dilated small bowel loops in the right abdomen show no significant change compared to recent study. Aortoiliac stent graft again seen as well as embolization coils adjacent to the right iliac limb of the graft. IVC filter again noted. IMPRESSION: Nasogastric tube tip remains in the body the stomach. No significant change in dilated small bowel loops, consistent with small bowel obstruction. Electronically Signed   By: Myles Rosenthal M.D.   On: 12/01/2015 12:49   Dg Abd Portable 1v  Result Date: 11/30/2015 CLINICAL DATA:  Nasogastric tube placement EXAM: PORTABLE ABDOMEN - 1 VIEW COMPARISON:  11/30/2015 CT FINDINGS: The nasogastric tube extends well into the stomach with tip in the region of the distal gastric body. IMPRESSION: Nasogastric tube extends into the stomach. Electronically Signed   By: Ellery Plunk M.D.   On: 11/30/2015 22:55    Time Spent in minutes  35   Eddie North M.D on 12/13/2015 at 3:53 PM  Between 7am to 7pm - Pager - [phone]  After 7pm go to [email or web address] - password [facility]  [facility] -  Office  [phone]

## 2015-12-13 NOTE — Consult Note (Signed)
Cardiology Consult    Patient ID: Brandi OraRuth Aul MRN: [id number], DOB/AGE: [birth year]   Admit date: 11/30/2015 Date of Consult: 12/13/2015  Primary Physician: Merrilee SeashoreAnne Alexander, MD Reason for Consult: Preoperative Cardiac Evaluation for SBO Primary Cardiologist: Dr. Hanley Haysosario [facility]) Requesting Provider: Dr. Gonzella Lexhungel   History of Present Illness    Brandi Heath is a 82 y.o. female with past medical history of dementia, chronic atrial fibrillation, presumed NICM/ chronic systolic CHF (EF 40-98%40-45% by echo in 09/2015), history of PE/DVT, prior CVA, and seizure disorder who [facility] on 11/30/2015 for evaluation of abdominal pain and acute encephalopathy.  She has experienced 4 admission in the past 6 months. Was last hospitalized from 11/1/ - 11/15/2015 for an AKI. Prior to that, she was admitted from 10/15 - 10/31/2015 for atrial fibrillation with RVR. She was continued on Eliquis 2.5mg  BID for anticoagulation along with Cardizem CD 360mg  daily and Lopressor 100mg  BID. Other admission have been related to prior small bowel obstructions (current admission is her 3rd for SBP since 09/2015).  CT Abdomen this admission showed a distal SBO, likely related to adhesions. An NG tube was placed for hopes of conservative management. She was also in atrial fibrillation with RVR on admission. PTA Eliquis, Cardizem, and Lopressor were held due to her NPO status. She was started on a Heparin drip and Cardizem drip at time of admission. She became hypotensive on Cardizem and was started on IV Amiodarone on 12/01/2015.  She initially was not progressing with conservative management. Palliative Care was consulted to address Goals of Care as the patient nor her family wished for her to undergo surgery. However, on 11/25, she began to pass stool again.   Over the following days, she began to have significant output from her NG tube. Repeat imaging on 11/28 was consistent with a partial SBO. Surgery  was reconsulted as her current condition is not going to improve without operative measures. This has been discussed with the patient and her family who wish to proceed with surgery knowing it will be a high-risk procedure.  In talking with the patient, she is difficult to arouse and quickly falls back asleep. She denies any current discomfort. Denies any chest pain or palpitations. When asked about any prior cardiac history she states " I don't know". She appears comfortable and in no acute distress.    Past Medical History   Past Medical History:  Diagnosis Date  . CHF (congestive heart failure) (HCC)   . Clotting disorder (HCC)   . Coronary artery disease   . Dementia without behavioral disturbance   . DVT (deep venous thrombosis) (HCC)   . Hyperlipidemia   . Hypertension   . OSA (obstructive sleep apnea)   . PE (pulmonary embolism)   . Persistent atrial fibrillation (HCC)   . Polyneuropathy (HCC)   . SBO (small bowel obstruction)   . Seizures (HCC)   . Stroke [facility](HCC)     Past Surgical History:  Procedure Laterality Date  . ABDOMINAL AORTIC ANEURYSM REPAIR    . ABDOMINAL HYSTERECTOMY    . CHOLECYSTECTOMY    . SPINAL FUSION  2003     Allergies  Allergies  Allergen Reactions  . Chocolate Diarrhea  . Lactose Intolerance (Gi) Diarrhea  . Penicillins Other (See Comments)    Reaction:  Unknown  Has patient had a PCN reaction causing immediate rash, facial/tongue/throat swelling, SOB or lightheadedness with hypotension: Unsure Has patient had a PCN reaction causing severe rash involving mucus  membranes or skin necrosis: Unsure Has patient had a PCN reaction that required hospitalization:  Unsure  Has patient had a PCN reaction occurring within the last 10 years: Unsure If all of the above answers are "NO", then may proceed with Cephalosporin use.    Inpatient Medications    . chlorhexidine  15 mL Mouth Rinse BID  . Chlorhexidine Gluconate Cloth  6 each Topical Once   And    . [START ON 12/14/2015] Chlorhexidine Gluconate Cloth  6 each Topical Once  . feeding supplement (ENSURE ENLIVE)  237 mL Oral TID BM  . levETIRAcetam  750 mg Intravenous Q12H  . lip balm  1 application Topical BID  . mouth rinse  15 mL Mouth Rinse q12n4p  . MUSCLE RUB   Topical QHS  . pantoprazole (PROTONIX) IV  40 mg Intravenous Q12H  . sodium chloride flush  10-40 mL Intracatheter Q12H  . sodium chloride flush  3 mL Intravenous Q12H  . sorbitol  20 mL Oral Daily    Family History    Family History  Problem Relation Age of Onset  . Hypertension Mother   . Heart disease Mother   . Heart disease Father   . Cancer Sister     breast  . Diabetes Sister   . Heart disease Son   . Hypertension Son   . Thyroid disease Sister     Social History    Social History   Social History  . Marital status: Widowed    Spouse name: N/A  . Number of children: N/A  . Years of education: N/A   Occupational History  . Not on file.   Social History Main Topics  . Smoking status: Never Smoker  . Smokeless tobacco: Never Used  . Alcohol use No  . Drug use: Unknown  . Sexual activity: No   Other Topics Concern  . Not on file   Social History Narrative  . No narrative on file     Review of Systems    General:  No chills, fever, night sweats or weight changes.  Cardiovascular:  No chest pain, dyspnea on exertion, edema, orthopnea, palpitations, paroxysmal nocturnal dyspnea. Dermatological: No rash, lesions/masses Respiratory: No cough, dyspnea Urologic: No hematuria, dysuria Abdominal:   No vomiting, diarrhea, bright red blood per rectum, melena, or hematemesis. Positive for abdominal pain and nausea.  Neurologic:  No visual changes, wkns, changes in mental status. All other systems reviewed and are otherwise negative except as noted above.  Physical Exam    Blood pressure 101/60, pulse (!) 109, temperature 99.1 F (37.3 C), temperature source Oral, resp. rate 16, height 5\' 9"   (1.753 m), weight 135 lb 5.8 oz (61.4 kg), SpO2 100 %.  General: Pleasant, elderly African American female appearing in NAD Psych: Normal affect. Neuro: Alert and oriented X 1 (person). Moves all extremities spontaneously. HEENT: NG tube in place with drainage present.  Neck: Supple without bruits or JVD. Lungs:  Resp regular and unlabored, CTA without wheezing or rales. Heart: Irregularly irregular no s3, s4, 2/6 SEM at Apex. Abdomen: Soft, non-tender, appears distended, BS + x 4.  Extremities: No clubbing, cyanosis or edema. DP/PT/Radials 2+ and equal bilaterally.  Labs    Troponin (Point of Care Test) No results for input(s): TROPIPOC in the last 72 hours. No results for input(s): CKTOTAL, CKMB, TROPONINI in the last 72 hours. Lab Results  Component Value Date   WBC 6.9 12/13/2015   HGB 8.3 (L) 12/13/2015   HCT 23.8 (  L) 12/13/2015   MCV 81.8 12/13/2015   PLT 207 12/13/2015    Recent Labs Lab 12/12/15 0123  NA 139  K 3.6  CL 105  CO2 22  BUN 26*  CREATININE 1.93*  CALCIUM 8.8*  PROT 6.2*  BILITOT 1.1  ALKPHOS 57  ALT 13*  AST 17  GLUCOSE 86   Lab Results  Component Value Date   CHOL 74 10/04/2015   HDL 31 (L) 10/04/2015   LDLCALC 28 10/04/2015   TRIG 88 12/04/2015   No results found for: [facility]   Radiology Studies    Ct Abdomen Pelvis Wo Contrast  Result Date: 11/30/2015 CLINICAL DATA:  Vomiting. Generalized abdominal pain and distention. EXAM: CT ABDOMEN AND PELVIS WITHOUT CONTRAST TECHNIQUE: Multidetector CT imaging of the abdomen and pelvis was performed following the standard protocol without IV contrast. COMPARISON:  10/01/2015 FINDINGS: Lower chest: There is cardiomegaly. Chronic opacities, likely scarring in the lung bases. No effusions. Densely calcified coronary arteries noted. There is a moderate-sized hiatal hernia which is fluid-filled. Hepatobiliary: Prior cholecystectomy.  No focal hepatic abnormality. Pancreas: Diffuse atrophy of the pancreas  without visualized focal abnormality or ductal dilatation. Spleen: No focal abnormality.  Normal size. Adrenals/Urinary Tract: Small cyst in the midpole of the right kidney. No hydronephrosis. Adrenal gland is unremarkable. Urinary bladder decompressed. Stomach/Bowel: The stomach is markedly distended with fluid. There are dilated small bowel loops in the right abdomen and extending into the pelvis. Transition point is in the mid pelvis on image 64, presumably related to adhesions. Findings compatible with distal small bowel obstruction. Colon is decompressed, grossly unremarkable. Vascular/Lymphatic: Prior stent graft repair of abdominal aortic aneurysm. The aneurysm sac size is 5.1 cm compared with 5.8 cm previously. No adenopathy. Reproductive: Prior hysterectomy.  No adnexal masses. Other: No free fluid or free air. Musculoskeletal: No acute bony abnormality or focal bone lesion. Diffuse degenerative disc and facet disease in the lumbar spine. IMPRESSION: Distal small bowel obstruction, likely related to adhesions. Moderate-sized fluid-filled hiatal hernia. Coronary artery disease, aortic atherosclerosis. Prior stent graft repair of AAA. Decreasing aneurysm sac size since prior study. Electronically Signed   By: Charlett Nose M.D.   On: 11/30/2015 20:25    Dg Abd 1 View  Result Date: 12/11/2015 CLINICAL DATA:  Abdominal pain and distention for several days EXAM: ABDOMEN - 1 VIEW COMPARISON:  11/17/2015 FINDINGS: There again noted changes consistent with aortic stent graft placement. The overall appearance is stable. IVC filter is noted as well but somewhat obscured by the stent graft. Left external iliac artery stent is noted as well. Scattered large and small bowel gas is noted. Dilated small bowel is noted in the mid abdomen on the right. There is a relative paucity of bowel gas on the left which may be related to distention of the stomach. IMPRESSION: Changes consistent with at least partial small bowel  obstruction. Electronically Signed   By: Alcide Clever M.D.   On: 12/11/2015 12:00   Dg Abd 1 View  Result Date: 12/01/2015 CLINICAL DATA:  Small bowel obstruction EXAM: ABDOMEN - 1 VIEW COMPARISON:  11/30/2015 FINDINGS: Dilated small bowel loops again noted right abdomen and pelvis compatible with small bowel obstruction, unchanged. NG tube remains in the stomach. Stent graft again noted. IVC filter noted. No free air or visible organomegaly. IMPRESSION: Continued small bowel dilatation compatible with small bowel obstruction. No real change. Electronically Signed   By: Charlett Nose M.D.   On: 12/01/2015 07:09   Dg Chest  Port 1 View  Result Date: 12/02/2015 CLINICAL DATA:  Post PICC line insertion EXAM: PORTABLE CHEST 1 VIEW COMPARISON:  November 14, 2015 FINDINGS: The heart, hila, and mediastinum are unchanged. The right PICC line terminates near the cavoatrial junction. The distal tip of the NG tube is not assessed. There is tubing coiled back on itself projected over the midline of the upper chest, likely the portion of the NG tube outside dictation. High attenuation material in the lung bases is stable, possibly aspirated material. No other interval changes or acute abnormalities. IMPRESSION: 1. The right PICC line terminates near the caval atrial junction. Electronically Signed   By: Gerome Sam III M.D   On: 12/02/2015 18:49   Dg Abd 2 Views  Result Date: 12/07/2015 CLINICAL DATA:  Small bowel obstruction. EXAM: ABDOMEN - 2 VIEW COMPARISON:  Single-view of the abdomen 12/02/2015 and 12/06/2015. FINDINGS: NG tube remains in place. Mild gaseous distention of a loop of distal small bowel at 3.2 cm is unchanged. No free intraperitoneal air is identified. Cholecystectomy clips, aneurysm coils and aortoiliac stent graft early again seen. IMPRESSION: No marked change in mild gas distention of small bowel since the most recent examination compatible with small-bowel obstruction. Electronically Signed    By: Drusilla Kanner M.D.   On: 12/07/2015 10:12   Dg Abd Portable 1v  Result Date: 12/06/2015 CLINICAL DATA:  Follow up partial small bowel obstruction. EXAM: PORTABLE ABDOMEN - 1 VIEW COMPARISON:  12/02/2015 dating back to 10/01/2015, including CT abdomen and pelvis 11/30/2015 and 10/01/2015. FINDINGS: Nasogastric tube tip projects over the expected location of the midbody of a J-shaped stomach, unchanged. Several dilated loops of small bowel persist in the abdomen and pelvis, though these loops are less distended than on the examination 4 days ago. Gas and stool is present in the decompressed colon. No suggestion of free air on the supine image. Prior aorto bi-iliac stent grafting for the previously identified abdominal aortic aneurysm. IVC filter in the lower IVC. Prior embolization of the right internal iliac artery. IMPRESSION: Persistent partial small bowel obstruction which is minimally improved since examination 4 days ago. Electronically Signed   By: Hulan Saas M.D.   On: 12/06/2015 10:56   Dg Abd Portable 1v  Result Date: 12/02/2015 CLINICAL DATA:  Small bowel obstruction. EXAM: PORTABLE ABDOMEN - 1 VIEW COMPARISON:  December 01, 2015 FINDINGS: A small bowel obstruction persists. No contrast is identified on today's study. However, only minimal contrast is seen in stomach previously. NG tube terminates in the left upper quadrant. IMPRESSION: Persistent small bowel obstruction. Electronically Signed   By: Gerome Sam III M.D   On: 12/02/2015 16:01   Dg Abd Portable 1v-small Bowel Obstruction Protocol-initial, 8 Hr Delay  Result Date: 12/01/2015 CLINICAL DATA:  Small bowel obstruction. EXAM: PORTABLE ABDOMEN - 1 VIEW COMPARISON:  December 01, 2015 FINDINGS: The NG tube is stable. A small amount of contrast is seen in the stomach, new in the interval. No other contrast seen throughout the abdomen. Persistently dilated small bowel loops in the right abdomen consistent with small  bowel obstruction. No other change. IMPRESSION: Small bowel obstruction. Electronically Signed   By: Gerome Sam III M.D   On: 12/01/2015 22:49   Dg Abd Portable 1v-small Bowel Protocol-position Verification  Result Date: 12/01/2015 CLINICAL DATA:  Followup small bowel obstruction. Nasogastric tube placement. EXAM: PORTABLE ABDOMEN - 1 VIEW COMPARISON:  12/01/2015 FINDINGS: Nasogastric tube is seen with tip in the body the stomach. Moderately dilated small  bowel loops in the right abdomen show no significant change compared to recent study. Aortoiliac stent graft again seen as well as embolization coils adjacent to the right iliac limb of the graft. IVC filter again noted. IMPRESSION: Nasogastric tube tip remains in the body the stomach. No significant change in dilated small bowel loops, consistent with small bowel obstruction. Electronically Signed   By: Myles Rosenthal M.D.   On: 12/01/2015 12:49   Dg Abd Portable 1v  Result Date: 11/30/2015 CLINICAL DATA:  Nasogastric tube placement EXAM: PORTABLE ABDOMEN - 1 VIEW COMPARISON:  11/30/2015 CT FINDINGS: The nasogastric tube extends well into the stomach with tip in the region of the distal gastric body. IMPRESSION: Nasogastric tube extends into the stomach. Electronically Signed   By: Ellery Plunk M.D.   On: 11/30/2015 22:55    EKG & Cardiac Imaging    EKG: 11/30/2015: Atrial fibrillation with RVR, HR 126, mild ST elevation in Lead III.   Echocardiogram: 09/2015 Study Conclusions  - Left ventricle: The cavity size was normal. Systolic function was   mildly to moderately reduced. The estimated ejection fraction was   in the range of 40% to 45%. Wall motion was normal; there were no   regional wall motion abnormalities. The study was not technically   sufficient to allow evaluation of LV diastolic dysfunction due to   atrial fibrillation. - Aortic valve: Trileaflet; moderately thickened, mildly calcified   leaflets. There was mild  regurgitation. - Mitral valve: Mild diffuse calcification of the anterior leaflet.   There was moderate to severe regurgitation. - Left atrium: The atrium was severely dilated. - Right ventricle: Systolic function was mildly reduced. - Right atrium: There is a mobile thin density stranded density in   the RA that could represent chiari but in setting of TIA and   interatrial septal aneurysm, recommend TEE for further   evaluation. The atrium was moderately dilated. 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 No thrombus was   identified within the aneurysm. - Tricuspid valve: There was mild-moderate regurgitation. - Pulmonary arteries: PA peak pressure: 63 mm Hg (S).  Impressions:  - The right ventricular systolic pressure was increased consistent   with moderate pulmonary hypertension.  Assessment & Plan    1. Preoperative Evaluation for SBO - currently admitted for the 3rd time in the past 2 months for recurrent SBO. Initially conservative management was favored and her SBO resolved but represented on 11/28. Surgery was reconsulted as her current condition is not going to improve without operative measures.  - the patient has dementia at baseline and is unable to contribute to her PMH. She denies any current chest discomfort, dyspnea, or palpitations.  - overall, she is at high-risk for the procedure due to her multiple medical conditions, advanced age, dementia, chronic systolic CHF, and mod-severe MR. She is not a candidate for further cardiac testing prior to surgery.   2. Nonischemic cardiomyopathy/ Chronic Systolic CHF - EF 40-45% by echo in 09/2015. Presumed to be nonischemic by review of past notes.  - does not appear volume overloaded on physical exam. Monitor fluid status closely while receiving IVF.   3. Chronic Atrial Fibrillation - This patients CHA2DS2-VASc Score and unadjusted Ischemic Stroke Rate  (% per year) is equal to 10.8 % stroke rate/year from a score of 8 (CHF, HTN, Vascular, Female, Age (2), Stroke (2)). PTA Eliquis 2.5mg  BUD currently on hold. On Heparin. - PTA  Cardizem CD and Lopressor held as she is NPO. Continue Cardizem drip. Cannot titrate currently with episodes of hypotension. If HR becomes significantly elevated following surgery, will need to consider re-initiation of IV Amiodarone.   4. Moderate to Severe MR - moderate to severe mitral regurgitation with a peak gradient of 4 mmHg. - not a surgical candidate for repair.   5. Dementia - appears to be at baseline. Only A&Ox1.   Signed, Ellsworth Lennox, PA-C 12/13/2015, 10:54 AM Pager: [phone]  I have personally seen and examined this patient with Randall An, PA-C. I agree with the assessment and plan as outlined above. Ms. Mascaro is a 82 yo female with dementia with recurrent SBO. She is being offered surgical correction which seems to be mostly a palliative approach. She is known to have a Non-ischemic cardiomyopathy with LVEF=40-45% by echo in Sept 2017. She has chronic atrial fibrillation.  My exam shows a cachectic female in NAD. She is only oriented to person [id number] irreg tachy with systolic murmur noted. Lungs: clear bilaterally. Ext: no edema From the standpoint of operative risk, she is high risk. She is not a candidate for any cardiac testing in her condition. I would proceed with surgery if family realizes it is high risk and the only alternative.  She is currently on IV Cardizem with poor rate control. I would consider using IV amiodarone in the per-operative period for better rate control.   Verne Carrow 12/13/2015 12:08 PM

## 2015-12-13 NOTE — Progress Notes (Signed)
ANTICOAGULATION CONSULT NOTE - Follow Up Consult  Pharmacy Consult for Heparin restart Indication: atrial fibrillation, hx of stroke, hx of DVT/PE  Patient Measurements: Height: 5\' 9"  (175.3 cm) Weight: 135 lb 5.8 oz (61.4 kg) IBW/kg (Calculated) : 66.2 Heparin Dosing Weight: actual weight  Medications:  Infusions:  . dextrose 5 % and 0.45% NaCl 50 mL/hr at 12/13/15 1036  . diltiazem (CARDIZEM) infusion 5 mg/hr (12/12/15 0357)  . heparin 900 Units/hr (12/13/15 0544)    Assessment: 59 yoF admitted with UTI/sepsis. PMH significant for a-fib, stroke, and DVT/PE on oral anticoagulation PTA with Eliquis, held for NPO status, SBO.  Pharmacy consulted to dose IV heparin for while Eliquis is on hold.  Eliquis was resumed 11/26 but orders to change back to heparin gtt 11/28 for worsening SBO.  Last dose of apixaban 11/27 at 2200, dose not given 11/28 am due to vomiting  Today, 12/13/2015:  Heparin level and aPTT both therapeutic and appear to be correlating (HL still likely elevated, but aPTT confirms rate is not too low)  CBC- Hgb low/down overnight, pltc WNL   No bleeding or complications reported per discussion with RN   Goal of Therapy:  Heparin level 0.3-0.7 units/mL APTT 66-102 sec Monitor platelets by anticoagulation protocol: Yes   Plan:   Continue heparin drip @ 900 units/hr  Planning for OR tomorrow despite high cardiac risk, holding heparin after midnight - f/u when to resume  Daily heparin level and CBC (will cancel level for tomorrow AM)  Monitor for signs of bleeding or thrombosis  Bernadene Person, PharmD, BCPS Pager: [phone] 12/13/2015, 10:56 AM

## 2015-12-13 NOTE — Progress Notes (Signed)
Nutrition Follow-up  DOCUMENTATION CODES:   Severe malnutrition in context of acute illness/injury, Underweight  INTERVENTION:  - RD will follow-up 12/1 for POC concerning nutrition.   NUTRITION DIAGNOSIS:   Inadequate oral intake related to inability to eat as evidenced by NPO status. -revised, ongoing  GOAL:   Patient will meet greater than or equal to 90% of their needs -unable to meet  MONITOR:   Weight trends, Labs, Skin, I & O's, Other (Comment) (plan concerning nutrition)  ASSESSMENT:   82 y.o. female with medical history significant of  CAD, dementia, DVT, OSA, D, atrial fibrillation on anticoagulation, recurrent small bowel obstruction, CHF nonischemic cardiomyopathy with mitral regurgitation, seizure disorder, recent stroke, As with abdominal pain, vomiting and altered mental status.  11/30 Pt has been NPO since 11/28 @ 1847. Per notes, pt with recurrence of SBO and NGT was subsequently placed around lunch time on 11/28 and had 4L output by 1430 that day. Surgery was re-consulted and pt and family wish to proceed with surgery; will monitor for this and associated POC concerning nutrition support.  No new weight since 11/25.  Medications reviewed; 750 mg IV Keppra BID, PRN IV Zofran, 40 mg IV Protonix BID, 20 mL oral sorbitol/day. Labs reviewed; CBG: 73 mg/dL, BUN: 26 mg/dL, creatinine: 4.66 mg/dL, Ca: 8.8 mg/dL, GFR: 22 mL/min.   IVF: D5-40 mEq KCl @ 50 mL/hr (204 kcal).    11/27 - TPN d/c'd 11/26.  - Patient now advanced to soft diet.  - Went by room today. Patient not really communicating well and seems confused but reports poor appetite.  - Spoke to RN, reports that patient is eating about 25% meals.  - No V/D.   - Patient has lost 10lbs(7%) over 3 weeks. This is significant.  - Continue to monitor for refeeding. Mag and Phos low 11/26; values not rechecked today.      Diet Order:  Diet NPO time specified Except for: Ice Chips Diet NPO time specified  Skin:   Wound (see comment) (Unstageable, full thickness pressure injury to sacrum)  Last BM:  11/24  Height:   Ht Readings from Last 1 Encounters:  12/01/15 5\' 9"  (1.753 m)    Weight:   Wt Readings from Last 1 Encounters:  12/08/15 135 lb 5.8 oz (61.4 kg)    Ideal Body Weight:  65.9 kg  BMI:  Body mass index is 19.99 kg/m.  Estimated Nutritional Needs:   Kcal:  1350-1600  Protein:  70-80g  Fluid:  1.5-1.7L/day  EDUCATION NEEDS:   No education needs identified at this time    Trenton Gammon, MS, RD, LDN, CNSC Inpatient Clinical Dietitian Pager # [phone] After hours/weekend pager # [phone]

## 2015-12-13 NOTE — Progress Notes (Signed)
ANTICOAGULATION CONSULT NOTE - Follow Up Consult  Pharmacy Consult for Heparin restart Indication: atrial fibrillation, hx of stroke, hx of DVT/PE  Patient Measurements: Height: 5\' 9"  (175.3 cm) Weight: 135 lb 5.8 oz (61.4 kg) IBW/kg (Calculated) : 66.2 Heparin Dosing Weight: actual weight  Medications:  Infusions:  . sodium chloride 75 mL/hr at 12/12/15 0100  . diltiazem (CARDIZEM) infusion 5 mg/hr (12/12/15 0357)  . heparin      Assessment: 59 yoF admitted with UTI/sepsis. PMH significant for a-fib, stroke, and DVT/PE on oral anticoagulation PTA with Eliquis, held for NPO status, SBO.  Pharmacy consulted to dose IV heparin for while Eliquis is on hold.  Eliquis was resumed 11/26 but orders to change back to heparin gtt 11/28 for worsening SBO.  Last dose of apixaban 11/27 at 2200, dose not given 11/28 am due to vomiting   11/29  Heparin level supratherapeutic, aPTT essentially therapeutic   Await correlation of heparin level and aPTT.  Apixaban interfering with anti-Xa assay  CBC- Hgb low/down overnight, pltc WNL   No bleeding or complications reported per discussion with RN  Today, 11/30  0410 HL=0.74 and aPtt=76- starting to correlate but HL still above desired range while aPtt is therapeutic.  No infusion problems or bleeding per RN.  Goal of Therapy:  Heparin level 0.3-0.7 units/mL APTT 66-102 sec Monitor platelets by anticoagulation protocol: Yes   Plan:   Continue heparin drip @ 900 units/hr  Will recheck HL and aPtt today.  Daily heparin level and CBC  Monitor for signs of bleeding or thrombosis  Brandi Heath 12/13/2015 5:24 AM

## 2015-12-14 ENCOUNTER — Encounter (HOSPITAL_COMMUNITY): Admission: EM | Disposition: A | Discharge status: Skilled Nursing Facility | Payer: Self-pay | Source: Home / Self Care

## 2015-12-14 ENCOUNTER — Inpatient Hospital Stay (HOSPITAL_COMMUNITY): Payer: Medicare Other | Admitting: Registered Nurse

## 2015-12-14 DIAGNOSIS — N178 Other acute kidney failure: Secondary | ICD-10-CM

## 2015-12-14 DIAGNOSIS — Z9889 Other specified postprocedural states: Secondary | ICD-10-CM

## 2015-12-14 HISTORY — PX: LAPAROTOMY: SHX154

## 2015-12-14 HISTORY — PX: INCISION AND DRAINAGE ABSCESS: SHX5864

## 2015-12-14 HISTORY — PX: LYSIS OF ADHESION: SHX5961

## 2015-12-14 LAB — BASIC METABOLIC PANEL
Anion gap: 8 (ref 5–15)
BUN: 18 mg/dL (ref 6–20)
CHLORIDE: 102 mmol/L (ref 101–111)
CO2: 24 mmol/L (ref 22–32)
CREATININE: 1.24 mg/dL — AB (ref 0.44–1.00)
Calcium: 8.4 mg/dL — ABNORMAL LOW (ref 8.9–10.3)
GFR calc Af Amer: 44 mL/min — ABNORMAL LOW (ref >60)
GFR, EST NON AFRICAN AMERICAN: 38 mL/min — AB (ref >60)
GLUCOSE: 250 mg/dL — AB (ref 65–99)
POTASSIUM: 4.4 mmol/L (ref 3.5–5.1)
SODIUM: 134 mmol/L — AB (ref 135–145)

## 2015-12-14 LAB — POCT I-STAT 7, (LYTES, BLD GAS, ICA,H+H)
ACID-BASE DEFICIT: 3 mmol/L — AB (ref 0.0–2.0)
BICARBONATE: 22.3 mmol/L (ref 20.0–28.0)
CALCIUM ION: 1.2 mmol/L (ref 1.15–1.40)
HEMATOCRIT: 35 % — AB (ref 36.0–46.0)
HEMOGLOBIN: 11.9 g/dL — AB (ref 12.0–15.0)
O2 Saturation: 100 %
PH ART: 7.355 (ref 7.350–7.450)
POTASSIUM: 3.6 mmol/L (ref 3.5–5.1)
SODIUM: 139 mmol/L (ref 135–145)
TCO2: 24 mmol/L (ref 0–100)
pCO2 arterial: 40 mmHg (ref 32.0–48.0)
pO2, Arterial: 400 mmHg — ABNORMAL HIGH (ref 83.0–108.0)

## 2015-12-14 LAB — CBC
HCT: 22.2 % — ABNORMAL LOW (ref 36.0–46.0)
Hemoglobin: 7.8 g/dL — ABNORMAL LOW (ref 12.0–15.0)
MCH: 28.7 pg (ref 26.0–34.0)
MCHC: 35.1 g/dL (ref 30.0–36.0)
MCV: 81.6 fL (ref 78.0–100.0)
PLATELETS: 197 10*3/uL (ref 150–400)
RBC: 2.72 MIL/uL — AB (ref 3.87–5.11)
RDW: 14.8 % (ref 11.5–15.5)
WBC: 5.8 10*3/uL (ref 4.0–10.5)

## 2015-12-14 LAB — GLUCOSE, CAPILLARY
GLUCOSE-CAPILLARY: 111 mg/dL — AB (ref 65–99)
GLUCOSE-CAPILLARY: 139 mg/dL — AB (ref 65–99)
Glucose-Capillary: 116 mg/dL — ABNORMAL HIGH (ref 65–99)

## 2015-12-14 LAB — PREPARE RBC (CROSSMATCH)

## 2015-12-14 SURGERY — LAPAROTOMY, EXPLORATORY
Anesthesia: General

## 2015-12-14 MED ORDER — ROCURONIUM BROMIDE 50 MG/5ML IV SOSY
PREFILLED_SYRINGE | INTRAVENOUS | Status: AC
  Filled 2015-12-14: qty 5

## 2015-12-14 MED ORDER — FENTANYL CITRATE (PF) 100 MCG/2ML IJ SOLN
25.0000 ug | INTRAMUSCULAR | Status: DC | PRN
  Administered 2015-12-14 (×4): 25 ug via INTRAVENOUS

## 2015-12-14 MED ORDER — LATANOPROST 0.005 % OP SOLN
1.0000 [drp] | Freq: Every day | OPHTHALMIC | Status: DC
  Administered 2015-12-14 – 2015-12-24 (×11): 1 [drp] via OPHTHALMIC
  Filled 2015-12-14: qty 2.5

## 2015-12-14 MED ORDER — ROCURONIUM BROMIDE 100 MG/10ML IV SOLN
INTRAVENOUS | Status: DC | PRN
  Administered 2015-12-14: 50 mg via INTRAVENOUS
  Administered 2015-12-14: 20 mg via INTRAVENOUS

## 2015-12-14 MED ORDER — METOPROLOL TARTRATE 5 MG/5ML IV SOLN
INTRAVENOUS | Status: AC
  Filled 2015-12-14: qty 5

## 2015-12-14 MED ORDER — SODIUM CHLORIDE 0.9 % IV SOLN
Freq: Once | INTRAVENOUS | Status: AC
  Administered 2015-12-14: 13:00:00 via INTRAVENOUS

## 2015-12-14 MED ORDER — GLYCOPYRROLATE 0.2 MG/ML IV SOSY
PREFILLED_SYRINGE | INTRAVENOUS | Status: AC
  Filled 2015-12-14: qty 12

## 2015-12-14 MED ORDER — SODIUM CHLORIDE 0.9 % IV SOLN
INTRAVENOUS | Status: DC | PRN
  Administered 2015-12-14: 14:00:00 via INTRAVENOUS

## 2015-12-14 MED ORDER — SUGAMMADEX SODIUM 500 MG/5ML IV SOLN
INTRAVENOUS | Status: AC
  Filled 2015-12-14: qty 5

## 2015-12-14 MED ORDER — PROPOFOL 10 MG/ML IV BOLUS
INTRAVENOUS | Status: AC
  Filled 2015-12-14: qty 20

## 2015-12-14 MED ORDER — METOPROLOL TARTRATE 5 MG/5ML IV SOLN
INTRAVENOUS | Status: DC | PRN
  Administered 2015-12-14: 2.5 mg via INTRAVENOUS

## 2015-12-14 MED ORDER — LIDOCAINE 2% (20 MG/ML) 5 ML SYRINGE
INTRAMUSCULAR | Status: AC
  Filled 2015-12-14: qty 5

## 2015-12-14 MED ORDER — FENTANYL CITRATE (PF) 100 MCG/2ML IJ SOLN
INTRAMUSCULAR | Status: AC
  Filled 2015-12-14: qty 2

## 2015-12-14 MED ORDER — ONDANSETRON HCL 4 MG/2ML IJ SOLN
INTRAMUSCULAR | Status: AC
Start: 2015-12-14 — End: 2015-12-14
  Filled 2015-12-14: qty 2

## 2015-12-14 MED ORDER — PHENYLEPHRINE HCL 10 MG/ML IJ SOLN
INTRAMUSCULAR | Status: AC
  Filled 2015-12-14: qty 1

## 2015-12-14 MED ORDER — SUGAMMADEX SODIUM 200 MG/2ML IV SOLN
INTRAVENOUS | Status: AC
  Filled 2015-12-14: qty 2

## 2015-12-14 MED ORDER — PHENYLEPHRINE HCL 10 MG/ML IJ SOLN
INTRAMUSCULAR | Status: AC
Start: 2015-12-14 — End: 2015-12-14
  Filled 2015-12-14: qty 3

## 2015-12-14 MED ORDER — LIP MEDEX EX OINT
TOPICAL_OINTMENT | CUTANEOUS | Status: AC
  Administered 2015-12-14: 1
  Filled 2015-12-14: qty 7

## 2015-12-14 MED ORDER — ETOMIDATE 2 MG/ML IV SOLN
INTRAVENOUS | Status: DC | PRN
  Administered 2015-12-14: 12 mg via INTRAVENOUS

## 2015-12-14 MED ORDER — ESMOLOL HCL 100 MG/10ML IV SOLN
INTRAVENOUS | Status: AC
  Filled 2015-12-14: qty 10

## 2015-12-14 MED ORDER — SUGAMMADEX SODIUM 200 MG/2ML IV SOLN
INTRAVENOUS | Status: DC | PRN
  Administered 2015-12-14: 200 mg via INTRAVENOUS

## 2015-12-14 MED ORDER — ROCURONIUM BROMIDE 50 MG/5ML IV SOSY
PREFILLED_SYRINGE | INTRAVENOUS | Status: AC
Start: 2015-12-14 — End: 2015-12-14
  Filled 2015-12-14: qty 5

## 2015-12-14 MED ORDER — SUCCINYLCHOLINE CHLORIDE 200 MG/10ML IV SOSY
PREFILLED_SYRINGE | INTRAVENOUS | Status: AC
  Filled 2015-12-14: qty 10

## 2015-12-14 MED ORDER — DEXAMETHASONE SODIUM PHOSPHATE 10 MG/ML IJ SOLN
INTRAMUSCULAR | Status: AC
  Filled 2015-12-14: qty 7

## 2015-12-14 MED ORDER — ONDANSETRON HCL 4 MG/2ML IJ SOLN
INTRAMUSCULAR | Status: AC
  Filled 2015-12-14: qty 2

## 2015-12-14 MED ORDER — CLINDAMYCIN PHOSPHATE 900 MG/50ML IV SOLN
INTRAVENOUS | Status: AC
  Filled 2015-12-14: qty 50

## 2015-12-14 MED ORDER — MORPHINE SULFATE (PF) 2 MG/ML IV SOLN
1.0000 mg | INTRAVENOUS | Status: DC | PRN
  Administered 2015-12-14 – 2015-12-15 (×3): 1 mg via INTRAVENOUS
  Administered 2015-12-16: 2 mg via INTRAVENOUS
  Administered 2015-12-20 – 2015-12-24 (×2): 1 mg via INTRAVENOUS
  Filled 2015-12-14 (×6): qty 1

## 2015-12-14 MED ORDER — ESMOLOL HCL 100 MG/10ML IV SOLN
INTRAVENOUS | Status: DC | PRN
  Administered 2015-12-14 (×2): 10 mg via INTRAVENOUS

## 2015-12-14 MED ORDER — PHENYLEPHRINE HCL 10 MG/ML IJ SOLN
INTRAVENOUS | Status: DC | PRN
  Administered 2015-12-14: 40 ug/min via INTRAVENOUS

## 2015-12-14 MED ORDER — LACTATED RINGERS IV SOLN
INTRAVENOUS | Status: DC | PRN
  Administered 2015-12-14: 13:00:00 via INTRAVENOUS

## 2015-12-14 MED ORDER — FENTANYL CITRATE (PF) 100 MCG/2ML IJ SOLN
INTRAMUSCULAR | Status: DC | PRN
  Administered 2015-12-14 (×6): 50 ug via INTRAVENOUS

## 2015-12-14 MED ORDER — FENTANYL CITRATE (PF) 100 MCG/2ML IJ SOLN
INTRAMUSCULAR | Status: AC
  Filled 2015-12-14: qty 4

## 2015-12-14 MED ORDER — LABETALOL HCL 5 MG/ML IV SOLN
INTRAVENOUS | Status: AC
  Filled 2015-12-14: qty 4

## 2015-12-14 MED ORDER — SUCCINYLCHOLINE CHLORIDE 20 MG/ML IJ SOLN
INTRAMUSCULAR | Status: DC | PRN
  Administered 2015-12-14: 100 mg via INTRAVENOUS

## 2015-12-14 SURGICAL SUPPLY — 52 items
APPLICATOR COTTON TIP 6IN STRL (MISCELLANEOUS) ×8 IMPLANT
BLADE EXTENDED COATED 6.5IN (ELECTRODE) IMPLANT
BLADE HEX COATED 2.75 (ELECTRODE) ×4 IMPLANT
BLADE SURG SZ10 CARB STEEL (BLADE) ×4 IMPLANT
BNDG GAUZE ELAST 4 BULKY (GAUZE/BANDAGES/DRESSINGS) IMPLANT
COVER MAYO STAND STRL (DRAPES) ×4 IMPLANT
COVER SURGICAL LIGHT HANDLE (MISCELLANEOUS) ×8 IMPLANT
DECANTER SPIKE VIAL GLASS SM (MISCELLANEOUS) IMPLANT
DRAPE LAPAROSCOPIC ABDOMINAL (DRAPES) ×4 IMPLANT
DRAPE SHEET LG 3/4 BI-LAMINATE (DRAPES) IMPLANT
DRAPE WARM FLUID 44X44 (DRAPE) ×4 IMPLANT
DRSG OPSITE POSTOP 4X6 (GAUZE/BANDAGES/DRESSINGS) ×4 IMPLANT
DRSG PAD ABDOMINAL 8X10 ST (GAUZE/BANDAGES/DRESSINGS) ×8 IMPLANT
ELECT REM PT RETURN 9FT ADLT (ELECTROSURGICAL) ×4
ELECTRODE REM PT RTRN 9FT ADLT (ELECTROSURGICAL) ×2 IMPLANT
GAUZE SPONGE 4X4 12PLY STRL (GAUZE/BANDAGES/DRESSINGS) ×4 IMPLANT
GLOVE BIO SURGEON STRL SZ7.5 (GLOVE) ×4 IMPLANT
GLOVE BIOGEL PI IND STRL 7.0 (GLOVE) ×2 IMPLANT
GLOVE BIOGEL PI IND STRL 7.5 (GLOVE) ×2 IMPLANT
GLOVE BIOGEL PI INDICATOR 7.0 (GLOVE) ×2
GLOVE BIOGEL PI INDICATOR 7.5 (GLOVE) ×2
GLOVE ECLIPSE 7.5 STRL STRAW (GLOVE) ×8 IMPLANT
GOWN STRL REUS W/TWL LRG LVL3 (GOWN DISPOSABLE) ×4 IMPLANT
GOWN STRL REUS W/TWL XL LVL3 (GOWN DISPOSABLE) ×8 IMPLANT
HANDLE SUCTION POOLE (INSTRUMENTS) ×2 IMPLANT
HEMOSTAT SURGICEL 4X8 (HEMOSTASIS) ×8 IMPLANT
KIT BASIN OR (CUSTOM PROCEDURE TRAY) ×4 IMPLANT
NEEDLE HYPO 25X1 1.5 SAFETY (NEEDLE) IMPLANT
NS IRRIG 1000ML POUR BTL (IV SOLUTION) ×8 IMPLANT
PACK GENERAL/GYN (CUSTOM PROCEDURE TRAY) ×4 IMPLANT
SEALER TISSUE X1 CVD JAW (INSTRUMENTS) IMPLANT
SHEARS HARMONIC ACE PLUS 36CM (ENDOMECHANICALS) IMPLANT
SPONGE LAP 18X18 X RAY DECT (DISPOSABLE) ×8 IMPLANT
STAPLER VISISTAT 35W (STAPLE) ×4 IMPLANT
SUCTION POOLE HANDLE (INSTRUMENTS) ×4
SUT MNCRL AB 4-0 PS2 18 (SUTURE) IMPLANT
SUT PDS AB 1 CT1 27 (SUTURE) ×8 IMPLANT
SUT SILK 2 0 (SUTURE) ×2
SUT SILK 2 0 SH CR/8 (SUTURE) ×4 IMPLANT
SUT SILK 2 0SH CR/8 30 (SUTURE) IMPLANT
SUT SILK 2-0 18XBRD TIE 12 (SUTURE) ×2 IMPLANT
SUT SILK 2-0 30XBRD TIE 12 (SUTURE) IMPLANT
SUT SILK 3 0 (SUTURE) ×4
SUT SILK 3 0 SH CR/8 (SUTURE) ×4 IMPLANT
SUT SILK 3-0 18XBRD TIE 12 (SUTURE) ×4 IMPLANT
SUT VIC AB 3-0 54XBRD REEL (SUTURE) IMPLANT
SUT VIC AB 3-0 BRD 54 (SUTURE)
SWAB COLLECTION DEVICE MRSA (MISCELLANEOUS) IMPLANT
SYR CONTROL 10ML LL (SYRINGE) IMPLANT
TOWEL OR 17X26 10 PK STRL BLUE (TOWEL DISPOSABLE) ×8 IMPLANT
TOWEL OR NON WOVEN STRL DISP B (DISPOSABLE) IMPLANT
YANKAUER SUCT BULB TIP NO VENT (SUCTIONS) ×4 IMPLANT

## 2015-12-14 NOTE — Anesthesia Procedure Notes (Signed)
Procedure Name: Intubation Date/Time: 12/14/2015 1:42 PM Performed by: Illene Silver Pre-anesthesia Checklist: Patient identified, Emergency Drugs available, Suction available and Patient being monitored Patient Re-evaluated:Patient Re-evaluated prior to inductionOxygen Delivery Method: Circle system utilized Preoxygenation: Pre-oxygenation with 100% oxygen Intubation Type: IV induction, Rapid sequence and Cricoid Pressure applied Laryngoscope Size: Mac and 4 Grade View: Grade II Tube type: Oral Tube size: 7.5 (Subglottic ETT Taper Tube) mm Number of attempts: 1 Airway Equipment and Method: Stylet and Oral airway Placement Confirmation: ETT inserted through vocal cords under direct vision,  positive ETCO2 and breath sounds checked- equal and bilateral Tube secured with: Tape Dental Injury: Teeth and Oropharynx as per pre-operative assessment

## 2015-12-14 NOTE — Progress Notes (Signed)
PROGRESS NOTE                                                                                                                                                                                                             Patient Demographics:    Brandi Heath, is a 82 y.o. female, DOB - [birth year], SJG:[id number]  Admit date - 11/30/2015   Admitting Physician Therisa Doyne, MD  Outpatient Primary MD for the patient is Merrilee Seashore, MD  LOS - 14  Outpatient Specialists: none  Chief Complaint  Patient presents with  . Emesis       Brief Narrative  82 year old female with history of A. 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 Even recurrent small bowel obstruction surgery consulted.    Subjective:   Heart rate is still in 110s on Cardizem drip.   Assessment  & Plan :    Principal Problem:   Recurrent SBO (small bowel obstruction) Multiple small bowel obstruction (fourth episode in the past 2 months). Plan on surgery tomorrow. Continue nothing by mouth and NG to suction. Given her low EF and rapid A. fib cardiology consulted for preop clearance. Given her age, comorbidities and poor heart function Recommend she is high cardiac risk for surgery.   Active Problems:   Atrial fibrillation with RVR (HCC) Requiring low-dose Cardizem drip. Heart rate in low 100s. On IV heparin. Cardiology recommend IV amiodarone perioperatively for better rate control.  Seizure disorder On IV phenytoin.  Acute kidney injury Secondary to dehydration. Improving with fluids  Hypokalemia/hypomagnesemia Replenish potassium with IV fluids. Replenished  Severe protein calorie malnutrition needs TPN postop.  Anemia of chronic disease At baseline. Transfuse if <7    Moderate to severe  dementia         Code Status : DO NOT RESUSCITATE  Family Communication  : Daughter at bedside  Disposition Plan  : Pending hospital course, surgical repair.  Barriers For Discharge : Active symptoms  Consults  :   [facility]  Procedures  : None  DVT Prophylaxis  : IV heparin  Lab Results  Component Value Date   PLT 197 12/14/2015    Antibiotics  :    Anti-infectives    Start  Dose/Rate Route Frequency Ordered Stop   12/14/15 0600  clindamycin (CLEOCIN) IVPB 900 mg     900 mg 100 mL/hr over 30 Minutes Intravenous On call to O.R. 12/13/15 1305 12/15/15 0559   12/14/15 0600  gentamicin (GARAMYCIN) 310 mg in dextrose 5 % 100 mL IVPB     5 mg/kg  61.4 kg 107.8 mL/hr over 60 Minutes Intravenous On call to O.R. 12/13/15 1305 12/15/15 0559   12/03/15 0830  fluconazole (DIFLUCAN) tablet 150 mg     150 mg Oral  Once 12/03/15 0828 12/03/15 0859   12/02/15 2200  levofloxacin (LEVAQUIN) IVPB 500 mg  Status:  Discontinued     500 mg 100 mL/hr over 60 Minutes Intravenous Every 48 hours 11/30/15 2202 12/03/15 1036   11/30/15 2230  aztreonam (AZACTAM) 500 mg in dextrose 5 % 50 mL IVPB  Status:  Discontinued     500 mg 100 mL/hr over 30 Minutes Intravenous Every 8 hours 11/30/15 2203 12/01/15 0732   11/30/15 2145  levofloxacin (LEVAQUIN) IVPB 750 mg     750 mg 100 mL/hr over 90 Minutes Intravenous  Once 11/30/15 2130 11/30/15 2327   11/30/15 2145  aztreonam (AZACTAM) 2 g in dextrose 5 % 50 mL IVPB  Status:  Discontinued     2 g 100 mL/hr over 30 Minutes Intravenous  Once 11/30/15 2130 11/30/15 2203        Objective:   Vitals:   12/14/15 1316 12/14/15 1317 12/14/15 1318 12/14/15 1319  BP:      Pulse: 70 67 97 83  Resp: (!) 28 19 (!) 21   Temp:      TempSrc:      SpO2: 98% 98% 93% 94%  Weight:      Height:        Wt Readings from Last 3 Encounters:  12/14/15 61.4 kg (135 lb 5.8 oz)  11/14/15 66.2 kg (146 lb)  11/14/15 72.6 kg (160 lb)      Intake/Output Summary (Last 24 hours) at 12/14/15 1421 Last data filed at 12/14/15 1400  Gross per 24 hour  Intake          1574.33 ml  Output             2800 ml  Net         -1225.67 ml     Physical Exam  Gen: Not in distress HEENT: NG tube with bilious output, dry mucosa, supple neck Chest: clear b/l, no added sounds CVS: S1 and S2 irregular, no murmurs GI: soft, mild distention, nontender, bowel sounds absent Musculoskeletal: warm, no edema     Data Review:    CBC  Recent Labs Lab 12/08/15 1657  12/10/15 0505 12/10/15 1812 12/12/15 0123 12/13/15 0410 12/14/15 0429  WBC 7.2  < > 7.3 7.7 7.0 6.9 5.8  HGB 9.1*  < > 9.4* 9.8* 8.6* 8.3* 7.8*  HCT 26.4*  < > 27.2* 27.8* 24.4* 23.8* 22.2*  PLT 204  < > 202 204 205 207 197  MCV 82.2  < > 81.7 80.3 81.6 81.8 81.6  MCH 28.3  < > 28.2 28.3 28.8 28.5 28.7  MCHC 34.5  < > 34.6 35.3 35.2 34.9 35.1  RDW 14.5  < > 14.8 14.6 14.9 14.9 14.8  LYMPHSABS 1.2  --   --  1.0  --   --   --   MONOABS 0.7  --   --  0.8  --   --   --  EOSABS 0.1  --   --  0.1  --   --   --   BASOSABS 0.0  --   --  0.0  --   --   --   < > = values in this interval not displayed.  Chemistries   Recent Labs Lab 12/08/15 0512 12/09/15 0527 12/10/15 0505 12/11/15 0336 12/12/15 0123 12/13/15 1024 12/14/15 0429  NA 139 136 134* 137 139 141 134*  K 4.9 5.6* 4.8 4.0 3.6 2.8* 4.4  CL 110 112* 107 105 105 108 102  CO2 24 18* 19* 23 22 23 24   GLUCOSE 136* 109* 89 107* 86 75 250*  BUN 19 14 15  22* 26* 25* 18  CREATININE 1.01* 1.01* 1.27* 1.65* 1.93* 1.50* 1.24*  CALCIUM 8.9 8.7* 9.4 9.9 8.8* 8.8* 8.4*  MG 1.6* 1.5*  --  1.9  --  1.6*  --   AST  --   --  21 18 17   --   --   ALT  --   --  14 15 13*  --   --   ALKPHOS  --   --  64 76 57  --   --   BILITOT  --   --  0.9 1.3* 1.1  --   --    ------------------------------------------------------------------------------------------------------------------ No results for input(s): CHOL, HDL,  LDLCALC, TRIG, CHOLHDL, LDLDIRECT in the last 72 hours.  Lab Results  Component Value Date   HGBA1C 5.9 (H) 12/03/2015   ------------------------------------------------------------------------------------------------------------------ No results for input(s): TSH, T4TOTAL, T3FREE, THYROIDAB in the last 72 hours.  Invalid input(s): FREET3 ------------------------------------------------------------------------------------------------------------------ No results for input(s): VITAMINB12, FOLATE, FERRITIN, TIBC, IRON, RETICCTPCT in the last 72 hours.  Coagulation profile No results for input(s): INR, PROTIME in the last 168 hours.  No results for input(s): DDIMER in the last 72 hours.  Cardiac Enzymes No results for input(s): CKMB, TROPONINI, MYOGLOBIN in the last 168 hours.  Invalid input(s): CK ------------------------------------------------------------------------------------------------------------------ No results found for: BNP  Inpatient Medications  Scheduled Meds: . [MAR Hold] chlorhexidine  15 mL Mouth Rinse BID  . clindamycin (CLEOCIN) IV  900 mg Intravenous On Call to OR   And  . gentamicin  5 mg/kg Intravenous On Call to OR  . [MAR Hold] levETIRAcetam  750 mg Intravenous Q12H  . [MAR Hold] lip balm  1 application Topical BID  . [MAR Hold] mouth rinse  15 mL Mouth Rinse q12n4p  . [MAR Hold] MUSCLE RUB   Topical QHS  . [MAR Hold] pantoprazole (PROTONIX) IV  40 mg Intravenous Q12H  . [MAR Hold] sodium chloride flush  10-40 mL Intracatheter Q12H  . [MAR Hold] sodium chloride flush  3 mL Intravenous Q12H  . [MAR Hold] sorbitol  20 mL Oral Daily   Continuous Infusions: . dextrose 5 % 1,000 mL with potassium chloride 40 mEq infusion 50 mL/hr at 12/14/15 0252  . [MAR Hold] diltiazem (CARDIZEM) infusion 10 mg/hr (12/14/15 1409)   PRN Meds:.[MAR Hold] magic mouthwash, [MAR Hold] menthol-cetylpyridinium, [DISCONTINUED] ondansetron **OR** [MAR Hold] ondansetron (ZOFRAN)  IV, [MAR Hold] phenol, [MAR Hold] sodium chloride flush  Micro Results No results found for this or any previous visit (from the past 240 hour(s)).  Radiology Reports Ct Abdomen Pelvis Wo Contrast  Result Date: 11/30/2015 CLINICAL DATA:  Vomiting. Generalized abdominal pain and distention. EXAM: CT ABDOMEN AND PELVIS WITHOUT CONTRAST TECHNIQUE: Multidetector CT imaging of the abdomen and pelvis was performed following the standard protocol without IV contrast. COMPARISON:  10/01/2015 FINDINGS: Lower  chest: There is cardiomegaly. Chronic opacities, likely scarring in the lung bases. No effusions. Densely calcified coronary arteries noted. There is a moderate-sized hiatal hernia which is fluid-filled. Hepatobiliary: Prior cholecystectomy.  No focal hepatic abnormality. Pancreas: Diffuse atrophy of the pancreas without visualized focal abnormality or ductal dilatation. Spleen: No focal abnormality.  Normal size. Adrenals/Urinary Tract: Small cyst in the midpole of the right kidney. No hydronephrosis. Adrenal gland is unremarkable. Urinary bladder decompressed. Stomach/Bowel: The stomach is markedly distended with fluid. There are dilated small bowel loops in the right abdomen and extending into the pelvis. Transition point is in the mid pelvis on image 64, presumably related to adhesions. Findings compatible with distal small bowel obstruction. Colon is decompressed, grossly unremarkable. Vascular/Lymphatic: Prior stent graft repair of abdominal aortic aneurysm. The aneurysm sac size is 5.1 cm compared with 5.8 cm previously. No adenopathy. Reproductive: Prior hysterectomy.  No adnexal masses. Other: No free fluid or free air. Musculoskeletal: No acute bony abnormality or focal bone lesion. Diffuse degenerative disc and facet disease in the lumbar spine. IMPRESSION: Distal small bowel obstruction, likely related to adhesions. Moderate-sized fluid-filled hiatal hernia. Coronary artery disease, aortic  atherosclerosis. Prior stent graft repair of AAA. Decreasing aneurysm sac size since prior study. Electronically Signed   By: Charlett NoseKevin  Dover M.D.   On: 11/30/2015 20:25   Dg Abd 1 View  Result Date: 12/11/2015 CLINICAL DATA:  Abdominal pain and distention for several days EXAM: ABDOMEN - 1 VIEW COMPARISON:  11/17/2015 FINDINGS: There again noted changes consistent with aortic stent graft placement. The overall appearance is stable. IVC filter is noted as well but somewhat obscured by the stent graft. Left external iliac artery stent is noted as well. Scattered large and small bowel gas is noted. Dilated small bowel is noted in the mid abdomen on the right. There is a relative paucity of bowel gas on the left which may be related to distention of the stomach. IMPRESSION: Changes consistent with at least partial small bowel obstruction. Electronically Signed   By: Alcide CleverMark  Lukens M.D.   On: 12/11/2015 12:00   Dg Abd 1 View  Result Date: 12/01/2015 CLINICAL DATA:  Small bowel obstruction EXAM: ABDOMEN - 1 VIEW COMPARISON:  11/30/2015 FINDINGS: Dilated small bowel loops again noted right abdomen and pelvis compatible with small bowel obstruction, unchanged. NG tube remains in the stomach. Stent graft again noted. IVC filter noted. No free air or visible organomegaly. IMPRESSION: Continued small bowel dilatation compatible with small bowel obstruction. No real change. Electronically Signed   By: Charlett NoseKevin  Dover M.D.   On: 12/01/2015 07:09   Mr Brain Wo Contrast  Result Date: 11/14/2015 CLINICAL DATA:  Altered mental status and lethargy EXAM: MRI HEAD WITHOUT CONTRAST TECHNIQUE: Multiplanar, multiecho pulse sequences of the brain and surrounding structures were obtained without intravenous contrast. COMPARISON:  Head CT 11/14/2015, brain MRI 10/03/2015 FINDINGS: Brain: No acute infarct or intraparenchymal hemorrhage. The midline structures are normal. There is beginning confluent hyperintense T2-weighted signal  within the periventricular white matter, most often seen in the setting of chronic microvascular ischemia. There is diffuse atrophy with mild parietal predominance. No mass lesion or midline shift. No hydrocephalus or extra-axial fluid collection. Vascular: Major intracranial arterial and venous sinus flow voids are preserved. Chronic hemosiderin deposition within the right caudate body. Skull and upper cervical spine: The visualized skull base, calvarium, upper cervical spine and extracranial soft tissues are normal. Sinuses/Orbits: No fluid levels or advanced mucosal thickening. No mastoid effusion. Normal orbits. IMPRESSION: 1.  No acute intracranial abnormality. 2. Findings of chronic microvascular disease and diffuse atrophy, with mild parietal predominance. Electronically Signed   By: Deatra Robinson M.D.   On: 11/14/2015 19:53   Dg Chest Port 1 View  Result Date: 12/02/2015 CLINICAL DATA:  Post PICC line insertion EXAM: PORTABLE CHEST 1 VIEW COMPARISON:  November 14, 2015 FINDINGS: The heart, hila, and mediastinum are unchanged. The right PICC line terminates near the cavoatrial junction. The distal tip of the NG tube is not assessed. There is tubing coiled back on itself projected over the midline of the upper chest, likely the portion of the NG tube outside dictation. High attenuation material in the lung bases is stable, possibly aspirated material. No other interval changes or acute abnormalities. IMPRESSION: 1. The right PICC line terminates near the caval atrial junction. Electronically Signed   By: Gerome Sam III M.D   On: 12/02/2015 18:49   Dg Abd 2 Views  Result Date: 12/07/2015 CLINICAL DATA:  Small bowel obstruction. EXAM: ABDOMEN - 2 VIEW COMPARISON:  Single-view of the abdomen 12/02/2015 and 12/06/2015. FINDINGS: NG tube remains in place. Mild gaseous distention of a loop of distal small bowel at 3.2 cm is unchanged. No free intraperitoneal air is identified. Cholecystectomy clips,  aneurysm coils and aortoiliac stent graft early again seen. IMPRESSION: No marked change in mild gas distention of small bowel since the most recent examination compatible with small-bowel obstruction. Electronically Signed   By: Drusilla Kanner M.D.   On: 12/07/2015 10:12   Dg Abd Portable 1v  Result Date: 12/06/2015 CLINICAL DATA:  Follow up partial small bowel obstruction. EXAM: PORTABLE ABDOMEN - 1 VIEW COMPARISON:  12/02/2015 dating back to 10/01/2015, including CT abdomen and pelvis 11/30/2015 and 10/01/2015. FINDINGS: Nasogastric tube tip projects over the expected location of the midbody of a J-shaped stomach, unchanged. Several dilated loops of small bowel persist in the abdomen and pelvis, though these loops are less distended than on the examination 4 days ago. Gas and stool is present in the decompressed colon. No suggestion of free air on the supine image. Prior aorto bi-iliac stent grafting for the previously identified abdominal aortic aneurysm. IVC filter in the lower IVC. Prior embolization of the right internal iliac artery. IMPRESSION: Persistent partial small bowel obstruction which is minimally improved since examination 4 days ago. Electronically Signed   By: Hulan Saas M.D.   On: 12/06/2015 10:56   Dg Abd Portable 1v  Result Date: 12/02/2015 CLINICAL DATA:  Small bowel obstruction. EXAM: PORTABLE ABDOMEN - 1 VIEW COMPARISON:  December 01, 2015 FINDINGS: A small bowel obstruction persists. No contrast is identified on today's study. However, only minimal contrast is seen in stomach previously. NG tube terminates in the left upper quadrant. IMPRESSION: Persistent small bowel obstruction. Electronically Signed   By: Gerome Sam III M.D   On: 12/02/2015 16:01   Dg Abd Portable 1v-small Bowel Obstruction Protocol-initial, 8 Hr Delay  Result Date: 12/01/2015 CLINICAL DATA:  Small bowel obstruction. EXAM: PORTABLE ABDOMEN - 1 VIEW COMPARISON:  December 01, 2015 FINDINGS: The  NG tube is stable. A small amount of contrast is seen in the stomach, new in the interval. No other contrast seen throughout the abdomen. Persistently dilated small bowel loops in the right abdomen consistent with small bowel obstruction. No other change. IMPRESSION: Small bowel obstruction. Electronically Signed   By: Gerome Sam III M.D   On: 12/01/2015 22:49   Dg Abd Portable 1v-small Bowel Protocol-position Verification  Result Date:  12/01/2015 CLINICAL DATA:  Followup small bowel obstruction. Nasogastric tube placement. EXAM: PORTABLE ABDOMEN - 1 VIEW COMPARISON:  12/01/2015 FINDINGS: Nasogastric tube is seen with tip in the body the stomach. Moderately dilated small bowel loops in the right abdomen show no significant change compared to recent study. Aortoiliac stent graft again seen as well as embolization coils adjacent to the right iliac limb of the graft. IVC filter again noted. IMPRESSION: Nasogastric tube tip remains in the body the stomach. No significant change in dilated small bowel loops, consistent with small bowel obstruction. Electronically Signed   By: Myles Rosenthal M.D.   On: 12/01/2015 12:49   Dg Abd Portable 1v  Result Date: 11/30/2015 CLINICAL DATA:  Nasogastric tube placement EXAM: PORTABLE ABDOMEN - 1 VIEW COMPARISON:  11/30/2015 CT FINDINGS: The nasogastric tube extends well into the stomach with tip in the region of the distal gastric body. IMPRESSION: Nasogastric tube extends into the stomach. Electronically Signed   By: Ellery Plunk M.D.   On: 11/30/2015 22:55    Time Spent in minutes  35   Eddie North M.D on 12/14/2015 at 2:21 PM  Between 7am to 7pm - Pager - [phone]  After 7pm go to [email or web address] - password [facility]  [facility] -  Office  [phone]

## 2015-12-14 NOTE — Plan of Care (Signed)
Problem: Fluid Volume: Goal: Ability to maintain a balanced intake and output will improve Outcome: Not Progressing Pt still NPO with NG tube in place.

## 2015-12-14 NOTE — Progress Notes (Signed)
eLink Physician-Brief Progress Note Patient Name: Brandi Heath DOB: December 19, 1927 MRN: [id number]   Date of Service  12/14/2015  HPI/Events of Note  New patient evaluation.   Patient with dementia, chronic kidney disease, CHF with EF 45%.,  comes in with recurrent SBO.  Surgery has been following patient.  Patient went to OR today for EL, lysis of adhesion, debridement of sacral decubitus ulcer. She was extubated postop. She was transferred to ICU on Neo-Synephrine. Neo-Synephrine drip has since been discontinued.  Patient seen, comfortable. Not in distress. Blood pressure 100 systolic. Heart rate 105, O2 saturation 90% on nasal cannula.  On IVF.   Pt is a full DNR.    eICU Interventions  Neo-Synephrine has been weaned off. She is currently in ICU. She is a full DO NOT RESUSCITATE. I expect she'll be in stepdown unit tomorrow.   I discussed the case with Dr. Montez Morita [facility]. Pt being seen by [facility] and surgery.      Intervention Category Evaluation Type: New Patient Evaluation  Louann Sjogren 12/14/2015, 7:17 PM

## 2015-12-14 NOTE — Progress Notes (Signed)
Decubitus ulcer I&D wound class 4 Exploratory laparotomy wound class 1

## 2015-12-14 NOTE — Op Note (Addendum)
Preoperative Diagnosis: #1 small bowel obstruction #2 sacral decubitus ulcer  Postoprative Diagnosis: #1 small bowel obstruction secondary to adhesions #2 stage III sacral decubitus ulcer  Procedure: Procedure(s): EXPLORATORY LAPAROTOMY Enterolysis  for mechanical small bowel obstruction DEBRIDMENT OF SACRAL DECUBITUS ULCER, skin subcutaneous tissue and fascia 6 x 6 cm   Surgeon: Glenna FellowsHoxworth, Maygan Koeller T   Assistants: Evangeline GulaBrooke Miller PA  Anesthesia:  General endotracheal anesthesia  Indications: Patient is an 82 year old female with multiple medical problems who has had readmissions and recurrent small bowel obstruction over the last couple of months that initially responded to conservative management and then quickly recurs. She was recently readmitted with recurrent small bowel  Obstruction confirmed by CT scan with an apparent transition point in the right pelvis and history of total abdominal hysterectomy bilateral salpingo-oophorectomy. She has not progressed with follow-up plain x-ray showing persistent small bowel obstruction. She is also presenting with a sacral decubitus ulcer which was present on admission unstageable with overlying necrotic tissue. I have had extensive discussion with the family and to some degree with the patient although she has a degree of dementia. We have discussed options including comfort care only versus proceeding with an attempt to relieve her obstruction. We have discussed that with her age and overall condition this is a high risk procedure but that without surgery she will be unable to survive. After extensive discussion and careful consideration by the family they would like to proceed with surgery in an attempt to relieve her bowel obstruction. They understand it is high risk. We have discussed risks of cardiorespiratory complications, bleeding, infection and wound healing problems. The patient states that she does desire surgery. She again has a necrotic sacral  decubitus ulcer which will plan to debride as well.    Procedure Detail:  Patient was brought to the operating room, placed in the supine position on the operating table, and general endotracheal anesthesia induced. Foley catheter was in place. Nasogastric tube was in place. PAS were placed. The abdomen was widely sterilely prepped and draped and patient timeout was performed and correct procedure verified. The patient had a dense midline scar which was elliptically excised in the lower abdomen and dissection carried down through the subcutaneous tissue and midline fascia. The peritoneum was entered under direct vision. There were a few omental adhesions up to the midline incision were taken down with cautery. The abdominal wall was noted to be fairly stiff and difficult to retract although adequate exposure was able to be obtained. There were fairly dense omental adhesions down into the pelvis over the bladder peritoneum on either side of the pelvic sidewall. There were significantly distended loops of proximal small bowel. These omental adhesions were carefully exposed and taken down with cautery until the omentum could be taken up out of the pelvis with the transverse colon. There were noted to be markedly distended loops of proximal small bowel and we could see some completely decompressed terminal ileum in the pelvis. There were unfortunately extensive pelvic small bowel adhesions mainly to the right pelvic side wall and to the sacrum. It appeared that this area had been previously dissected, possibly a lymph node dissection. Extensive adhesio lysis was performed with numerous dense small bowel adhesions over a wide area. Staying close to the small bowel I slowly was able to dissect this up out of the pelvis. The ureter was identified on the right and interestingly has not really covered with peritoneum with adhesions directly over the ureter. It was carefully protected. I  then identified the point of  obstruction which was in the distal ileum where there was a tight kink with an adhesion to the right pelvic sidewall. As this adhesion was dissected out it was actually posterior to the right ureter and the ureter had to be retracted a little medially to expose this and lifted out. At this point the entire small bowel could be brought up out of the pelvis and the obstruction was relieved. There was some oozing from the pelvic sidewall which was packed with dry gauze. We then carefully examined the small intestine. Proximal small intestine was markedly dilated. This was carefully milked back into the stomach and about 2 L of fluid was removed with the NG tube with good decompression of the proximal small bowel. There were some significant raw areas along the distal small bowel where we had taken down adhesions and a couple of minor serosal splits but no full-thickness injuries. The area that had been obstructed was slightly chronically narrowed but passed fluid easily and distended reasonably well and I did not feel it needed to be resected. A very small serosal injury less than a centimeter at this area with intact mucosa was closed with interrupted 3-0 silk's. We again ran the bowel from ileocecal valve back to the ligament of Treitz and no further contents into the stomach to decompress the bowel. There was some continued slight oozing from raw areas in the pelvis and pelvic sidewall. Surgicel was placed over this with pressure for several minutes and this appeared to achieve adequate hemostasis. The small bowel was carefully replaced in its anatomic position. The omentum was placed over this. The midline fascia was closed using running #1 PDS started at either end of the incision and tied centrally.  Skin was closed with staples. Dry sterile dressing was applied.  Following this the patient was carefully positioned left lateral decubitus position on a bean bag and the sacral decubitus ulcer exposed and  sterilely prepped and draped. This measured 7 x 6 cm. Obviously necrotic through most of the central portion of the wound. Sharp debridement was then performed with a knife and with cautery excising skin back to bleeding edges and deeply excising the center portion of the ulcer removing necrotic subcutaneous tissue and some necrotic fascia over muscle. The overall size of the debridement was approximately 5 x 6 x 2 cm skin, subcutaneous tissue and fascia. At this point the wound had punctate bleeding in all areas. Hemostasis was obtained with cautery and was packed with moist saline gauze. Sterile dressing was applied.    Findings: As above  Estimated Blood Loss:  300 mL         Drains: None  Blood Given: 500 CC PRBC          Specimens: Skin and subcutaneous tissue and fascia from sacral decubitus        Complications:  * No complications entered in OR log *         Disposition: Patient is being evaluated for possible extubation or transfer to ICU intubated at the time of this dictation.         Condition: stable

## 2015-12-14 NOTE — Anesthesia Postprocedure Evaluation (Signed)
Anesthesia Post Note  Patient: Brandi Heath  Procedure(s) Performed: Procedure(s) (LRB): EXPLORATORY LAPAROTOMY (N/A) DEBRIDMENT OF SACRAL DECUBITUS ULCER (N/A) LYSIS OF ADHESION  Patient location during evaluation: PACU Anesthesia Type: General Level of consciousness: awake and patient cooperative Pain management: pain level controlled Vital Signs Assessment: post-procedure vital signs reviewed and stable Respiratory status: spontaneous breathing and patient connected to nasal cannula oxygen Cardiovascular status: blood pressure returned to baseline and tachycardic Postop Assessment: no signs of nausea or vomiting Anesthetic complications: no    Last Vitals:  Vitals:   12/14/15 1800 12/14/15 1840  BP:    Pulse:    Resp: 14 11  Temp:      Last Pain:  Vitals:   12/14/15 0900  TempSrc:   PainSc: 0-No pain                 [name]

## 2015-12-14 NOTE — Care Management Important Message (Signed)
Important Message  Patient Details  Name: Brandi Heath MRN: [id number] Date of Birth: 25-Nov-1927   Medicare Important Message Given:  Yes    Haskell Flirt 12/14/2015, 11:48 AMImportant Message  Patient Details  Name: Brandi Heath MRN: [id number] Date of Birth: [birth year]   Medicare Important Message Given:  Yes    Haskell Flirt 12/14/2015, 11:48 AM

## 2015-12-14 NOTE — Transfer of Care (Signed)
Immediate Anesthesia Transfer of Care Note  Patient: Brandi Heath  Procedure(s) Performed: Procedure(s): EXPLORATORY LAPAROTOMY (N/A) DEBRIDMENT OF SACRAL DECUBITUS ULCER (N/A) LYSIS OF ADHESION  Patient Location: PACU  Anesthesia Type:General  Level of Consciousness:  sedated, patient cooperative and responds to stimulation  Airway & Oxygen Therapy:Patient Spontanous Breathing and Patient connected to face mask oxgen  Post-op Assessment:  Report given to PACU RN and Post -op Vital signs reviewed and stable  Post vital signs:  Reviewed and stable  Last Vitals:  Vitals:   12/14/15 1318 12/14/15 1319  BP:    Pulse: 97 83  Resp: (!) 21   Temp:      Complications: No apparent anesthesia complications

## 2015-12-14 NOTE — Progress Notes (Signed)
   12/14/15 1400  Clinical Encounter Type  Visited With Family  Visit Type Initial;Psychological support;Spiritual support  Referral From Physician  Consult/Referral To Chaplain  Spiritual Encounters  Spiritual Needs Emotional;Other (Comment) (Pastoral Conversation/Support)  Stress Factors  Patient Stress Factors Not reviewed  Family Stress Factors Lack of knowledge;Health changes;Major life changes   I  Visited the patient's family in the surgical waiting area. I was answering the Spiritual Care consult that was placed, but the patient had already been taken back to surgery.  The patient's children stated that they were aware of the patient's medical wishes in the event that the patient was unable to make her own.  The patient's sister has some concerns about the patient being on a vent once she is out of surgery, but does not want the other family members to know this.  There seems to be a strained family dynamic between the patient's sister and her friend and the rest of the family.  I will follow-up with the patient once she is able. At this time, we were unable to discuss an Advance Directive.   Please, contact Spiritual Care for further assistance.  Chaplain Clint Bolder M.Div.

## 2015-12-14 NOTE — Progress Notes (Signed)
Received from PACU, opens eyes to name , nods no to pain,  Neo at 18mcg/min, weaned off B/P 108/63 . Nonverbal at this time, family at bedside

## 2015-12-14 NOTE — Progress Notes (Signed)
LCSW continues to follow and assist with disposition. Patient was admitted from [place] with plans to return at [facility]. Medically not stable at this time.  Will continue to follow and assist with discharge needs as they arise.  Deretha Emory, MSW Clinical Social Work: Optician, dispensing Coverage for :  [phone]

## 2015-12-14 NOTE — Progress Notes (Signed)
Nutrition Follow-up  DOCUMENTATION CODES:   Severe malnutrition in context of acute illness/injury, Underweight  INTERVENTION:   Follow-up 12/3 for continued plan of care for nutrition.    NUTRITION DIAGNOSIS:   Inadequate oral intake related to inability to eat as evidenced by NPO status.  GOAL:   Patient will meet greater than or equal to 90% of their needs  MONITOR:   Weight trends, Labs, Skin, I & O's, Other (Comment) (plan concerning nutrition)  ASSESSMENT:   82 y.o. female with medical history significant of  CAD, dementia, DVT, OSA, D, atrial fibrillation on anticoagulation, recurrent small bowel obstruction, CHF nonischemic cardiomyopathy with mitral regurgitation, seizure disorder, recent stroke, As with abdominal pain, vomiting and altered mental status.   Pt scheduled to have surgery today (12/1). Patient NPO since 11/28. TPN d/cd 11/26. NG tube in place. Mg 1.6(L)-11/30, P-5.3(L) 11/28. Consider restarting TPN post op. Monitor for refeeding.    Medications reviewed and include: Cleocin, gentamicin, keppra, protonix, sorbitol, dextrose w/ KCl, magic mouthwash, zofran  Labs reviewed: Na 134(L), creat 1.24(H), Ca 8.4(L), Mg 1.6(L)-11/30, Alb 2.9(L) 11/29 Hgb 7.8(L), Hct 22.2(L) BG- 250 today, AIC 5.9(H) 11/20   Diet Order:  Diet NPO time specified  Skin:  Wound (see comment) (Unstageable, full thickness pressure injury to sacrum)  Last BM:  11/24  Height:   Ht Readings from Last 1 Encounters:  12/01/15 5\' 9"  (1.753 m)    Weight:   Wt Readings from Last 1 Encounters:  12/14/15 135 lb 5.8 oz (61.4 kg)    Ideal Body Weight:  65.9 kg  BMI:  Body mass index is 19.99 kg/m.  Estimated Nutritional Needs:   Kcal:  1350-1600  Protein:  70-80g  Fluid:  1.5-1.7L/day  EDUCATION NEEDS:   No education needs identified at this time   Betsey Holiday, RD, LDN

## 2015-12-14 NOTE — Anesthesia Preprocedure Evaluation (Addendum)
Anesthesia Evaluation  Patient identified by MRN, date of birth, ID band Patient awake    Reviewed: Allergy & Precautions, NPO status , Patient's Chart, lab work & pertinent test results  History of Anesthesia Complications Negative for: history of anesthetic complications  Airway Mallampati: II  TM Distance: >3 FB Neck ROM: Full    Dental  (+) Edentulous Upper, Edentulous Lower   Pulmonary sleep apnea , PE   breath sounds clear to auscultation       Cardiovascular hypertension, Pt. on medications + Peripheral Vascular Disease (s/p AAA stent graft) and + DVT  + dysrhythmias Atrial Fibrillation  Rhythm:Irregular Rate:Tachycardia  9/17 ECHO: EF 40-45%, mod-severe MR, mild-mod TR, pulm HTN   Neuro/Psych Seizures -, Well Controlled,  PSYCHIATRIC DISORDERS (dementia) Anxiety Depression TIACVA    GI/Hepatic Neg liver ROS, GERD  ,H/o SBO: has NG tube   Endo/Other  negative endocrine ROS  Renal/GU Renal InsufficiencyRenal disease (creat 1.24)     Musculoskeletal  (+) Arthritis ,   Abdominal   Peds  Hematology  (+) Blood dyscrasia (Hb 7.8), ,   Anesthesia Other Findings   Reproductive/Obstetrics                            Anesthesia Physical Anesthesia Plan  ASA: IV  Anesthesia Plan: General   Post-op Pain Management:    Induction: Intravenous  Airway Management Planned: Oral ETT  Additional Equipment:   Intra-op Plan:   Post-operative Plan: Possible Post-op intubation/ventilation  Informed Consent: I have reviewed the patients History and Physical, chart, labs and discussed the procedure including the risks, benefits and alternatives for the proposed anesthesia with the patient or authorized representative who has indicated his/her understanding and acceptance.   Consent reviewed with POA  Plan Discussed with: CRNA and Surgeon  Anesthesia Plan Comments: (Plan routine monitors,  GETA with transfusion and probable post op ventilation Discussed with pt's daughter, who understands post op intubation even though pt is DNR)        Anesthesia Quick Evaluation

## 2015-12-15 LAB — DIFFERENTIAL
BASOS ABS: 0 10*3/uL (ref 0.0–0.1)
BASOS PCT: 0 %
EOS ABS: 0 10*3/uL (ref 0.0–0.7)
Eosinophils Relative: 0 %
LYMPHS ABS: 1.1 10*3/uL (ref 0.7–4.0)
Lymphocytes Relative: 13 %
MONO ABS: 0.4 10*3/uL (ref 0.1–1.0)
Monocytes Relative: 5 %
NEUTROS ABS: 6.6 10*3/uL (ref 1.7–7.7)
NEUTROS PCT: 82 %

## 2015-12-15 LAB — COMPREHENSIVE METABOLIC PANEL
ALT: 12 U/L — AB (ref 14–54)
ANION GAP: 7 (ref 5–15)
AST: 16 U/L (ref 15–41)
Albumin: 2.6 g/dL — ABNORMAL LOW (ref 3.5–5.0)
Alkaline Phosphatase: 53 U/L (ref 38–126)
BUN: 23 mg/dL — ABNORMAL HIGH (ref 6–20)
CHLORIDE: 106 mmol/L (ref 101–111)
CO2: 21 mmol/L — AB (ref 22–32)
CREATININE: 1.28 mg/dL — AB (ref 0.44–1.00)
Calcium: 8.1 mg/dL — ABNORMAL LOW (ref 8.9–10.3)
GFR calc non Af Amer: 37 mL/min — ABNORMAL LOW (ref >60)
GFR, EST AFRICAN AMERICAN: 43 mL/min — AB (ref >60)
Glucose, Bld: 147 mg/dL — ABNORMAL HIGH (ref 65–99)
POTASSIUM: 4.2 mmol/L (ref 3.5–5.1)
SODIUM: 134 mmol/L — AB (ref 135–145)
Total Bilirubin: 1.4 mg/dL — ABNORMAL HIGH (ref 0.3–1.2)
Total Protein: 5.6 g/dL — ABNORMAL LOW (ref 6.5–8.1)

## 2015-12-15 LAB — CBC
HCT: 32.4 % — ABNORMAL LOW (ref 36.0–46.0)
Hemoglobin: 11.5 g/dL — ABNORMAL LOW (ref 12.0–15.0)
MCH: 29.1 pg (ref 26.0–34.0)
MCHC: 35.5 g/dL (ref 30.0–36.0)
MCV: 82 fL (ref 78.0–100.0)
PLATELETS: 146 10*3/uL — AB (ref 150–400)
RBC: 3.95 MIL/uL (ref 3.87–5.11)
RDW: 14.7 % (ref 11.5–15.5)
WBC: 8.1 10*3/uL (ref 4.0–10.5)

## 2015-12-15 LAB — GLUCOSE, CAPILLARY
GLUCOSE-CAPILLARY: 143 mg/dL — AB (ref 65–99)
Glucose-Capillary: 152 mg/dL — ABNORMAL HIGH (ref 65–99)
Glucose-Capillary: 160 mg/dL — ABNORMAL HIGH (ref 65–99)

## 2015-12-15 LAB — PHOSPHORUS: PHOSPHORUS: 3.5 mg/dL (ref 2.5–4.6)

## 2015-12-15 LAB — MAGNESIUM: MAGNESIUM: 1.5 mg/dL — AB (ref 1.7–2.4)

## 2015-12-15 LAB — PREALBUMIN: Prealbumin: 6.9 mg/dL — ABNORMAL LOW (ref 18–38)

## 2015-12-15 LAB — TRIGLYCERIDES: TRIGLYCERIDES: 95 mg/dL (ref <150)

## 2015-12-15 MED ORDER — FAT EMULSION 20 % IV EMUL
240.0000 mL | INTRAVENOUS | Status: AC
  Administered 2015-12-15: 240 mL via INTRAVENOUS
  Filled 2015-12-15 (×2): qty 250

## 2015-12-15 MED ORDER — MAGNESIUM SULFATE 2 GM/50ML IV SOLN
2.0000 g | Freq: Once | INTRAVENOUS | Status: AC
  Administered 2015-12-15: 2 g via INTRAVENOUS
  Filled 2015-12-15: qty 50

## 2015-12-15 MED ORDER — INSULIN ASPART 100 UNIT/ML ~~LOC~~ SOLN
0.0000 [IU] | Freq: Four times a day (QID) | SUBCUTANEOUS | Status: AC
  Administered 2015-12-16 – 2015-12-18 (×9): 1 [IU] via SUBCUTANEOUS

## 2015-12-15 MED ORDER — TRACE MINERALS CR-CU-MN-SE-ZN 10-1000-500-60 MCG/ML IV SOLN
INTRAVENOUS | Status: AC
  Administered 2015-12-15: 18:00:00 via INTRAVENOUS
  Filled 2015-12-15 (×2): qty 720

## 2015-12-15 MED ORDER — AMIODARONE HCL IN DEXTROSE 360-4.14 MG/200ML-% IV SOLN
30.0000 mg/h | INTRAVENOUS | Status: DC
  Administered 2015-12-15 – 2015-12-19 (×8): 30 mg/h via INTRAVENOUS
  Filled 2015-12-15 (×9): qty 200

## 2015-12-15 NOTE — Progress Notes (Signed)
PROGRESS NOTE                                                                                                                                                                                                             Patient Demographics:    Brandi Heath, is a 82 y.o. female, DOB - 1927/05/21, ZOX:[id number]  Admit date - 11/30/2015   Admitting Physician Therisa Doyne, MD  Outpatient Primary MD for the patient is Merrilee Seashore, MD  LOS - 15  Outpatient Specialists: none   [facility] primary, Hospitalist now consulting for medical issues.  Chief Complaint  Patient presents with  . Emesis       Brief Narrative  82 year old female with history of A. 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  Patient having recurrent small bowel obstructions (4 episodes over the last few weeks). Surgery consulted and patient taken to or on 12/1For laparotomy and lysis of adhesions. Transfer to stepdown unit postop.   Subjective:   Still in rapid A. Fib requiring Cardizem drip. Complains of some hiccups.   Assessment  & Plan :    Principal Problem:   Recurrent SBO (small bowel obstruction) Multiple small bowel obstruction (fourth episode in the past 2 months). Underwent laparotomy with lysis of adhesions on 12/1. Tolerated well. Cardiology consulted for preop clearance and recommended high Cardiac risk for surgery.  Active Problems:   Atrial fibrillation with RVR (HCC) Requiring maximum dose of Cardizem drip.Heart rate still in 120s. Continue heparin drip. Started on low-dose amiodarone drip after discussion with cardiology. They will follow today.  Seizure disorder On IV phenytoin.  Acute kidney injury Secondary to dehydration. Improving with fluids  Hypokalemia/hypomagnesemia Replenish potassium with IV  fluids. Replenished  Severe protein calorie malnutrition Started TPN postop.  Anemia of chronic disease At baseline. Transfuse if <7    Moderate to severe dementia         Code Status : DO NOT RESUSCITATE  Family Communication  : None  Disposition Plan  : Pending hospital course, surgical repair.  Barriers For Discharge : Active symptoms  Consults  :   [facility]  Procedures  :  Laparotomy with lysis of adhesions on 12/1  DVT Prophylaxis  : IV heparin  Lab  Results  Component Value Date   PLT 146 (L) 12/15/2015    Antibiotics  :    Anti-infectives    Start     Dose/Rate Route Frequency Ordered Stop   12/14/15 0600  clindamycin (CLEOCIN) IVPB 900 mg  Status:  Discontinued     900 mg 100 mL/hr over 30 Minutes Intravenous On call to O.R. 12/13/15 1305 12/14/15 1618   12/14/15 0600  gentamicin (GARAMYCIN) 310 mg in dextrose 5 % 100 mL IVPB  Status:  Discontinued     5 mg/kg  61.4 kg 107.8 mL/hr over 60 Minutes Intravenous On call to O.R. 12/13/15 1305 12/14/15 1618   12/03/15 0830  fluconazole (DIFLUCAN) tablet 150 mg     150 mg Oral  Once 12/03/15 0828 12/03/15 0859   12/02/15 2200  levofloxacin (LEVAQUIN) IVPB 500 mg  Status:  Discontinued     500 mg 100 mL/hr over 60 Minutes Intravenous Every 48 hours 11/30/15 2202 12/03/15 1036   11/30/15 2230  aztreonam (AZACTAM) 500 mg in dextrose 5 % 50 mL IVPB  Status:  Discontinued     500 mg 100 mL/hr over 30 Minutes Intravenous Every 8 hours 11/30/15 2203 12/01/15 0732   11/30/15 2145  levofloxacin (LEVAQUIN) IVPB 750 mg     750 mg 100 mL/hr over 90 Minutes Intravenous  Once 11/30/15 2130 11/30/15 2327   11/30/15 2145  aztreonam (AZACTAM) 2 g in dextrose 5 % 50 mL IVPB  Status:  Discontinued     2 g 100 mL/hr over 30 Minutes Intravenous  Once 11/30/15 2130 11/30/15 2203        Objective:   Vitals:   12/15/15 0500 12/15/15 0600 12/15/15 0700 12/15/15 0800  BP:   101/70 90/71  Pulse: 69   (!) 116 (!) 141  Resp: 19 20 17 15   Temp:    97.8 F (36.6 C)  TempSrc:    Oral  SpO2: 100% 100% 100% 100%  Weight: 59 kg (130 lb 1.1 oz)     Height:        Wt Readings from Last 3 Encounters:  12/15/15 59 kg (130 lb 1.1 oz)  11/14/15 66.2 kg (146 lb)  11/14/15 72.6 kg (160 lb)     Intake/Output Summary (Last 24 hours) at 12/15/15 1150 Last data filed at 12/15/15 0800  Gross per 24 hour  Intake          3300.25 ml  Output              630 ml  Net          2670.25 ml     Physical Exam  Gen: Fatigue, HEENT: NG Clamped. Dry mucosa, supple neck Chest: clear b/l, no added sounds CVS: S1 and S2 irregular, no murmurs GI: Midline laparotomy site clean, absent bowel sounds, Musculoskeletal: warm, no edema     Data Review:    CBC  Recent Labs Lab 12/08/15 1657  12/10/15 1812 12/12/15 0123 12/13/15 0410 12/14/15 0429 12/14/15 1531 12/15/15 0359  WBC 7.2  < > 7.7 7.0 6.9 5.8  --  8.1  HGB 9.1*  < > 9.8* 8.6* 8.3* 7.8* 11.9* 11.5*  HCT 26.4*  < > 27.8* 24.4* 23.8* 22.2* 35.0* 32.4*  PLT 204  < > 204 205 207 197  --  146*  MCV 82.2  < > 80.3 81.6 81.8 81.6  --  82.0  MCH 28.3  < > 28.3 28.8 28.5 28.7  --  29.1  MCHC 34.5  < >  35.3 35.2 34.9 35.1  --  35.5  RDW 14.5  < > 14.6 14.9 14.9 14.8  --  14.7  LYMPHSABS 1.2  --  1.0  --   --   --   --  1.1  MONOABS 0.7  --  0.8  --   --   --   --  0.4  EOSABS 0.1  --  0.1  --   --   --   --  0.0  BASOSABS 0.0  --  0.0  --   --   --   --  0.0  < > = values in this interval not displayed.  Chemistries   Recent Labs Lab 12/09/15 0527 12/10/15 0505 12/11/15 0336 12/12/15 0123 12/13/15 1024 12/14/15 0429 12/14/15 1531 12/15/15 0359  NA 136 134* 137 139 141 134* 139 134*  K 5.6* 4.8 4.0 3.6 2.8* 4.4 3.6 4.2  CL 112* 107 105 105 108 102  --  106  CO2 18* 19* 23 22 23 24   --  21*  GLUCOSE 109* 89 107* 86 75 250*  --  147*  BUN 14 15 22* 26* 25* 18  --  23*  CREATININE 1.01* 1.27* 1.65* 1.93* 1.50* 1.24*  --  1.28*    CALCIUM 8.7* 9.4 9.9 8.8* 8.8* 8.4*  --  8.1*  MG 1.5*  --  1.9  --  1.6*  --   --  1.5*  AST  --  21 18 17   --   --   --  16  ALT  --  14 15 13*  --   --   --  12*  ALKPHOS  --  64 76 57  --   --   --  53  BILITOT  --  0.9 1.3* 1.1  --   --   --  1.4*   ------------------------------------------------------------------------------------------------------------------  Recent Labs  12/15/15 0359  TRIG 95    Lab Results  Component Value Date   HGBA1C 5.9 (H) 12/03/2015   ------------------------------------------------------------------------------------------------------------------ No results for input(s): TSH, T4TOTAL, T3FREE, THYROIDAB in the last 72 hours.  Invalid input(s): FREET3 ------------------------------------------------------------------------------------------------------------------ No results for input(s): VITAMINB12, FOLATE, FERRITIN, TIBC, IRON, RETICCTPCT in the last 72 hours.  Coagulation profile No results for input(s): INR, PROTIME in the last 168 hours.  No results for input(s): DDIMER in the last 72 hours.  Cardiac Enzymes No results for input(s): CKMB, TROPONINI, MYOGLOBIN in the last 168 hours.  Invalid input(s): CK ------------------------------------------------------------------------------------------------------------------ No results found for: BNP  Inpatient Medications  Scheduled Meds: . chlorhexidine  15 mL Mouth Rinse BID  . insulin aspart  0-9 Units Subcutaneous Q6H  . latanoprost  1 drop Both Eyes QHS  . levETIRAcetam  750 mg Intravenous Q12H  . lip balm  1 application Topical BID  . mouth rinse  15 mL Mouth Rinse q12n4p  . MUSCLE RUB   Topical QHS  . pantoprazole (PROTONIX) IV  40 mg Intravenous Q12H  . sodium chloride flush  10-40 mL Intracatheter Q12H  . sodium chloride flush  3 mL Intravenous Q12H   Continuous Infusions: . amiodarone    . dextrose 5 % 1,000 mL with potassium chloride 40 mEq infusion 50 mL/hr at 12/15/15  0800  . diltiazem (CARDIZEM) infusion 15 mg/hr (12/15/15 0853)  . TPN (CLINIMIX) Adult without lytes     And  . fat emulsion     PRN Meds:.magic mouthwash, menthol-cetylpyridinium, morphine injection, [DISCONTINUED] ondansetron **OR** ondansetron (ZOFRAN) IV, phenol, sodium chloride  flush  Micro Results No results found for this or any previous visit (from the past 240 hour(s)).  Radiology Reports Ct Abdomen Pelvis Wo Contrast  Result Date: 11/30/2015 CLINICAL DATA:  Vomiting. Generalized abdominal pain and distention. EXAM: CT ABDOMEN AND PELVIS WITHOUT CONTRAST TECHNIQUE: Multidetector CT imaging of the abdomen and pelvis was performed following the standard protocol without IV contrast. COMPARISON:  10/01/2015 FINDINGS: Lower chest: There is cardiomegaly. Chronic opacities, likely scarring in the lung bases. No effusions. Densely calcified coronary arteries noted. There is a moderate-sized hiatal hernia which is fluid-filled. Hepatobiliary: Prior cholecystectomy.  No focal hepatic abnormality. Pancreas: Diffuse atrophy of the pancreas without visualized focal abnormality or ductal dilatation. Spleen: No focal abnormality.  Normal size. Adrenals/Urinary Tract: Small cyst in the midpole of the right kidney. No hydronephrosis. Adrenal gland is unremarkable. Urinary bladder decompressed. Stomach/Bowel: The stomach is markedly distended with fluid. There are dilated small bowel loops in the right abdomen and extending into the pelvis. Transition point is in the mid pelvis on image 64, presumably related to adhesions. Findings compatible with distal small bowel obstruction. Colon is decompressed, grossly unremarkable. Vascular/Lymphatic: Prior stent graft repair of abdominal aortic aneurysm. The aneurysm sac size is 5.1 cm compared with 5.8 cm previously. No adenopathy. Reproductive: Prior hysterectomy.  No adnexal masses. Other: No free fluid or free air. Musculoskeletal: No acute bony abnormality or  focal bone lesion. Diffuse degenerative disc and facet disease in the lumbar spine. IMPRESSION: Distal small bowel obstruction, likely related to adhesions. Moderate-sized fluid-filled hiatal hernia. Coronary artery disease, aortic atherosclerosis. Prior stent graft repair of AAA. Decreasing aneurysm sac size since prior study. Electronically Signed   By: Charlett NoseKevin  Dover M.D.   On: 11/30/2015 20:25   Dg Abd 1 View  Result Date: 12/11/2015 CLINICAL DATA:  Abdominal pain and distention for several days EXAM: ABDOMEN - 1 VIEW COMPARISON:  11/17/2015 FINDINGS: There again noted changes consistent with aortic stent graft placement. The overall appearance is stable. IVC filter is noted as well but somewhat obscured by the stent graft. Left external iliac artery stent is noted as well. Scattered large and small bowel gas is noted. Dilated small bowel is noted in the mid abdomen on the right. There is a relative paucity of bowel gas on the left which may be related to distention of the stomach. IMPRESSION: Changes consistent with at least partial small bowel obstruction. Electronically Signed   By: Alcide CleverMark  Lukens M.D.   On: 12/11/2015 12:00   Dg Abd 1 View  Result Date: 12/01/2015 CLINICAL DATA:  Small bowel obstruction EXAM: ABDOMEN - 1 VIEW COMPARISON:  11/30/2015 FINDINGS: Dilated small bowel loops again noted right abdomen and pelvis compatible with small bowel obstruction, unchanged. NG tube remains in the stomach. Stent graft again noted. IVC filter noted. No free air or visible organomegaly. IMPRESSION: Continued small bowel dilatation compatible with small bowel obstruction. No real change. Electronically Signed   By: Charlett NoseKevin  Dover M.D.   On: 12/01/2015 07:09   Dg Chest Port 1 View  Result Date: 12/02/2015 CLINICAL DATA:  Post PICC line insertion EXAM: PORTABLE CHEST 1 VIEW COMPARISON:  November 14, 2015 FINDINGS: The heart, hila, and mediastinum are unchanged. The right PICC line terminates near the  cavoatrial junction. The distal tip of the NG tube is not assessed. There is tubing coiled back on itself projected over the midline of the upper chest, likely the portion of the NG tube outside dictation. High attenuation material in the lung bases  is stable, possibly aspirated material. No other interval changes or acute abnormalities. IMPRESSION: 1. The right PICC line terminates near the caval atrial junction. Electronically Signed   By: Gerome Sam III M.D   On: 12/02/2015 18:49   Dg Abd 2 Views  Result Date: 12/07/2015 CLINICAL DATA:  Small bowel obstruction. EXAM: ABDOMEN - 2 VIEW COMPARISON:  Single-view of the abdomen 12/02/2015 and 12/06/2015. FINDINGS: NG tube remains in place. Mild gaseous distention of a loop of distal small bowel at 3.2 cm is unchanged. No free intraperitoneal air is identified. Cholecystectomy clips, aneurysm coils and aortoiliac stent graft early again seen. IMPRESSION: No marked change in mild gas distention of small bowel since the most recent examination compatible with small-bowel obstruction. Electronically Signed   By: Drusilla Kanner M.D.   On: 12/07/2015 10:12   Dg Abd Portable 1v  Result Date: 12/06/2015 CLINICAL DATA:  Follow up partial small bowel obstruction. EXAM: PORTABLE ABDOMEN - 1 VIEW COMPARISON:  12/02/2015 dating back to 10/01/2015, including CT abdomen and pelvis 11/30/2015 and 10/01/2015. FINDINGS: Nasogastric tube tip projects over the expected location of the midbody of a J-shaped stomach, unchanged. Several dilated loops of small bowel persist in the abdomen and pelvis, though these loops are less distended than on the examination 4 days ago. Gas and stool is present in the decompressed colon. No suggestion of free air on the supine image. Prior aorto bi-iliac stent grafting for the previously identified abdominal aortic aneurysm. IVC filter in the lower IVC. Prior embolization of the right internal iliac artery. IMPRESSION: Persistent partial  small bowel obstruction which is minimally improved since examination 4 days ago. Electronically Signed   By: Hulan Saas M.D.   On: 12/06/2015 10:56   Dg Abd Portable 1v  Result Date: 12/02/2015 CLINICAL DATA:  Small bowel obstruction. EXAM: PORTABLE ABDOMEN - 1 VIEW COMPARISON:  December 01, 2015 FINDINGS: A small bowel obstruction persists. No contrast is identified on today's study. However, only minimal contrast is seen in stomach previously. NG tube terminates in the left upper quadrant. IMPRESSION: Persistent small bowel obstruction. Electronically Signed   By: Gerome Sam III M.D   On: 12/02/2015 16:01   Dg Abd Portable 1v-small Bowel Obstruction Protocol-initial, 8 Hr Delay  Result Date: 12/01/2015 CLINICAL DATA:  Small bowel obstruction. EXAM: PORTABLE ABDOMEN - 1 VIEW COMPARISON:  December 01, 2015 FINDINGS: The NG tube is stable. A small amount of contrast is seen in the stomach, new in the interval. No other contrast seen throughout the abdomen. Persistently dilated small bowel loops in the right abdomen consistent with small bowel obstruction. No other change. IMPRESSION: Small bowel obstruction. Electronically Signed   By: Gerome Sam III M.D   On: 12/01/2015 22:49   Dg Abd Portable 1v-small Bowel Protocol-position Verification  Result Date: 12/01/2015 CLINICAL DATA:  Followup small bowel obstruction. Nasogastric tube placement. EXAM: PORTABLE ABDOMEN - 1 VIEW COMPARISON:  12/01/2015 FINDINGS: Nasogastric tube is seen with tip in the body the stomach. Moderately dilated small bowel loops in the right abdomen show no significant change compared to recent study. Aortoiliac stent graft again seen as well as embolization coils adjacent to the right iliac limb of the graft. IVC filter again noted. IMPRESSION: Nasogastric tube tip remains in the body the stomach. No significant change in dilated small bowel loops, consistent with small bowel obstruction. Electronically Signed    By: Myles Rosenthal M.D.   On: 12/01/2015 12:49   Dg Abd Portable 1v  Result  Date: 11/30/2015 CLINICAL DATA:  Nasogastric tube placement EXAM: PORTABLE ABDOMEN - 1 VIEW COMPARISON:  11/30/2015 CT FINDINGS: The nasogastric tube extends well into the stomach with tip in the region of the distal gastric body. IMPRESSION: Nasogastric tube extends into the stomach. Electronically Signed   By: Ellery Plunk M.D.   On: 11/30/2015 22:55    Time Spent in minutes  35   Eddie North M.D on 12/15/2015 at 11:50 AM  Between 7am to 7pm - Pager - [phone]  After 7pm go to [email or web address] - password [facility]  [facility] -  Office  [phone]

## 2015-12-15 NOTE — Progress Notes (Signed)
Weekend Chaplain referred to the room by Weekday chaplain to determine spiritual needs and need for HCPOA. Brandi Heath was asleep when the chaplain visited. Unit requested to page the chaplain should she need spiritual care. Staff reports Brandi Heath suffers from dementia and is not a good candidate to complete a HCPOA.  Page chaplain if Brandi Heath requires or wishes spiritual care.  Benjie Karvonen. Philipp Callegari, DMin, MDiv Weekend [address]

## 2015-12-15 NOTE — Progress Notes (Signed)
PHARMACY - ADULT TOTAL PARENTERAL NUTRITION CONSULT NOTE   Pharmacy Consult for TPN Indication:recurrent SBO, prolonged ileus  Patient Measurements: Height: 5\' 9"  (175.3 cm) Weight: 130 lb 1.1 oz (59 kg) IBW/kg (Calculated) : 66.2 TPN AdjBW (KG): 54.2 Body mass index is 19.21 kg/m. Usual Weight:   Insulin Requirements: not currently on insulin  Current Nutrition: NPO  IVF: D5 with 40 mEq KCl at 50 ml/hr  Central access: PICC TPN start date: 12/2/  ASSESSMENT                                                                                                          HPI: 82 y.o.femalewith medical history significant of CAD, dementia, DVT/PE, OSA, AFib on anticoagulation, recurrent SBO, CHF, NICM with mitral regurgitation, seizure disorder, recent stroke; presented with N/V, abd pain, and AMS. Recently admitted for SBO in October which improved with conservative mgmt. She was started on TPN on 12/03/15 and d/ced on 11/26.  Patient had limited oral intake since TPN was discontinued. S/p exp lap with lysis of adhesion and I&D of sacral wound on 12/01.  To start TPN back post-op on 12/2.  Significant events:  11/24: TPN stopped on 12/23 after family meeting with palliative care, however resume today/ per discussion with Dr Mahala Menghini.   11/25: Diet started per surgery 11/26: Surgery recommending to advance to full liquids for dinner if tolerates clears through lunch. TPN d/ced 12/01: s/p exp lap with lysis of adhesion and I&D of sacral wound 12/02: resume TPN   Today:   Glucose (goal <150): wnl; no hx DM  Electrolytes: Na slightly low at 134, K+, Phos, CorrCa and iCa wnl, Mag low at 1.5  Renal: scr 1.28 (crcl~29)  LFTs: AST/ALT wnl, Tbili silghtly elevated at 1.4  TGs: wnl (11/21)  Prealbumin: 17 (11/21)  NUTRITIONAL GOALS                                                                                             RD recs (12/01): Kcal:  1350-1600 Protein:  70-80g Fluid:   1.5-1.7L/day  Clinimix 5/15 (will use electrolyte free clinimix for now d/t critical Clinimix shortage and availability of products in the pharmacy) at a goal rate of 65 ml/hr + 20% fat emulsion at 86ml/hr to provide: 78 g/day protein, 1588 Kcal/day.   PLAN  Now:  - Magnesium 2 gm IV q24h   At 1800 today:  Start Clinimix E 5/15 at 30 ml/hr.  -  Will monitor electrolytes closely while on electrolyte free clinimix and replace if needed - once rate is increased to where patient requires > 1L bag of clinimix, will change over to 2L bag of clinimix with electrolytes  20% fat emulsion at 10 ml/hr.  Plan to advance as tolerated to the goal rate.  TPN to contain standard multivitamins and trace elements.  D/c IV fluid  Add sensitive SSI q6h.   TPN lab panels on Mondays & Thursdays.  Cmet, mag, phos on 12/3  F/u daily.  +++ Clinimix is in critical shortage and pharmacy has very limited supply in stock.  If Appropriate, consider switching patient off TPN as soon as possible+++   Sharryn Belding P 12/15/2015,7:21 AM

## 2015-12-15 NOTE — Progress Notes (Signed)
Subjective: No CP  Abdomen feels tight  Breathing is OK Objective: Vitals:   12/15/15 0500 12/15/15 0600 12/15/15 0700 12/15/15 0800  BP:   101/70   Pulse: 69  (!) 116   Resp: 19 20 17    Temp:    97.8 F (36.6 C)  TempSrc:    Oral  SpO2: 100% 100% 100%   Weight: 130 lb 1.1 oz (59 kg)     Height:       Weight change: -5 lb 4.7 oz (-2.4 kg)  Intake/Output Summary (Last 24 hours) at 12/15/15 0924 Last data filed at 12/15/15 0600  Gross per 24 hour  Intake          3200.25 ml  Output              580 ml  Net          2620.25 ml   I/O 4.1 L negative   General: Alert, awake, oriented x3, in no acute distress Neck:  JVP is normal Heart: Regular rate and rhythm, without murmurs, rubs, gallops.  Lungs: Clear to auscultation.  No rales or wheezes. Exemities:  No edema.   Neuro: Grossly intact, nonfocal.  Tele  Afib  90s to 110  Lab Results: Results for orders placed or performed during the hospital encounter of 11/30/15 (from the past 24 hour(s))  Type and screen     Status: None (Preliminary result)   Collection Time: 12/14/15 12:41 PM  Result Value Ref Range   ABO/RH(D) O POS    Antibody Screen NEG    Sample Expiration 12/17/2015    Unit Number [id number]    Blood Component Type RBC LR PHER1    Unit division 00    Status of Unit ISSUED    Transfusion Status OK TO TRANSFUSE    Crossmatch Result Compatible    Unit Number [id number]    Blood Component Type RBC LR PHER2    Unit division 00    Status of Unit ISSUED    Transfusion Status OK TO TRANSFUSE    Crossmatch Result Compatible    Unit Number [id number]    Blood Component Type RED CELLS,LR    Unit division 00    Status of Unit ALLOCATED    Transfusion Status OK TO TRANSFUSE    Crossmatch Result Compatible    Unit Number [id number]    Blood Component Type RED CELLS,LR    Unit division 00    Status of Unit ALLOCATED    Transfusion Status OK TO TRANSFUSE    Crossmatch Result Compatible     Prepare RBC     Status: None   Collection Time: 12/14/15 12:49 PM  Result Value Ref Range   Order Confirmation ORDER PROCESSED BY BLOOD BANK   Prepare RBC     Status: None   Collection Time: 12/14/15  1:30 PM  Result Value Ref Range   Order Confirmation ORDER PROCESSED BY BLOOD BANK   I-STAT 7, (LYTES, BLD GAS, ICA, H+H)     Status: Abnormal   Collection Time: 12/14/15  3:31 PM  Result Value Ref Range   pH, Arterial 7.355 7.350 - 7.450   pCO2 arterial 40.0 32.0 - 48.0 mmHg   pO2, Arterial 400.0 (H) 83.0 - 108.0 mmHg   Bicarbonate 22.3 20.0 - 28.0 mmol/L   TCO2 24 0 - 100 mmol/L   O2 Saturation 100.0 %   Acid-base deficit 3.0 (H) 0.0 - 2.0 mmol/L   Sodium 139 135 - 145 mmol/L  Potassium 3.6 3.5 - 5.1 mmol/L   Calcium, Ion 1.20 1.15 - 1.40 mmol/L   HCT 35.0 (L) 36.0 - 46.0 %   Hemoglobin 11.9 (L) 12.0 - 15.0 g/dL   Patient temperature HIDE    Sample type ARTERIAL   Glucose, capillary     Status: Abnormal   Collection Time: 12/14/15 11:10 PM  Result Value Ref Range   Glucose-Capillary 139 (H) 65 - 99 mg/dL   Comment 1 Notify RN    Comment 2 Document in Chart   CBC     Status: Abnormal   Collection Time: 12/15/15  3:59 AM  Result Value Ref Range   WBC 8.1 4.0 - 10.5 K/uL   RBC 3.95 3.87 - 5.11 MIL/uL   Hemoglobin 11.5 (L) 12.0 - 15.0 g/dL   HCT 02.2 (L) 33.6 - 12.2 %   MCV 82.0 78.0 - 100.0 fL   MCH 29.1 26.0 - 34.0 pg   MCHC 35.5 30.0 - 36.0 g/dL   RDW 44.9 75.3 - 00.5 %   Platelets 146 (L) 150 - 400 K/uL  Comprehensive metabolic panel     Status: Abnormal   Collection Time: 12/15/15  3:59 AM  Result Value Ref Range   Sodium 134 (L) 135 - 145 mmol/L   Potassium 4.2 3.5 - 5.1 mmol/L   Chloride 106 101 - 111 mmol/L   CO2 21 (L) 22 - 32 mmol/L   Glucose, Bld 147 (H) 65 - 99 mg/dL   BUN 23 (H) 6 - 20 mg/dL   Creatinine, Ser 1.10 (H) 0.44 - 1.00 mg/dL   Calcium 8.1 (L) 8.9 - 10.3 mg/dL   Total Protein 5.6 (L) 6.5 - 8.1 g/dL   Albumin 2.6 (L) 3.5 - 5.0 g/dL   AST 16  15 - 41 U/L   ALT 12 (L) 14 - 54 U/L   Alkaline Phosphatase 53 38 - 126 U/L   Total Bilirubin 1.4 (H) 0.3 - 1.2 mg/dL   GFR calc non Af Amer 37 (L) >60 mL/min   GFR calc Af Amer 43 (L) >60 mL/min   Anion gap 7 5 - 15  Magnesium     Status: Abnormal   Collection Time: 12/15/15  3:59 AM  Result Value Ref Range   Magnesium 1.5 (L) 1.7 - 2.4 mg/dL  Phosphorus     Status: None   Collection Time: 12/15/15  3:59 AM  Result Value Ref Range   Phosphorus 3.5 2.5 - 4.6 mg/dL  Differential     Status: None   Collection Time: 12/15/15  3:59 AM  Result Value Ref Range   Neutrophils Relative % 82 %   Lymphocytes Relative 13 %   Monocytes Relative 5 %   Eosinophils Relative 0 %   Basophils Relative 0 %   Neutro Abs 6.6 1.7 - 7.7 K/uL   Lymphs Abs 1.1 0.7 - 4.0 K/uL   Monocytes Absolute 0.4 0.1 - 1.0 K/uL   Eosinophils Absolute 0.0 0.0 - 0.7 K/uL   Basophils Absolute 0.0 0.0 - 0.1 K/uL   RBC Morphology TARGET CELLS    Smear Review LARGE PLATELETS PRESENT   Glucose, capillary     Status: Abnormal   Collection Time: 12/15/15  7:42 AM  Result Value Ref Range   Glucose-Capillary 152 (H) 65 - 99 mg/dL   Comment 1 Notify RN    Comment 2 Document in Chart     Studies/Results: No results found.  Medications: Reviewed   @PROBHOSP @  1  Nonischemic  cardiomyopathy/ chronic systolic CHF  Volume status is not bad   Follow I/P    2  Chronic atrial fib  Rates mildly increased  Continue IV amiodarone    3  Mod to severe MR    LOS: 15 days   Dietrich PatesPaula Tarance Balan 12/15/2015, 9:24 AM

## 2015-12-15 NOTE — Progress Notes (Signed)
Patient ID: Brandi Heath, female   DOB: 10/08/1927, 82 y.o.   MRN: [id number]  General Surgery [facility]  Assessment & Plan:  Small bowel obstruction, status post ex lap with LOA POD#1  NG decompression, NPO, IV hydration  Await resolution of ileus Sacral decubitus  Debrided in OR yesterday  Will begin dressing changes tomorrow 12/3        Velora Heckler, MD, [facility]       Office: [phone]    Subjective: Patient in ICU, awakens to voice, wants water to drink  Objective: Vital signs in last 24 hours: Temp:  [97.6 F (36.4 C)-98.9 F (37.2 C)] 98.9 F (37.2 C) (12/02 0400) Pulse Rate:  [29-135] 116 (12/02 0700) Resp:  [9-28] 17 (12/02 0700) BP: (93-117)/(58-83) 101/70 (12/02 0700) SpO2:  [89 %-100 %] 100 % (12/02 0700) Arterial Line BP: (97-117)/(58-68) 97/58 (12/01 2232) Weight:  [59 kg (130 lb 1.1 oz)] 59 kg (130 lb 1.1 oz) (12/02 0500) Last BM Date: 12/13/15  Intake/Output from previous day: 12/01 0701 - 12/02 0700 In: 3200.3 [I.V.:2434.8; Blood:658; IV Piggyback:107.5] Out: 880 [Urine:550; Emesis/NG output:180; Blood:150] Intake/Output this shift: No intake/output data recorded.  Physical Exam: HEENT - sclerae clear, mucous membranes moist, NG in nares Neck - soft Chest - clear bilaterally Cor - irreg with rate 100 Abdomen - soft without distension; midline wound intact with small serosanguinous in dressing; quiet  Lab Results:   Recent Labs  12/14/15 0429 12/14/15 1531 12/15/15 0359  WBC 5.8  --  8.1  HGB 7.8* 11.9* 11.5*  HCT 22.2* 35.0* 32.4*  PLT 197  --  146*   BMET  Recent Labs  12/14/15 0429 12/14/15 1531 12/15/15 0359  NA 134* 139 134*  K 4.4 3.6 4.2  CL 102  --  106  CO2 24  --  21*  GLUCOSE 250*  --  147*  BUN 18  --  23*  CREATININE 1.24*  --  1.28*  CALCIUM 8.4*  --  8.1*   PT/INR No results for input(s): LABPROT, INR in the last 72 hours. Comprehensive Metabolic  Panel:    Component Value Date/Time   NA 134 (L) 12/15/2015 0359   NA 139 12/14/2015 1531   NA 142 10/30/2015   NA 142 10/29/2015   K 4.2 12/15/2015 0359   K 3.6 12/14/2015 1531   CL 106 12/15/2015 0359   CL 102 12/14/2015 0429   CO2 21 (L) 12/15/2015 0359   CO2 24 12/14/2015 0429   BUN 23 (H) 12/15/2015 0359   BUN 18 12/14/2015 0429   BUN 9 10/30/2015   BUN 14 10/29/2015   CREATININE 1.28 (H) 12/15/2015 0359   CREATININE 1.24 (H) 12/14/2015 0429   GLUCOSE 147 (H) 12/15/2015 0359   GLUCOSE 250 (H) 12/14/2015 0429   CALCIUM 8.1 (L) 12/15/2015 0359   CALCIUM 8.4 (L) 12/14/2015 0429   AST 16 12/15/2015 0359   AST 17 12/12/2015 0123   ALT 12 (L) 12/15/2015 0359   ALT 13 (L) 12/12/2015 0123   ALKPHOS 53 12/15/2015 0359   ALKPHOS 57 12/12/2015 0123   BILITOT 1.4 (H) 12/15/2015 0359   BILITOT 1.1 12/12/2015 0123   PROT 5.6 (L) 12/15/2015 0359   PROT 6.2 (L) 12/12/2015 0123   ALBUMIN 2.6 (L) 12/15/2015 0359   ALBUMIN 2.9 (L) 12/12/2015 0123    Studies/Results: No results found.    Brandi Heath Judie Petit 12/15/2015

## 2015-12-16 LAB — GLUCOSE, CAPILLARY
GLUCOSE-CAPILLARY: 126 mg/dL — AB (ref 65–99)
GLUCOSE-CAPILLARY: 128 mg/dL — AB (ref 65–99)
GLUCOSE-CAPILLARY: 129 mg/dL — AB (ref 65–99)
Glucose-Capillary: 141 mg/dL — ABNORMAL HIGH (ref 65–99)

## 2015-12-16 LAB — COMPREHENSIVE METABOLIC PANEL
ALT: 11 U/L — AB (ref 14–54)
ANION GAP: 7 (ref 5–15)
AST: 13 U/L — ABNORMAL LOW (ref 15–41)
Albumin: 2.4 g/dL — ABNORMAL LOW (ref 3.5–5.0)
Alkaline Phosphatase: 54 U/L (ref 38–126)
BUN: 22 mg/dL — ABNORMAL HIGH (ref 6–20)
CHLORIDE: 105 mmol/L (ref 101–111)
CO2: 20 mmol/L — AB (ref 22–32)
CREATININE: 1.21 mg/dL — AB (ref 0.44–1.00)
Calcium: 8.3 mg/dL — ABNORMAL LOW (ref 8.9–10.3)
GFR, EST AFRICAN AMERICAN: 46 mL/min — AB (ref >60)
GFR, EST NON AFRICAN AMERICAN: 39 mL/min — AB (ref >60)
Glucose, Bld: 140 mg/dL — ABNORMAL HIGH (ref 65–99)
Potassium: 3.9 mmol/L (ref 3.5–5.1)
SODIUM: 132 mmol/L — AB (ref 135–145)
Total Bilirubin: 0.6 mg/dL (ref 0.3–1.2)
Total Protein: 5.5 g/dL — ABNORMAL LOW (ref 6.5–8.1)

## 2015-12-16 LAB — HEPARIN LEVEL (UNFRACTIONATED)

## 2015-12-16 LAB — CBC
HCT: 27.2 % — ABNORMAL LOW (ref 36.0–46.0)
Hemoglobin: 9.7 g/dL — ABNORMAL LOW (ref 12.0–15.0)
MCH: 29.3 pg (ref 26.0–34.0)
MCHC: 35.7 g/dL (ref 30.0–36.0)
MCV: 82.2 fL (ref 78.0–100.0)
PLATELETS: 139 10*3/uL — AB (ref 150–400)
RBC: 3.31 MIL/uL — ABNORMAL LOW (ref 3.87–5.11)
RDW: 15.3 % (ref 11.5–15.5)
WBC: 7.8 10*3/uL (ref 4.0–10.5)

## 2015-12-16 LAB — MAGNESIUM: MAGNESIUM: 1.9 mg/dL (ref 1.7–2.4)

## 2015-12-16 LAB — PHOSPHORUS: PHOSPHORUS: 3.2 mg/dL (ref 2.5–4.6)

## 2015-12-16 MED ORDER — MAGNESIUM SULFATE IN D5W 1-5 GM/100ML-% IV SOLN
1.0000 g | Freq: Once | INTRAVENOUS | Status: AC
Start: 2015-12-16 — End: 2015-12-16
  Administered 2015-12-16: 1 g via INTRAVENOUS
  Filled 2015-12-16: qty 100

## 2015-12-16 MED ORDER — HEPARIN (PORCINE) IN NACL 100-0.45 UNIT/ML-% IJ SOLN
1150.0000 [IU]/h | INTRAMUSCULAR | Status: DC
  Administered 2015-12-16: 900 [IU]/h via INTRAVENOUS
  Administered 2015-12-17: 1150 [IU]/h via INTRAVENOUS
  Filled 2015-12-16 (×2): qty 250

## 2015-12-16 MED ORDER — M.V.I. ADULT IV INJ
INJECTION | INTRAVENOUS | Status: AC
  Administered 2015-12-16: 17:00:00 via INTRAVENOUS
  Filled 2015-12-16 (×2): qty 1200

## 2015-12-16 MED ORDER — FAT EMULSION 20 % IV EMUL
240.0000 mL | INTRAVENOUS | Status: AC
  Administered 2015-12-16: 240 mL via INTRAVENOUS
  Filled 2015-12-16 (×2): qty 250

## 2015-12-16 NOTE — Progress Notes (Signed)
Nutrition Follow-up  DOCUMENTATION CODES:   Severe malnutrition in context of acute illness/injury, Underweight  INTERVENTION:   Monitor magnesium, potassium, and phosphorus daily for at least 3 days, MD to replete as needed, as pt is at risk for refeeding syndrome given severe malnutrition and continued poor PO intake prior to surgery.  TPN per Pharmacy RD to continue to monitor  NUTRITION DIAGNOSIS:   Inadequate oral intake related to inability to eat as evidenced by NPO status.  Ongoing.  GOAL:   Patient will meet greater than or equal to 90% of their needs  Progressing with TPN.  MONITOR:   Labs, Weight trends, Skin, I & O's, Other (Comment) (TPN )  REASON FOR ASSESSMENT:   Consult New TPN/TNA  ASSESSMENT:   82 y.o. female with medical history significant of  CAD, dementia, DVT, OSA, D, atrial fibrillation on anticoagulation, recurrent small bowel obstruction, CHF nonischemic cardiomyopathy with mitral regurgitation, seizure disorder, recent stroke, As with abdominal pain, vomiting and altered mental status. 12/1: s/p EXPLORATORY LAPAROTOMY POSSIBLE SMALL BOWEL RESECTION WITH COLOSTOMY DEBRIDMENT OF SACRAL DECUBITUS ULCER, skin subcutaneous tissue and fascia 6 x 6 cm  Patient receiving Clinimix 5/15 @ 30 ml/hr and ILE @ 10 ml/hr, providing 991 kcal and 36 g protein.  Per Pharmacy note, pt will switch to electrolyte Clinimix today.  Medications: IV Zofran PRN Labs reviewed: CBGs: 126-128 Low Na Mg/Phos WNL  Plan per Pharmacy 12/3: Now:  - Magnesium 1 gm IV x1  At 1800 today:  Increase Clinimix E 5/15 to 50 ml/hr.   20% fat emulsion at 10 ml/hr.  Plan to advance as tolerated to the goal rate.  Diet Order:  TPN (CLINIMIX) Adult without lytes TPN (CLINIMIX-E) Adult  Skin:  Wound (see comment) (Unstageable, full thickness pressure injury to sacrum)  Last BM:  11/24  Height:   Ht Readings from Last 1 Encounters:  12/01/15 5\' 9"  (1.753 m)     Weight:   Wt Readings from Last 1 Encounters:  12/16/15 139 lb 15.9 oz (63.5 kg)    Ideal Body Weight:  65.9 kg  BMI:  Body mass index is 20.67 kg/m.  Estimated Nutritional Needs:   Kcal:  1350-1600  Protein:  70-80g  Fluid:  1.5-1.7L/day  EDUCATION NEEDS:   No education needs identified at this time  Tilda Franco, MS, RD, LDN Pager: [phone] After Hours Pager: [phone]

## 2015-12-16 NOTE — Progress Notes (Signed)
  General Surgery [facility]  Assessment & Plan:  Small bowel obstruction, status post ex lap with LOA POD#2             NG decompression, NPO, IV hydration             Await resolution of ileus Sacral decubitus             Debrided in OR 12/1             Dressing changes BID per nursing staff        Velora Heckler, MD, [facility]       Office: [phone]    Subjective: Somnolent, responds to touch.  NG with bilious.  Objective: Vital signs in last 24 hours: Temp:  [97.8 F (36.6 C)-99.4 F (37.4 C)] 99.4 F (37.4 C) (12/03 0413) Pulse Rate:  [30-146] 146 (12/03 0700) Resp:  [13-20] 13 (12/03 0700) BP: (85-111)/(46-84) 98/58 (12/03 0700) SpO2:  [96 %-100 %] 99 % (12/03 0700) Weight:  [63.5 kg (139 lb 15.9 oz)] 63.5 kg (139 lb 15.9 oz) (12/03 0500) Last BM Date:  (UTA)  Intake/Output from previous day: 12/02 0701 - 12/03 0700 In: 1741 [I.V.:1526; IV Piggyback:215] Out: 370 [Urine:370] Intake/Output this shift: No intake/output data recorded.  Physical Exam: HEENT - sclerae clear, mucous membranes moist Neck - soft Abdomen - soft without distension; few BS present; wound with small serosanguinous on honeycomb dressing  Lab Results:   Recent Labs  12/15/15 0359 12/16/15 0336  WBC 8.1 7.8  HGB 11.5* 9.7*  HCT 32.4* 27.2*  PLT 146* 139*   BMET  Recent Labs  12/15/15 0359 12/16/15 0336  NA 134* 132*  K 4.2 3.9  CL 106 105  CO2 21* 20*  GLUCOSE 147* 140*  BUN 23* 22*  CREATININE 1.28* 1.21*  CALCIUM 8.1* 8.3*   PT/INR No results for input(s): LABPROT, INR in the last 72 hours. Comprehensive Metabolic Panel:    Component Value Date/Time   NA 132 (L) 12/16/2015 0336   NA 134 (L) 12/15/2015 0359   NA 142 10/30/2015   NA 142 10/29/2015   K 3.9 12/16/2015 0336   K 4.2 12/15/2015 0359   CL 105 12/16/2015 0336   CL 106 12/15/2015 0359   CO2 20 (L) 12/16/2015 0336   CO2 21 (L) 12/15/2015 0359    BUN 22 (H) 12/16/2015 0336   BUN 23 (H) 12/15/2015 0359   BUN 9 10/30/2015   BUN 14 10/29/2015   CREATININE 1.21 (H) 12/16/2015 0336   CREATININE 1.28 (H) 12/15/2015 0359   GLUCOSE 140 (H) 12/16/2015 0336   GLUCOSE 147 (H) 12/15/2015 0359   CALCIUM 8.3 (L) 12/16/2015 0336   CALCIUM 8.1 (L) 12/15/2015 0359   AST 13 (L) 12/16/2015 0336   AST 16 12/15/2015 0359   ALT 11 (L) 12/16/2015 0336   ALT 12 (L) 12/15/2015 0359   ALKPHOS 54 12/16/2015 0336   ALKPHOS 53 12/15/2015 0359   BILITOT 0.6 12/16/2015 0336   BILITOT 1.4 (H) 12/15/2015 0359   PROT 5.5 (L) 12/16/2015 0336   PROT 5.6 (L) 12/15/2015 0359   ALBUMIN 2.4 (L) 12/16/2015 0336   ALBUMIN 2.6 (L) 12/15/2015 0359    Studies/Results: No results found.    Arvon Schreiner M 12/16/2015  Patient ID: Renie Ora, female   DOB: 1927-03-06, 82 y.o.   MRN: [id number]

## 2015-12-16 NOTE — Progress Notes (Signed)
ANTICOAGULATION CONSULT NOTE - Follow Up Consult  Pharmacy Consult for Heparin restart Indication: atrial fibrillation, hx of stroke, hx of DVT/PE (home Eliquis on hold)  Patient Measurements: Height: 5\' 9"  (175.3 cm) Weight: 139 lb 15.9 oz (63.5 kg) IBW/kg (Calculated) : 66.2 Heparin Dosing Weight: actual weight  Medications:  Infusions:  . amiodarone 30 mg/hr (12/16/15 2000)  . Marland KitchenTPN (CLINIMIX-E) Adult 50 mL/hr at 12/16/15 2000   And  . fat emulsion 240 mL (12/16/15 2000)  . heparin 900 Units/hr (12/16/15 2000)    Assessment: 70 yoF admitted with UTI/sepsis. PMH significant for a-fib, stroke, and DVT/PE on oral anticoagulation PTA with Eliquis, held for NPO status, SBO.  Pharmacy consulted to dose IV heparin for while Eliquis is on hold.    Significant events:  11/26: Eliquis was resumed  11/28: worsening SBO, eliquis d/ced, restart heparin gtt 12/01: hep drip d/ced at for exp lap with I&D of sacral wound and lysis of adhesion. Heparin held post-OR 12/3: ok with Dr. Gerrit Friends to resume heparin drip back   Today, 12/16/2015: - Hgb down 9.7, plt down 139 (but could be d/t ABLA from surgery) - no bleeding documented - NPO on TPN - 1830 HL < 0.1, no issues per RN  Goal of Therapy:  Heparin level 0.3-0.7 units/mL Monitor platelets by anticoagulation protocol: Yes   Plan:  - increase heparin to 1150 units/hr (no bolus d/t recent surgery) - check 8 hr heparin  - monitor for s/s bleeding  Arley Phenix RPh 12/16/2015, 8:23 PM Pager [phone]

## 2015-12-16 NOTE — Progress Notes (Signed)
PHARMACY - ADULT TOTAL PARENTERAL NUTRITION CONSULT NOTE   Pharmacy Consult for TPN Indication:recurrent SBO, prolonged ileus  Patient Measurements: Height: 5\' 9"  (175.3 cm) Weight: 139 lb 15.9 oz (63.5 kg) IBW/kg (Calculated) : 66.2 TPN AdjBW (KG): 54.2 Body mass index is 20.67 kg/m. Usual Weight:   Insulin Requirements: 2 units since start of TPN yesterday (sensitive SSI q6h)  Current Nutrition: NPO  IVF: none Central access: PICC TPN start date: 12/15/15  ASSESSMENT                                                                                                          HPI: [id number] y.o.femalewith medical history significant of CAD, dementia, DVT/PE, OSA, AFib on anticoagulation, recurrent SBO, CHF, NICM with mitral regurgitation, seizure disorder, recent stroke; presented with N/V, abd pain, and AMS. Recently admitted for SBO in October which improved with conservative mgmt. She was started on TPN on 12/03/15 and d/ced on 11/26.  Patient had limited oral intake since TPN was discontinued. S/p exp lap with lysis of adhesion and I&D of sacral wound on 12/01.  To start TPN back post-op on 12/2.  Significant events:  11/24: TPN stopped on 12/23 after family meeting with palliative care, however resume today/ per discussion with Dr Mahala MenghiniSamtani.   11/25: Diet started per surgery 11/26: Surgery recommending to advance to full liquids for dinner if tolerates clears through lunch. TPN d/ced 12/01: s/p exp lap with lysis of adhesion and I&D of sacral wound 12/02: resume TPN; used electrolyte free clinimix since total daily required volume was <1L  (d/t critical Clinimix shortage and availability of products in the pharmacy) 12/3: changed TPN to E 5/15 since total daily required volume now >1L    Today:   Glucose (goal <150): wnl; no hx DM  Electrolytes: Na slightly low at 132, K+, Phos, CorrCa and iCa wnl, Mag improved to 1.9 s/p mag sulfate 2 gm repletion on 12/2  Renal: scr 1.28  (crcl~29)  LFTs: AST/ALT wnl, Tbili silghtly elevated at 1.4  TGs: wnl (11/21), 95 (12/2)  Prealbumin: 17 (11/21), 6.9 (12/2)  NUTRITIONAL GOALS                                                                                             RD recs (12/01): Kcal:  1350-1600 Protein:  70-80g Fluid:  1.5-1.7L/day  Clinimix E 5/15 at a goal rate of 65 ml/hr + 20% fat emulsion at 5910ml/hr to provide: 78 g/day protein, 1588 Kcal/day.   PLAN  Now:  - Magnesium 1 gm IV x1   At 1800 today:  Increase Clinimix E 5/15 to 50 ml/hr.   20% fat emulsion at 10 ml/hr.  Plan to advance as tolerated to the goal rate.  TPN to contain standard multivitamins and trace elements.  continue sensitive SSI q6h.   TPN lab panels on Mondays & Thursdays.  F/u daily.  +++ Clinimix is in critical shortage and pharmacy has very limited supply in stock.  If Appropriate, consider switching patient off TPN as soon as possible+++   Tanysha Quant P 12/16/2015,7:28 AM

## 2015-12-16 NOTE — Progress Notes (Signed)
Subjective: Patient sleeping comfortably   Objective: Vitals:   12/16/15 0500 12/16/15 0530 12/16/15 0600 12/16/15 0700  BP: 106/67 (!) 97/59 (!) 95/55 (!) 98/58  Pulse: 74 92 84 (!) 146  Resp: 14 15 15 13   Temp:      TempSrc:      SpO2: 100% 100% 100% 99%  Weight: 139 lb 15.9 oz (63.5 kg)     Height:       Weight change: 9 lb 14.7 oz (4.5 kg)  Intake/Output Summary (Last 24 hours) at 12/16/15 0805 Last data filed at 12/16/15 0600  Gross per 24 hour  Intake          1640.96 ml  Output              320 ml  Net          1320.96 ml   I/O  - 2.3 L  General: Sleeping and in no acute distress Neck:  JVP is normal Heart: Regular rate and rhythm, without murmurs, rubs, gallops.  Lungs: Clear to auscultation.  No rales or wheezes. Exemities:  No edema.   Neuro: Grossly intact, nonfocal.  Tele:  Afib 90s    Lab Results: Results for orders placed or performed during the hospital encounter of 11/30/15 (from the past 24 hour(s))  Glucose, capillary     Status: Abnormal   Collection Time: 12/15/15 11:16 AM  Result Value Ref Range   Glucose-Capillary 160 (H) 65 - 99 mg/dL   Comment 1 Notify RN    Comment 2 Document in Chart   Glucose, capillary     Status: Abnormal   Collection Time: 12/15/15 11:50 PM  Result Value Ref Range   Glucose-Capillary 143 (H) 65 - 99 mg/dL   Comment 1 Notify RN   CBC     Status: Abnormal   Collection Time: 12/16/15  3:36 AM  Result Value Ref Range   WBC 7.8 4.0 - 10.5 K/uL   RBC 3.31 (L) 3.87 - 5.11 MIL/uL   Hemoglobin 9.7 (L) 12.0 - 15.0 g/dL   HCT 78.227.2 (L) 95.636.0 - 21.346.0 %   MCV 82.2 78.0 - 100.0 fL   MCH 29.3 26.0 - 34.0 pg   MCHC 35.7 30.0 - 36.0 g/dL   RDW 08.615.3 57.811.5 - 46.915.5 %   Platelets 139 (L) 150 - 400 K/uL  Magnesium     Status: None   Collection Time: 12/16/15  3:36 AM  Result Value Ref Range   Magnesium 1.9 1.7 - 2.4 mg/dL  Phosphorus     Status: None   Collection Time: 12/16/15  3:36 AM  Result Value Ref Range   Phosphorus 3.2  2.5 - 4.6 mg/dL  Comprehensive metabolic panel     Status: Abnormal   Collection Time: 12/16/15  3:36 AM  Result Value Ref Range   Sodium 132 (L) 135 - 145 mmol/L   Potassium 3.9 3.5 - 5.1 mmol/L   Chloride 105 101 - 111 mmol/L   CO2 20 (L) 22 - 32 mmol/L   Glucose, Bld 140 (H) 65 - 99 mg/dL   BUN 22 (H) 6 - 20 mg/dL   Creatinine, Ser 6.291.21 (H) 0.44 - 1.00 mg/dL   Calcium 8.3 (L) 8.9 - 10.3 mg/dL   Total Protein 5.5 (L) 6.5 - 8.1 g/dL   Albumin 2.4 (L) 3.5 - 5.0 g/dL   AST 13 (L) 15 - 41 U/L   ALT 11 (L) 14 - 54 U/L   Alkaline Phosphatase 54  38 - 126 U/L   Total Bilirubin 0.6 0.3 - 1.2 mg/dL   GFR calc non Af Amer 39 (L) >60 mL/min   GFR calc Af Amer 46 (L) >60 mL/min   Anion gap 7 5 - 15  Glucose, capillary     Status: Abnormal   Collection Time: 12/16/15  6:03 AM  Result Value Ref Range   Glucose-Capillary 128 (H) 65 - 99 mg/dL    Studies/Results: No results found.  Medications:Reviewed    @PROBHOSP @  1  Chronic atrial fib  Note plans to resume heparin  Rates are good  On amiodarone  WIll d/c diltiazem and follow   2  NICM Volume is OK    3  Mod to severe MR    LOS: 16 days   Dietrich Pates 12/16/2015, 8:05 AM

## 2015-12-16 NOTE — Progress Notes (Signed)
PROGRESS NOTE                                                                                                                                                                                                             Patient Demographics:    Brandi Heath, is a 82 y.o. female, DOB - July 27, 1927, ZOX:[id number]  Admit date - 11/30/2015   Admitting Physician Therisa Doyne, MD  Outpatient Primary MD for the patient is Merrilee Seashore, MD  LOS - 16  Outpatient Specialists: none   [facility] primary, Hospitalist now consulting for medical issues.  Chief Complaint  Patient presents with  . Emesis       Brief Narrative  81 year old female with history of A. 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  Patient having recurrent small bowel obstructions (4 episodes over the last few weeks). Surgery consulted and patient taken to or on 12/1For laparotomy and lysis of adhesions.  stepdown unit postop.   Subjective:   Remains in rapid A. fib. Cardizem dose adjusted and added amiodarone drip yesterday.   Assessment  & Plan :    Principal Problem:   Recurrent SBO (small bowel obstruction) Multiple small bowel obstruction (fourth episode in the past 2 months). Underwent laparotomy with lysis of adhesions on 12/1. Tolerated well. High perioperative cardiac risk. TPN resumed postop. Pain control per primary team.  Active Problems:   Atrial fibrillation with RVR (HCC) Heart rate still goes up to 140s. Continue Cardizem drip. Started on amiodarone drip after seen by cardiology. On IV heparin.  Sacral decubitus ulcer Debrided in the OR.  Seizure disorder On IV phenytoin.  Acute kidney injury Secondary to dehydration. Improving with fluids  Hypokalemia/hypomagnesemia Replenished.  Severe protein calorie  malnutrition Started TPN postop.  Anemia of chronic disease At baseline. Transfuse if <7    Moderate to severe dementia         Code Status : DO NOT RESUSCITATE  Family Communication  : None  Disposition Plan  : Pending hospital course  Barriers For Discharge : Active symptoms  Consults  :   [facility]  Procedures  :  Laparotomy with lysis of adhesions on 12/1  DVT Prophylaxis  : IV heparin  Lab Results  Component Value  Date   PLT 139 (L) 12/16/2015    Antibiotics  :    Anti-infectives    Start     Dose/Rate Route Frequency Ordered Stop   12/14/15 0600  clindamycin (CLEOCIN) IVPB 900 mg  Status:  Discontinued     900 mg 100 mL/hr over 30 Minutes Intravenous On call to O.R. 12/13/15 1305 12/14/15 1618   12/14/15 0600  gentamicin (GARAMYCIN) 310 mg in dextrose 5 % 100 mL IVPB  Status:  Discontinued     5 mg/kg  61.4 kg 107.8 mL/hr over 60 Minutes Intravenous On call to O.R. 12/13/15 1305 12/14/15 1618   12/03/15 0830  fluconazole (DIFLUCAN) tablet 150 mg     150 mg Oral  Once 12/03/15 0828 12/03/15 0859   12/02/15 2200  levofloxacin (LEVAQUIN) IVPB 500 mg  Status:  Discontinued     500 mg 100 mL/hr over 60 Minutes Intravenous Every 48 hours 11/30/15 2202 12/03/15 1036   11/30/15 2230  aztreonam (AZACTAM) 500 mg in dextrose 5 % 50 mL IVPB  Status:  Discontinued     500 mg 100 mL/hr over 30 Minutes Intravenous Every 8 hours 11/30/15 2203 12/01/15 0732   11/30/15 2145  levofloxacin (LEVAQUIN) IVPB 750 mg     750 mg 100 mL/hr over 90 Minutes Intravenous  Once 11/30/15 2130 11/30/15 2327   11/30/15 2145  aztreonam (AZACTAM) 2 g in dextrose 5 % 50 mL IVPB  Status:  Discontinued     2 g 100 mL/hr over 30 Minutes Intravenous  Once 11/30/15 2130 11/30/15 2203        Objective:   Vitals:   12/16/15 0530 12/16/15 0600 12/16/15 0700 12/16/15 0800  BP: (!) 97/59 (!) 95/55 (!) 98/58   Pulse: 92 84 (!) 146   Resp: 15 15 13    Temp:    98.5 F  (36.9 C)  TempSrc:    Oral  SpO2: 100% 100% 99%   Weight:      Height:        Wt Readings from Last 3 Encounters:  12/16/15 63.5 kg (139 lb 15.9 oz)  11/14/15 66.2 kg (146 lb)  11/14/15 72.6 kg (160 lb)     Intake/Output Summary (Last 24 hours) at 12/16/15 0930 Last data filed at 12/16/15 0600  Gross per 24 hour  Intake          1640.96 ml  Output              320 ml  Net          1320.96 ml     Physical Exam  Gen: Fatigue, HEENT: NG To wall suction, Dry mucosa, supple neck Chest: clear b/l, no added sounds CVS: S1 and S2 irregular, no murmurs GI: Midline laparotomy site clean, absent bowel sounds, Musculoskeletal: warm, no edema     Data Review:    CBC  Recent Labs Lab 12/10/15 1812 12/12/15 0123 12/13/15 0410 12/14/15 0429 12/14/15 1531 12/15/15 0359 12/16/15 0336  WBC 7.7 7.0 6.9 5.8  --  8.1 7.8  HGB 9.8* 8.6* 8.3* 7.8* 11.9* 11.5* 9.7*  HCT 27.8* 24.4* 23.8* 22.2* 35.0* 32.4* 27.2*  PLT 204 205 207 197  --  146* 139*  MCV 80.3 81.6 81.8 81.6  --  82.0 82.2  MCH 28.3 28.8 28.5 28.7  --  29.1 29.3  MCHC 35.3 35.2 34.9 35.1  --  35.5 35.7  RDW 14.6 14.9 14.9 14.8  --  14.7 15.3  LYMPHSABS 1.0  --   --   --   --  1.1  --   MONOABS 0.8  --   --   --   --  0.4  --   EOSABS 0.1  --   --   --   --  0.0  --   BASOSABS 0.0  --   --   --   --  0.0  --     Chemistries   Recent Labs Lab 12/10/15 0505 12/11/15 0336 12/12/15 0123 12/13/15 1024 12/14/15 0429 12/14/15 1531 12/15/15 0359 12/16/15 0336  NA 134* 137 139 141 134* 139 134* 132*  K 4.8 4.0 3.6 2.8* 4.4 3.6 4.2 3.9  CL 107 105 105 108 102  --  106 105  CO2 19* 23 22 23 24   --  21* 20*  GLUCOSE 89 107* 86 75 250*  --  147* 140*  BUN 15 22* 26* 25* 18  --  23* 22*  CREATININE 1.27* 1.65* 1.93* 1.50* 1.24*  --  1.28* 1.21*  CALCIUM 9.4 9.9 8.8* 8.8* 8.4*  --  8.1* 8.3*  MG  --  1.9  --  1.6*  --   --  1.5* 1.9  AST 21 18 17   --   --   --  16 13*  ALT 14 15 13*  --   --   --  12* 11*    ALKPHOS 64 76 57  --   --   --  53 54  BILITOT 0.9 1.3* 1.1  --   --   --  1.4* 0.6   ------------------------------------------------------------------------------------------------------------------  Recent Labs  12/15/15 0359  TRIG 95    Lab Results  Component Value Date   HGBA1C 5.9 (H) 12/03/2015   ------------------------------------------------------------------------------------------------------------------ No results for input(s): TSH, T4TOTAL, T3FREE, THYROIDAB in the last 72 hours.  Invalid input(s): FREET3 ------------------------------------------------------------------------------------------------------------------ No results for input(s): VITAMINB12, FOLATE, FERRITIN, TIBC, IRON, RETICCTPCT in the last 72 hours.  Coagulation profile No results for input(s): INR, PROTIME in the last 168 hours.  No results for input(s): DDIMER in the last 72 hours.  Cardiac Enzymes No results for input(s): CKMB, TROPONINI, MYOGLOBIN in the last 168 hours.  Invalid input(s): CK ------------------------------------------------------------------------------------------------------------------ No results found for: BNP  Inpatient Medications  Scheduled Meds: . chlorhexidine  15 mL Mouth Rinse BID  . insulin aspart  0-9 Units Subcutaneous Q6H  . latanoprost  1 drop Both Eyes QHS  . levETIRAcetam  750 mg Intravenous Q12H  . lip balm  1 application Topical BID  . mouth rinse  15 mL Mouth Rinse q12n4p  . MUSCLE RUB   Topical QHS  . pantoprazole (PROTONIX) IV  40 mg Intravenous Q12H  . sodium chloride flush  10-40 mL Intracatheter Q12H  . sodium chloride flush  3 mL Intravenous Q12H   Continuous Infusions: . amiodarone 30 mg/hr (12/16/15 2952)  . diltiazem (CARDIZEM) infusion 12.5 mg/hr (12/16/15 0745)  . TPN (CLINIMIX) Adult without lytes 30 mL/hr at 12/15/15 1744   And  . fat emulsion 240 mL (12/15/15 1745)   PRN Meds:.magic mouthwash, menthol-cetylpyridinium,  morphine injection, [DISCONTINUED] ondansetron **OR** ondansetron (ZOFRAN) IV, phenol, sodium chloride flush  Micro Results No results found for this or any previous visit (from the past 240 hour(s)).  Radiology Reports Ct Abdomen Pelvis Wo Contrast  Result Date: 11/30/2015 CLINICAL DATA:  Vomiting. Generalized abdominal pain and distention. EXAM: CT ABDOMEN AND PELVIS WITHOUT CONTRAST TECHNIQUE: Multidetector CT imaging of the abdomen and pelvis was performed following the standard protocol without IV contrast. COMPARISON:  10/01/2015 FINDINGS:  Lower chest: There is cardiomegaly. Chronic opacities, likely scarring in the lung bases. No effusions. Densely calcified coronary arteries noted. There is a moderate-sized hiatal hernia which is fluid-filled. Hepatobiliary: Prior cholecystectomy.  No focal hepatic abnormality. Pancreas: Diffuse atrophy of the pancreas without visualized focal abnormality or ductal dilatation. Spleen: No focal abnormality.  Normal size. Adrenals/Urinary Tract: Small cyst in the midpole of the right kidney. No hydronephrosis. Adrenal gland is unremarkable. Urinary bladder decompressed. Stomach/Bowel: The stomach is markedly distended with fluid. There are dilated small bowel loops in the right abdomen and extending into the pelvis. Transition point is in the mid pelvis on image 64, presumably related to adhesions. Findings compatible with distal small bowel obstruction. Colon is decompressed, grossly unremarkable. Vascular/Lymphatic: Prior stent graft repair of abdominal aortic aneurysm. The aneurysm sac size is 5.1 cm compared with 5.8 cm previously. No adenopathy. Reproductive: Prior hysterectomy.  No adnexal masses. Other: No free fluid or free air. Musculoskeletal: No acute bony abnormality or focal bone lesion. Diffuse degenerative disc and facet disease in the lumbar spine. IMPRESSION: Distal small bowel obstruction, likely related to adhesions. Moderate-sized fluid-filled  hiatal hernia. Coronary artery disease, aortic atherosclerosis. Prior stent graft repair of AAA. Decreasing aneurysm sac size since prior study. Electronically Signed   By: Charlett Nose M.D.   On: 11/30/2015 20:25   Dg Abd 1 View  Result Date: 12/11/2015 CLINICAL DATA:  Abdominal pain and distention for several days EXAM: ABDOMEN - 1 VIEW COMPARISON:  11/17/2015 FINDINGS: There again noted changes consistent with aortic stent graft placement. The overall appearance is stable. IVC filter is noted as well but somewhat obscured by the stent graft. Left external iliac artery stent is noted as well. Scattered large and small bowel gas is noted. Dilated small bowel is noted in the mid abdomen on the right. There is a relative paucity of bowel gas on the left which may be related to distention of the stomach. IMPRESSION: Changes consistent with at least partial small bowel obstruction. Electronically Signed   By: Alcide Clever M.D.   On: 12/11/2015 12:00   Dg Abd 1 View  Result Date: 12/01/2015 CLINICAL DATA:  Small bowel obstruction EXAM: ABDOMEN - 1 VIEW COMPARISON:  11/30/2015 FINDINGS: Dilated small bowel loops again noted right abdomen and pelvis compatible with small bowel obstruction, unchanged. NG tube remains in the stomach. Stent graft again noted. IVC filter noted. No free air or visible organomegaly. IMPRESSION: Continued small bowel dilatation compatible with small bowel obstruction. No real change. Electronically Signed   By: Charlett Nose M.D.   On: 12/01/2015 07:09   Dg Chest Port 1 View  Result Date: 12/02/2015 CLINICAL DATA:  Post PICC line insertion EXAM: PORTABLE CHEST 1 VIEW COMPARISON:  November 14, 2015 FINDINGS: The heart, hila, and mediastinum are unchanged. The right PICC line terminates near the cavoatrial junction. The distal tip of the NG tube is not assessed. There is tubing coiled back on itself projected over the midline of the upper chest, likely the portion of the NG tube  outside dictation. High attenuation material in the lung bases is stable, possibly aspirated material. No other interval changes or acute abnormalities. IMPRESSION: 1. The right PICC line terminates near the caval atrial junction. Electronically Signed   By: Gerome Sam III M.D   On: 12/02/2015 18:49   Dg Abd 2 Views  Result Date: 12/07/2015 CLINICAL DATA:  Small bowel obstruction. EXAM: ABDOMEN - 2 VIEW COMPARISON:  Single-view of the abdomen 12/02/2015 and  12/06/2015. FINDINGS: NG tube remains in place. Mild gaseous distention of a loop of distal small bowel at 3.2 cm is unchanged. No free intraperitoneal air is identified. Cholecystectomy clips, aneurysm coils and aortoiliac stent graft early again seen. IMPRESSION: No marked change in mild gas distention of small bowel since the most recent examination compatible with small-bowel obstruction. Electronically Signed   By: Drusilla Kanner M.D.   On: 12/07/2015 10:12   Dg Abd Portable 1v  Result Date: 12/06/2015 CLINICAL DATA:  Follow up partial small bowel obstruction. EXAM: PORTABLE ABDOMEN - 1 VIEW COMPARISON:  12/02/2015 dating back to 10/01/2015, including CT abdomen and pelvis 11/30/2015 and 10/01/2015. FINDINGS: Nasogastric tube tip projects over the expected location of the midbody of a J-shaped stomach, unchanged. Several dilated loops of small bowel persist in the abdomen and pelvis, though these loops are less distended than on the examination 4 days ago. Gas and stool is present in the decompressed colon. No suggestion of free air on the supine image. Prior aorto bi-iliac stent grafting for the previously identified abdominal aortic aneurysm. IVC filter in the lower IVC. Prior embolization of the right internal iliac artery. IMPRESSION: Persistent partial small bowel obstruction which is minimally improved since examination 4 days ago. Electronically Signed   By: Hulan Saas M.D.   On: 12/06/2015 10:56   Dg Abd Portable 1v  Result  Date: 12/02/2015 CLINICAL DATA:  Small bowel obstruction. EXAM: PORTABLE ABDOMEN - 1 VIEW COMPARISON:  December 01, 2015 FINDINGS: A small bowel obstruction persists. No contrast is identified on today's study. However, only minimal contrast is seen in stomach previously. NG tube terminates in the left upper quadrant. IMPRESSION: Persistent small bowel obstruction. Electronically Signed   By: Gerome Sam III M.D   On: 12/02/2015 16:01   Dg Abd Portable 1v-small Bowel Obstruction Protocol-initial, 8 Hr Delay  Result Date: 12/01/2015 CLINICAL DATA:  Small bowel obstruction. EXAM: PORTABLE ABDOMEN - 1 VIEW COMPARISON:  December 01, 2015 FINDINGS: The NG tube is stable. A small amount of contrast is seen in the stomach, new in the interval. No other contrast seen throughout the abdomen. Persistently dilated small bowel loops in the right abdomen consistent with small bowel obstruction. No other change. IMPRESSION: Small bowel obstruction. Electronically Signed   By: Gerome Sam III M.D   On: 12/01/2015 22:49   Dg Abd Portable 1v-small Bowel Protocol-position Verification  Result Date: 12/01/2015 CLINICAL DATA:  Followup small bowel obstruction. Nasogastric tube placement. EXAM: PORTABLE ABDOMEN - 1 VIEW COMPARISON:  12/01/2015 FINDINGS: Nasogastric tube is seen with tip in the body the stomach. Moderately dilated small bowel loops in the right abdomen show no significant change compared to recent study. Aortoiliac stent graft again seen as well as embolization coils adjacent to the right iliac limb of the graft. IVC filter again noted. IMPRESSION: Nasogastric tube tip remains in the body the stomach. No significant change in dilated small bowel loops, consistent with small bowel obstruction. Electronically Signed   By: Myles Rosenthal M.D.   On: 12/01/2015 12:49   Dg Abd Portable 1v  Result Date: 11/30/2015 CLINICAL DATA:  Nasogastric tube placement EXAM: PORTABLE ABDOMEN - 1 VIEW COMPARISON:   11/30/2015 CT FINDINGS: The nasogastric tube extends well into the stomach with tip in the region of the distal gastric body. IMPRESSION: Nasogastric tube extends into the stomach. Electronically Signed   By: Ellery Plunk M.D.   On: 11/30/2015 22:55    Time Spent in minutes  35  Eddie NorthHUNGEL, Alycen Mack M.D on 12/16/2015 at 9:30 AM  Between 7am to 7pm - Pager - [phone]  After 7pm go to [email or web address] - password [email or web address]  [facility] -  Office  [phone]

## 2015-12-16 NOTE — Progress Notes (Signed)
ANTICOAGULATION CONSULT NOTE - Follow Up Consult  Pharmacy Consult for Heparin restart Indication: atrial fibrillation, hx of stroke, hx of DVT/PE (home Eliquis on hold)  Patient Measurements: Height: 5\' 9"  (175.3 cm) Weight: 139 lb 15.9 oz (63.5 kg) IBW/kg (Calculated) : 66.2 Heparin Dosing Weight: actual weight  Medications:  Infusions:  . amiodarone 30 mg/hr (12/16/15 0614)  . diltiazem (CARDIZEM) infusion 12.5 mg/hr (12/16/15 0745)  . TPN (CLINIMIX) Adult without lytes 30 mL/hr at 12/15/15 1744   And  . fat emulsion 240 mL (12/15/15 1745)  . Marland KitchenTPN (CLINIMIX-E) Adult     And  . fat emulsion      Assessment: 37 yoF admitted with UTI/sepsis. PMH significant for a-fib, stroke, and DVT/PE on oral anticoagulation PTA with Eliquis, held for NPO status, SBO.  Pharmacy consulted to dose IV heparin for while Eliquis is on hold.    Significant events:  11/26: Eliquis was resumed  11/28: worsening SBO, eliquis d/ced, restart heparin gtt 12/01: hep drip d/ced at for exp lap with I&D of sacral wound and lysis of adhesion. Heparin held post-OR 12/3: ok with Dr. Gerrit Friends to resume heparin drip back   Today, 12/16/2015: - Hgb down 9.7, plt down 139 (but could be d/t ABLA from surgery) - no bleeding documented - NPO on TPN  Goal of Therapy:  Heparin level 0.3-0.7 units/mL Monitor platelets by anticoagulation protocol: Yes   Plan:  - resume heparin drip back at  900 units/hr (no bolus d/t recent surgery) - check 8 hr heparin  - monitor for s/s bleeding  Dorna Leitz, PharmD, BCPS 12/16/2015 9:59 AM

## 2015-12-17 ENCOUNTER — Encounter (HOSPITAL_COMMUNITY): Payer: Self-pay | Admitting: General Surgery

## 2015-12-17 DIAGNOSIS — I959 Hypotension, unspecified: Secondary | ICD-10-CM

## 2015-12-17 LAB — COMPREHENSIVE METABOLIC PANEL
ALK PHOS: 54 U/L (ref 38–126)
ALT: 11 U/L — AB (ref 14–54)
AST: 12 U/L — ABNORMAL LOW (ref 15–41)
Albumin: 2.4 g/dL — ABNORMAL LOW (ref 3.5–5.0)
Anion gap: 8 (ref 5–15)
BUN: 22 mg/dL — ABNORMAL HIGH (ref 6–20)
CALCIUM: 8.4 mg/dL — AB (ref 8.9–10.3)
CO2: 21 mmol/L — AB (ref 22–32)
CREATININE: 1.14 mg/dL — AB (ref 0.44–1.00)
Chloride: 104 mmol/L (ref 101–111)
GFR, EST AFRICAN AMERICAN: 49 mL/min — AB (ref >60)
GFR, EST NON AFRICAN AMERICAN: 42 mL/min — AB (ref >60)
Glucose, Bld: 125 mg/dL — ABNORMAL HIGH (ref 65–99)
Potassium: 3.3 mmol/L — ABNORMAL LOW (ref 3.5–5.1)
SODIUM: 133 mmol/L — AB (ref 135–145)
Total Bilirubin: 0.7 mg/dL (ref 0.3–1.2)
Total Protein: 5.5 g/dL — ABNORMAL LOW (ref 6.5–8.1)

## 2015-12-17 LAB — HEPARIN LEVEL (UNFRACTIONATED)
Heparin Unfractionated: 0.35 IU/mL (ref 0.30–0.70)
Heparin Unfractionated: 0.26 IU/mL — ABNORMAL LOW (ref 0.30–0.70)
Heparin Unfractionated: 0.29 IU/mL — ABNORMAL LOW (ref 0.30–0.70)

## 2015-12-17 LAB — PREALBUMIN: Prealbumin: 5.3 mg/dL — ABNORMAL LOW (ref 18–38)

## 2015-12-17 LAB — GLUCOSE, CAPILLARY
GLUCOSE-CAPILLARY: 129 mg/dL — AB (ref 65–99)
GLUCOSE-CAPILLARY: 141 mg/dL — AB (ref 65–99)
GLUCOSE-CAPILLARY: 121 mg/dL — AB (ref 65–99)
Glucose-Capillary: 124 mg/dL — ABNORMAL HIGH (ref 65–99)
Glucose-Capillary: 104 mg/dL — ABNORMAL HIGH (ref 65–99)

## 2015-12-17 LAB — DIFFERENTIAL
Basophils Absolute: 0 10*3/uL (ref 0.0–0.1)
Basophils Relative: 0 %
EOS PCT: 2 %
Eosinophils Absolute: 0.1 10*3/uL (ref 0.0–0.7)
LYMPHS PCT: 13 %
Lymphs Abs: 0.9 10*3/uL (ref 0.7–4.0)
MONO ABS: 0.5 10*3/uL (ref 0.1–1.0)
MONOS PCT: 7 %
Neutro Abs: 5.6 10*3/uL (ref 1.7–7.7)
Neutrophils Relative %: 78 %

## 2015-12-17 LAB — CBC
HCT: 27.1 % — ABNORMAL LOW (ref 36.0–46.0)
HEMOGLOBIN: 9.7 g/dL — AB (ref 12.0–15.0)
MCH: 29.2 pg (ref 26.0–34.0)
MCHC: 35.8 g/dL (ref 30.0–36.0)
MCV: 81.6 fL (ref 78.0–100.0)
Platelets: 150 10*3/uL (ref 150–400)
RBC: 3.32 MIL/uL — AB (ref 3.87–5.11)
RDW: 15.6 % — ABNORMAL HIGH (ref 11.5–15.5)
WBC: 7.2 10*3/uL (ref 4.0–10.5)

## 2015-12-17 LAB — TRIGLYCERIDES: TRIGLYCERIDES: 87 mg/dL (ref <150)

## 2015-12-17 LAB — PHOSPHORUS: PHOSPHORUS: 3.4 mg/dL (ref 2.5–4.6)

## 2015-12-17 LAB — MAGNESIUM: Magnesium: 1.8 mg/dL (ref 1.7–2.4)

## 2015-12-17 MED ORDER — HEPARIN (PORCINE) IN NACL 100-0.45 UNIT/ML-% IJ SOLN
1250.0000 [IU]/h | INTRAMUSCULAR | Status: DC
  Filled 2015-12-17: qty 250

## 2015-12-17 MED ORDER — SODIUM CHLORIDE 0.9 % IV SOLN
30.0000 meq | Freq: Once | INTRAVENOUS | Status: AC
  Administered 2015-12-17: 30 meq via INTRAVENOUS
  Filled 2015-12-17: qty 15

## 2015-12-17 MED ORDER — HEPARIN (PORCINE) IN NACL 100-0.45 UNIT/ML-% IJ SOLN
1300.0000 [IU]/h | INTRAMUSCULAR | Status: AC
  Administered 2015-12-18: 1300 [IU]/h via INTRAVENOUS
  Filled 2015-12-17 (×2): qty 250

## 2015-12-17 MED ORDER — FAT EMULSION 20 % IV EMUL
240.0000 mL | INTRAVENOUS | Status: AC
  Administered 2015-12-17: 240 mL via INTRAVENOUS
  Filled 2015-12-17 (×2): qty 250
  Filled 2015-12-17: qty 240

## 2015-12-17 MED ORDER — MAGNESIUM SULFATE 2 GM/50ML IV SOLN
2.0000 g | Freq: Once | INTRAVENOUS | Status: AC
  Administered 2015-12-17: 2 g via INTRAVENOUS
  Filled 2015-12-17: qty 50

## 2015-12-17 MED ORDER — TRACE MINERALS CR-CU-MN-SE-ZN 10-1000-500-60 MCG/ML IV SOLN
65.0000 mL/h | INTRAVENOUS | Status: AC
  Administered 2015-12-17: 17:00:00 via INTRAVENOUS
  Filled 2015-12-17 (×2): qty 1560

## 2015-12-17 MED ORDER — SODIUM CHLORIDE 0.9 % IV BOLUS (SEPSIS)
500.0000 mL | Freq: Once | INTRAVENOUS | Status: AC
  Administered 2015-12-17: 500 mL via INTRAVENOUS

## 2015-12-17 NOTE — Progress Notes (Signed)
ANTICOAGULATION CONSULT NOTE - Follow Up Consult  Pharmacy Consult for Heparin restart Indication: atrial fibrillation, hx of stroke, hx of DVT/PE (home Eliquis on hold)  Patient Measurements: Height: 5\' 9"  (175.3 cm) Weight: 138 lb 7.2 oz (62.8 kg) IBW/kg (Calculated) : 66.2 Heparin Dosing Weight: actual weight  Medications:  Infusions:  . amiodarone 30 mg/hr (12/17/15 1800)  . Marland KitchenTPN (CLINIMIX-E) Adult 65 mL/hr (12/17/15 1800)   And  . fat emulsion 240 mL (12/17/15 1818)  . heparin      Assessment: 84 yoF admitted with UTI/sepsis. PMH significant for a-fib, stroke, and DVT/PE on oral anticoagulation PTA with Eliquis, held for NPO status, SBO.  Pharmacy consulted to dose IV heparin for while Eliquis is on hold.    Significant events:  11/26: Eliquis was resumed  11/28: worsening SBO, eliquis d/ced, restart heparin gtt 12/01: hep drip d/ced at for exp lap with I&D of sacral wound and lysis of adhesion. Heparin held post-OR 12/3: ok with Dr. Gerrit Heath to resume heparin drip back   Today, 12/17/2015: - HL decreased to subtherapeutic -now 0.26.   - No bleeding or infusion issues noted per RN - Hgb low, stable overnight @ 9.7, plts WNL at low end of range (may be 2/2 ABLA from surgery 12/1) - NPO on TPN -2250 HL=0.29, RN stopped heparin x 2-3 mins when drawing level ?, heparin is running in peripheral line and HL drawn from central line, slight bleeding from IV site (RN reports thinks pt moved, bleeding minimal/resolved)  Goal of Therapy:  Heparin level 0.3-0.7 units/mL Monitor platelets by anticoagulation protocol: Yes   Plan:  - Increase heparin drip to 1300 units/hr - Recheck 8 hr heparin  - monitor for s/s bleeding  Brandi Heath 12/17/2015, 11:53 PM

## 2015-12-17 NOTE — Progress Notes (Signed)
PHARMACY - ADULT TOTAL PARENTERAL NUTRITION CONSULT NOTE   Pharmacy Consult for TPN Indication:recurrent SBO, prolonged ileus  Patient Measurements: Height: 5\' 9"  (175.3 cm) Weight: 138 lb 7.2 oz (62.8 kg) IBW/kg (Calculated) : 66.2 TPN AdjBW (KG): 54.2 Body mass index is 20.45 kg/m. Usual Weight:   Insulin Requirements: (sensitive SSI q6h)  Current Nutrition: NPO  IVF: none Central access: PICC TPN start date: 12/15/15  ASSESSMENT                                                                                                          HPI: 82 y.o.femalewith medical history significant of CAD, dementia, DVT/PE, OSA, AFib on anticoagulation, recurrent SBO, CHF, NICM with mitral regurgitation, seizure disorder, recent stroke; presented with N/V, abd pain, and AMS. Recently admitted for SBO in October which improved with conservative mgmt. She was started on TPN on 12/03/15 and d/ced on 11/26.  Patient had limited oral intake since TPN was discontinued. S/p exp lap with lysis of adhesion and I&D of sacral wound on 12/01.  To start TPN back post-op on 12/2.  Significant events:  11/24: TPN stopped on 12/23 after family meeting with palliative care, however resume today/ per discussion with Dr Mahala MenghiniSamtani.   11/25: Diet started per surgery 11/26: Surgery recommending to advance to full liquids for dinner if tolerates clears through lunch. TPN d/ced 12/01: s/p exp lap with lysis of adhesion and I&D of sacral wound 12/02: resume TPN; used electrolyte free clinimix since total daily required volume was <1L  (d/t critical Clinimix shortage and availability of products in the pharmacy) 12/3: changed TPN to E 5/15 since total daily required volume now >1L  Today:   Glucose (goal <150): wnl; no hx DM  Electrolytes: Na + K slightly low, Phos + CorrCa WNL, Mag at low end of normal range  Renal: AKI - improving  LFTs: AST/ALT + TBili WNL  TGs: wnl (11/21), 95 (12/2)  Prealbumin: 17  (11/21), 6.9 (12/2)  NUTRITIONAL GOALS                                                                                             RD recs (12/01): Kcal:  1350-1600 Protein:  70-80g Fluid:  1.5-1.7L/day  Clinimix E 5/15 at a goal rate of 65 ml/hr + 20% fat emulsion at 1710ml/hr to provide: 78 g/day protein, 1588 Kcal/day.  PLAN  This morning:  - Magnesium 2 gm IV x1 - KCl in NS 0.9% 265 ml x 1   At 1800 today:  Increase Clinimix E 5/15 to 65 ml/hr (goal rate)  20% fat emulsion at 10 ml/hr.  TPN to contain standard multivitamins and trace elements.  Continue sensitive SSI q6h.   TPN lab panels on Mondays & Thursdays.  Check Mg, Phos, CMET in AM  F/u daily.  +++ Clinimix is in critical shortage and pharmacy has very limited supply in stock.  If Appropriate, consider switching patient off TPN as soon as possible+++  Haynes Hoehn, PharmD, BCPS 12/17/2015, 7:56 AM  Pager: [phone]

## 2015-12-17 NOTE — Progress Notes (Addendum)
PROGRESS NOTE                                                                                                                                                                                                             Patient Demographics:    Brandi Heath, is a 82 y.o. female, DOB - [birth year], ZOX:[id number]  Admit date - 11/30/2015   Admitting Physician Therisa Doyne, MD  Outpatient Primary MD for the patient is Merrilee Seashore, MD  LOS - 17  Outpatient Specialists: none   [facility] primary, Hospitalist now consulting for medical issues.  Chief Complaint  Patient presents with  . Emesis       Brief Narrative  82 year old female with history of A. 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  Patient having recurrent small bowel obstructions (4 episodes over the last few weeks). Surgery consulted and patient taken to or on 12/1For laparotomy and lysis of adhesions.  stepdown unit postop.   Subjective:   Still has A. fib with RVR although rate better controlled. Denies abdominal pain. Has not passed any gas.   Assessment  & Plan :    Principal Problem:   Recurrent SBO (small bowel obstruction) Multiple small bowel obstruction (fourth episode in the past 2 months). Underwent laparotomy with lysis of adhesions on 12/1. Tolerated well. High perioperative cardiac risk. TPN resumed postop. Denies any pain. Still has ileus.  Active Problems:   Atrial fibrillation with RVR (HCC) Heart rate better controlled today. Of Cardizem drip. Continue amiodarone drip.. Continue Cardizem drip. Started on amiodarone drip after seen by cardiology. On IV heparin. Resume eliquis once able to take by mouth.  Sacral decubitus ulcer Debrided in the OR.  Seizure disorder On IV phenytoin.  Acute kidney  injury Secondary to dehydration. Improving with fluids  Hypokalemia/hypomagnesemia Replenished.  Hypotension Had few episodes of systolic blood pressure in the 70s. Resolved without intervention. Monitor closely.  Severe protein calorie malnutrition Started TPN postop.  Anemia of chronic disease At baseline. Transfuse if <7   Moderate to severe dementia         Code Status : DO NOT RESUSCITATE  Family Communication  : Discussed with daughter on the phone on 12/3.  Disposition Plan  : Pending hospital course  Barriers For Discharge : Active symptoms  Consults  :   [facility]  Procedures  :  Laparotomy with lysis of adhesions on 12/1 Sacral ulcer debridement on 12/1.  DVT Prophylaxis  : IV heparin  Lab Results  Component Value Date   PLT 150 12/17/2015    Antibiotics  :    Anti-infectives    Start     Dose/Rate Route Frequency Ordered Stop   12/14/15 0600  clindamycin (CLEOCIN) IVPB 900 mg  Status:  Discontinued     900 mg 100 mL/hr over 30 Minutes Intravenous On call to O.R. 12/13/15 1305 12/14/15 1618   12/14/15 0600  gentamicin (GARAMYCIN) 310 mg in dextrose 5 % 100 mL IVPB  Status:  Discontinued     5 mg/kg  61.4 kg 107.8 mL/hr over 60 Minutes Intravenous On call to O.R. 12/13/15 1305 12/14/15 1618   12/03/15 0830  fluconazole (DIFLUCAN) tablet 150 mg     150 mg Oral  Once 12/03/15 0828 12/03/15 0859   12/02/15 2200  levofloxacin (LEVAQUIN) IVPB 500 mg  Status:  Discontinued     500 mg 100 mL/hr over 60 Minutes Intravenous Every 48 hours 11/30/15 2202 12/03/15 1036   11/30/15 2230  aztreonam (AZACTAM) 500 mg in dextrose 5 % 50 mL IVPB  Status:  Discontinued     500 mg 100 mL/hr over 30 Minutes Intravenous Every 8 hours 11/30/15 2203 12/01/15 0732   11/30/15 2145  levofloxacin (LEVAQUIN) IVPB 750 mg     750 mg 100 mL/hr over 90 Minutes Intravenous  Once 11/30/15 2130 11/30/15 2327   11/30/15 2145  aztreonam (AZACTAM) 2 g in  dextrose 5 % 50 mL IVPB  Status:  Discontinued     2 g 100 mL/hr over 30 Minutes Intravenous  Once 11/30/15 2130 11/30/15 2203        Objective:   Vitals:   12/17/15 0709 12/17/15 0800 12/17/15 0900 12/17/15 1000  BP:  (!) 86/46 (!) 88/60   Pulse:  (!) 107 (!) 58 (!) 128  Resp:  17 17 15   Temp: 98.7 F (37.1 C) 98.7 F (37.1 C)    TempSrc: Oral Oral    SpO2:  100% 100% 99%  Weight:      Height:        Wt Readings from Last 3 Encounters:  12/17/15 62.8 kg (138 lb 7.2 oz)  11/14/15 66.2 kg (146 lb)  11/14/15 72.6 kg (160 lb)     Intake/Output Summary (Last 24 hours) at 12/17/15 1133 Last data filed at 12/17/15 1000  Gross per 24 hour  Intake          1842.96 ml  Output              965 ml  Net           877.96 ml     Physical Exam  WUJ:[id number]:Fatigue, not in distress HEENT: NG +, Dry mucosa, supple neck Chest: clear b/l, no added sounds CVS: S1 and S2 irregular, no murmurs GI: Midline laparotomy site clean, absent bowel sounds, Musculoskeletal: warm, no edema     Data Review:    CBC  Recent Labs Lab 12/10/15 1812  12/13/15 0410 12/14/15 0429 12/14/15 1531 12/15/15 0359 12/16/15 0336 12/17/15 0501  WBC 7.7  < > 6.9 5.8  --  8.1 7.8 7.2  HGB 9.8*  < > 8.3* 7.8* 11.9* 11.5* 9.7* 9.7*  HCT 27.8*  < > 23.8* 22.2* 35.0* 32.4* 27.2* 27.1*  PLT 204  < > 207 197  --  146* 139* 150  MCV 80.3  < > 81.8 81.6  --  82.0 82.2 81.6  MCH 28.3  < > 28.5 28.7  --  29.1 29.3 29.2  MCHC 35.3  < > 34.9 35.1  --  35.5 35.7 35.8  RDW 14.6  < > 14.9 14.8  --  14.7 15.3 15.6*  LYMPHSABS 1.0  --   --   --   --  1.1  --  0.9  MONOABS 0.8  --   --   --   --  0.4  --  0.5  EOSABS 0.1  --   --   --   --  0.0  --  0.1  BASOSABS 0.0  --   --   --   --  0.0  --  0.0  < > = values in this interval not displayed.  Chemistries   Recent Labs Lab 12/11/15 0336 12/12/15 0123 12/13/15 1024 12/14/15 0429 12/14/15 1531 12/15/15 0359 12/16/15 0336 12/17/15 0501  NA 137 139 141  134* 139 134* 132* 133*  K 4.0 3.6 2.8* 4.4 3.6 4.2 3.9 3.3*  CL 105 105 108 102  --  106 105 104  CO2 23 22 23 24   --  21* 20* 21*  GLUCOSE 107* 86 75 250*  --  147* 140* 125*  BUN 22* 26* 25* 18  --  23* 22* 22*  CREATININE 1.65* 1.93* 1.50* 1.24*  --  1.28* 1.21* 1.14*  CALCIUM 9.9 8.8* 8.8* 8.4*  --  8.1* 8.3* 8.4*  MG 1.9  --  1.6*  --   --  1.5* 1.9 1.8  AST 18 17  --   --   --  16 13* 12*  ALT 15 13*  --   --   --  12* 11* 11*  ALKPHOS 76 57  --   --   --  53 54 54  BILITOT 1.3* 1.1  --   --   --  1.4* 0.6 0.7   ------------------------------------------------------------------------------------------------------------------  Recent Labs  12/15/15 0359 12/17/15 0501  TRIG 95 87    Lab Results  Component Value Date   HGBA1C 5.9 (H) 12/03/2015   ------------------------------------------------------------------------------------------------------------------ No results for input(s): TSH, T4TOTAL, T3FREE, THYROIDAB in the last 72 hours.  Invalid input(s): FREET3 ------------------------------------------------------------------------------------------------------------------ No results for input(s): VITAMINB12, FOLATE, FERRITIN, TIBC, IRON, RETICCTPCT in the last 72 hours.  Coagulation profile No results for input(s): INR, PROTIME in the last 168 hours.  No results for input(s): DDIMER in the last 72 hours.  Cardiac Enzymes No results for input(s): CKMB, TROPONINI, MYOGLOBIN in the last 168 hours.  Invalid input(s): CK ------------------------------------------------------------------------------------------------------------------ No results found for: BNP  Inpatient Medications  Scheduled Meds: . chlorhexidine  15 mL Mouth Rinse BID  . insulin aspart  0-9 Units Subcutaneous Q6H  . latanoprost  1 drop Both Eyes QHS  . levETIRAcetam  750 mg Intravenous Q12H  . lip balm  1 application Topical BID  . mouth rinse  15 mL Mouth Rinse q12n4p  . MUSCLE RUB    Topical QHS  . pantoprazole (PROTONIX) IV  40 mg Intravenous Q12H  . potassium chloride (KCL MULTIRUN) 30 mEq in 265 mL IVPB  30 mEq Intravenous Once  . sodium chloride flush  10-40 mL Intracatheter Q12H  . sodium chloride flush  3 mL Intravenous Q12H   Continuous Infusions: . amiodarone 30 mg/hr (12/17/15 0900)  . Marland KitchenTPN (CLINIMIX-E)  Adult 50 mL/hr at 12/17/15 0900   And  . fat emulsion 240 mL (12/17/15 0900)  . Marland KitchenTPN (CLINIMIX-E) Adult     And  . fat emulsion    . heparin 1,150 Units/hr (12/17/15 0900)   PRN Meds:.magic mouthwash, menthol-cetylpyridinium, morphine injection, [DISCONTINUED] ondansetron **OR** ondansetron (ZOFRAN) IV, phenol, sodium chloride flush  Micro Results No results found for this or any previous visit (from the past 240 hour(s)).  Radiology Reports Ct Abdomen Pelvis Wo Contrast  Result Date: 11/30/2015 CLINICAL DATA:  Vomiting. Generalized abdominal pain and distention. EXAM: CT ABDOMEN AND PELVIS WITHOUT CONTRAST TECHNIQUE: Multidetector CT imaging of the abdomen and pelvis was performed following the standard protocol without IV contrast. COMPARISON:  10/01/2015 FINDINGS: Lower chest: There is cardiomegaly. Chronic opacities, likely scarring in the lung bases. No effusions. Densely calcified coronary arteries noted. There is a moderate-sized hiatal hernia which is fluid-filled. Hepatobiliary: Prior cholecystectomy.  No focal hepatic abnormality. Pancreas: Diffuse atrophy of the pancreas without visualized focal abnormality or ductal dilatation. Spleen: No focal abnormality.  Normal size. Adrenals/Urinary Tract: Small cyst in the midpole of the right kidney. No hydronephrosis. Adrenal gland is unremarkable. Urinary bladder decompressed. Stomach/Bowel: The stomach is markedly distended with fluid. There are dilated small bowel loops in the right abdomen and extending into the pelvis. Transition point is in the mid pelvis on image 64, presumably related to adhesions.  Findings compatible with distal small bowel obstruction. Colon is decompressed, grossly unremarkable. Vascular/Lymphatic: Prior stent graft repair of abdominal aortic aneurysm. The aneurysm sac size is 5.1 cm compared with 5.8 cm previously. No adenopathy. Reproductive: Prior hysterectomy.  No adnexal masses. Other: No free fluid or free air. Musculoskeletal: No acute bony abnormality or focal bone lesion. Diffuse degenerative disc and facet disease in the lumbar spine. IMPRESSION: Distal small bowel obstruction, likely related to adhesions. Moderate-sized fluid-filled hiatal hernia. Coronary artery disease, aortic atherosclerosis. Prior stent graft repair of AAA. Decreasing aneurysm sac size since prior study. Electronically Signed   By: Charlett Nose M.D.   On: 11/30/2015 20:25   Dg Abd 1 View  Result Date: 12/11/2015 CLINICAL DATA:  Abdominal pain and distention for several days EXAM: ABDOMEN - 1 VIEW COMPARISON:  11/17/2015 FINDINGS: There again noted changes consistent with aortic stent graft placement. The overall appearance is stable. IVC filter is noted as well but somewhat obscured by the stent graft. Left external iliac artery stent is noted as well. Scattered large and small bowel gas is noted. Dilated small bowel is noted in the mid abdomen on the right. There is a relative paucity of bowel gas on the left which may be related to distention of the stomach. IMPRESSION: Changes consistent with at least partial small bowel obstruction. Electronically Signed   By: Alcide Clever M.D.   On: 12/11/2015 12:00   Dg Abd 1 View  Result Date: 12/01/2015 CLINICAL DATA:  Small bowel obstruction EXAM: ABDOMEN - 1 VIEW COMPARISON:  11/30/2015 FINDINGS: Dilated small bowel loops again noted right abdomen and pelvis compatible with small bowel obstruction, unchanged. NG tube remains in the stomach. Stent graft again noted. IVC filter noted. No free air or visible organomegaly. IMPRESSION: Continued small bowel  dilatation compatible with small bowel obstruction. No real change. Electronically Signed   By: Charlett Nose M.D.   On: 12/01/2015 07:09   Dg Chest Port 1 View  Result Date: 12/02/2015 CLINICAL DATA:  Post PICC line insertion EXAM: PORTABLE CHEST 1 VIEW COMPARISON:  November 14, 2015 FINDINGS:  The heart, hila, and mediastinum are unchanged. The right PICC line terminates near the cavoatrial junction. The distal tip of the NG tube is not assessed. There is tubing coiled back on itself projected over the midline of the upper chest, likely the portion of the NG tube outside dictation. High attenuation material in the lung bases is stable, possibly aspirated material. No other interval changes or acute abnormalities. IMPRESSION: 1. The right PICC line terminates near the caval atrial junction. Electronically Signed   By: Gerome Sam III M.D   On: 12/02/2015 18:49   Dg Abd 2 Views  Result Date: 12/07/2015 CLINICAL DATA:  Small bowel obstruction. EXAM: ABDOMEN - 2 VIEW COMPARISON:  Single-view of the abdomen 12/02/2015 and 12/06/2015. FINDINGS: NG tube remains in place. Mild gaseous distention of a loop of distal small bowel at 3.2 cm is unchanged. No free intraperitoneal air is identified. Cholecystectomy clips, aneurysm coils and aortoiliac stent graft early again seen. IMPRESSION: No marked change in mild gas distention of small bowel since the most recent examination compatible with small-bowel obstruction. Electronically Signed   By: Drusilla Kanner M.D.   On: 12/07/2015 10:12   Dg Abd Portable 1v  Result Date: 12/06/2015 CLINICAL DATA:  Follow up partial small bowel obstruction. EXAM: PORTABLE ABDOMEN - 1 VIEW COMPARISON:  12/02/2015 dating back to 10/01/2015, including CT abdomen and pelvis 11/30/2015 and 10/01/2015. FINDINGS: Nasogastric tube tip projects over the expected location of the midbody of a J-shaped stomach, unchanged. Several dilated loops of small bowel persist in the abdomen and  pelvis, though these loops are less distended than on the examination 4 days ago. Gas and stool is present in the decompressed colon. No suggestion of free air on the supine image. Prior aorto bi-iliac stent grafting for the previously identified abdominal aortic aneurysm. IVC filter in the lower IVC. Prior embolization of the right internal iliac artery. IMPRESSION: Persistent partial small bowel obstruction which is minimally improved since examination 4 days ago. Electronically Signed   By: Hulan Saas M.D.   On: 12/06/2015 10:56   Dg Abd Portable 1v  Result Date: 12/02/2015 CLINICAL DATA:  Small bowel obstruction. EXAM: PORTABLE ABDOMEN - 1 VIEW COMPARISON:  December 01, 2015 FINDINGS: A small bowel obstruction persists. No contrast is identified on today's study. However, only minimal contrast is seen in stomach previously. NG tube terminates in the left upper quadrant. IMPRESSION: Persistent small bowel obstruction. Electronically Signed   By: Gerome Sam III M.D   On: 12/02/2015 16:01   Dg Abd Portable 1v-small Bowel Obstruction Protocol-initial, 8 Hr Delay  Result Date: 12/01/2015 CLINICAL DATA:  Small bowel obstruction. EXAM: PORTABLE ABDOMEN - 1 VIEW COMPARISON:  December 01, 2015 FINDINGS: The NG tube is stable. A small amount of contrast is seen in the stomach, new in the interval. No other contrast seen throughout the abdomen. Persistently dilated small bowel loops in the right abdomen consistent with small bowel obstruction. No other change. IMPRESSION: Small bowel obstruction. Electronically Signed   By: Gerome Sam III M.D   On: 12/01/2015 22:49   Dg Abd Portable 1v-small Bowel Protocol-position Verification  Result Date: 12/01/2015 CLINICAL DATA:  Followup small bowel obstruction. Nasogastric tube placement. EXAM: PORTABLE ABDOMEN - 1 VIEW COMPARISON:  12/01/2015 FINDINGS: Nasogastric tube is seen with tip in the body the stomach. Moderately dilated small bowel loops in  the right abdomen show no significant change compared to recent study. Aortoiliac stent graft again seen as well as embolization coils adjacent  to the right iliac limb of the graft. IVC filter again noted. IMPRESSION: Nasogastric tube tip remains in the body the stomach. No significant change in dilated small bowel loops, consistent with small bowel obstruction. Electronically Signed   By: Myles Rosenthal M.D.   On: 12/01/2015 12:49   Dg Abd Portable 1v  Result Date: 11/30/2015 CLINICAL DATA:  Nasogastric tube placement EXAM: PORTABLE ABDOMEN - 1 VIEW COMPARISON:  11/30/2015 CT FINDINGS: The nasogastric tube extends well into the stomach with tip in the region of the distal gastric body. IMPRESSION: Nasogastric tube extends into the stomach. Electronically Signed   By: Ellery Plunk M.D.   On: 11/30/2015 22:55    Time Spent in minutes  35   Eddie North M.D on 12/17/2015 at 11:33 AM  Between 7am to 7pm - Pager - [phone]  After 7pm go to [email or web address] - password [facility]  [facility] -  Office  [phone]

## 2015-12-17 NOTE — Progress Notes (Signed)
ANTICOAGULATION CONSULT NOTE - Follow Up Consult  Pharmacy Consult for Heparin restart Indication: atrial fibrillation, hx of stroke, hx of DVT/PE (home Eliquis on hold)  Patient Measurements: Height: 5\' 9"  (175.3 cm) Weight: 138 lb 7.2 oz (62.8 kg) IBW/kg (Calculated) : 66.2 Heparin Dosing Weight: actual weight  Medications:  Infusions:  . amiodarone 30 mg/hr (12/17/15 0626)  . Marland KitchenTPN (CLINIMIX-E) Adult 50 mL/hr at 12/16/15 2000   And  . fat emulsion 240 mL (12/16/15 2000)  . heparin 1,150 Units/hr (12/17/15 0536)    Assessment: 85 yoF admitted with UTI/sepsis. PMH significant for a-fib, stroke, and DVT/PE on oral anticoagulation PTA with Eliquis, held for NPO status, SBO.  Pharmacy consulted to dose IV heparin for while Eliquis is on hold.    Significant events:  11/26: Eliquis was resumed  11/28: worsening SBO, eliquis d/ced, restart heparin gtt 12/01: hep drip d/ced at for exp lap with I&D of sacral wound and lysis of adhesion. Heparin held post-OR 12/3: ok with Dr. Gerrit Friends to resume heparin drip back   Today, 12/17/2015: - HL now therapeutic @ 0.35 (goal 0.3-0.7) - No bleeding or infusion issues noted - Hgb low, stable overnight @ 9.7, plts WNL at low end of range (may be 2/2 ABLA from surgery 12/1) - NPO on TPN  Goal of Therapy:  Heparin level 0.3-0.7 units/mL Monitor platelets by anticoagulation protocol: Yes   Plan:  - Continue heparin at 1150 units/hr  - Recheck 8 hr heparin  - monitor for s/s bleeding  Haynes Hoehn, PharmD, BCPS 12/17/2015, 7:30 AM  Pager: [phone]

## 2015-12-17 NOTE — Progress Notes (Signed)
CSW continues to follow to assist with d/c planning. CSW met with pt / daughter at bedside to offer support. Pt remains on TPN in ICU/SDU. CSW will continue to follow to assist with d/c planning needs.  Werner Lean LCSW [phone]

## 2015-12-17 NOTE — Progress Notes (Signed)
Subjective: Patient sleeping comfortably   Objective: Vitals:   12/17/15 0530 12/17/15 0600 12/17/15 0709 12/17/15 0800  BP: 100/70 102/69  (!) 86/46  Pulse: (!) 59 (!) 144  (!) 107  Resp: 17 19  17   Temp:   98.7 F (37.1 C) 98.7 F (37.1 C)  TempSrc:   Oral Oral  SpO2: 100% 99%  100%  Weight:      Height:       Weight change: -0.7 kg (-1 lb 8.7 oz)  Intake/Output Summary (Last 24 hours) at 12/17/15 0955 Last data filed at 12/17/15 0800  Gross per 24 hour  Intake          1893.16 ml  Output              990 ml  Net           903.16 ml   I/O  - 2.3 L  Affect appropriate Elderly black female  HEENT: normal Neck supple with no adenopathy JVP normal no bruits no thyromegaly Lungs clear with no wheezing and good diaphragmatic motion Heart:  S1/S2 no murmur, no rub, gallop or click PMI normal Abdomen: post op NG tube lots secretions  no bruit.  No HSM or HJR Distal pulses intact with no bruits No edema Neuro non-focal Dementia Skin warm and dry sacral decubitus  PIC line RUE   Tele:  Afib 90s  To 120's 12/17/2015   Lab Results: Results for orders placed or performed during the hospital encounter of 11/30/15 (from the past 24 hour(s))  Glucose, capillary     Status: Abnormal   Collection Time: 12/16/15 11:22 AM  Result Value Ref Range   Glucose-Capillary 126 (H) 65 - 99 mg/dL   Comment 1 Notify RN    Comment 2 Document in Chart   Glucose, capillary     Status: Abnormal   Collection Time: 12/16/15  6:05 PM  Result Value Ref Range   Glucose-Capillary 141 (H) 65 - 99 mg/dL   Comment 1 Notify RN    Comment 2 Document in Chart   Heparin level (unfractionated)     Status: Abnormal   Collection Time: 12/16/15  6:30 PM  Result Value Ref Range   Heparin Unfractionated <0.10 (L) 0.30 - 0.70 IU/mL  Glucose, capillary     Status: Abnormal   Collection Time: 12/16/15 11:44 PM  Result Value Ref Range   Glucose-Capillary 129 (H) 65 - 99 mg/dL   Comment 1 Notify RN     CBC     Status: Abnormal   Collection Time: 12/17/15  5:01 AM  Result Value Ref Range   WBC 7.2 4.0 - 10.5 K/uL   RBC 3.32 (L) 3.87 - 5.11 MIL/uL   Hemoglobin 9.7 (L) 12.0 - 15.0 g/dL   HCT 16.127.1 (L) 09.636.0 - 04.546.0 %   MCV 81.6 78.0 - 100.0 fL   MCH 29.2 26.0 - 34.0 pg   MCHC 35.8 30.0 - 36.0 g/dL   RDW 40.915.6 (H) 81.111.5 - 91.415.5 %   Platelets 150 150 - 400 K/uL  Comprehensive metabolic panel     Status: Abnormal   Collection Time: 12/17/15  5:01 AM  Result Value Ref Range   Sodium 133 (L) 135 - 145 mmol/L   Potassium 3.3 (L) 3.5 - 5.1 mmol/L   Chloride 104 101 - 111 mmol/L   CO2 21 (L) 22 - 32 mmol/L   Glucose, Bld 125 (H) 65 - 99 mg/dL   BUN 22 (H) 6 -  20 mg/dL   Creatinine, Ser 1.61 (H) 0.44 - 1.00 mg/dL   Calcium 8.4 (L) 8.9 - 10.3 mg/dL   Total Protein 5.5 (L) 6.5 - 8.1 g/dL   Albumin 2.4 (L) 3.5 - 5.0 g/dL   AST 12 (L) 15 - 41 U/L   ALT 11 (L) 14 - 54 U/L   Alkaline Phosphatase 54 38 - 126 U/L   Total Bilirubin 0.7 0.3 - 1.2 mg/dL   GFR calc non Af Amer 42 (L) >60 mL/min   GFR calc Af Amer 49 (L) >60 mL/min   Anion gap 8 5 - 15  Magnesium     Status: None   Collection Time: 12/17/15  5:01 AM  Result Value Ref Range   Magnesium 1.8 1.7 - 2.4 mg/dL  Phosphorus     Status: None   Collection Time: 12/17/15  5:01 AM  Result Value Ref Range   Phosphorus 3.4 2.5 - 4.6 mg/dL  Differential     Status: None   Collection Time: 12/17/15  5:01 AM  Result Value Ref Range   Neutrophils Relative % 78 %   Neutro Abs 5.6 1.7 - 7.7 K/uL   Lymphocytes Relative 13 %   Lymphs Abs 0.9 0.7 - 4.0 K/uL   Monocytes Relative 7 %   Monocytes Absolute 0.5 0.1 - 1.0 K/uL   Eosinophils Relative 2 %   Eosinophils Absolute 0.1 0.0 - 0.7 K/uL   Basophils Relative 0 %   Basophils Absolute 0.0 0.0 - 0.1 K/uL  Triglycerides     Status: None   Collection Time: 12/17/15  5:01 AM  Result Value Ref Range   Triglycerides 87 <150 mg/dL  Prealbumin     Status: Abnormal   Collection Time: 12/17/15  5:01 AM   Result Value Ref Range   Prealbumin 5.3 (L) 18 - 38 mg/dL  Heparin level (unfractionated)     Status: None   Collection Time: 12/17/15  5:01 AM  Result Value Ref Range   Heparin Unfractionated 0.35 0.30 - 0.70 IU/mL  Glucose, capillary     Status: Abnormal   Collection Time: 12/17/15  6:17 AM  Result Value Ref Range   Glucose-Capillary 141 (H) 65 - 99 mg/dL    Studies/Results: No results found.  Medications:Reviewed   Current Facility-Administered Medications:  .  amiodarone (NEXTERONE PREMIX) 360-4.14 MG/200ML-% (1.8 mg/mL) IV infusion, 30 mg/hr, Intravenous, Continuous, Nishant Dhungel, MD, Last Rate: 16.7 mL/hr at 12/17/15 0800, 30 mg/hr at 12/17/15 0800 .  chlorhexidine (PERIDEX) 0.12 % solution 15 mL, 15 mL, Mouth Rinse, BID, Therisa Doyne, MD, 15 mL at 12/17/15 0947 .  TPN (CLINIMIX-E) Adult, , Intravenous, Continuous TPN, Last Rate: 50 mL/hr at 12/17/15 0800 **AND** fat emulsion 20 % infusion 240 mL, 240 mL, Intravenous, Continuous TPN, Anh P Pham, RPH, Last Rate: 10 mL/hr at 12/17/15 0800, 240 mL at 12/17/15 0800 .  TPN (CLINIMIX-E) Adult, 65 mL/hr, Intravenous, Continuous TPN **AND** fat emulsion 20 % infusion 240 mL, 240 mL, Intravenous, Continuous TPN, Nishant Dhungel, MD .  heparin ADULT infusion 100 units/mL (25000 units/218mL sodium chloride 0.45%), 1,150 Units/hr, Intravenous, Continuous, Nishant Dhungel, MD, Last Rate: 11.5 mL/hr at 12/17/15 0800, 1,150 Units/hr at 12/17/15 0800 .  insulin aspart (novoLOG) injection 0-9 Units, 0-9 Units, Subcutaneous, Q6H, Anh Helayne [place]in[place], RPH, 1 Units at 12/17/15 0960 .  latanoprost (XALATAN) 0.005 % ophthalmic solution 1 drop, 1 drop, Both Eyes, QHS, Nishant Dhungel, MD, 1 drop at 12/16/15 2218 .  levETIRAcetam (KEPPRA) 750 mg  in sodium chloride 0.9 % 100 mL IVPB, 750 mg, Intravenous, Q12H, Rhetta Mura, MD, 750 mg at 12/17/15 0212 .  lip balm (CARMEX) ointment 1 application, 1 application, Topical, BID, Karie Soda, MD, 1  application at 12/17/15 1000 .  magic mouthwash, 15 mL, Oral, QID PRN, Karie Soda, MD .  MEDLINE mouth rinse, 15 mL, Mouth Rinse, q12n4p, Therisa Doyne, MD, 15 mL at 12/16/15 1600 .  menthol-cetylpyridinium (CEPACOL) lozenge 3 mg, 1 lozenge, Oral, PRN, Karie Soda, MD .  morphine 2 MG/ML injection 1-2 mg, 1-2 mg, Intravenous, Q1H PRN, Darnell Level, MD, 2 mg at 12/16/15 0101 .  MUSCLE RUB CREA, , Topical, QHS, Marianne L York, PA-C .  [DISCONTINUED] ondansetron (ZOFRAN) tablet 4 mg, 4 mg, Oral, Q6H PRN **OR** ondansetron (ZOFRAN) injection 4 mg, 4 mg, Intravenous, Q6H PRN, Therisa Doyne, MD, 4 mg at 12/14/15 1516 .  pantoprazole (PROTONIX) injection 40 mg, 40 mg, Intravenous, Q12H, Starleen Arms, MD, 40 mg at 12/17/15 0947 .  phenol (CHLORASEPTIC) mouth spray 2 spray, 2 spray, Mouth/Throat, PRN, Karie Soda, MD .  potassium chloride 30 mEq in sodium chloride 0.9 % 265 mL (KCL MULTIRUN) IVPB, 30 mEq, Intravenous, Once, Nishant Dhungel, MD .  sodium chloride flush (NS) 0.9 % injection 10-40 mL, 10-40 mL, Intracatheter, Q12H, Leana Roe Elgergawy, MD, 10 mL at 12/17/15 1000 .  sodium chloride flush (NS) 0.9 % injection 10-40 mL, 10-40 mL, Intracatheter, PRN, Starleen Arms, MD, 10 mL at 12/15/15 2132 .  sodium chloride flush (NS) 0.9 % injection 3 mL, 3 mL, Intravenous, Q12H, Therisa Doyne, MD, 3 mL at 12/17/15 1000   AP:  Afib:  Resume eliquis when taking PO on heparin and iv amiodarone BP soft Iv cardizem d/c   DCM:  euvoleumic cannot hear MR murmur on exam   Discussed care with daughter at bedside  Charlton Haws

## 2015-12-17 NOTE — Progress Notes (Signed)
ANTICOAGULATION CONSULT NOTE - Follow Up Consult  Pharmacy Consult for Heparin restart Indication: atrial fibrillation, hx of stroke, hx of DVT/PE (home Eliquis on hold)  Patient Measurements: Height: 5\' 9"  (175.3 cm) Weight: 138 lb 7.2 oz (62.8 kg) IBW/kg (Calculated) : 66.2 Heparin Dosing Weight: actual weight  Medications:  Infusions:  . amiodarone 30 mg/hr (12/17/15 1400)  . Marland KitchenTPN (CLINIMIX-E) Adult 50 mL/hr at 12/17/15 1400   And  . fat emulsion 240 mL (12/17/15 1400)  . Marland KitchenTPN (CLINIMIX-E) Adult     And  . fat emulsion    . heparin 1,150 Units/hr (12/17/15 1400)    Assessment: 22 yoF admitted with UTI/sepsis. PMH significant for a-fib, stroke, and DVT/PE on oral anticoagulation PTA with Eliquis, held for NPO status, SBO.  Pharmacy consulted to dose IV heparin for while Eliquis is on hold.    Significant events:  11/26: Eliquis was resumed  11/28: worsening SBO, eliquis d/ced, restart heparin gtt 12/01: hep drip d/ced at for exp lap with I&D of sacral wound and lysis of adhesion. Heparin held post-OR 12/3: ok with Dr. Gerrit Friends to resume heparin drip back   Today, 12/17/2015: - HL decreased to subtherapeutic -now 0.26.   - No bleeding or infusion issues noted per RN - Hgb low, stable overnight @ 9.7, plts WNL at low end of range (may be 2/2 ABLA from surgery 12/1) - NPO on TPN  Goal of Therapy:  Heparin level 0.3-0.7 units/mL Monitor platelets by anticoagulation protocol: Yes   Plan:  - Increase heparin at 1250 units/hr  - Recheck 8 hr heparin  - monitor for s/s bleeding  Haynes Hoehn, PharmD, BCPS 12/17/2015, 2:46 PM  Pager: [phone]

## 2015-12-17 NOTE — Progress Notes (Signed)
Nutrition Follow-up  DOCUMENTATION CODES:   Severe malnutrition in context of acute illness/injury, Underweight  INTERVENTION:  - Continue TPN per pharmacy. - RD will follow-up 12/5. - Monitor magnesium, potassium, and phosphorus daily for at least 3 days, MD to replete as needed, as pt is at risk for refeeding syndrome given severe malnutrition, TPN to advance to goal rate this evening.  NUTRITION DIAGNOSIS:   Inadequate oral intake related to inability to eat as evidenced by NPO status. -ongoing  GOAL:   Patient will meet greater than or equal to 90% of their needs -met for kcal and not for protein with current TPN rate.   MONITOR:   Labs, Weight trends, Skin, I & O's, Other (Comment) (TPN )  ASSESSMENT:   82 y.o. female with medical history significant of  CAD, dementia, DVT, OSA, D, atrial fibrillation on anticoagulation, recurrent small bowel obstruction, CHF nonischemic cardiomyopathy with mitral regurgitation, seizure disorder, recent stroke, As with abdominal pain, vomiting and altered mental status.  12/4 Pt remains NPO and is currently receiving Clinimix E 5/15 @ 50 mL/hr with 20% ILE @ 10 mL/hr. Per Pharmacy note this AM, plan to advance to goal TPN rate today at 1800: Clinimix E 5/15 @ 65 mL/hr with 20% ILE @ 10 mL/hr. At goal rate, this regimen will provide 78 grams of protein and 1588 kcal. Weight stable from yesterday and -3.4 kg from admission. NGT in place to suction with documented 135cc output this shift. Pt denies any abdominal pain or nausea at time of RD visit this AM.  Medications reviewed; sliding scale Novolog, 750 mcg IV Keppra BID, 2 g IV Mg sulfate x1 dose today, PRN IV Zofran, 40 mg IV Protonix BID, 30 mEq IV KCl x1 dose today. Labs reviewed; CBGs: 141 mg/dL, Na: 133 mmol/L, BUN: 22 mg/dL, creatinine: 1.14 mg/dL, Ca: 8.4 mg/dL, GFR: 49 mL/min, K: 3.3 mmol/L, Mg and Phos WDL this AM.   Drips: Heparin @ 1150 units/hr, Amiodarone @ 30 mg/hr.      12/3 12/1: s/p EXPLORATORY LAPAROTOMY POSSIBLE SMALL BOWEL RESECTION WITH COLOSTOMY DEBRIDMENT OF SACRAL DECUBITUS ULCER, skin subcutaneoustissue and fascia 6 x 6 cm  Patient receiving Clinimix 5/15 @ 30 ml/hr and ILE @ 10 ml/hr, providing 991 kcal and 36 g protein. Per Pharmacy note, pt will switch to electrolyte Clinimix today.    Diet Order:  TPN (CLINIMIX-E) Adult TPN (CLINIMIX-E) Adult  Skin:  Wound (see comment) (Unstageable, full thickness sacral pressure injury)  Last BM:  11/30  Height:   Ht Readings from Last 1 Encounters:  12/01/15 5' 9"  (1.753 m)    Weight:   Wt Readings from Last 1 Encounters:  12/17/15 138 lb 7.2 oz (62.8 kg)    Ideal Body Weight:  65.9 kg  BMI:  Body mass index is 20.45 kg/m.  Estimated Nutritional Needs:   Kcal:  1350-1600  Protein:  70-80g  Fluid:  1.5-1.7L/day  EDUCATION NEEDS:   No education needs identified at this time    Jarome Matin, MS, RD, LDN, CNSC Inpatient Clinical Dietitian Pager # [phone] After hours/weekend pager # [phone]

## 2015-12-18 DIAGNOSIS — I4891 Unspecified atrial fibrillation: Secondary | ICD-10-CM

## 2015-12-18 LAB — COMPREHENSIVE METABOLIC PANEL
ALK PHOS: 46 U/L (ref 38–126)
ALT: 12 U/L — AB (ref 14–54)
ANION GAP: 5 (ref 5–15)
AST: 15 U/L (ref 15–41)
Albumin: 2.3 g/dL — ABNORMAL LOW (ref 3.5–5.0)
BUN: 26 mg/dL — ABNORMAL HIGH (ref 6–20)
CALCIUM: 8.5 mg/dL — AB (ref 8.9–10.3)
CO2: 22 mmol/L (ref 22–32)
CREATININE: 0.92 mg/dL (ref 0.44–1.00)
Chloride: 108 mmol/L (ref 101–111)
GFR, EST NON AFRICAN AMERICAN: 55 mL/min — AB (ref >60)
Glucose, Bld: 131 mg/dL — ABNORMAL HIGH (ref 65–99)
Potassium: 3.8 mmol/L (ref 3.5–5.1)
SODIUM: 135 mmol/L (ref 135–145)
TOTAL PROTEIN: 5.2 g/dL — AB (ref 6.5–8.1)
Total Bilirubin: 0.4 mg/dL (ref 0.3–1.2)

## 2015-12-18 LAB — PHOSPHORUS: PHOSPHORUS: 3.4 mg/dL (ref 2.5–4.6)

## 2015-12-18 LAB — CBC
HEMATOCRIT: 25.2 % — AB (ref 36.0–46.0)
Hemoglobin: 8.9 g/dL — ABNORMAL LOW (ref 12.0–15.0)
MCH: 28.5 pg (ref 26.0–34.0)
MCHC: 35.3 g/dL (ref 30.0–36.0)
MCV: 80.8 fL (ref 78.0–100.0)
PLATELETS: 157 10*3/uL (ref 150–400)
RBC: 3.12 MIL/uL — ABNORMAL LOW (ref 3.87–5.11)
RDW: 15.5 % (ref 11.5–15.5)
WBC: 6.1 10*3/uL (ref 4.0–10.5)

## 2015-12-18 LAB — TYPE AND SCREEN
Blood Product Expiration Date: 201712122359
Blood Product Expiration Date: 201712122359
Blood Product Expiration Date: 201712152359
Blood Product Expiration Date: 201712152359
ISSUE DATE / TIME: 201712011323
ISSUE DATE / TIME: 201712011323
ISSUE DATE / TIME: 201712011428
ISSUE DATE / TIME: 201712011428
UNIT TYPE AND RH: 5100
UNIT TYPE AND RH: 5100
Unit Type and Rh: 5100
Unit Type and Rh: 5100

## 2015-12-18 LAB — MAGNESIUM: MAGNESIUM: 1.8 mg/dL (ref 1.7–2.4)

## 2015-12-18 LAB — GLUCOSE, CAPILLARY
GLUCOSE-CAPILLARY: 132 mg/dL — AB (ref 65–99)
Glucose-Capillary: 115 mg/dL — ABNORMAL HIGH (ref 65–99)
Glucose-Capillary: 101 mg/dL — ABNORMAL HIGH (ref 65–99)

## 2015-12-18 LAB — HEPARIN LEVEL (UNFRACTIONATED): Heparin Unfractionated: 0.42 IU/mL (ref 0.30–0.70)

## 2015-12-18 MED ORDER — MAGNESIUM SULFATE 2 GM/50ML IV SOLN
2.0000 g | Freq: Once | INTRAVENOUS | Status: AC
  Administered 2015-12-18: 2 g via INTRAVENOUS
  Filled 2015-12-18: qty 50

## 2015-12-18 MED ORDER — APIXABAN 5 MG PO TABS: 5.0000 mg | ORAL_TABLET | Freq: Two times a day (BID) | ORAL | Status: DC

## 2015-12-18 MED ORDER — PRO-STAT SUGAR FREE PO LIQD
30.0000 mL | Freq: Two times a day (BID) | ORAL | Status: DC
  Administered 2015-12-18 – 2015-12-21 (×7): 30 mL via ORAL
  Filled 2015-12-18 (×7): qty 30

## 2015-12-18 MED ORDER — BOOST / RESOURCE BREEZE PO LIQD
1.0000 | Freq: Two times a day (BID) | ORAL | Status: DC
  Administered 2015-12-18 – 2015-12-21 (×3): 1 via ORAL

## 2015-12-18 MED ORDER — APIXABAN 2.5 MG PO TABS
2.5000 mg | ORAL_TABLET | Freq: Two times a day (BID) | ORAL | Status: DC
  Administered 2015-12-18 – 2015-12-25 (×15): 2.5 mg via ORAL
  Filled 2015-12-18 (×15): qty 1

## 2015-12-18 NOTE — Progress Notes (Signed)
ANTICOAGULATION CONSULT NOTE - Follow Up Consult  Pharmacy Consult for Heparin restart Indication: atrial fibrillation, hx of stroke, hx of DVT/PE (home Eliquis on hold)  Patient Measurements: Height: 5\' 9"  (175.3 cm) Weight: 139 lb 15.9 oz (63.5 kg) IBW/kg (Calculated) : 66.2 Heparin Dosing Weight: actual weight  Medications:  Infusions:  . amiodarone 30 mg/hr (12/18/15 0432)  . Marland KitchenTPN (CLINIMIX-E) Adult 65 mL/hr (12/17/15 1800)   And  . fat emulsion 240 mL (12/17/15 1818)  . heparin 1,300 Units/hr (12/18/15 0135)    Assessment: 30 yoF admitted with UTI/sepsis. PMH significant for a-fib, stroke, and DVT/PE on oral anticoagulation PTA with Eliquis, held for NPO status, SBO.  Pharmacy consulted to dose IV heparin for while Eliquis is on hold.    Significant events:  11/26: Eliquis was resumed  11/28: worsening SBO, eliquis d/ced, restart heparin gtt 12/01: hep drip d/ced at for exp lap with I&D of sacral wound and lysis of adhesion. Heparin held post-OR 12/3: ok with Dr. Gerrit Friends to resume heparin drip   Today, 12/18/2015: - HL 0.42, increased to therapeutic range - No bleeding or infusion issues noted per RN (slight bleeding from IV site noted on 12/4 PM)  - Hgb low, decreased to 9; Plts WNL at low end of range (may be 2/2 ABLA from surgery 12/1)  Goal of Therapy:  Heparin level 0.3-0.7 units/mL Monitor platelets by anticoagulation protocol: Yes   Plan:   Continue heparin IV infusion at 1300 units/hr  Recheck heparin level in 8 hours to confirm therapeutic rate.  Daily heparin level and CBC  Continue to monitor H&H and platelets    Lynann Beaver PharmD, BCPS Pager [phone] 12/18/2015 10:08 AM

## 2015-12-18 NOTE — Progress Notes (Signed)
PHARMACY - ADULT TOTAL PARENTERAL NUTRITION CONSULT NOTE   Pharmacy Consult for TPN Indication:recurrent SBO, prolonged ileus  Patient Measurements: Height: 5\' 9"  (175.3 cm) Weight: 139 lb 15.9 oz (63.5 kg) IBW/kg (Calculated) : 66.2 TPN AdjBW (KG): 54.2 Body mass index is 20.67 kg/m.   Insulin Requirements: 3 units sensitive SSI q6h/ 24 hrs  Current Nutrition: Starting clears 12/5  IVF: none Central access: PICC TPN start date: 12/15/15  ASSESSMENT                                                                                                          HPI: 82 y.o.femalewith medical history significant of CAD, dementia, DVT/PE, OSA, AFib on anticoagulation, recurrent SBO, CHF, NICM with mitral regurgitation, seizure disorder, recent stroke; presented with N/V, abd pain, and AMS. Recently admitted for SBO in October which improved with conservative mgmt. She was started on TPN on 12/03/15 and d/ced on 11/26.  Patient had limited oral intake since TPN was discontinued. S/p exp lap with lysis of adhesion and I&D of sacral wound on 12/01.  To start TPN back post-op on 12/2.  Significant events:  11/24: TPN stopped on 12/23 after family meeting with palliative care, however resume today/ per discussion with Dr Mahala Menghini.   11/25: Diet started per surgery 11/26: Surgery recommending to advance to full liquids for dinner if tolerates clears through lunch. TPN d/ced 12/1: s/p exp lap with lysis of adhesion and I&D of sacral wound 12/2: resume TPN; used electrolyte free clinimix since total daily required volume was <1L  (d/t critical Clinimix shortage and availability of products in the pharmacy) 12/3: changed TPN to E 5/15 since total daily required volume now >1L 12/5 MD states SBO opening up, starting CLD and OK to wean off TPN.  Today:   Glucose (goal <150): wnl; no hx DM  Electrolytes: Na, K, Mag, Phos, CorrCa WNL.  Mag at low end of normal range despite supplementation  yesterday.  Renal: AKI improving  LFTs: AST/ALT + TBili WNL (12/5)  TGs: wnl (11/21), 95 (12/2), 87 (12/4)  Prealbumin: 17 (11/21), 6.9 (12/2), 5.3 (12/4)  NUTRITIONAL GOALS                                                                                             RD recs (12/4): 1350-1600 Kcal/day, 70-80 g protein/day, fluid 1.5-1.7 L/day  Clinimix E 5/15 at a goal rate of 65 ml/hr + 20% fat emulsion at 16ml/hr to provide: 78 g/day protein, 1588 Kcal/day.  PLAN  Now:  Magnesium 2 gm IV x1  At 1800 today:  Wean off TPN today, decrease to 1/2 rate x2 hours (1600-1800) then d/c TPN at 1800.   D/C CBGs and sensitive SSI q6h.   D/C scheduled TPN labs, but recheck BMET, Mg, Phos with AM labs  +++ Clinimix is in critical shortage and pharmacy has very limited supply in stock.  If Appropriate, consider switching patient off TPN as soon as possible+++   Lynann Beaverhristine Arnetra Terris PharmD, BCPS Pager [phone] 12/18/2015 7:25 AM

## 2015-12-18 NOTE — Progress Notes (Signed)
PROGRESS NOTE                                                                                                                                                                                                             Patient Demographics:    Brandi Heath, is a 82 y.o. female, DOB - 01-10-1928, ZOX:[id number]  Admit date - 11/30/2015   Admitting Physician Therisa DoyneAnastassia Doutova, MD  Outpatient Primary MD for the patient is Merrilee SeashoreAnne Alexander, MD  LOS - 18  Outpatient Specialists: none   [facility] primary, Hospitalist now consulting for medical issues.  Chief Complaint  Patient presents with  . Emesis       Brief Narrative  82 year old female with history of A. 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  Patient having recurrent small bowel obstructions (4 episodes over the last few weeks). Surgery consulted and patient taken to or on 12/1For laparotomy and lysis of adhesions.  stepdown unit postop.   Subjective:   Still has A. fib with RVR . Episodes of hypotension. Denies abdominal pain. Reports passing gas.   Assessment  & Plan :    Principal Problem:   Recurrent SBO (small bowel obstruction) Multiple small bowel obstruction (fourth episode in the past 2 months). Underwent laparotomy with lysis of adhesions on 12/1. Tolerated well. High perioperative cardiac risk. TPN resumed postop. Denies any pain. He is improving. Started clears by surgery. Transfer to telemetry.  Active Problems:   Atrial fibrillation with RVR (HCC) Off Cardizem drip due to low blood pressure.. Continue low-dose amiodarone drip.Marland Kitchen. Possibly can add beta blocker once blood pressure is more stable. Continue IV heparin for anticoagulation (resume Eliquis once able to tolerate by mouth).   Hypotension Given 500 mL normal saline bolus  last evening for systolic blood pressure in the 80s. Continue to monitor. Unable to add. Controlling agent for A. Fib.   Sacral decubitus ulcer Debrided in the OR.  Seizure disorder On IV phenytoin.  Acute kidney injury Secondary to dehydration. Improving with fluids  Hypokalemia/hypomagnesemia Replenished.  Severe protein calorie malnutrition Started TPN postop. Started on clear liquid diet today.  Anemia of chronic disease At baseline. Transfuse if <7   Moderate  dementia         Code  Status : DO NOT RESUSCITATE  Family Communication  : Discussed with daughter on the phone on 12/3.  Disposition Plan  : Pending hospital course  Barriers For Discharge : Active symptoms  Consults  :   [facility]  Procedures  :  Laparotomy with lysis of adhesions on 12/1 Sacral ulcer debridement on 12/1.  DVT Prophylaxis  : IV heparin  Lab Results  Component Value Date   PLT 157 12/18/2015    Antibiotics  :    Anti-infectives    Start     Dose/Rate Route Frequency Ordered Stop   12/14/15 0600  clindamycin (CLEOCIN) IVPB 900 mg  Status:  Discontinued     900 mg 100 mL/hr over 30 Minutes Intravenous On call to O.R. 12/13/15 1305 12/14/15 1618   12/14/15 0600  gentamicin (GARAMYCIN) 310 mg in dextrose 5 % 100 mL IVPB  Status:  Discontinued     5 mg/kg  61.4 kg 107.8 mL/hr over 60 Minutes Intravenous On call to O.R. 12/13/15 1305 12/14/15 1618   12/03/15 0830  fluconazole (DIFLUCAN) tablet 150 mg     150 mg Oral  Once 12/03/15 0828 12/03/15 0859   12/02/15 2200  levofloxacin (LEVAQUIN) IVPB 500 mg  Status:  Discontinued     500 mg 100 mL/hr over 60 Minutes Intravenous Every 48 hours 11/30/15 2202 12/03/15 1036   11/30/15 2230  aztreonam (AZACTAM) 500 mg in dextrose 5 % 50 mL IVPB  Status:  Discontinued     500 mg 100 mL/hr over 30 Minutes Intravenous Every 8 hours 11/30/15 2203 12/01/15 0732   11/30/15 2145  levofloxacin (LEVAQUIN) IVPB 750 mg      750 mg 100 mL/hr over 90 Minutes Intravenous  Once 11/30/15 2130 11/30/15 2327   11/30/15 2145  aztreonam (AZACTAM) 2 g in dextrose 5 % 50 mL IVPB  Status:  Discontinued     2 g 100 mL/hr over 30 Minutes Intravenous  Once 11/30/15 2130 11/30/15 2203        Objective:   Vitals:   12/18/15 0730 12/18/15 0738 12/18/15 0800 12/18/15 0900  BP: (!) 94/45  (!) 92/48 (!) 82/52  Pulse: (!) 108  95 (!) 149  Resp: 18  (!) 30 16  Temp:  97.8 F (36.6 C)    TempSrc:  Axillary    SpO2: 100%  100% 100%  Weight:      Height:        Wt Readings from Last 3 Encounters:  12/18/15 63.5 kg (139 lb 15.9 oz)  11/14/15 66.2 kg (146 lb)  11/14/15 72.6 kg (160 lb)     Intake/Output Summary (Last 24 hours) at 12/18/15 1237 Last data filed at 12/18/15 0900  Gross per 24 hour  Intake           1082.2 ml  Output             1275 ml  Net           -192.8 ml     Physical Exam  BJY:NWGNFAO, not in distress HEENT: NG +, Dry mucosa, supple neck Chest: clear b/l, no added sounds CVS: S1 and S2 irregular, no murmurs GI: Midline laparotomy site clean, absent bowel sounds, Musculoskeletal: warm, no edema     Data Review:    CBC  Recent Labs Lab 12/15/15 0359 12/16/15 0336 12/17/15 0501 12/18/15 0438 12/18/15 0535  WBC 8.1 7.8 7.2 QUESTIONABLE RESULTS, RECOMMEND RECOLLECT TO VERIFY 6.1  HGB 11.5* 9.7* 9.7* QUESTIONABLE RESULTS,  RECOMMEND RECOLLECT TO VERIFY 8.9*  HCT 32.4* 27.2* 27.1* QUESTIONABLE RESULTS, RECOMMEND RECOLLECT TO VERIFY 25.2*  PLT 146* 139* 150 QUESTIONABLE RESULTS, RECOMMEND RECOLLECT TO VERIFY 157  MCV 82.0 82.2 81.6 QUESTIONABLE RESULTS, RECOMMEND RECOLLECT TO VERIFY 80.8  MCH 29.1 29.3 29.2 QUESTIONABLE RESULTS, RECOMMEND RECOLLECT TO VERIFY 28.5  MCHC 35.5 35.7 35.8 QUESTIONABLE RESULTS, RECOMMEND RECOLLECT TO VERIFY 35.3  RDW 14.7 15.3 15.6* QUESTIONABLE RESULTS, RECOMMEND RECOLLECT TO VERIFY 15.5  LYMPHSABS 1.1  --  0.9  --   --   MONOABS 0.4  --  0.5  --   --     EOSABS 0.0  --  0.1  --   --   BASOSABS 0.0  --  0.0  --   --     Chemistries   Recent Labs Lab 12/12/15 0123 12/13/15 1024 12/14/15 0429 12/14/15 1531 12/15/15 0359 12/16/15 0336 12/17/15 0501 12/18/15 0535  NA 139 141 134* 139 134* 132* 133* 135  K 3.6 2.8* 4.4 3.6 4.2 3.9 3.3* 3.8  CL 105 108 102  --  106 105 104 108  CO2 22 23 24   --  21* 20* 21* 22  GLUCOSE 86 75 250*  --  147* 140* 125* 131*  BUN 26* 25* 18  --  23* 22* 22* 26*  CREATININE 1.93* 1.50* 1.24*  --  1.28* 1.21* 1.14* 0.92  CALCIUM 8.8* 8.8* 8.4*  --  8.1* 8.3* 8.4* 8.5*  MG  --  1.6*  --   --  1.5* 1.9 1.8 1.8  AST 17  --   --   --  16 13* 12* 15  ALT 13*  --   --   --  12* 11* 11* 12*  ALKPHOS 57  --   --   --  53 54 54 46  BILITOT 1.1  --   --   --  1.4* 0.6 0.7 0.4   ------------------------------------------------------------------------------------------------------------------  Recent Labs  12/17/15 0501  TRIG 87    Lab Results  Component Value Date   HGBA1C 5.9 (H) 12/03/2015   ------------------------------------------------------------------------------------------------------------------ No results for input(s): TSH, T4TOTAL, T3FREE, THYROIDAB in the last 72 hours.  Invalid input(s): FREET3 ------------------------------------------------------------------------------------------------------------------ No results for input(s): VITAMINB12, FOLATE, FERRITIN, TIBC, IRON, RETICCTPCT in the last 72 hours.  Coagulation profile No results for input(s): INR, PROTIME in the last 168 hours.  No results for input(s): DDIMER in the last 72 hours.  Cardiac Enzymes No results for input(s): CKMB, TROPONINI, MYOGLOBIN in the last 168 hours.  Invalid input(s): CK ------------------------------------------------------------------------------------------------------------------ No results found for: BNP  Inpatient Medications  Scheduled Meds: . chlorhexidine  15 mL Mouth Rinse BID  .  feeding supplement  1 Container Oral BID BM  . feeding supplement (PRO-STAT SUGAR FREE 64)  30 mL Oral BID  . insulin aspart  0-9 Units Subcutaneous Q6H  . latanoprost  1 drop Both Eyes QHS  . levETIRAcetam  750 mg Intravenous Q12H  . lip balm  1 application Topical BID  . magnesium sulfate 1 - 4 g bolus IVPB  2 g Intravenous Once  . mouth rinse  15 mL Mouth Rinse q12n4p  . MUSCLE RUB   Topical QHS  . pantoprazole (PROTONIX) IV  40 mg Intravenous Q12H  . sodium chloride flush  10-40 mL Intracatheter Q12H  . sodium chloride flush  3 mL Intravenous Q12H   Continuous Infusions: . amiodarone 30 mg/hr (12/18/15 0800)  . Marland KitchenTPN (CLINIMIX-E) Adult 65 mL/hr (12/18/15 0800)   And  .  fat emulsion 240 mL (12/18/15 0800)  . heparin 1,300 Units/hr (12/18/15 0800)   PRN Meds:.magic mouthwash, menthol-cetylpyridinium, morphine injection, [DISCONTINUED] ondansetron **OR** ondansetron (ZOFRAN) IV, phenol, sodium chloride flush  Micro Results No results found for this or any previous visit (from the past 240 hour(s)).  Radiology Reports Ct Abdomen Pelvis Wo Contrast  Result Date: 11/30/2015 CLINICAL DATA:  Vomiting. Generalized abdominal pain and distention. EXAM: CT ABDOMEN AND PELVIS WITHOUT CONTRAST TECHNIQUE: Multidetector CT imaging of the abdomen and pelvis was performed following the standard protocol without IV contrast. COMPARISON:  10/01/2015 FINDINGS: Lower chest: There is cardiomegaly. Chronic opacities, likely scarring in the lung bases. No effusions. Densely calcified coronary arteries noted. There is a moderate-sized hiatal hernia which is fluid-filled. Hepatobiliary: Prior cholecystectomy.  No focal hepatic abnormality. Pancreas: Diffuse atrophy of the pancreas without visualized focal abnormality or ductal dilatation. Spleen: No focal abnormality.  Normal size. Adrenals/Urinary Tract: Small cyst in the midpole of the right kidney. No hydronephrosis. Adrenal gland is unremarkable. Urinary  bladder decompressed. Stomach/Bowel: The stomach is markedly distended with fluid. There are dilated small bowel loops in the right abdomen and extending into the pelvis. Transition point is in the mid pelvis on image 64, presumably related to adhesions. Findings compatible with distal small bowel obstruction. Colon is decompressed, grossly unremarkable. Vascular/Lymphatic: Prior stent graft repair of abdominal aortic aneurysm. The aneurysm sac size is 5.1 cm compared with 5.8 cm previously. No adenopathy. Reproductive: Prior hysterectomy.  No adnexal masses. Other: No free fluid or free air. Musculoskeletal: No acute bony abnormality or focal bone lesion. Diffuse degenerative disc and facet disease in the lumbar spine. IMPRESSION: Distal small bowel obstruction, likely related to adhesions. Moderate-sized fluid-filled hiatal hernia. Coronary artery disease, aortic atherosclerosis. Prior stent graft repair of AAA. Decreasing aneurysm sac size since prior study. Electronically Signed   By: Charlett Nose M.D.   On: 11/30/2015 20:25   Dg Abd 1 View  Result Date: 12/11/2015 CLINICAL DATA:  Abdominal pain and distention for several days EXAM: ABDOMEN - 1 VIEW COMPARISON:  11/17/2015 FINDINGS: There again noted changes consistent with aortic stent graft placement. The overall appearance is stable. IVC filter is noted as well but somewhat obscured by the stent graft. Left external iliac artery stent is noted as well. Scattered large and small bowel gas is noted. Dilated small bowel is noted in the mid abdomen on the right. There is a relative paucity of bowel gas on the left which may be related to distention of the stomach. IMPRESSION: Changes consistent with at least partial small bowel obstruction. Electronically Signed   By: Alcide Clever M.D.   On: 12/11/2015 12:00   Dg Abd 1 View  Result Date: 12/01/2015 CLINICAL DATA:  Small bowel obstruction EXAM: ABDOMEN - 1 VIEW COMPARISON:  11/30/2015 FINDINGS: Dilated  small bowel loops again noted right abdomen and pelvis compatible with small bowel obstruction, unchanged. NG tube remains in the stomach. Stent graft again noted. IVC filter noted. No free air or visible organomegaly. IMPRESSION: Continued small bowel dilatation compatible with small bowel obstruction. No real change. Electronically Signed   By: Charlett Nose M.D.   On: 12/01/2015 07:09   Dg Chest Port 1 View  Result Date: 12/02/2015 CLINICAL DATA:  Post PICC line insertion EXAM: PORTABLE CHEST 1 VIEW COMPARISON:  November 14, 2015 FINDINGS: The heart, hila, and mediastinum are unchanged. The right PICC line terminates near the cavoatrial junction. The distal tip of the NG tube is not assessed. There  is tubing coiled back on itself projected over the midline of the upper chest, likely the portion of the NG tube outside dictation. High attenuation material in the lung bases is stable, possibly aspirated material. No other interval changes or acute abnormalities. IMPRESSION: 1. The right PICC line terminates near the caval atrial junction. Electronically Signed   By: Gerome Sam III M.D   On: 12/02/2015 18:49   Dg Abd 2 Views  Result Date: 12/07/2015 CLINICAL DATA:  Small bowel obstruction. EXAM: ABDOMEN - 2 VIEW COMPARISON:  Single-view of the abdomen 12/02/2015 and 12/06/2015. FINDINGS: NG tube remains in place. Mild gaseous distention of a loop of distal small bowel at 3.2 cm is unchanged. No free intraperitoneal air is identified. Cholecystectomy clips, aneurysm coils and aortoiliac stent graft early again seen. IMPRESSION: No marked change in mild gas distention of small bowel since the most recent examination compatible with small-bowel obstruction. Electronically Signed   By: Drusilla Kanner M.D.   On: 12/07/2015 10:12   Dg Abd Portable 1v  Result Date: 12/06/2015 CLINICAL DATA:  Follow up partial small bowel obstruction. EXAM: PORTABLE ABDOMEN - 1 VIEW COMPARISON:  12/02/2015 dating back to  10/01/2015, including CT abdomen and pelvis 11/30/2015 and 10/01/2015. FINDINGS: Nasogastric tube tip projects over the expected location of the midbody of a J-shaped stomach, unchanged. Several dilated loops of small bowel persist in the abdomen and pelvis, though these loops are less distended than on the examination 4 days ago. Gas and stool is present in the decompressed colon. No suggestion of free air on the supine image. Prior aorto bi-iliac stent grafting for the previously identified abdominal aortic aneurysm. IVC filter in the lower IVC. Prior embolization of the right internal iliac artery. IMPRESSION: Persistent partial small bowel obstruction which is minimally improved since examination 4 days ago. Electronically Signed   By: Hulan Saas M.D.   On: 12/06/2015 10:56   Dg Abd Portable 1v  Result Date: 12/02/2015 CLINICAL DATA:  Small bowel obstruction. EXAM: PORTABLE ABDOMEN - 1 VIEW COMPARISON:  December 01, 2015 FINDINGS: A small bowel obstruction persists. No contrast is identified on today's study. However, only minimal contrast is seen in stomach previously. NG tube terminates in the left upper quadrant. IMPRESSION: Persistent small bowel obstruction. Electronically Signed   By: Gerome Sam III M.D   On: 12/02/2015 16:01   Dg Abd Portable 1v-small Bowel Obstruction Protocol-initial, 8 Hr Delay  Result Date: 12/01/2015 CLINICAL DATA:  Small bowel obstruction. EXAM: PORTABLE ABDOMEN - 1 VIEW COMPARISON:  December 01, 2015 FINDINGS: The NG tube is stable. A small amount of contrast is seen in the stomach, new in the interval. No other contrast seen throughout the abdomen. Persistently dilated small bowel loops in the right abdomen consistent with small bowel obstruction. No other change. IMPRESSION: Small bowel obstruction. Electronically Signed   By: Gerome Sam III M.D   On: 12/01/2015 22:49   Dg Abd Portable 1v-small Bowel Protocol-position Verification  Result Date:  12/01/2015 CLINICAL DATA:  Followup small bowel obstruction. Nasogastric tube placement. EXAM: PORTABLE ABDOMEN - 1 VIEW COMPARISON:  12/01/2015 FINDINGS: Nasogastric tube is seen with tip in the body the stomach. Moderately dilated small bowel loops in the right abdomen show no significant change compared to recent study. Aortoiliac stent graft again seen as well as embolization coils adjacent to the right iliac limb of the graft. IVC filter again noted. IMPRESSION: Nasogastric tube tip remains in the body the stomach. No significant change in dilated  small bowel loops, consistent with small bowel obstruction. Electronically Signed   By: Myles Rosenthal M.D.   On: 12/01/2015 12:49   Dg Abd Portable 1v  Result Date: 11/30/2015 CLINICAL DATA:  Nasogastric tube placement EXAM: PORTABLE ABDOMEN - 1 VIEW COMPARISON:  11/30/2015 CT FINDINGS: The nasogastric tube extends well into the stomach with tip in the region of the distal gastric body. IMPRESSION: Nasogastric tube extends into the stomach. Electronically Signed   By: Ellery Plunk M.D.   On: 11/30/2015 22:55    Time Spent in minutes  35   Eddie North M.D on 12/18/2015 at 12:37 PM  Between 7am to 7pm - Pager - [phone]  After 7pm go to [email or web address] - password [facility]  [facility] -  Office  [phone]

## 2015-12-18 NOTE — Progress Notes (Signed)
Nutrition Follow-up  DOCUMENTATION CODES:   Severe malnutrition in context of acute illness/injury, Underweight  INTERVENTION:  - D/c TPN per Pharmacy tonight at 1800. - Advancement to CLD per Dr. Hassell Done. - Will order Boost Breeze BID, each supplement provides 250 kcal and 9 grams of protein - Will order 30 mL Prostat BID, each supplement provides 100 kcal and 15 grams of protein--please mix in pt's beverage of choice. - RD will follow-up 12/6.  NUTRITION DIAGNOSIS:   Inadequate oral intake related to inability to eat as evidenced by NPO status. -ongoing  GOAL:   Patient will meet greater than or equal to 90% of their needs -met with TPN at goal rate.  MONITOR:   Labs, Weight trends, Skin, I & O's, Other (Comment) (TPN )  ASSESSMENT:   82 y.o. female with medical history significant of  CAD, dementia, DVT, OSA, D, atrial fibrillation on anticoagulation, recurrent small bowel obstruction, CHF nonischemic cardiomyopathy with mitral regurgitation, seizure disorder, recent stroke, As with abdominal pain, vomiting and altered mental status.  12/5 No family/visitors present at time of RD visit and pt sleeping at that time. NGT now out. Pt remains NPO and TPN is currently at goal rate: Clinimix E 5/15 @ 65 mL/hr with 20% ILE @ 10 mL/hr which is providing 78 grams of protein and 1588 kcal. Dr. Earlie Server note from this AM indicates plan to start CLD today.  Talked with Pharmacist who reports plan to d/c TPN tonight at 1800 d/t national shortage and diet advancement; will order oral nutrition supplements as outlined above to assist in meeting estimated protein needs. Weight stable from yesterday. Estimated nutrition needs remain the same at this time; will continue to monitor.   Medications reviewed; sliding scale Novolog, 750 mg IV Keppra BID, 2 g IV Mg sulfate x1 dose today, PRN IV Zofran, 40 mg IV Protonix BID, 30 mEq IV KCL x1 dose yesterday. Labs reviewed; CBGs: 132 mg/dL, BUN: 26 mg/dL,  Ca: 8.5 mg/dL, triglycerides: 87 mg/dL yesterday AM.   Drips: Heparin @ 1300 units/hr, Amiodarone @ 30 mg/hr.    12/4 - Pt remains NPO and is currently receiving Clinimix E 5/15 @ 50 mL/hr with 20% ILE @ 10 mL/hr.  - Per Pharmacy note this AM, plan to advance to goal TPN rate today at 1800: Clinimix E 5/15 @ 65 mL/hr with 20% ILE @ 10 mL/hr.  - At goal rate, this regimen will provide 78 grams of protein and 1588 kcal. - Weight stable from yesterday and -3.4 kg from admission.  - NGT in place to suction with documented 135cc output this shift.  - Pt denies any abdominal pain or nausea at time of RD visit this AM.  Drips: Heparin @ 1150 units/hr, Amiodarone @ 30 mg/hr.    12/3 12/1: s/p EXPLORATORY LAPAROTOMY POSSIBLE SMALL BOWEL RESECTION WITH COLOSTOMY DEBRIDMENT OF SACRAL DECUBITUS ULCER, skin subcutaneoustissue and fascia 6 x 6 cm  Patient receiving Clinimix 5/15 @ 30 ml/hr and ILE @ 10 ml/hr, providing 991 kcal and 36 g protein. Per Pharmacy note, pt will switch to electrolyte Clinimix today.   Diet Order:  TPN (CLINIMIX-E) Adult Diet clear liquid Room service appropriate? Yes; Fluid consistency: Thin  Skin:  Wound (see comment) (Unstageable, full thickness sacral pressure injury)  Last BM:  11/30  Height:   Ht Readings from Last 1 Encounters:  12/01/15 5' 9"  (1.753 m)    Weight:   Wt Readings from Last 1 Encounters:  12/18/15 139 lb 15.9 oz (63.5  kg)    Ideal Body Weight:  65.9 kg  BMI:  Body mass index is 20.67 kg/m.  Estimated Nutritional Needs:   Kcal:  1350-1600  Protein:  70-80g  Fluid:  1.5-1.7L/day  EDUCATION NEEDS:   No education needs identified at this time    Jarome Matin, MS, RD, LDN, CNSC Inpatient Clinical Dietitian Pager # [phone] After hours/weekend pager # [phone]

## 2015-12-18 NOTE — Progress Notes (Signed)
Patient ID: Brandi Heath, female   DOB: [birth year], 82 y.o.   MRN: [id number] [facility] Progress Note:   4 Days Post-Op  Subjective: Mental status is arouseable Objective: Vital signs in last 24 hours: Temp:  [97.7 F (36.5 C)-99.1 F (37.3 C)] 97.8 F (36.6 C) (12/05 0738) Pulse Rate:  [47-128] 108 (12/05 0730) Resp:  [11-33] 18 (12/05 0730) BP: (85-117)/(45-85) 94/45 (12/05 0730) SpO2:  [98 %-100 %] 100 % (12/05 0730) Weight:  [63.5 kg (139 lb 15.9 oz)] 63.5 kg (139 lb 15.9 oz) (12/05 0441)  Intake/Output from previous day: 12/04 0701 - 12/05 0700 In: 1506.7 [I.V.:1399.7; IV Piggyback:107] Out: 1310 [Urine:1310] Intake/Output this shift: Total I/O In: 104.7 [I.V.:104.7] Out: 275 [Urine:275]  Physical Exam: Work of breathing is not labored;  Dressing covering wound appears uncomplicated  Lab Results:  Results for orders placed or performed during the hospital encounter of 11/30/15 (from the past 48 hour(s))  Glucose, capillary     Status: Abnormal   Collection Time: 12/16/15 11:22 AM  Result Value Ref Range   Glucose-Capillary 126 (H) 65 - 99 mg/dL   Comment 1 Notify RN    Comment 2 Document in Chart   Glucose, capillary     Status: Abnormal   Collection Time: 12/16/15  6:05 PM  Result Value Ref Range   Glucose-Capillary 141 (H) 65 - 99 mg/dL   Comment 1 Notify RN    Comment 2 Document in Chart   Heparin level (unfractionated)     Status: Abnormal   Collection Time: 12/16/15  6:30 PM  Result Value Ref Range   Heparin Unfractionated <0.10 (L) 0.30 - 0.70 IU/mL    Comment:        IF HEPARIN RESULTS ARE BELOW EXPECTED VALUES, AND PATIENT DOSAGE HAS BEEN CONFIRMED, SUGGEST FOLLOW UP TESTING OF ANTITHROMBIN III LEVELS.   Glucose, capillary     Status: Abnormal   Collection Time: 12/16/15 11:44 PM  Result Value Ref Range   Glucose-Capillary 129 (H) 65 - 99 mg/dL   Comment 1 Notify RN   CBC     Status: Abnormal   Collection Time: 12/17/15  5:01 AM   Result Value Ref Range   WBC 7.2 4.0 - 10.5 K/uL   RBC 3.32 (L) 3.87 - 5.11 MIL/uL   Hemoglobin 9.7 (L) 12.0 - 15.0 g/dL   HCT 27.1 (L) 36.0 - 46.0 %   MCV 81.6 78.0 - 100.0 fL   MCH 29.2 26.0 - 34.0 pg   MCHC 35.8 30.0 - 36.0 g/dL   RDW 15.6 (H) 11.5 - 15.5 %   Platelets 150 150 - 400 K/uL  Comprehensive metabolic panel     Status: Abnormal   Collection Time: 12/17/15  5:01 AM  Result Value Ref Range   Sodium 133 (L) 135 - 145 mmol/L   Potassium 3.3 (L) 3.5 - 5.1 mmol/L   Chloride 104 101 - 111 mmol/L   CO2 21 (L) 22 - 32 mmol/L   Glucose, Bld 125 (H) 65 - 99 mg/dL   BUN 22 (H) 6 - 20 mg/dL   Creatinine, Ser 1.14 (H) 0.44 - 1.00 mg/dL   Calcium 8.4 (L) 8.9 - 10.3 mg/dL   Total Protein 5.5 (L) 6.5 - 8.1 g/dL   Albumin 2.4 (L) 3.5 - 5.0 g/dL   AST 12 (L) 15 - 41 U/L   ALT 11 (L) 14 - 54 U/L   Alkaline Phosphatase 54 38 - 126 U/L   Total Bilirubin 0.7  0.3 - 1.2 mg/dL   GFR calc non Af Amer 42 (L) >60 mL/min   GFR calc Af Amer 49 (L) >60 mL/min    Comment: (NOTE) The eGFR has been calculated using the CKD EPI equation. This calculation has not been validated in all clinical situations. eGFR's persistently <60 mL/min signify possible Chronic Kidney Disease.    Anion gap 8 5 - 15  Magnesium     Status: None   Collection Time: 12/17/15  5:01 AM  Result Value Ref Range   Magnesium 1.8 1.7 - 2.4 mg/dL  Phosphorus     Status: None   Collection Time: 12/17/15  5:01 AM  Result Value Ref Range   Phosphorus 3.4 2.5 - 4.6 mg/dL  Differential     Status: None   Collection Time: 12/17/15  5:01 AM  Result Value Ref Range   Neutrophils Relative % 78 %   Neutro Abs 5.6 1.7 - 7.7 K/uL   Lymphocytes Relative 13 %   Lymphs Abs 0.9 0.7 - 4.0 K/uL   Monocytes Relative 7 %   Monocytes Absolute 0.5 0.1 - 1.0 K/uL   Eosinophils Relative 2 %   Eosinophils Absolute 0.1 0.0 - 0.7 K/uL   Basophils Relative 0 %   Basophils Absolute 0.0 0.0 - 0.1 K/uL  Triglycerides     Status: None    Collection Time: 12/17/15  5:01 AM  Result Value Ref Range   Triglycerides 87 <150 mg/dL    Comment: Performed at [facility]  Prealbumin     Status: Abnormal   Collection Time: 12/17/15  5:01 AM  Result Value Ref Range   Prealbumin 5.3 (L) 18 - 38 mg/dL    Comment: Performed at [facility]  Heparin level (unfractionated)     Status: None   Collection Time: 12/17/15  5:01 AM  Result Value Ref Range   Heparin Unfractionated 0.35 0.30 - 0.70 IU/mL    Comment:        IF HEPARIN RESULTS ARE BELOW EXPECTED VALUES, AND PATIENT DOSAGE HAS BEEN CONFIRMED, SUGGEST FOLLOW UP TESTING OF ANTITHROMBIN III LEVELS.   Glucose, capillary     Status: Abnormal   Collection Time: 12/17/15  6:17 AM  Result Value Ref Range   Glucose-Capillary 141 (H) 65 - 99 mg/dL  Glucose, capillary     Status: Abnormal   Collection Time: 12/17/15 12:12 PM  Result Value Ref Range   Glucose-Capillary 124 (H) 65 - 99 mg/dL   Comment 1 Notify RN    Comment 2 Document in Chart   Heparin level (unfractionated)     Status: Abnormal   Collection Time: 12/17/15  2:00 PM  Result Value Ref Range   Heparin Unfractionated 0.26 (L) 0.30 - 0.70 IU/mL    Comment:        IF HEPARIN RESULTS ARE BELOW EXPECTED VALUES, AND PATIENT DOSAGE HAS BEEN CONFIRMED, SUGGEST FOLLOW UP TESTING OF ANTITHROMBIN III LEVELS.   Glucose, capillary     Status: Abnormal   Collection Time: 12/17/15  5:24 PM  Result Value Ref Range   Glucose-Capillary 104 (H) 65 - 99 mg/dL   Comment 1 Notify RN    Comment 2 Document in Chart   Heparin level (unfractionated)     Status: Abnormal   Collection Time: 12/17/15 10:50 PM  Result Value Ref Range   Heparin Unfractionated 0.29 (L) 0.30 - 0.70 IU/mL    Comment:        IF HEPARIN RESULTS ARE BELOW  EXPECTED VALUES, AND PATIENT DOSAGE HAS BEEN CONFIRMED, SUGGEST FOLLOW UP TESTING OF ANTITHROMBIN III LEVELS.   Glucose, capillary     Status: Abnormal   Collection Time: 12/17/15 11:01  PM  Result Value Ref Range   Glucose-Capillary 121 (H) 65 - 99 mg/dL  CBC     Status: None   Collection Time: 12/18/15  4:38 AM  Result Value Ref Range   WBC  4.0 - 10.5 K/uL    QUESTIONABLE RESULTS, RECOMMEND RECOLLECT TO VERIFY    Comment: SEE RECOLLECT CORRECTED ON 12/05 AT 1610: PREVIOUSLY REPORTED AS 5.8    RBC  3.87 - 5.11 MIL/uL    QUESTIONABLE RESULTS, RECOMMEND RECOLLECT TO VERIFY    Comment: SEE RECOLLECT CORRECTED ON 12/05 AT 9604: PREVIOUSLY REPORTED AS 3.03    Hemoglobin  12.0 - 15.0 g/dL    QUESTIONABLE RESULTS, RECOMMEND RECOLLECT TO VERIFY    Comment: SEE RECOLLECT CORRECTED ON 12/05 AT 5409: PREVIOUSLY REPORTED AS 9.0    HCT  36.0 - 46.0 %    QUESTIONABLE RESULTS, RECOMMEND RECOLLECT TO VERIFY    Comment: SEE RECOLLECT CORRECTED ON 12/05 AT 8119: PREVIOUSLY REPORTED AS 25.0    MCV  78.0 - 100.0 fL    QUESTIONABLE RESULTS, RECOMMEND RECOLLECT TO VERIFY    Comment: SEE RECOLLECT CORRECTED ON 12/05 AT 1478: PREVIOUSLY REPORTED AS 82.5    MCH  26.0 - 34.0 pg    QUESTIONABLE RESULTS, RECOMMEND RECOLLECT TO VERIFY    Comment: SEE RECOLLECT CORRECTED ON 12/05 AT 2956: PREVIOUSLY REPORTED AS 29.7    MCHC  30.0 - 36.0 g/dL    QUESTIONABLE RESULTS, RECOMMEND RECOLLECT TO VERIFY    Comment: SEE RECOLLECT CORRECTED ON 12/05 AT 2130: PREVIOUSLY REPORTED AS 36.0    RDW  11.5 - 15.5 %    QUESTIONABLE RESULTS, RECOMMEND RECOLLECT TO VERIFY    Comment: SEE RECOLLECT CORRECTED ON 12/05 AT 8657: PREVIOUSLY REPORTED AS 15.9    Platelets  150 - 400 K/uL    QUESTIONABLE RESULTS, RECOMMEND RECOLLECT TO VERIFY    Comment: SEE RECOLLECT CORRECTED ON 12/05 AT 8469: PREVIOUSLY REPORTED AS 165   Comprehensive metabolic panel     Status: Abnormal   Collection Time: 12/18/15  5:35 AM  Result Value Ref Range   Sodium 135 135 - 145 mmol/L   Potassium 3.8 3.5 - 5.1 mmol/L   Chloride 108 101 - 111 mmol/L   CO2 22 22 - 32 mmol/L   Glucose, Bld 131 (H) 65 - 99 mg/dL   BUN 26  (H) 6 - 20 mg/dL   Creatinine, Ser 0.92 0.44 - 1.00 mg/dL   Calcium 8.5 (L) 8.9 - 10.3 mg/dL   Total Protein 5.2 (L) 6.5 - 8.1 g/dL   Albumin 2.3 (L) 3.5 - 5.0 g/dL   AST 15 15 - 41 U/L   ALT 12 (L) 14 - 54 U/L   Alkaline Phosphatase 46 38 - 126 U/L   Total Bilirubin 0.4 0.3 - 1.2 mg/dL   GFR calc non Af Amer 55 (L) >60 mL/min   GFR calc Af Amer >60 >60 mL/min    Comment: (NOTE) The eGFR has been calculated using the CKD EPI equation. This calculation has not been validated in all clinical situations. eGFR's persistently <60 mL/min signify possible Chronic Kidney Disease.    Anion gap 5 5 - 15  Magnesium     Status: None   Collection Time: 12/18/15  5:35 AM  Result Value Ref Range   Magnesium  1.8 1.7 - 2.4 mg/dL  Phosphorus     Status: None   Collection Time: 12/18/15  5:35 AM  Result Value Ref Range   Phosphorus 3.4 2.5 - 4.6 mg/dL  CBC     Status: Abnormal   Collection Time: 12/18/15  5:35 AM  Result Value Ref Range   WBC 6.1 4.0 - 10.5 K/uL   RBC 3.12 (L) 3.87 - 5.11 MIL/uL   Hemoglobin 8.9 (L) 12.0 - 15.0 g/dL   HCT 25.2 (L) 36.0 - 46.0 %   MCV 80.8 78.0 - 100.0 fL   MCH 28.5 26.0 - 34.0 pg   MCHC 35.3 30.0 - 36.0 g/dL   RDW 15.5 11.5 - 15.5 %   Platelets 157 150 - 400 K/uL  Glucose, capillary     Status: Abnormal   Collection Time: 12/18/15  6:29 AM  Result Value Ref Range   Glucose-Capillary 132 (H) 65 - 99 mg/dL   Comment 1 Notify RN    Comment 2 Document in Chart     Radiology/Results: No results found.  Anti-infectives: Anti-infectives    Start     Dose/Rate Route Frequency Ordered Stop   12/14/15 0600  clindamycin (CLEOCIN) IVPB 900 mg  Status:  Discontinued     900 mg 100 mL/hr over 30 Minutes Intravenous On call to O.R. 12/13/15 1305 12/14/15 1618   12/14/15 0600  gentamicin (GARAMYCIN) 310 mg in dextrose 5 % 100 mL IVPB  Status:  Discontinued     5 mg/kg  61.4 kg 107.8 mL/hr over 60 Minutes Intravenous On call to O.R. 12/13/15 1305 12/14/15 1618    12/03/15 0830  fluconazole (DIFLUCAN) tablet 150 mg     150 mg Oral  Once 12/03/15 0828 12/03/15 0859   12/02/15 2200  levofloxacin (LEVAQUIN) IVPB 500 mg  Status:  Discontinued     500 mg 100 mL/hr over 60 Minutes Intravenous Every 48 hours 11/30/15 2202 12/03/15 1036   11/30/15 2230  aztreonam (AZACTAM) 500 mg in dextrose 5 % 50 mL IVPB  Status:  Discontinued     500 mg 100 mL/hr over 30 Minutes Intravenous Every 8 hours 11/30/15 2203 12/01/15 0732   11/30/15 2145  levofloxacin (LEVAQUIN) IVPB 750 mg     750 mg 100 mL/hr over 90 Minutes Intravenous  Once 11/30/15 2130 11/30/15 2327   11/30/15 2145  aztreonam (AZACTAM) 2 g in dextrose 5 % 50 mL IVPB  Status:  Discontinued     2 g 100 mL/hr over 30 Minutes Intravenous  Once 11/30/15 2130 11/30/15 2203      Assessment/Plan: Problem List: Patient Active Problem List   Diagnosis Date Noted  . Hypotension   . S/P exploratory laparotomy 12/14/2015  . Hypokalemia   . Hypomagnesemia   . Chronic anticoagulation 12/06/2015  . Encounter for hospice care discussion   . Palliative care encounter   . Goals of care, counseling/discussion   . Protein-calorie malnutrition, severe 12/03/2015  . Pressure injury of skin 12/01/2015  . Acute renal failure (ARF) ([place]) 11/30/2015  . Dehydration 11/30/2015  . Acute kidney injury ([place]) 11/14/2015  . Non-ischemic cardiomyopathy ([place]) 11/11/2015  . MR (mitral regurgitation) 11/11/2015  . Acute respiratory failure with hypoxia ([place]) 10/17/2015  . Slurred speech   . Left sided numbness   . Acute on chronic diastolic heart failure ([place]) 10/04/2015  . Normocytic anemia 10/04/2015  . Thrombocytopenia ([place]) 10/04/2015  . TIA (transient ischemic attack) 10/04/2015  . Cough 10/02/2015  . SBO (small bowel obstruction)  10/01/2015  . Chronic CHF (congestive heart failure) ([place]) 09/29/2015  . Venous insufficiency (chronic) (peripheral) 09/29/2015  . Vitamin B12 deficiency 09/29/2015  . Anemia, chronic  disease 09/29/2015  . Enterococcus UTI 09/02/2015  . GERD (gastroesophageal reflux disease) 09/02/2015  . Adynamic ileus ([place]) 06/11/2015  . CHF, acute on chronic ([place]) 06/11/2015  . Personal history of DVT (deep vein thrombosis) 06/11/2015  . Non-intractable vomiting with nausea 06/03/2015  . Abdominal aortic aneurysm without rupture ([place]) 05/31/2015  . Jaw pain 05/27/2015  . E. coli UTI 04/22/2015  . Prerenal acute renal failure ([place]) 04/22/2015  . History of CVA (cerebrovascular accident) 04/22/2015  . AF (atrial fibrillation) ([place]) 04/22/2015  . Polyneuropathy ([place]) 04/22/2015  . Edema 01/25/2015  . Atrial fibrillation with rapid ventricular response ([place]) 01/20/2015  . Acute encephalopathy 01/20/2015  . UTI (urinary tract infection) 01/20/2015  . Tracheobronchomalacia 01/20/2015  . Personal history of PE (pulmonary embolism) 01/20/2015  . Depression 01/20/2015  . OSA (obstructive sleep apnea)   . Dementia with behavioral disturbance   . Seizures ([place])   . Hyperlipidemia   . Hypertensive heart disease with CHF (congestive heart failure) ([place])   . Clotting disorder ([place])   . Cerebrovascular accident (CVA) due to thrombosis ([place])     Transfer to the floor;  Begin clear liquids 4 Days Post-Op    LOS: 18 days   Matt B. Hassell Done, MD, [facility] [phone] beeper [phone]  12/18/2015 9:14 AM

## 2015-12-18 NOTE — Progress Notes (Addendum)
Patient Name: Brandi Heath Date of Encounter: 12/18/2015  Primary Cardiologist: Dr. Hanley Heath [facility])  Hospital Problem List     Principal Problem:   SBO (small bowel obstruction) Active Problems:   OSA (obstructive sleep apnea)   Dementia with behavioral disturbance   Hypertensive heart disease with CHF (congestive heart failure) (HCC)   Atrial fibrillation with rapid ventricular response (HCC)   Acute encephalopathy   UTI (urinary tract infection)   History of CVA (cerebrovascular accident)   GERD (gastroesophageal reflux disease)   Anemia, chronic disease   Non-ischemic cardiomyopathy (HCC)   MR (mitral regurgitation)   Acute kidney injury (HCC)   Acute renal failure (ARF) (HCC)   Dehydration   Pressure injury of skin   Protein-calorie malnutrition, severe   Palliative care encounter   Goals of care, counseling/discussion   Chronic anticoagulation   Encounter for hospice care discussion   Hypokalemia   Hypomagnesemia   S/P exploratory laparotomy   Hypotension    Subjective   Denies any CP, SOB, palpitations or acute complaints.  Inpatient Medications    . chlorhexidine  15 mL Mouth Rinse BID  . insulin aspart  0-9 Units Subcutaneous Q6H  . latanoprost  1 drop Both Eyes QHS  . levETIRAcetam  750 mg Intravenous Q12H  . lip balm  1 application Topical BID  . magnesium sulfate 1 - 4 g bolus IVPB  2 g Intravenous Once  . mouth rinse  15 mL Mouth Rinse q12n4p  . MUSCLE RUB   Topical QHS  . pantoprazole (PROTONIX) IV  40 mg Intravenous Q12H  . sodium chloride flush  10-40 mL Intracatheter Q12H  . sodium chloride flush  3 mL Intravenous Q12H    Vital Signs    Vitals:   12/18/15 0730 12/18/15 0738 12/18/15 0800 12/18/15 0900  BP: (!) 94/45  (!) 92/48 (!) 82/52  Pulse: (!) 108  95 (!) 149  Resp: 18  (!) 30 16  Temp:  97.8 F (36.6 C)    TempSrc:  Axillary    SpO2: 100%  100% 100%  Weight:      Height:        Intake/Output Summary (Last 24  hours) at 12/18/15 1005 Last data filed at 12/18/15 0900  Gross per 24 hour  Intake           1346.8 ml  Output             1425 ml  Net            -78.2 ml   Filed Weights   12/16/15 0500 12/17/15 0400 12/18/15 0441  Weight: 63.5 kg (139 lb 15.9 oz) 62.8 kg (138 lb 7.2 oz) 63.5 kg (139 lb 15.9 oz)    Physical Exam    General: Chronically ill frail AAF in no acute distress. Laying on her side in bed without acute complaint HEENT: Normocephalic, atraumatic, sclera non-icteric, no xanthomas, nares are without discharge. Neck: JVP not elevated. Lungs: Clear bilaterally to auscultation without wheezes, rales, or rhonchi. Breathing is unlabored. Cardiac: Irregularly irregular, rate borderline elevated S1 S2 without murmurs, rubs, or gallops.  Abdomen: Soft, non-tender. Extremities: No clubbing or cyanosis. Trace pedal edema. Distal pulses in tact. Skin: Warm and dry, no significant rash. Neuro: Alert - answers Brandi Heath/Brandi Heath to orientation questions, knows its 2017. Sensation in tact. Follows commands. Psych:  Flattened affect.  Labs    CBC  Recent Labs  12/17/15 0501 12/18/15 0438 12/18/15 0535  WBC 7.2  QUESTIONABLE RESULTS, RECOMMEND RECOLLECT TO VERIFY 6.1  NEUTROABS 5.6  --   --   HGB 9.7* QUESTIONABLE RESULTS, RECOMMEND RECOLLECT TO VERIFY 8.9*  HCT 27.1* QUESTIONABLE RESULTS, RECOMMEND RECOLLECT TO VERIFY 25.2*  MCV 81.6 QUESTIONABLE RESULTS, RECOMMEND RECOLLECT TO VERIFY 80.8  PLT 150 QUESTIONABLE RESULTS, RECOMMEND RECOLLECT TO VERIFY 157   Basic Metabolic Panel  Recent Labs  12/17/15 0501 12/18/15 0535  NA 133* 135  K 3.3* 3.8  CL 104 108  CO2 21* 22  GLUCOSE 125* 131*  BUN 22* 26*  CREATININE 1.14* 0.92  CALCIUM 8.4* 8.5*  MG 1.8 1.8  PHOS 3.4 3.4   Liver Function Tests  Recent Labs  12/17/15 0501 12/18/15 0535  AST 12* 15  ALT 11* 12*  ALKPHOS 54 46  BILITOT 0.7 0.4  PROT 5.5* 5.2*  ALBUMIN 2.4* 2.3*   No results for input(s): LIPASE,  AMYLASE in the last 72 hours. Cardiac Enzymes No results for input(s): CKTOTAL, CKMB, CKMBINDEX, TROPONINI in the last 72 hours. BNP Invalid input(s): POCBNP D-Dimer No results for input(s): DDIMER in the last 72 hours. Hemoglobin A1C No results for input(s): HGBA1C in the last 72 hours. Fasting Lipid Panel  Recent Labs  12/17/15 0501  TRIG 87   Thyroid Function Tests No results for input(s): TSH, T4TOTAL, T3FREE, THYROIDAB in the last 72 hours.  Invalid input(s): FREET3  Telemetry    Atrial fib occ PVCs HR variable, 80s-110s  Radiology    Ct Abdomen Pelvis Wo Contrast  Result Date: 11/30/2015 CLINICAL DATA:  Vomiting. Generalized abdominal pain and distention. EXAM: CT ABDOMEN AND PELVIS WITHOUT CONTRAST TECHNIQUE: Multidetector CT imaging of the abdomen and pelvis was performed following the standard protocol without IV contrast. COMPARISON:  10/01/2015 FINDINGS: Lower chest: There is cardiomegaly. Chronic opacities, likely scarring in the lung bases. No effusions. Densely calcified coronary arteries noted. There is a moderate-sized hiatal hernia which is fluid-filled. Hepatobiliary: Prior cholecystectomy.  No focal hepatic abnormality. Pancreas: Diffuse atrophy of the pancreas without visualized focal abnormality or ductal dilatation. Spleen: No focal abnormality.  Normal size. Adrenals/Urinary Tract: Small cyst in the midpole of the right kidney. No hydronephrosis. Adrenal gland is unremarkable. Urinary bladder decompressed. Stomach/Bowel: The stomach is markedly distended with fluid. There are dilated small bowel loops in the right abdomen and extending into the pelvis. Transition point is in the mid pelvis on image 64, presumably related to adhesions. Findings compatible with distal small bowel obstruction. Colon is decompressed, grossly unremarkable. Vascular/Lymphatic: Prior stent graft repair of abdominal aortic aneurysm. The aneurysm sac size is 5.1 cm compared with 5.8 cm  previously. No adenopathy. Reproductive: Prior hysterectomy.  No adnexal masses. Other: No free fluid or free air. Musculoskeletal: No acute bony abnormality or focal bone lesion. Diffuse degenerative disc and facet disease in the lumbar spine. IMPRESSION: Distal small bowel obstruction, likely related to adhesions. Moderate-sized fluid-filled hiatal hernia. Coronary artery disease, aortic atherosclerosis. Prior stent graft repair of AAA. Decreasing aneurysm sac size since prior study. Electronically Signed   By: Charlett Nose M.D.   On: 11/30/2015 20:25   Dg Abd 1 View  Result Date: 12/11/2015 CLINICAL DATA:  Abdominal pain and distention for several days EXAM: ABDOMEN - 1 VIEW COMPARISON:  11/17/2015 FINDINGS: There again noted changes consistent with aortic stent graft placement. The overall appearance is stable. IVC filter is noted as well but somewhat obscured by the stent graft. Left external iliac artery stent is noted as well. Scattered large and small bowel gas  is noted. Dilated small bowel is noted in the mid abdomen on the right. There is a relative paucity of bowel gas on the left which may be related to distention of the stomach. IMPRESSION: Changes consistent with at least partial small bowel obstruction. Electronically Signed   By: Alcide Clever M.D.   On: 12/11/2015 12:00   Dg Abd 1 View  Result Date: 12/01/2015 CLINICAL DATA:  Small bowel obstruction EXAM: ABDOMEN - 1 VIEW COMPARISON:  11/30/2015 FINDINGS: Dilated small bowel loops again noted right abdomen and pelvis compatible with small bowel obstruction, unchanged. NG tube remains in the stomach. Stent graft again noted. IVC filter noted. No free air or visible organomegaly. IMPRESSION: Continued small bowel dilatation compatible with small bowel obstruction. No real change. Electronically Signed   By: Charlett Nose M.D.   On: 12/01/2015 07:09   Dg Chest Port 1 View  Result Date: 12/02/2015 CLINICAL DATA:  Post PICC line insertion  EXAM: PORTABLE CHEST 1 VIEW COMPARISON:  Brandi Heath 1, 2017 FINDINGS: The heart, hila, and mediastinum are unchanged. The right PICC line terminates near the cavoatrial junction. The distal tip of the NG tube is not assessed. There is tubing coiled back on itself projected over the midline of the upper chest, likely the portion of the NG tube outside dictation. High attenuation material in the lung bases is stable, possibly aspirated material. No other interval changes or acute abnormalities. IMPRESSION: 1. The right PICC line terminates near the caval atrial junction. Electronically Signed   By: Gerome Sam III M.D   On: 12/02/2015 18:49   Dg Abd 2 Views  Result Date: 12/07/2015 CLINICAL DATA:  Small bowel obstruction. EXAM: ABDOMEN - 2 VIEW COMPARISON:  Single-view of the abdomen 12/02/2015 and 12/06/2015. FINDINGS: NG tube remains in place. Mild gaseous distention of a loop of distal small bowel at 3.2 cm is unchanged. No free intraperitoneal air is identified. Cholecystectomy clips, aneurysm coils and aortoiliac stent graft early again seen. IMPRESSION: No marked change in mild gas distention of small bowel since the most recent examination compatible with small-bowel obstruction. Electronically Signed   By: Drusilla Kanner M.D.   On: 12/07/2015 10:12   Dg Abd Portable 1v  Result Date: 12/06/2015 CLINICAL DATA:  Follow up partial small bowel obstruction. EXAM: PORTABLE ABDOMEN - 1 VIEW COMPARISON:  12/02/2015 dating back to 10/01/2015, including CT abdomen and pelvis 11/30/2015 and 10/01/2015. FINDINGS: Nasogastric tube tip projects over the expected location of the midbody of a J-shaped stomach, unchanged. Several dilated loops of small bowel persist in the abdomen and pelvis, though these loops are less distended than on the examination 4 days ago. Gas and stool is present in the decompressed colon. No suggestion of free air on the supine image. Prior aorto bi-iliac stent grafting for the  previously identified abdominal aortic aneurysm. IVC filter in the lower IVC. Prior embolization of the right internal iliac artery. IMPRESSION: Persistent partial small bowel obstruction which is minimally improved since examination 4 days ago. Electronically Signed   By: Hulan Saas M.D.   On: 12/06/2015 10:56   Dg Abd Portable 1v  Result Date: 12/02/2015 CLINICAL DATA:  Small bowel obstruction. EXAM: PORTABLE ABDOMEN - 1 VIEW COMPARISON:  Brandi Heath 18, 2017 FINDINGS: A small bowel obstruction persists. No contrast is identified on today's study. However, only minimal contrast is seen in stomach previously. NG tube terminates in the left upper quadrant. IMPRESSION: Persistent small bowel obstruction. Electronically Signed   By: Gerome Sam III  M.D   On: 12/02/2015 16:01   Dg Abd Portable 1v-small Bowel Obstruction Protocol-initial, 8 Hr Delay  Result Date: 12/01/2015 CLINICAL DATA:  Small bowel obstruction. EXAM: PORTABLE ABDOMEN - 1 VIEW COMPARISON:  Brandi Heath 18, 2017 FINDINGS: The NG tube is stable. A small amount of contrast is seen in the stomach, new in the interval. No other contrast seen throughout the abdomen. Persistently dilated small bowel loops in the right abdomen consistent with small bowel obstruction. No other change. IMPRESSION: Small bowel obstruction. Electronically Signed   By: Gerome Sam III M.D   On: 12/01/2015 22:49   Dg Abd Portable 1v-small Bowel Protocol-position Verification  Result Date: 12/01/2015 CLINICAL DATA:  Followup small bowel obstruction. Nasogastric tube placement. EXAM: PORTABLE ABDOMEN - 1 VIEW COMPARISON:  12/01/2015 FINDINGS: Nasogastric tube is seen with tip in the body the stomach. Moderately dilated small bowel loops in the right abdomen show no significant change compared to recent study. Aortoiliac stent graft again seen as well as embolization coils adjacent to the right iliac limb of the graft. IVC filter again noted. IMPRESSION:  Nasogastric tube tip remains in the body the stomach. No significant change in dilated small bowel loops, consistent with small bowel obstruction. Electronically Signed   By: Myles Rosenthal M.D.   On: 12/01/2015 12:49   Dg Abd Portable 1v  Result Date: 11/30/2015 CLINICAL DATA:  Nasogastric tube placement EXAM: PORTABLE ABDOMEN - 1 VIEW COMPARISON:  11/30/2015 CT FINDINGS: The nasogastric tube extends well into the stomach with tip in the region of the distal gastric body. IMPRESSION: Nasogastric tube extends into the stomach. Electronically Signed   By: Ellery Plunk M.D.   On: 11/30/2015 22:55     Patient Profile     25F with dementia, chronic atrial fibrillation, presumed NICM/ chronic systolic CHF (EF 81-19% by echo in 09/2015), moderate-severe MR, mild-moderate TR, PASP and atrial septal aneurysm by echo 09/2015, history of PE/DVT, prior CVA, anemia of chronic disease, and seizure disorder who presented to [facility] ED on 11/30/2015 for evaluation of abdominal pain and acute encephalopathy, found to have recurrent SBO. Has had 4 admissions in last 6 months with AKI, AF, SBO (during admission 10/2015 seen by EP and afib felt likely to be permanent). This admission failed conservative mgmt and went to OR for laparotomy/LOA 12/14/15 as well as debridement of sacral decubitus ulcer, c/b post-op ileus, AKI, hypotension, severe protein calorie malnutrition.   Assessment & Plan    1. Recurrent SBO s/p surgery 12/14/15 - per surgical team/IM. Per palliative consults from several weeks ago, recommendation has been made for palliative to follow patient when she returns to SNF given recurrent hospitalizations.  2. AKI - improving.  3. Anemia of chronic disease - has remained generally stable since 11/2015, will need to be followed on anticoag.  4. Chronic atrial fib with mild RVR - maintained on low dose amiodarone drip for now given soft BP. When BP allows would consider adding back low dose IV BB.  Would try to avoid diltiazem if possible given low EF. Remains on IV heparin since she is NPO.  5. Presumed NICM/chronic systolic CHF with mod-severe MR and pulm HTN - no evidence of acute exacerbation today. Unable to optimize HF meds due to hypotension.  Signed, Charlton Haws, MD  12/18/2015, 10:05 AM   Dentia, no cardiac complaints surgery has made her liquid diet. Start eliquis back at 2.5 bid and d/c heparin If PO goes well can transition off iv amiodarone. BP  remains low limiting Rx CHF meds.  Charlton HawsPeter Jood Retana

## 2015-12-18 NOTE — Progress Notes (Signed)
ANTICOAGULATION CONSULT NOTE - Follow Up Consult  Pharmacy Consult for Eliquis Indication: atrial fibrillation, hx of stroke, hx of DVT/PE  Medications:  Infusions:  . amiodarone 30 mg/hr (12/18/15 0800)  . Marland KitchenTPN (CLINIMIX-E) Adult 65 mL/hr (12/18/15 0800)   And  . fat emulsion 240 mL (12/18/15 0800)  . heparin 1,300 Units/hr (12/18/15 0800)    Assessment: 50 yoF admitted with UTI/sepsis. PMH significant for a-fib, stroke, and DVT/PE on oral anticoagulation PTA with Eliquis, held for NPO status, SBO.  Pharmacy consulted to dose IV heparin for while Eliquis is on hold.    Significant events:  11/26: Eliquis was resumed  11/28: worsening SBO, eliquis d/ced, restart heparin gtt 12/01: hep drip d/ced at for exp lap with I&D of sacral wound and lysis of adhesion. Heparin held post-OR 12/3: ok with Dr. Gerrit Friends to resume heparin drip 12/5 Transition from Heparin drip to Eliquis PO   Today, 12/18/2015: - HL 0.42, increased to therapeutic range - No bleeding or infusion issues noted per RN (slight bleeding from IV site noted on 12/4 PM)  - Hgb low, decreased to 9; Plts WNL at low end of range (may be 2/2 ABLA from surgery 12/1) - SCr 0.92, Crcl ~ 44 ml/min  Goal of Therapy:  Heparin level 0.3-0.7 units/mL Monitor platelets by anticoagulation protocol: Yes   Plan:   Stop Heparin gtt at 1300  Eliquis 5mg  PO BID first dose at 1300 when heparin is d/c.  Continue to monitor renal function, CBC, s/s bleeding   Lynann Beaver PharmD, BCPS Pager [phone] 12/18/2015 10:36 AM

## 2015-12-19 DIAGNOSIS — G9340 Encephalopathy, unspecified: Secondary | ICD-10-CM

## 2015-12-19 DIAGNOSIS — N179 Acute kidney failure, unspecified: Secondary | ICD-10-CM

## 2015-12-19 DIAGNOSIS — Z7901 Long term (current) use of anticoagulants: Secondary | ICD-10-CM

## 2015-12-19 LAB — BASIC METABOLIC PANEL
Anion gap: 6 (ref 5–15)
BUN: 25 mg/dL — ABNORMAL HIGH (ref 6–20)
CALCIUM: 7.1 mg/dL — AB (ref 8.9–10.3)
CHLORIDE: 108 mmol/L (ref 101–111)
CO2: 22 mmol/L (ref 22–32)
CREATININE: 0.91 mg/dL (ref 0.44–1.00)
GFR calc non Af Amer: 56 mL/min — ABNORMAL LOW (ref >60)
Glucose, Bld: 100 mg/dL — ABNORMAL HIGH (ref 65–99)
Potassium: 4.9 mmol/L (ref 3.5–5.1)
SODIUM: 136 mmol/L (ref 135–145)

## 2015-12-19 LAB — CBC
HEMATOCRIT: 26 % — AB (ref 36.0–46.0)
HEMOGLOBIN: 9.3 g/dL — AB (ref 12.0–15.0)
MCH: 28.7 pg (ref 26.0–34.0)
MCHC: 35.8 g/dL (ref 30.0–36.0)
MCV: 80.2 fL (ref 78.0–100.0)
Platelets: 193 10*3/uL (ref 150–400)
RBC: 3.24 MIL/uL — AB (ref 3.87–5.11)
RDW: 15.6 % — ABNORMAL HIGH (ref 11.5–15.5)
WBC: 6.1 10*3/uL (ref 4.0–10.5)

## 2015-12-19 LAB — GLUCOSE, CAPILLARY
GLUCOSE-CAPILLARY: 81 mg/dL (ref 65–99)
Glucose-Capillary: 95 mg/dL (ref 65–99)
Glucose-Capillary: 85 mg/dL (ref 65–99)

## 2015-12-19 LAB — PHOSPHORUS: Phosphorus: 3.1 mg/dL (ref 2.5–4.6)

## 2015-12-19 LAB — MAGNESIUM: Magnesium: 1.4 mg/dL — ABNORMAL LOW (ref 1.7–2.4)

## 2015-12-19 MED ORDER — MAGNESIUM SULFATE 2 GM/50ML IV SOLN
2.0000 g | Freq: Once | INTRAVENOUS | Status: AC
  Administered 2015-12-19: 2 g via INTRAVENOUS
  Filled 2015-12-19: qty 50

## 2015-12-19 MED ORDER — AMIODARONE HCL 200 MG PO TABS
200.0000 mg | ORAL_TABLET | Freq: Every day | ORAL | Status: DC
  Administered 2015-12-19 – 2015-12-24 (×5): 200 mg via ORAL
  Filled 2015-12-19 (×6): qty 1

## 2015-12-19 MED ORDER — ADULT MULTIVITAMIN LIQUID CH
15.0000 mL | Freq: Every day | ORAL | Status: DC
  Administered 2015-12-19 – 2015-12-25 (×7): 15 mL via ORAL
  Filled 2015-12-19 (×8): qty 15

## 2015-12-19 NOTE — Progress Notes (Signed)
Received patient from ICU. Alert and oriented. 

## 2015-12-19 NOTE — Progress Notes (Signed)
Nutrition Follow-up  DOCUMENTATION CODES:   Severe malnutrition in context of acute illness/injury, Underweight  INTERVENTION:  - Will order daily liquid multivitamin.  - Continue Boost Breeze BID and Prostat (mixed in pt's beverage of choice) BID. - Diet advancement as medically feasible. - Continue to encourage PO intakes of meals and supplements. - RD will follow-up 12/8.  NUTRITION DIAGNOSIS:   Inadequate oral intake related to inability to eat as evidenced by NPO status. -diet advanced to CLD with <25% intakes.  GOAL:   Patient will meet greater than or equal to 90% of their needs -unmet  MONITOR:   PO intake, Supplement acceptance, Diet advancement, Weight trends, Labs, Skin, I & O's  ASSESSMENT:   82 y.o. female with medical history significant of  CAD, dementia, DVT, OSA, D, atrial fibrillation on anticoagulation, recurrent small bowel obstruction, CHF nonischemic cardiomyopathy with mitral regurgitation, seizure disorder, recent stroke, As with abdominal pain, vomiting and altered mental status.  12/6 TPN off since 1800 yesterday and pt continues on CLD since yesterday at 0914. Documented 25% of dinner last night. Pt sleeping at time of RD visit and no family/visitors present. Breakfast tray on bedside table and is untouched. Per review of order, pt accepted both packets of Prostat and once of two containers of Boost Breeze yesterday. Weight remains stable.  BP: 110/61 and MAP: 77.  Medications reviewed; 750 mg IV Keppra BID, 2 g IV Mg sulfate x1 dose today and x1 dose yesterday, PRN IV Zofran, 40 mg IV Protonix BID. Labs reviewed; BUN: 25 mg/dL, Ca: 7.1 mg/dL, Mg: 1.4 mg/dL.  Drip: Amiodarone @ 30 mg/hr.    12/5 - NGT now out.  - Pt remains NPO and TPN is currently at goal rate: Clinimix E 5/15 @ 65 mL/hr with 20% ILE @ 10 mL/hr which is providing 78 grams of protein and 1588 kcal.  - Dr. Ermalene SearingMartin's note from this AM indicates plan to start CLD today. - Talked with  Pharmacist who reports plan to d/c TPN tonight at 1800 d/t national shortage and diet advancement; will order oral nutrition supplements to assist in meeting estimated protein needs.  - Weight stable from yesterday.  - Estimated nutrition needs remain the same at this time.   Drips: Heparin @ 1300 units/hr, Amiodarone @ 30 mg/hr.    12/4 - Pt remains NPO and is currently receiving Clinimix E 5/15 @ 50 mL/hr with 20% ILE @ 10 mL/hr.  - Per Pharmacy note this AM, plan to advance to goal TPN rate today at 1800: Clinimix E 5/15 @ 65 mL/hr with 20% ILE @ 10 mL/hr.  - At goal rate, this regimen will provide 78 grams of protein and 1588 kcal. - Weight stable from yesterday and -3.4 kg from admission.  - NGT in place to suction with documented 135cc output this shift.  - Pt denies any abdominal pain or nausea at time of RD visit this AM.  Drips: Heparin @ 1150 units/hr, Amiodarone @ 30 mg/hr.    Diet Order:  Diet clear liquid Room service appropriate? Yes; Fluid consistency: Thin  Skin:  Wound (see comment) (Unstageable, full thickness sacral pressure injury)  Last BM:  11/30  Height:   Ht Readings from Last 1 Encounters:  12/01/15 5\' 9"  (1.753 m)    Weight:   Wt Readings from Last 1 Encounters:  12/19/15 139 lb 5.3 oz (63.2 kg)    Ideal Body Weight:  65.9 kg  BMI:  Body mass index is 20.58 kg/m.  Estimated Nutritional Needs:   Kcal:  1350-1600  Protein:  70-80g  Fluid:  1.5-1.7L/day  EDUCATION NEEDS:   No education needs identified at this time    Trenton Gammon, MS, RD, LDN, CNSC Inpatient Clinical Dietitian Pager # [phone] After hours/weekend pager # [phone]

## 2015-12-19 NOTE — Progress Notes (Signed)
Patient ID: Brandi Heath, female   DOB: [birth year], 82 y.o.   MRN: [id number] [facility] Surgery Progress Note:   5 Days Post-Op  Subjective: Mental status is more clear today.  Complaining of her bottom hurting Objective: Vital signs in last 24 hours: Temp:  [97.8 F (36.6 C)-98.9 F (37.2 C)] 98 F (36.7 C) (12/06 0800) Pulse Rate:  [53-140] 108 (12/06 0712) Resp:  [11-23] 18 (12/06 0712) BP: (85-112)/(49-85) 106/53 (12/06 0712) SpO2:  [96 %-100 %] 100 % (12/06 0712) Weight:  [63.2 kg (139 lb 5.3 oz)] 63.2 kg (139 lb 5.3 oz) (12/06 0500)  Intake/Output from previous day: 12/05 0701 - 12/06 0700 In: 1345.1 [P.O.:240; I.V.:1105.1] Out: 0600 [[id number]] Intake/Output this shift: No intake/output data recorded.  Physical Exam: Work of breathing is not labored.  Sacral area without evidence of pressure sore or perirectal  Lab Results:  Results for orders placed or performed during the hospital encounter of 11/30/15 (from the past 48 hour(s))  Glucose, capillary     Status: Abnormal   Collection Time: 12/17/15 12:12 PM  Result Value Ref Range   Glucose-Capillary 124 (H) 65 - 99 mg/dL   Comment 1 Notify RN    Comment 2 Document in Chart   Heparin level (unfractionated)     Status: Abnormal   Collection Time: 12/17/15  2:00 PM  Result Value Ref Range   Heparin Unfractionated 0.26 (L) 0.30 - 0.70 IU/mL    Comment:        IF HEPARIN RESULTS ARE BELOW EXPECTED VALUES, AND PATIENT DOSAGE HAS BEEN CONFIRMED, SUGGEST FOLLOW UP TESTING OF ANTITHROMBIN III LEVELS.   Glucose, capillary     Status: Abnormal   Collection Time: 12/17/15  5:24 PM  Result Value Ref Range   Glucose-Capillary 104 (H) 65 - 99 mg/dL   Comment 1 Notify RN    Comment 2 Document in Chart   Heparin level (unfractionated)     Status: Abnormal   Collection Time: 12/17/15 10:50 PM  Result Value Ref Range   Heparin Unfractionated 0.29 (L) 0.30 - 0.70 IU/mL    Comment:        IF HEPARIN RESULTS ARE  BELOW EXPECTED VALUES, AND PATIENT DOSAGE HAS BEEN CONFIRMED, SUGGEST FOLLOW UP TESTING OF ANTITHROMBIN III LEVELS.   Glucose, capillary     Status: Abnormal   Collection Time: 12/17/15 11:01 PM  Result Value Ref Range   Glucose-Capillary 121 (H) 65 - 99 mg/dL  CBC     Status: None   Collection Time: 12/18/15  4:38 AM  Result Value Ref Range   WBC  4.0 - 10.5 K/uL    QUESTIONABLE RESULTS, RECOMMEND RECOLLECT TO VERIFY    Comment: SEE RECOLLECT CORRECTED ON 12/05 AT 2395: PREVIOUSLY REPORTED AS 5.8    RBC  3.87 - 5.11 MIL/uL    QUESTIONABLE RESULTS, RECOMMEND RECOLLECT TO VERIFY    Comment: SEE RECOLLECT CORRECTED ON 12/05 AT 3202: PREVIOUSLY REPORTED AS 3.03    Hemoglobin  12.0 - 15.0 g/dL    QUESTIONABLE RESULTS, RECOMMEND RECOLLECT TO VERIFY    Comment: SEE RECOLLECT CORRECTED ON 12/05 AT 3343: PREVIOUSLY REPORTED AS 9.0    HCT  36.0 - 46.0 %    QUESTIONABLE RESULTS, RECOMMEND RECOLLECT TO VERIFY    Comment: SEE RECOLLECT CORRECTED ON 12/05 AT 5686: PREVIOUSLY REPORTED AS 25.0    MCV  78.0 - 100.0 fL    QUESTIONABLE RESULTS, RECOMMEND RECOLLECT TO VERIFY    Comment: SEE RECOLLECT CORRECTED ON 12/05  AT 0721: PREVIOUSLY REPORTED AS 82.5    MCH  26.0 - 34.0 pg    QUESTIONABLE RESULTS, RECOMMEND RECOLLECT TO VERIFY    Comment: SEE RECOLLECT CORRECTED ON 12/05 AT 3785: PREVIOUSLY REPORTED AS 29.7    MCHC  30.0 - 36.0 g/dL    QUESTIONABLE RESULTS, RECOMMEND RECOLLECT TO VERIFY    Comment: SEE RECOLLECT CORRECTED ON 12/05 AT 8850: PREVIOUSLY REPORTED AS 36.0    RDW  11.5 - 15.5 %    QUESTIONABLE RESULTS, RECOMMEND RECOLLECT TO VERIFY    Comment: SEE RECOLLECT CORRECTED ON 12/05 AT 2774: PREVIOUSLY REPORTED AS 15.9    Platelets  150 - 400 K/uL    QUESTIONABLE RESULTS, RECOMMEND RECOLLECT TO VERIFY    Comment: SEE RECOLLECT CORRECTED ON 12/05 AT 1287: PREVIOUSLY REPORTED AS 165   Comprehensive metabolic panel     Status: Abnormal   Collection Time: 12/18/15  5:35  AM  Result Value Ref Range   Sodium 135 135 - 145 mmol/L   Potassium 3.8 3.5 - 5.1 mmol/L   Chloride 108 101 - 111 mmol/L   CO2 22 22 - 32 mmol/L   Glucose, Bld 131 (H) 65 - 99 mg/dL   BUN 26 (H) 6 - 20 mg/dL   Creatinine, Ser 0.92 0.44 - 1.00 mg/dL   Calcium 8.5 (L) 8.9 - 10.3 mg/dL   Total Protein 5.2 (L) 6.5 - 8.1 g/dL   Albumin 2.3 (L) 3.5 - 5.0 g/dL   AST 15 15 - 41 U/L   ALT 12 (L) 14 - 54 U/L   Alkaline Phosphatase 46 38 - 126 U/L   Total Bilirubin 0.4 0.3 - 1.2 mg/dL   GFR calc non Af Amer 55 (L) >60 mL/min   GFR calc Af Amer >60 >60 mL/min    Comment: (NOTE) The eGFR has been calculated using the CKD EPI equation. This calculation has not been validated in all clinical situations. eGFR's persistently <60 mL/min signify possible Chronic Kidney Disease.    Anion gap 5 5 - 15  Magnesium     Status: None   Collection Time: 12/18/15  5:35 AM  Result Value Ref Range   Magnesium 1.8 1.7 - 2.4 mg/dL  Phosphorus     Status: None   Collection Time: 12/18/15  5:35 AM  Result Value Ref Range   Phosphorus 3.4 2.5 - 4.6 mg/dL  CBC     Status: Abnormal   Collection Time: 12/18/15  5:35 AM  Result Value Ref Range   WBC 6.1 4.0 - 10.5 K/uL   RBC 3.12 (L) 3.87 - 5.11 MIL/uL   Hemoglobin 8.9 (L) 12.0 - 15.0 g/dL   HCT 25.2 (L) 36.0 - 46.0 %   MCV 80.8 78.0 - 100.0 fL   MCH 28.5 26.0 - 34.0 pg   MCHC 35.3 30.0 - 36.0 g/dL   RDW 15.5 11.5 - 15.5 %   Platelets 157 150 - 400 K/uL  Glucose, capillary     Status: Abnormal   Collection Time: 12/18/15  6:29 AM  Result Value Ref Range   Glucose-Capillary 132 (H) 65 - 99 mg/dL   Comment 1 Notify RN    Comment 2 Document in Chart   Heparin level (unfractionated)     Status: None   Collection Time: 12/18/15  9:24 AM  Result Value Ref Range   Heparin Unfractionated 0.42 0.30 - 0.70 IU/mL    Comment:        IF HEPARIN RESULTS ARE BELOW EXPECTED VALUES, AND  PATIENT DOSAGE HAS BEEN CONFIRMED, SUGGEST FOLLOW UP TESTING OF  ANTITHROMBIN III LEVELS.   Glucose, capillary     Status: Abnormal   Collection Time: 12/18/15 12:11 PM  Result Value Ref Range   Glucose-Capillary 115 (H) 65 - 99 mg/dL  Glucose, capillary     Status: Abnormal   Collection Time: 12/18/15  6:29 PM  Result Value Ref Range   Glucose-Capillary 101 (H) 65 - 99 mg/dL  Glucose, capillary     Status: None   Collection Time: 12/18/15 11:42 PM  Result Value Ref Range   Glucose-Capillary 95 65 - 99 mg/dL  Basic metabolic panel     Status: Abnormal   Collection Time: 12/19/15  4:01 AM  Result Value Ref Range   Sodium 136 135 - 145 mmol/L   Potassium 4.9 3.5 - 5.1 mmol/L    Comment: DELTA CHECK NOTED REPEATED TO VERIFY NO VISIBLE HEMOLYSIS    Chloride 108 101 - 111 mmol/L   CO2 22 22 - 32 mmol/L   Glucose, Bld 100 (H) 65 - 99 mg/dL   BUN 25 (H) 6 - 20 mg/dL   Creatinine, Ser 0.91 0.44 - 1.00 mg/dL   Calcium 7.1 (L) 8.9 - 10.3 mg/dL   GFR calc non Af Amer 56 (L) >60 mL/min   GFR calc Af Amer >60 >60 mL/min    Comment: (NOTE) The eGFR has been calculated using the CKD EPI equation. This calculation has not been validated in all clinical situations. eGFR's persistently <60 mL/min signify possible Chronic Kidney Disease.    Anion gap 6 5 - 15  Magnesium     Status: Abnormal   Collection Time: 12/19/15  4:01 AM  Result Value Ref Range   Magnesium 1.4 (L) 1.7 - 2.4 mg/dL  Phosphorus     Status: None   Collection Time: 12/19/15  4:01 AM  Result Value Ref Range   Phosphorus 3.1 2.5 - 4.6 mg/dL  CBC     Status: Abnormal   Collection Time: 12/19/15  4:01 AM  Result Value Ref Range   WBC 6.1 4.0 - 10.5 K/uL   RBC 3.24 (L) 3.87 - 5.11 MIL/uL   Hemoglobin 9.3 (L) 12.0 - 15.0 g/dL   HCT 26.0 (L) 36.0 - 46.0 %   MCV 80.2 78.0 - 100.0 fL   MCH 28.7 26.0 - 34.0 pg   MCHC 35.8 30.0 - 36.0 g/dL   RDW 15.6 (H) 11.5 - 15.5 %   Platelets 193 150 - 400 K/uL    Radiology/Results: No results found.  Anti-infectives: Anti-infectives     Start     Dose/Rate Route Frequency Ordered Stop   12/14/15 0600  clindamycin (CLEOCIN) IVPB 900 mg  Status:  Discontinued     900 mg 100 mL/hr over 30 Minutes Intravenous On call to O.R. 12/13/15 1305 12/14/15 1618   12/14/15 0600  gentamicin (GARAMYCIN) 310 mg in dextrose 5 % 100 mL IVPB  Status:  Discontinued     5 mg/kg  61.4 kg 107.8 mL/hr over 60 Minutes Intravenous On call to O.R. 12/13/15 1305 12/14/15 1618   12/03/15 0830  fluconazole (DIFLUCAN) tablet 150 mg     150 mg Oral  Once 12/03/15 0828 12/03/15 0859   12/02/15 2200  levofloxacin (LEVAQUIN) IVPB 500 mg  Status:  Discontinued     500 mg 100 mL/hr over 60 Minutes Intravenous Every 48 hours 11/30/15 2202 12/03/15 1036   11/30/15 2230  aztreonam (AZACTAM) 500 mg in dextrose 5 %  50 mL IVPB  Status:  Discontinued     500 mg 100 mL/hr over 30 Minutes Intravenous Every 8 hours 11/30/15 2203 12/01/15 0732   11/30/15 2145  levofloxacin (LEVAQUIN) IVPB 750 mg     750 mg 100 mL/hr over 90 Minutes Intravenous  Once 11/30/15 2130 11/30/15 2327   11/30/15 2145  aztreonam (AZACTAM) 2 g in dextrose 5 % 50 mL IVPB  Status:  Discontinued     2 g 100 mL/hr over 30 Minutes Intravenous  Once 11/30/15 2130 11/30/15 2203      Assessment/Plan: Problem List: Patient Active Problem List   Diagnosis Date Noted  . Atrial fibrillation with RVR ([place])   . Hypotension   . S/P exploratory laparotomy 12/14/2015  . Hypokalemia   . Hypomagnesemia   . Chronic anticoagulation 12/06/2015  . Encounter for hospice care discussion   . Palliative care encounter   . Goals of care, counseling/discussion   . Protein-calorie malnutrition, severe 12/03/2015  . Pressure injury of skin 12/01/2015  . Acute renal failure (ARF) ([place]) 11/30/2015  . Dehydration 11/30/2015  . Acute kidney injury ([place]) 11/14/2015  . Non-ischemic cardiomyopathy ([place]) 11/11/2015  . MR (mitral regurgitation) 11/11/2015  . Acute respiratory failure with hypoxia ([place]) 10/17/2015  .  Slurred speech   . Left sided numbness   . Acute on chronic diastolic heart failure ([place]) 10/04/2015  . Normocytic anemia 10/04/2015  . Thrombocytopenia ([place]) 10/04/2015  . TIA (transient ischemic attack) 10/04/2015  . Cough 10/02/2015  . SBO (small bowel obstruction) 10/01/2015  . Chronic CHF (congestive heart failure) ([place]) 09/29/2015  . Venous insufficiency (chronic) (peripheral) 09/29/2015  . Vitamin B12 deficiency 09/29/2015  . Anemia, chronic disease 09/29/2015  . Enterococcus UTI 09/02/2015  . GERD (gastroesophageal reflux disease) 09/02/2015  . Adynamic ileus ([place]) 06/11/2015  . CHF, acute on chronic ([place]) 06/11/2015  . Personal history of DVT (deep vein thrombosis) 06/11/2015  . Non-intractable vomiting with nausea 06/03/2015  . Abdominal aortic aneurysm without rupture ([place]) 05/31/2015  . Jaw pain 05/27/2015  . E. coli UTI 04/22/2015  . Prerenal acute renal failure ([place]) 04/22/2015  . History of CVA (cerebrovascular accident) 04/22/2015  . AF (atrial fibrillation) ([place]) 04/22/2015  . Polyneuropathy ([place]) 04/22/2015  . Edema 01/25/2015  . Atrial fibrillation with rapid ventricular response ([place]) 01/20/2015  . Acute encephalopathy 01/20/2015  . UTI (urinary tract infection) 01/20/2015  . Tracheobronchomalacia 01/20/2015  . Personal history of PE (pulmonary embolism) 01/20/2015  . Depression 01/20/2015  . OSA (obstructive sleep apnea)   . Dementia with behavioral disturbance   . Seizures ([place])   . Hyperlipidemia   . Hypertensive heart disease with CHF (congestive heart failure) ([place])   . Clotting disorder ([place])   . Cerebrovascular accident (CVA) due to thrombosis ([place])     Still on Amiodarone drip.  Taking clears. Not much bowel function.   5 Days Post-Op    LOS: 19 days   Matt B. Hassell Done, MD, [facility] [phone] beeper [phone]  12/19/2015 9:55 AM

## 2015-12-19 NOTE — Progress Notes (Signed)
ANTICOAGULATION CONSULT NOTE - Follow Up Consult  Pharmacy Consult for Eliquis Indication: atrial fibrillation, hx of stroke, hx of DVT/PE  Medications:  Infusions:  . amiodarone 30 mg/hr (12/19/15 0600)   Assessment: 5 yoF admitted with UTI/sepsis. PMH significant for a-fib, stroke, and DVT/PE on oral anticoagulation PTA with Eliquis, held for NPO status, SBO.  Pharmacy consulted to dose IV heparin for while Eliquis is on hold.    Significant events:  11/26: Eliquis was resumed  11/28: worsening SBO, eliquis d/ced, restart heparin gtt 12/01: hep drip d/ced at for exp lap with I&D of sacral wound and lysis of adhesion. Heparin held post-OR 12/3: ok with Dr. Gerrit Friends to resume heparin drip 12/5 Transition from Heparin drip to Eliquis PO   Today, 12/19/2015: - HL 0.42, increased to therapeutic range - No bleeding or infusion issues noted per RN (slight bleeding from IV site noted on 12/4 PM)  - Hgb low, decreased to 9; Plts WNL at low end of range (may be 2/2 ABLA from surgery 12/1) - SCr 0.92, Crcl ~ 44 ml/min  Goal of Therapy:  Heparin level 0.3-0.7 units/mL Monitor platelets by anticoagulation protocol: Yes   Plan:   Eliquis 2.55mg  PO BID   Continue to monitor renal function, CBC, s/s bleeding  Otho Bellows PharmD Pager [phone] 12/19/2015, 11:18 AM

## 2015-12-19 NOTE — Progress Notes (Signed)
PROGRESS NOTE    Brandi Heath  GDJ:[id number] DOB: [birth year] DOA: 11/30/2015 PCP: Merrilee Seashore, MD    Brief Narrative:  82 year old female with history of A. 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  Patient having recurrent small bowel obstructions (4 episodes over the last few weeks). Surgery consulted and patient taken to or on 12/1For laparotomy and lysis of adhesions.  stepdown unit postop.  Assessment & Plan:   Principal Problem:   SBO (small bowel obstruction) Active Problems:   OSA (obstructive sleep apnea)   Dementia with behavioral disturbance   Hypertensive heart disease with CHF (congestive heart failure) (HCC)   Atrial fibrillation with rapid ventricular response (HCC)   Acute encephalopathy   UTI (urinary tract infection)   History of CVA (cerebrovascular accident)   GERD (gastroesophageal reflux disease)   Anemia, chronic disease   Non-ischemic cardiomyopathy (HCC)   MR (mitral regurgitation)   Acute kidney injury (HCC)   Acute renal failure (ARF) (HCC)   Dehydration   Pressure injury of skin   Protein-calorie malnutrition, severe   Palliative care encounter   Goals of care, counseling/discussion   Chronic anticoagulation   Encounter for hospice care discussion   Hypokalemia   Hypomagnesemia   S/P exploratory laparotomy   Hypotension   Atrial fibrillation with RVR (HCC)    Principal Problem:   Recurrent SBO (small bowel obstruction) - patient with recurrent SBO, s/p laparotomy w/ lysis of adhesions on 12/1 - Patient is tolerating clears and PO medications - Pos bowel sounds auscultated on exam  Active Problems:   Atrial fibrillation with RVR (HCC) - Off cardizem gtt secondary to low BP - Patient currently on amiodarone gtt - Cardiology recommendations noted. Patient recommended to transition to PO amiodarone when - On  eliquis for secondary stroke prevention - Discussed with pharmacy, to transition to PO amidarone today   Hypotension - BP stable at present - Continue to monitor  Sacral decubitus ulcer - s/p debridement  Seizure disorder - thus far seizure-free - Continue dilantin as scheduled  Acute kidney injury - Suspect secondary to dehydration - Renal function improved with IVF - Repeat BMET in AM  Hypokalemia/hypomagnesemia - Replaced - Repeat bmet in AM - Repeat mg in AM  Severe protein calorie malnutrition - replaced  Anemia of chronic disease - At baseline. Transfuse if <7   Moderate  dementia - appears stable at this time  DVT prophylaxis: Eliquis Code Status: DNR Family Communication: Pt in room, family not at bedside Disposition Plan: Per primary service   Procedures:  Laparotomy with lysis of adhesions on 12/1 Sacral ulcer debridement on 12/1.  Antimicrobials: Anti-infectives    Start     Dose/Rate Route Frequency Ordered Stop   12/14/15 0600  clindamycin (CLEOCIN) IVPB 900 mg  Status:  Discontinued     900 mg 100 mL/hr over 30 Minutes Intravenous On call to O.R. 12/13/15 1305 12/14/15 1618   12/14/15 0600  gentamicin (GARAMYCIN) 310 mg in dextrose 5 % 100 mL IVPB  Status:  Discontinued     5 mg/kg  61.4 kg 107.8 mL/hr over 60 Minutes Intravenous On call to O.R. 12/13/15 1305 12/14/15 1618   12/03/15 0830  fluconazole (DIFLUCAN) tablet 150 mg     150 mg Oral  Once 12/03/15 0828 12/03/15 0859   12/02/15 2200  levofloxacin (LEVAQUIN) IVPB 500 mg  Status:  Discontinued     500  mg 100 mL/hr over 60 Minutes Intravenous Every 48 hours 11/30/15 2202 12/03/15 1036   11/30/15 2230  aztreonam (AZACTAM) 500 mg in dextrose 5 % 50 mL IVPB  Status:  Discontinued     500 mg 100 mL/hr over 30 Minutes Intravenous Every 8 hours 11/30/15 2203 12/01/15 0732   11/30/15 2145  levofloxacin (LEVAQUIN) IVPB 750 mg     750 mg 100 mL/hr over 90 Minutes Intravenous  Once  11/30/15 2130 11/30/15 2327   11/30/15 2145  aztreonam (AZACTAM) 2 g in dextrose 5 % 50 mL IVPB  Status:  Discontinued     2 g 100 mL/hr over 30 Minutes Intravenous  Once 11/30/15 2130 11/30/15 2203      Subjective: Reports no flatus this AM. Denies nausea  Objective: Vitals:   12/19/15 0600 12/19/15 0712 12/19/15 0800 12/19/15 1133  BP: 101/64 (!) 106/53    Pulse: (!) 120 (!) 108    Resp: (!) 23 18    Temp:   98 F (36.7 C) 98.5 F (36.9 C)  TempSrc:   Oral Oral  SpO2: 96% 100%    Weight:      Height:        Intake/Output Summary (Last 24 hours) at 12/19/15 1356 Last data filed at 12/19/15 0600  Gross per 24 hour  Intake            781.9 ml  Output             1310 ml  Net           -528.1 ml   Filed Weights   12/17/15 0400 12/18/15 0441 12/19/15 0500  Weight: 62.8 kg (138 lb 7.2 oz) 63.5 kg (139 lb 15.9 oz) 63.2 kg (139 lb 5.3 oz)    Examination:  General exam: Appears calm and comfortable  Respiratory system: Clear to auscultation. Respiratory effort normal. Cardiovascular system: S1 & S2 heard, RRR. No JVD, murmurs, rubs, gallops or clicks. No pedal edema. Gastrointestinal system: Abdomen is nondistended, soft and nontender. No organomegaly or masses felt. Normal bowel sounds heard. Central nervous system: Alert and oriented. No focal neurological deficits. Extremities: Symmetric 5 x 5 power. Skin: No rashes, lesions Psychiatry: Judgement and insight appear normal. Mood & affect appropriate.   Data Reviewed: I have personally reviewed following labs and imaging studies  CBC:  Recent Labs Lab 12/15/15 0359 12/16/15 0336 12/17/15 0501 12/18/15 0438 12/18/15 0535 12/19/15 0401  WBC 8.1 7.8 7.2 QUESTIONABLE RESULTS, RECOMMEND RECOLLECT TO VERIFY 6.1 6.1  NEUTROABS 6.6  --  5.6  --   --   --   HGB 11.5* 9.7* 9.7* QUESTIONABLE RESULTS, RECOMMEND RECOLLECT TO VERIFY 8.9* 9.3*  HCT 32.4* 27.2* 27.1* QUESTIONABLE RESULTS, RECOMMEND RECOLLECT TO VERIFY 25.2*  26.0*  MCV 82.0 82.2 81.6 QUESTIONABLE RESULTS, RECOMMEND RECOLLECT TO VERIFY 80.8 80.2  PLT 146* 139* 150 QUESTIONABLE RESULTS, RECOMMEND RECOLLECT TO VERIFY 157 193   Basic Metabolic Panel:  Recent Labs Lab 12/15/15 0359 12/16/15 0336 12/17/15 0501 12/18/15 0535 12/19/15 0401  NA 134* 132* 133* 135 136  K 4.2 3.9 3.3* 3.8 4.9  CL 106 105 104 108 108  CO2 21* 20* 21* 22 22  GLUCOSE 147* 140* 125* 131* 100*  BUN 23* 22* 22* 26* 25*  CREATININE 1.28* 1.21* 1.14* 0.92 0.91  CALCIUM 8.1* 8.3* 8.4* 8.5* 7.1*  MG 1.5* 1.9 1.8 1.8 1.4*  PHOS 3.5 3.2 3.4 3.4 3.1   GFR: Estimated Creatinine Clearance: 44.3 mL/min (by C-G formula  based on SCr of 0.91 mg/dL). Liver Function Tests:  Recent Labs Lab 12/15/15 0359 12/16/15 0336 12/17/15 0501 12/18/15 0535  AST 16 13* 12* 15  ALT 12* 11* 11* 12*  ALKPHOS 53 54 54 46  BILITOT 1.4* 0.6 0.7 0.4  PROT 5.6* 5.5* 5.5* 5.2*  ALBUMIN 2.6* 2.4* 2.4* 2.3*   No results for input(s): LIPASE, AMYLASE in the last 168 hours. No results for input(s): AMMONIA in the last 168 hours. Coagulation Profile: No results for input(s): INR, PROTIME in the last 168 hours. Cardiac Enzymes: No results for input(s): CKTOTAL, CKMB, CKMBINDEX, TROPONINI in the last 168 hours. BNP (last 3 results) No results for input(s): PROBNP in the last 8760 hours. HbA1C: No results for input(s): HGBA1C in the last 72 hours. CBG:  Recent Labs Lab 12/18/15 0629 12/18/15 1211 12/18/15 1829 12/18/15 2342 12/19/15 1126  GLUCAP 132* 115* 101* 95 85   Lipid Profile:  Recent Labs  12/17/15 0501  TRIG 87   Thyroid Function Tests: No results for input(s): TSH, T4TOTAL, FREET4, T3FREE, THYROIDAB in the last 72 hours. Anemia Panel: No results for input(s): VITAMINB12, FOLATE, FERRITIN, TIBC, IRON, RETICCTPCT in the last 72 hours. Sepsis Labs: No results for input(s): PROCALCITON, LATICACIDVEN in the last 168 hours.  No results found for this or any previous  visit (from the past 240 hour(s)).   Radiology Studies: No results found.  Scheduled Meds: . amiodarone  200 mg Oral Daily  . apixaban  2.5 mg Oral BID  . chlorhexidine  15 mL Mouth Rinse BID  . feeding supplement  1 Container Oral BID BM  . feeding supplement (PRO-STAT SUGAR FREE 64)  30 mL Oral BID  . latanoprost  1 drop Both Eyes QHS  . levETIRAcetam  750 mg Intravenous Q12H  . lip balm  1 application Topical BID  . mouth rinse  15 mL Mouth Rinse q12n4p  . multivitamin  15 mL Oral Daily  . MUSCLE RUB   Topical QHS  . pantoprazole (PROTONIX) IV  40 mg Intravenous Q12H  . sodium chloride flush  10-40 mL Intracatheter Q12H  . sodium chloride flush  3 mL Intravenous Q12H   Continuous Infusions:    LOS: 19 days   CHIU, Scheryl MartenSTEPHEN K, MD [facility] Pager [phone]  If 7PM-7AM, please contact night-coverage [email or web address] Password [email or web address] 12/19/2015, 1:56 PM

## 2015-12-20 ENCOUNTER — Inpatient Hospital Stay (HOSPITAL_COMMUNITY): Payer: Medicare Other

## 2015-12-20 LAB — CBC
HCT: 25.5 % — ABNORMAL LOW (ref 36.0–46.0)
Hemoglobin: 8.9 g/dL — ABNORMAL LOW (ref 12.0–15.0)
MCH: 28.6 pg (ref 26.0–34.0)
MCHC: 34.9 g/dL (ref 30.0–36.0)
MCV: 82 fL (ref 78.0–100.0)
PLATELETS: 217 10*3/uL (ref 150–400)
RBC: 3.11 MIL/uL — ABNORMAL LOW (ref 3.87–5.11)
RDW: 16 % — AB (ref 11.5–15.5)
WBC: 6 10*3/uL (ref 4.0–10.5)

## 2015-12-20 LAB — BASIC METABOLIC PANEL
Anion gap: 5 (ref 5–15)
BUN: 22 mg/dL — ABNORMAL HIGH (ref 6–20)
CALCIUM: 8.4 mg/dL — AB (ref 8.9–10.3)
CO2: 22 mmol/L (ref 22–32)
CREATININE: 0.89 mg/dL (ref 0.44–1.00)
Chloride: 108 mmol/L (ref 101–111)
GFR, EST NON AFRICAN AMERICAN: 57 mL/min — AB (ref >60)
GLUCOSE: 89 mg/dL (ref 65–99)
Potassium: 3.6 mmol/L (ref 3.5–5.1)
Sodium: 135 mmol/L (ref 135–145)

## 2015-12-20 LAB — MAGNESIUM: MAGNESIUM: 1.7 mg/dL (ref 1.7–2.4)

## 2015-12-20 NOTE — Progress Notes (Signed)
PROGRESS NOTE    Brandi Heath  ZOX:[id number] DOB: [birth year] DOA: 11/30/2015 PCP: Merrilee Seashore, MD    Brief Narrative:  82 year old female with history of A. 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  Patient having recurrent small bowel obstructions (4 episodes over the last few weeks). Surgery consulted and patient taken to or on 12/1For laparotomy and lysis of adhesions.  stepdown unit postop.  Assessment & Plan:   Principal Problem:   SBO (small bowel obstruction) Active Problems:   OSA (obstructive sleep apnea)   Dementia with behavioral disturbance   Hypertensive heart disease with CHF (congestive heart failure) (HCC)   Atrial fibrillation with rapid ventricular response (HCC)   Acute encephalopathy   UTI (urinary tract infection)   History of CVA (cerebrovascular accident)   GERD (gastroesophageal reflux disease)   Anemia, chronic disease   Non-ischemic cardiomyopathy (HCC)   MR (mitral regurgitation)   Acute kidney injury (HCC)   Acute renal failure (ARF) (HCC)   Dehydration   Pressure injury of skin   Protein-calorie malnutrition, severe   Palliative care encounter   Goals of care, counseling/discussion   Chronic anticoagulation   Encounter for hospice care discussion   Hypokalemia   Hypomagnesemia   S/P exploratory laparotomy   Hypotension   Atrial fibrillation with RVR (HCC)    Principal Problem:   Recurrent SBO (small bowel obstruction) - patient with recurrent SBO, s/p laparotomy w/ lysis of adhesions on 12/1 - Pt reportedly tolerating PO. Advancing diet. - Multiple bowel movements reported overnight  Active Problems:   Atrial fibrillation with RVR (HCC) - Remains off cardizem gtt secondary to low BP - Pt had been seen by Cardiology. Recommendations noted - On eliquis for secondary stroke prevention - Patient on po amiodarone.  Remains rate controlled  Hypotension - BP stable and controlled - Continue to monitor  Sacral decubitus ulcer - s/p debridement - Per surgery  Seizure disorder - Continue dilantin as scheduled - Stable this AM  Acute kidney injury - Suspect secondary to dehydration - Cr has normalized today  Hypokalemia/hypomagnesemia - Electrolytes reviewed and are unremarkable  Severe protein calorie malnutrition - Nutrition consulted  Anemia of chronic disease - At baseline. Transfuse if <7  Moderate  dementia - appears stable at this time  SOB - Nursing reports mild sob - No LE edema - have ordered cxr and reviewed. Overall clear without florid edema  DVT prophylaxis: Eliquis Code Status: DNR Family Communication: Pt in room, family not at bedside Disposition Plan: Per primary service   Procedures:  Laparotomy with lysis of adhesions on 12/1 Sacral ulcer debridement on 12/1.  Antimicrobials: Anti-infectives    Start     Dose/Rate Route Frequency Ordered Stop   12/14/15 0600  clindamycin (CLEOCIN) IVPB 900 mg  Status:  Discontinued     900 mg 100 mL/hr over 30 Minutes Intravenous On call to O.R. 12/13/15 1305 12/14/15 1618   12/14/15 0600  gentamicin (GARAMYCIN) 310 mg in dextrose 5 % 100 mL IVPB  Status:  Discontinued     5 mg/kg  61.4 kg 107.8 mL/hr over 60 Minutes Intravenous On call to O.R. 12/13/15 1305 12/14/15 1618   12/03/15 0830  fluconazole (DIFLUCAN) tablet 150 mg     150 mg Oral  Once 12/03/15 0828 12/03/15 0859   12/02/15 2200  levofloxacin (LEVAQUIN) IVPB 500 mg  Status:  Discontinued     500  mg 100 mL/hr over 60 Minutes Intravenous Every 48 hours 11/30/15 2202 12/03/15 1036   11/30/15 2230  aztreonam (AZACTAM) 500 mg in dextrose 5 % 50 mL IVPB  Status:  Discontinued     500 mg 100 mL/hr over 30 Minutes Intravenous Every 8 hours 11/30/15 2203 12/01/15 0732   11/30/15 2145  levofloxacin (LEVAQUIN) IVPB 750 mg     750 mg 100 mL/hr over 90 Minutes  Intravenous  Once 11/30/15 2130 11/30/15 2327   11/30/15 2145  aztreonam (AZACTAM) 2 g in dextrose 5 % 50 mL IVPB  Status:  Discontinued     2 g 100 mL/hr over 30 Minutes Intravenous  Once 11/30/15 2130 11/30/15 2203      Subjective: Without complaints  Objective: Vitals:   12/19/15 2122 12/20/15 0500 12/20/15 0530 12/20/15 1342  BP: 101/62  (!) 100/57 115/64  Pulse: 62  72 74  Resp: 20  18 18   Temp: 98.4 F (36.9 C)  98 F (36.7 C) 98.6 F (37 C)  TempSrc: Oral  Oral Oral  SpO2: 100%  99% 99%  Weight:  63.2 kg (139 lb 5.3 oz)    Height:        Intake/Output Summary (Last 24 hours) at 12/20/15 1547 Last data filed at 12/20/15 1333  Gross per 24 hour  Intake              630 ml  Output             1657 ml  Net            -1027 ml   Filed Weights   12/18/15 0441 12/19/15 0500 12/20/15 0500  Weight: 63.5 kg (139 lb 15.9 oz) 63.2 kg (139 lb 5.3 oz) 63.2 kg (139 lb 5.3 oz)    Examination:  General exam: Laying in bed, in nad Respiratory system: poor inspiratory effort bilaterally, no audible wheezing Cardiovascular system: regular rate, s1, s2 Gastrointestinal system: soft, non distended, pos BS Central nervous system: CN 2-12 grossly intact, strength intact Extremities: no clubbing, no cyanosis Skin: normal skin turgor, no pallor Psychiatry: Mood normal// no visual hallucinations   Data Reviewed: I have personally reviewed following labs and imaging studies  CBC:  Recent Labs Lab 12/15/15 0359  12/17/15 0501 12/18/15 0438 12/18/15 0535 12/19/15 0401 12/20/15 0410  WBC 8.1  < > 7.2 QUESTIONABLE RESULTS, RECOMMEND RECOLLECT TO VERIFY 6.1 6.1 6.0  NEUTROABS 6.6  --  5.6  --   --   --   --   HGB 11.5*  < > 9.7* QUESTIONABLE RESULTS, RECOMMEND RECOLLECT TO VERIFY 8.9* 9.3* 8.9*  HCT 32.4*  < > 27.1* QUESTIONABLE RESULTS, RECOMMEND RECOLLECT TO VERIFY 25.2* 26.0* 25.5*  MCV 82.0  < > 81.6 QUESTIONABLE RESULTS, RECOMMEND RECOLLECT TO VERIFY 80.8 80.2 82.0  PLT  146*  < > 150 QUESTIONABLE RESULTS, RECOMMEND RECOLLECT TO VERIFY 157 193 217  < > = values in this interval not displayed. Basic Metabolic Panel:  Recent Labs Lab 12/15/15 0359 12/16/15 0336 12/17/15 0501 12/18/15 0535 12/19/15 0401 12/20/15 0410  NA 134* 132* 133* 135 136 135  K 4.2 3.9 3.3* 3.8 4.9 3.6  CL 106 105 104 108 108 108  CO2 21* 20* 21* 22 22 22   GLUCOSE 147* 140* 125* 131* 100* 89  BUN 23* 22* 22* 26* 25* 22*  CREATININE 1.28* 1.21* 1.14* 0.92 0.91 0.89  CALCIUM 8.1* 8.3* 8.4* 8.5* 7.1* 8.4*  MG 1.5* 1.9 1.8 1.8 1.4* 1.7  PHOS 3.5 3.2 3.4 3.4 3.1  --    GFR: Estimated Creatinine Clearance: 45.3 mL/min (by C-G formula based on SCr of 0.89 mg/dL). Liver Function Tests:  Recent Labs Lab 12/15/15 0359 12/16/15 0336 12/17/15 0501 12/18/15 0535  AST 16 13* 12* 15  ALT 12* 11* 11* 12*  ALKPHOS 53 54 54 46  BILITOT 1.4* 0.6 0.7 0.4  PROT 5.6* 5.5* 5.5* 5.2*  ALBUMIN 2.6* 2.4* 2.4* 2.3*   No results for input(s): LIPASE, AMYLASE in the last 168 hours. No results for input(s): AMMONIA in the last 168 hours. Coagulation Profile: No results for input(s): INR, PROTIME in the last 168 hours. Cardiac Enzymes: No results for input(s): CKTOTAL, CKMB, CKMBINDEX, TROPONINI in the last 168 hours. BNP (last 3 results) No results for input(s): PROBNP in the last 8760 hours. HbA1C: No results for input(s): HGBA1C in the last 72 hours. CBG:  Recent Labs Lab 12/18/15 1211 12/18/15 1829 12/18/15 2342 12/19/15 1126 12/19/15 2336  GLUCAP 115* 101* 95 85 81   Lipid Profile: No results for input(s): CHOL, HDL, LDLCALC, TRIG, CHOLHDL, LDLDIRECT in the last 72 hours. Thyroid Function Tests: No results for input(s): TSH, T4TOTAL, FREET4, T3FREE, THYROIDAB in the last 72 hours. Anemia Panel: No results for input(s): VITAMINB12, FOLATE, FERRITIN, TIBC, IRON, RETICCTPCT in the last 72 hours. Sepsis Labs: No results for input(s): PROCALCITON, LATICACIDVEN in the last 168  hours.  No results found for this or any previous visit (from the past 240 hour(s)).   Radiology Studies: Dg Chest Port 1 View  Result Date: 12/20/2015 CLINICAL DATA:  Cough, congestion EXAM: PORTABLE CHEST 1 VIEW COMPARISON:  12/02/2015 FINDINGS: Borderline cardiomegaly. Mild elevation of the right hemidiaphragm. No infiltrate or pulmonary edema. Mild infrahilar bronchitic changes. Large hiatal hernia is noted. Stable left basilar scarring. Right PICC line is unchanged in position. IMPRESSION: No infiltrate or pulmonary edema. Infrahilar bronchitic changes. Large hiatal hernia. Electronically Signed   By: Natasha MeadLiviu  Pop M.D.   On: 12/20/2015 12:09    Scheduled Meds: . amiodarone  200 mg Oral Daily  . apixaban  2.5 mg Oral BID  . chlorhexidine  15 mL Mouth Rinse BID  . feeding supplement  1 Container Oral BID BM  . feeding supplement (PRO-STAT SUGAR FREE 64)  30 mL Oral BID  . latanoprost  1 drop Both Eyes QHS  . levETIRAcetam  750 mg Intravenous Q12H  . lip balm  1 application Topical BID  . mouth rinse  15 mL Mouth Rinse q12n4p  . multivitamin  15 mL Oral Daily  . MUSCLE RUB   Topical QHS  . pantoprazole (PROTONIX) IV  40 mg Intravenous Q12H  . sodium chloride flush  10-40 mL Intracatheter Q12H  . sodium chloride flush  3 mL Intravenous Q12H   Continuous Infusions:    LOS: 20 days   Datha Kissinger, Scheryl MartenSTEPHEN K, MD [facility] Pager [phone]  If 7PM-7AM, please contact night-coverage [email or web address] Password [email or web address] 12/20/2015, 3:47 PM

## 2015-12-20 NOTE — Clinical Social Work Note (Signed)
Patient admitted from [facility] with plans to return once medically stable for discharge.   MSW remains available as needed.   Derenda Fennel, MSW [phone] 12/20/2015 1:46 PM

## 2015-12-20 NOTE — Progress Notes (Signed)
Patient ID: Brandi Heath, female   DOB: [birth year], 82 y.o.   MRN: [id number]  [facility]  6 Days Post-Op  Subjective: Denies any abdominal pain today. Tolerating clear liquids. Passing flatus and had multiple BM's yesterday. Denies n/v.  Objective: Vital signs in last 24 hours: Temp:  [97.9 F (36.6 C)-98.4 F (36.9 C)] 98 F (36.7 C) (12/07 0530) Pulse Rate:  [57-72] 72 (12/07 0530) Resp:  [18-20] 18 (12/07 0530) BP: (100-110)/(57-67) 100/57 (12/07 0530) SpO2:  [99 %-100 %] 99 % (12/07 0530) Weight:  [139 lb 5.3 oz (63.2 kg)] 139 lb 5.3 oz (63.2 kg) (12/07 0500) Last BM Date: 12/19/15  Intake/Output from previous day: 12/06 0701 - 12/07 0700 In: 592.5 [P.O.:240; I.V.:30; IV Piggyback:322.5] Out: 1057 [Urine:1050; Stool:7] Intake/Output this shift: Total I/O In: 360 [P.O.:360] Out: 450 [Urine:450]  PE: Gen:  Alert, NAD, pleasant Card:  irregular Pulm:  CTAB, effort normal, few scattered rhonchi Abd: Soft, NT/ND, present but hypoactive BS, midline abdominal incision C/D/I with staples intact >> honeycomb dressing removed Sacrum:     Lab Results:   Recent Labs  12/19/15 0401 12/20/15 0410  WBC 6.1 6.0  HGB 9.3* 8.9*  HCT 26.0* 25.5*  PLT 193 217   BMET  Recent Labs  12/19/15 0401 12/20/15 0410  NA 136 135  K 4.9 3.6  CL 108 108  CO2 22 22  GLUCOSE 100* 89  BUN 25* 22*  CREATININE 0.91 0.89  CALCIUM 7.1* 8.4*   PT/INR No results for input(s): LABPROT, INR in the last 72 hours. CMP     Component Value Date/Time   NA 135 12/20/2015 0410   NA 142 10/30/2015   K 3.6 12/20/2015 0410   CL 108 12/20/2015 0410   CO2 22 12/20/2015 0410   GLUCOSE 89 12/20/2015 0410   BUN 22 (H) 12/20/2015 0410   BUN 9 10/30/2015   CREATININE 0.89 12/20/2015 0410   CALCIUM 8.4 (L) 12/20/2015 0410   PROT 5.2 (L) 12/18/2015 0535   ALBUMIN 2.3 (L) 12/18/2015 0535   AST 15 12/18/2015 0535   ALT 12 (L) 12/18/2015 0535   ALKPHOS 46 12/18/2015  0535   BILITOT 0.4 12/18/2015 0535   GFRNONAA 57 (L) 12/20/2015 0410   GFRAA >60 12/20/2015 0410   Lipase     Component Value Date/Time   LIPASE 37 12/03/2015 0645       Studies/Results: No results found.  Anti-infectives: Anti-infectives    Start     Dose/Rate Route Frequency Ordered Stop   12/14/15 0600  clindamycin (CLEOCIN) IVPB 900 mg  Status:  Discontinued     900 mg 100 mL/hr over 30 Minutes Intravenous On call to O.R. 12/13/15 1305 12/14/15 1618   12/14/15 0600  gentamicin (GARAMYCIN) 310 mg in dextrose 5 % 100 mL IVPB  Status:  Discontinued     5 mg/kg  61.4 kg 107.8 mL/hr over 60 Minutes Intravenous On call to O.R. 12/13/15 1305 12/14/15 1618   12/03/15 0830  fluconazole (DIFLUCAN) tablet 150 mg     150 mg Oral  Once 12/03/15 0828 12/03/15 0859   12/02/15 2200  levofloxacin (LEVAQUIN) IVPB 500 mg  Status:  Discontinued     500 mg 100 mL/hr over 60 Minutes Intravenous Every 48 hours 11/30/15 2202 12/03/15 1036   11/30/15 2230  aztreonam (AZACTAM) 500 mg in dextrose 5 % 50 mL IVPB  Status:  Discontinued     500 mg 100 mL/hr over 30 Minutes Intravenous Every 8  hours 11/30/15 2203 12/01/15 0732   11/30/15 2145  levofloxacin (LEVAQUIN) IVPB 750 mg     750 mg 100 mL/hr over 90 Minutes Intravenous  Once 11/30/15 2130 11/30/15 2327   11/30/15 2145  aztreonam (AZACTAM) 2 g in dextrose 5 % 50 mL IVPB  Status:  Discontinued     2 g 100 mL/hr over 30 Minutes Intravenous  Once 11/30/15 2130 11/30/15 2203       Assessment/Plan Recurrent SBO (small bowel obstruction) S/p EXPLORATORY LAPAROTOMY,  LYSIS OF ADHESIONS, DEBRIDMENT OF SACRAL DECUBITUS ULCER, skin subcutaneous tissue and fascia 6 x 6 cm 12/1 Dr. Johna Sheriff - POD 6 - WBC WNL, afebrile - passing flatus and had multiple BM's   Atrial fibrillation  Severe protein calorie malnutrition Dementia  VTE - Eliquis FEN - clear liquid diet  Plan - bowel function returning. Advance to full liquid diet. Consult PT for  mobility. Continue BID wet to dry dressing changes to sacral wound.    LOS: 20 days    [name] , [facility] 12/20/2015, 12:09 PM Pager: [phone] Consults: [phone] Mon-Fri 7:00 am-4:30 pm Sat-Sun 7:00 am-11:30 am

## 2015-12-20 NOTE — Care Management Important Message (Signed)
Important Message  Patient Details  Name: Brandi Heath MRN: [id number] Date of Birth: 1927/04/27   Medicare Important Message Given:  Yes    Haskell Flirt 12/20/2015, 12:25 PMImportant Message  Patient Details  Name: Brandi Heath MRN: [id number] Date of Birth: [birth year]   Medicare Important Message Given:  Yes    Haskell Flirt 12/20/2015, 12:25 PM

## 2015-12-21 DIAGNOSIS — D638 Anemia in other chronic diseases classified elsewhere: Secondary | ICD-10-CM

## 2015-12-21 LAB — COMPREHENSIVE METABOLIC PANEL
ALK PHOS: 48 U/L (ref 38–126)
ALT: 10 U/L — AB (ref 14–54)
AST: 13 U/L — AB (ref 15–41)
Albumin: 2.1 g/dL — ABNORMAL LOW (ref 3.5–5.0)
Anion gap: 4 — ABNORMAL LOW (ref 5–15)
BILIRUBIN TOTAL: 0.6 mg/dL (ref 0.3–1.2)
BUN: 21 mg/dL — AB (ref 6–20)
CALCIUM: 8.2 mg/dL — AB (ref 8.9–10.3)
CO2: 23 mmol/L (ref 22–32)
CREATININE: 1.02 mg/dL — AB (ref 0.44–1.00)
Chloride: 111 mmol/L (ref 101–111)
GFR, EST AFRICAN AMERICAN: 56 mL/min — AB (ref >60)
GFR, EST NON AFRICAN AMERICAN: 48 mL/min — AB (ref >60)
Glucose, Bld: 97 mg/dL (ref 65–99)
Potassium: 3.8 mmol/L (ref 3.5–5.1)
Sodium: 138 mmol/L (ref 135–145)
TOTAL PROTEIN: 5.1 g/dL — AB (ref 6.5–8.1)

## 2015-12-21 LAB — CBC
HEMATOCRIT: 24.2 % — AB (ref 36.0–46.0)
Hemoglobin: 8.5 g/dL — ABNORMAL LOW (ref 12.0–15.0)
MCH: 29 pg (ref 26.0–34.0)
MCHC: 35.1 g/dL (ref 30.0–36.0)
MCV: 82.6 fL (ref 78.0–100.0)
Platelets: 233 10*3/uL (ref 150–400)
RBC: 2.93 MIL/uL — AB (ref 3.87–5.11)
RDW: 16 % — ABNORMAL HIGH (ref 11.5–15.5)
WBC: 6.9 10*3/uL (ref 4.0–10.5)

## 2015-12-21 LAB — GLUCOSE, CAPILLARY
GLUCOSE-CAPILLARY: 106 mg/dL — AB (ref 65–99)
Glucose-Capillary: 101 mg/dL — ABNORMAL HIGH (ref 65–99)
Glucose-Capillary: 95 mg/dL (ref 65–99)

## 2015-12-21 MED ORDER — BOOST / RESOURCE BREEZE PO LIQD
1.0000 | Freq: Three times a day (TID) | ORAL | Status: DC
  Administered 2015-12-22 – 2015-12-25 (×7): 1 via ORAL

## 2015-12-21 MED ORDER — PANTOPRAZOLE SODIUM 40 MG PO TBEC
40.0000 mg | DELAYED_RELEASE_TABLET | Freq: Every day | ORAL | Status: DC
  Administered 2015-12-21 – 2015-12-25 (×5): 40 mg via ORAL
  Filled 2015-12-21 (×5): qty 1

## 2015-12-21 MED ORDER — PRO-STAT SUGAR FREE PO LIQD
30.0000 mL | Freq: Three times a day (TID) | ORAL | Status: DC
  Administered 2015-12-22 – 2015-12-25 (×8): 30 mL via ORAL
  Filled 2015-12-21 (×8): qty 30

## 2015-12-21 MED ORDER — DULOXETINE HCL 60 MG PO CPEP
60.0000 mg | ORAL_CAPSULE | Freq: Every day | ORAL | Status: DC
  Administered 2015-12-21 – 2015-12-24 (×4): 60 mg via ORAL
  Filled 2015-12-21 (×4): qty 1

## 2015-12-21 MED ORDER — LACTATED RINGERS IV BOLUS (SEPSIS)
1000.0000 mL | Freq: Once | INTRAVENOUS | Status: AC
  Administered 2015-12-21: 1000 mL via INTRAVENOUS

## 2015-12-21 MED ORDER — DONEPEZIL HCL 10 MG PO TABS
10.0000 mg | ORAL_TABLET | Freq: Every day | ORAL | Status: DC
  Administered 2015-12-21 – 2015-12-24 (×4): 10 mg via ORAL
  Filled 2015-12-21 (×4): qty 1

## 2015-12-21 MED ORDER — VITAMIN C 500 MG PO TABS
500.0000 mg | ORAL_TABLET | Freq: Every day | ORAL | Status: DC
  Administered 2015-12-21 – 2015-12-25 (×5): 500 mg via ORAL
  Filled 2015-12-21 (×5): qty 1

## 2015-12-21 MED ORDER — KCL IN DEXTROSE-NACL 20-5-0.45 MEQ/L-%-% IV SOLN
INTRAVENOUS | Status: DC
  Administered 2015-12-21: 06:00:00 via INTRAVENOUS
  Filled 2015-12-21: qty 1000

## 2015-12-21 MED ORDER — LABETALOL HCL 5 MG/ML IV SOLN
5.0000 mg | INTRAVENOUS | Status: DC | PRN
  Filled 2015-12-21: qty 4

## 2015-12-21 NOTE — Progress Notes (Signed)
Notified MD of patient's BP 75/40, pulse 112 and decreased PO intake overnight. Received orders to start dextrose 5% in 0.45 NS with of KCL at the rate of 11mL/hr and see how it affects morning VS.  Will cont to monitor and treat.

## 2015-12-21 NOTE — Progress Notes (Signed)
Nutrition Follow-up  DOCUMENTATION CODES:   Severe malnutrition in context of acute illness/injury, Underweight  INTERVENTION:  Continue diet advancement per Surgery.   Increase to Boost Breeze po TID, each supplement provides 250 kcal and 9 grams of protein. Also changed to be given with meals.  Increase to Pro-Stat 30 ml TID, each supplement provides 100 kcal and 15 grams of protein. Mixed in patient's beverage of choice.  Continue daily liquid multivitamin with minerals.  Encouraged PO intake of meals and supplements to patient. Discussed importance of adequate calories and protein to promote wound healing.  NUTRITION DIAGNOSIS:   Inadequate oral intake related to inability to eat as evidenced by NPO status.  Progressing as patient no longer NPO, yet intake remains inadequate.  GOAL:   Patient will meet greater than or equal to 90% of their needs  Not met.  MONITOR:   PO intake, Supplement acceptance, Diet advancement, Weight trends, Labs, Skin, I & O's  REASON FOR ASSESSMENT:   Consult New TPN/TNA  ASSESSMENT:   82 y.o. female with medical history significant of  CAD, dementia, DVT, OSA, D, atrial fibrillation on anticoagulation, recurrent small bowel obstruction, CHF nonischemic cardiomyopathy with mitral regurgitation, seizure disorder, recent stroke, As with abdominal pain, vomiting and altered mental status.  -Patient s/p laparotomy with lysis of adhesions on 12/1. -TPN was discontinued 12/5. Diet advanced to CLD on 12/5 and FLD on 12/7.  Spoke with patient at bedside. She reports good appetite. Denies N/V or abdominal pain. Per NT patient had several loose bowel movements yesterday. Per NT patient cannot have any chocolate or lactose - verified in chart. This will limit the amount of protein patient can have from [place] as most options (yogurt, milk, etc) contain dairy. Patient had both Pro-Stat supplements yesterday, but did not have either Boost Breeze ordered  (reason being patient had just ate - likely told RN she was too full).  Meal Completion: </= 25% per chart review and discussion with RN and NT.  In the past 24 hrs patient has had approximately 487 kcal (36% minimum estimated kcal needs) and 36 grams protein (51% minimum estimated protein needs).   Medications reviewed and include: liquid multivitamin with minerals daily, pantoprazole, vitamin C 500 mg daily.  Labs reviewed: CBG 81-85 past 24 hrs, BUN 21, Creatinine 1.02, Anion gap 4, Albumin 2.1, AST 13, ALT 10, Total Protein 5.1.   Discussed with RN. Will order Boost Breeze to be served with tray so patient can drink [employer] as her drink for the meal - will hopefully increase intake.  Diet Order:  Diet full liquid Room service appropriate? Yes; Fluid consistency: Thin  Skin:  Wound (see comment) (Unstageable, full thickness sacral pressure injury)  Last BM:  12/20/2015  Height:   Ht Readings from Last 1 Encounters:  12/01/15 5' 9"  (1.753 m)    Weight:   Wt Readings from Last 1 Encounters:  12/21/15 168 lb 3.4 oz (76.3 kg)    Ideal Body Weight:  65.9 kg  BMI:  Body mass index is 24.84 kg/m.  Estimated Nutritional Needs:   Kcal:  1350-1600  Protein:  70-80g  Fluid:  1.5-1.7L/day  EDUCATION NEEDS:   No education needs identified at this time  Willey Blade, MS, RD, LDN Pager: [phone] After Hours Pager: [phone]

## 2015-12-21 NOTE — Progress Notes (Signed)
Bailey Mech PA made aware of blood pressure of 80/46 heart rate of 112.  Held Keppra and will discuss at 10 am if Pacerone needed.  Will continue to monitor

## 2015-12-21 NOTE — Progress Notes (Signed)
PROGRESS NOTE    Brandi Heath  ZOX:[id number] DOB: Nov 28, 1927 DOA: 11/30/2015 PCP: Merrilee Seashore, MD    Brief Narrative:  82 year old female with history of A. 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  Patient having recurrent small bowel obstructions (4 episodes over the last few weeks). Surgery consulted and patient taken to or on 12/1For laparotomy and lysis of adhesions.  stepdown unit postop.  Assessment & Plan:   Principal Problem:   SBO (small bowel obstruction) Active Problems:   OSA (obstructive sleep apnea)   Dementia with behavioral disturbance   Hypertensive heart disease with CHF (congestive heart failure) (HCC)   Atrial fibrillation with rapid ventricular response (HCC)   Acute encephalopathy   UTI (urinary tract infection)   History of CVA (cerebrovascular accident)   GERD (gastroesophageal reflux disease)   Anemia, chronic disease   Non-ischemic cardiomyopathy (HCC)   MR (mitral regurgitation)   Acute kidney injury (HCC)   Acute renal failure (ARF) (HCC)   Dehydration   Pressure injury of skin   Protein-calorie malnutrition, severe   Palliative care encounter   Goals of care, counseling/discussion   Chronic anticoagulation   Encounter for hospice care discussion   Hypokalemia   Hypomagnesemia   S/P exploratory laparotomy   Hypotension   Atrial fibrillation with RVR (HCC)    Principal Problem:   Recurrent SBO (small bowel obstruction) - patient with recurrent SBO, s/p laparotomy w/ lysis of adhesions on 12/1 - Pt reportedly tolerating PO. Advancing diet. - Multiple bowel movements reported overnight  Active Problems:   Atrial fibrillation with RVR (HCC) - Remains off cardizem gtt secondary to low BP - Pt had been seen by Cardiology. Recent recommendations noted for continued amiodarone - On eliquis for secondary stroke  prevention - Overnight, patient noted to be hypotensive, requiring IVF boluses (see below). Several doses of amiodarone have been held consequently - HR has increased as high as 130's - Discussed with pharmacy. Per pharmacy, PO amiodarone has roughly 1% risk of associated hypotension. Recommend continued amiodarone as tolerated - Will add IV labetalol PRN with hold parameters - Consider tele given labile HR  Hypotension - BP noted to be low overnight - Patient reported to have over 10 large bowel movements prior. Suspect dehydration, thus agree with cautious hydration as tolerated - Most recent BP of 107/80 noted  Sacral decubitus ulcer - s/p debridement - Continue per surgery  Seizure disorder - Continue dilantin as scheduled - Remains stable  Acute kidney injury - Suspect secondary to dehydration - Cr slightly higher today, suspect dehydration - IVF being given  Hypokalemia/hypomagnesemia - Electrolytes reviewed and are unremarkable - Repeat bmet in AM  Severe protein calorie malnutrition - Nutrition consulted  Anemia of chronic disease - hgb of 8.5. Transfuse if <7  Moderate  dementia - remains stable at this time  SOB - stable at present - Recent CXR reviewed and is unremarkable  Chronic systolic CHF - Most recent EF noted to be around 40-45% - No signs of gross volume overload today - Continue with cautious IVF hydration as tolerated  DVT prophylaxis: Eliquis Code Status: DNR Family Communication: Pt in room, family not at bedside Disposition Plan: Per primary service   Procedures:  Laparotomy with lysis of adhesions on 12/1 Sacral ulcer debridement on 12/1.  Antimicrobials: Anti-infectives    Start     Dose/Rate Route Frequency Ordered Stop   12/14/15  0600  clindamycin (CLEOCIN) IVPB 900 mg  Status:  Discontinued     900 mg 100 mL/hr over 30 Minutes Intravenous On call to O.R. 12/13/15 1305 12/14/15 1618   12/14/15 0600  gentamicin  (GARAMYCIN) 310 mg in dextrose 5 % 100 mL IVPB  Status:  Discontinued     5 mg/kg  61.4 kg 107.8 mL/hr over 60 Minutes Intravenous On call to O.R. 12/13/15 1305 12/14/15 1618   12/03/15 0830  fluconazole (DIFLUCAN) tablet 150 mg     150 mg Oral  Once 12/03/15 0828 12/03/15 0859   12/02/15 2200  levofloxacin (LEVAQUIN) IVPB 500 mg  Status:  Discontinued     500 mg 100 mL/hr over 60 Minutes Intravenous Every 48 hours 11/30/15 2202 12/03/15 1036   11/30/15 2230  aztreonam (AZACTAM) 500 mg in dextrose 5 % 50 mL IVPB  Status:  Discontinued     500 mg 100 mL/hr over 30 Minutes Intravenous Every 8 hours 11/30/15 2203 12/01/15 0732   11/30/15 2145  levofloxacin (LEVAQUIN) IVPB 750 mg     750 mg 100 mL/hr over 90 Minutes Intravenous  Once 11/30/15 2130 11/30/15 2327   11/30/15 2145  aztreonam (AZACTAM) 2 g in dextrose 5 % 50 mL IVPB  Status:  Discontinued     2 g 100 mL/hr over 30 Minutes Intravenous  Once 11/30/15 2130 11/30/15 2203      Subjective: No complaints  Objective: Vitals:   12/21/15 1148 12/21/15 1253 12/21/15 1522 12/21/15 1756  BP: (!) 82/52 (!) 81/51 107/80 (!) 92/54  Pulse: 98 93 (!) 134 87  Resp:   18   Temp:   98.7 F (37.1 C)   TempSrc:   Oral   SpO2:   98%   Weight:      Height:        Intake/Output Summary (Last 24 hours) at 12/21/15 1802 Last data filed at 12/21/15 1757  Gross per 24 hour  Intake           1827.5 ml  Output             1025 ml  Net            802.5 ml   Filed Weights   12/20/15 0500 12/21/15 0700 12/21/15 0933  Weight: 63.2 kg (139 lb 5.3 oz) 76.1 kg (167 lb 12.3 oz) 76.3 kg (168 lb 3.4 oz)    Examination:  General exam: sitting in chair, in nad Respiratory system: normal chest rise, no wheezing Cardiovascular system: tachycardic, s1, s2 Gastrointestinal system: pos BS, no masses, nondistended Central nervous system: no seizures, no tremors Extremities: no joint deformities, perfused Skin: no notable skin lesions seen, no  rashes Psychiatry: affect normal// no auditory hallucinations   Data Reviewed: I have personally reviewed following labs and imaging studies  CBC:  Recent Labs Lab 12/15/15 0359  12/17/15 0501 12/18/15 0438 12/18/15 0535 12/19/15 0401 12/20/15 0410 12/21/15 0446  WBC 8.1  < > 7.2 QUESTIONABLE RESULTS, RECOMMEND RECOLLECT TO VERIFY 6.1 6.1 6.0 6.9  NEUTROABS 6.6  --  5.6  --   --   --   --   --   HGB 11.5*  < > 9.7* QUESTIONABLE RESULTS, RECOMMEND RECOLLECT TO VERIFY 8.9* 9.3* 8.9* 8.5*  HCT 32.4*  < > 27.1* QUESTIONABLE RESULTS, RECOMMEND RECOLLECT TO VERIFY 25.2* 26.0* 25.5* 24.2*  MCV 82.0  < > 81.6 QUESTIONABLE RESULTS, RECOMMEND RECOLLECT TO VERIFY 80.8 80.2 82.0 82.6  PLT 146*  < > 150  QUESTIONABLE RESULTS, RECOMMEND RECOLLECT TO VERIFY 157 193 217 233  < > = values in this interval not displayed. Basic Metabolic Panel:  Recent Labs Lab 12/15/15 0359 12/16/15 0336 12/17/15 0501 12/18/15 0535 12/19/15 0401 12/20/15 0410 12/21/15 0446  NA 134* 132* 133* 135 136 135 138  K 4.2 3.9 3.3* 3.8 4.9 3.6 3.8  CL 106 105 104 108 108 108 111  CO2 21* 20* 21* 22 22 22 23   GLUCOSE 147* 140* 125* 131* 100* 89 97  BUN 23* 22* 22* 26* 25* 22* 21*  CREATININE 1.28* 1.21* 1.14* 0.92 0.91 0.89 1.02*  CALCIUM 8.1* 8.3* 8.4* 8.5* 7.1* 8.4* 8.2*  MG 1.5* 1.9 1.8 1.8 1.4* 1.7  --   PHOS 3.5 3.2 3.4 3.4 3.1  --   --    GFR: Estimated Creatinine Clearance: 41.4 mL/min (by C-G formula based on SCr of 1.02 mg/dL (H)). Liver Function Tests:  Recent Labs Lab 12/15/15 0359 12/16/15 0336 12/17/15 0501 12/18/15 0535 12/21/15 0446  AST 16 13* 12* 15 13*  ALT 12* 11* 11* 12* 10*  ALKPHOS 53 54 54 46 48  BILITOT 1.4* 0.6 0.7 0.4 0.6  PROT 5.6* 5.5* 5.5* 5.2* 5.1*  ALBUMIN 2.6* 2.4* 2.4* 2.3* 2.1*   No results for input(s): LIPASE, AMYLASE in the last 168 hours. No results for input(s): AMMONIA in the last 168 hours. Coagulation Profile: No results for input(s): INR, PROTIME in the  last 168 hours. Cardiac Enzymes: No results for input(s): CKTOTAL, CKMB, CKMBINDEX, TROPONINI in the last 168 hours. BNP (last 3 results) No results for input(s): PROBNP in the last 8760 hours. HbA1C: No results for input(s): HGBA1C in the last 72 hours. CBG:  Recent Labs Lab 12/18/15 2342 12/19/15 1126 12/19/15 2336 12/21/15 1158 12/21/15 1741  GLUCAP 95 85 81 106* 95   Lipid Profile: No results for input(s): CHOL, HDL, LDLCALC, TRIG, CHOLHDL, LDLDIRECT in the last 72 hours. Thyroid Function Tests: No results for input(s): TSH, T4TOTAL, FREET4, T3FREE, THYROIDAB in the last 72 hours. Anemia Panel: No results for input(s): VITAMINB12, FOLATE, FERRITIN, TIBC, IRON, RETICCTPCT in the last 72 hours. Sepsis Labs: No results for input(s): PROCALCITON, LATICACIDVEN in the last 168 hours.  No results found for this or any previous visit (from the past 240 hour(s)).   Radiology Studies: Dg Chest Port 1 View  Result Date: 12/20/2015 CLINICAL DATA:  Cough, congestion EXAM: PORTABLE CHEST 1 VIEW COMPARISON:  12/02/2015 FINDINGS: Borderline cardiomegaly. Mild elevation of the right hemidiaphragm. No infiltrate or pulmonary edema. Mild infrahilar bronchitic changes. Large hiatal hernia is noted. Stable left basilar scarring. Right PICC line is unchanged in position. IMPRESSION: No infiltrate or pulmonary edema. Infrahilar bronchitic changes. Large hiatal hernia. Electronically Signed   By: Natasha Mead M.D.   On: 12/20/2015 12:09    Scheduled Meds: . amiodarone  200 mg Oral Daily  . apixaban  2.5 mg Oral BID  . chlorhexidine  15 mL Mouth Rinse BID  . donepezil  10 mg Oral QHS  . DULoxetine  60 mg Oral QHS  . feeding supplement  1 Container Oral TID WC  . feeding supplement (PRO-STAT SUGAR FREE 64)  30 mL Oral TID BM  . latanoprost  1 drop Both Eyes QHS  . levETIRAcetam  750 mg Intravenous Q12H  . lip balm  1 application Topical BID  . mouth rinse  15 mL Mouth Rinse q12n4p  .  multivitamin  15 mL Oral Daily  . MUSCLE RUB  Topical QHS  . pantoprazole  40 mg Oral Daily  . sodium chloride flush  10-40 mL Intracatheter Q12H  . sodium chloride flush  3 mL Intravenous Q12H  . vitamin C  500 mg Oral Daily   Continuous Infusions:    LOS: 21 days   CHIU, Scheryl MartenSTEPHEN K, MD [facility] Pager [phone]  If 7PM-7AM, please contact night-coverage [email or web address] Password [email or web address] 12/21/2015, 6:02 PM

## 2015-12-21 NOTE — Progress Notes (Signed)
[facility] Progress Note  7 Days Post-Op  Subjective: Hypotensive this morning 80/42 and tachycardic 111 bpm - 1,000 cc fluid bolus ordered.   Denies abdominal pain. Tolerating full liquids. Having bowel movements.   Objective: Vital signs in last 24 hours: Temp:  [98.6 F (37 C)-99.4 F (37.4 C)] 98.9 F (37.2 C) (12/08 0524) Pulse Rate:  [74-116] 98 (12/08 1148) Resp:  [14-18] 16 (12/08 0837) BP: (75-115)/(40-64) 82/52 (12/08 1148) SpO2:  [98 %-99 %] 98 % (12/08 0837) Weight:  [167 lb 12.3 oz (76.1 kg)-168 lb 3.4 oz (76.3 kg)] 168 lb 3.4 oz (76.3 kg) (12/08 0933) Last BM Date: 12/19/15  Intake/Output from previous day: 12/07 0701 - 12/08 0700 In: 947.5 [P.O.:840; IV Piggyback:107.5] Out: 1050 [Urine:1050] Intake/Output this shift: Total I/O In: 240 [P.O.:240] Out: 225 [Urine:225]  PE: Gen:  Alert, NAD, pleasant Card:  irregular Pulm:  CTAB, unlabored, few scattered rhonchi Abd: Soft, NT/ND, + BS, midline abdominal incision C/D/I with staples intact   Lab Results:   Recent Labs  12/20/15 0410 12/21/15 0446  WBC 6.0 6.9  HGB 8.9* 8.5*  HCT 25.5* 24.2*  PLT 217 233   BMET  Recent Labs  12/20/15 0410 12/21/15 0446  NA 135 138  K 3.6 3.8  CL 108 111  CO2 22 23  GLUCOSE 89 97  BUN 22* 21*  CREATININE 0.89 1.02*  CALCIUM 8.4* 8.2*   PT/INR No results for input(s): LABPROT, INR in the last 72 hours. CMP     Component Value Date/Time   NA 138 12/21/2015 0446   NA 142 10/30/2015   K 3.8 12/21/2015 0446   CL 111 12/21/2015 0446   CO2 23 12/21/2015 0446   GLUCOSE 97 12/21/2015 0446   BUN 21 (H) 12/21/2015 0446   BUN 9 10/30/2015   CREATININE 1.02 (H) 12/21/2015 0446   CALCIUM 8.2 (L) 12/21/2015 0446   PROT 5.1 (L) 12/21/2015 0446   ALBUMIN 2.1 (L) 12/21/2015 0446   AST 13 (L) 12/21/2015 0446   ALT 10 (L) 12/21/2015 0446   ALKPHOS 48 12/21/2015 0446   BILITOT 0.6 12/21/2015 0446   GFRNONAA 48 (L) 12/21/2015 0446   GFRAA 56 (L)  12/21/2015 0446   Lipase     Component Value Date/Time   LIPASE 37 12/03/2015 0645       Studies/Results: Dg Chest Port 1 View  Result Date: 12/20/2015 CLINICAL DATA:  Cough, congestion EXAM: PORTABLE CHEST 1 VIEW COMPARISON:  12/02/2015 FINDINGS: Borderline cardiomegaly. Mild elevation of the right hemidiaphragm. No infiltrate or pulmonary edema. Mild infrahilar bronchitic changes. Large hiatal hernia is noted. Stable left basilar scarring. Right PICC line is unchanged in position. IMPRESSION: No infiltrate or pulmonary edema. Infrahilar bronchitic changes. Large hiatal hernia. Electronically Signed   By: Natasha MeadLiviu  Pop M.D.   On: 12/20/2015 12:09    Anti-infectives: Anti-infectives    Start     Dose/Rate Route Frequency Ordered Stop   12/14/15 0600  clindamycin (CLEOCIN) IVPB 900 mg  Status:  Discontinued     900 mg 100 mL/hr over 30 Minutes Intravenous On call to O.R. 12/13/15 1305 12/14/15 1618   12/14/15 0600  gentamicin (GARAMYCIN) 310 mg in dextrose 5 % 100 mL IVPB  Status:  Discontinued     5 mg/kg  61.4 kg 107.8 mL/hr over 60 Minutes Intravenous On call to O.R. 12/13/15 1305 12/14/15 1618   12/03/15 0830  fluconazole (DIFLUCAN) tablet 150 mg     150 mg Oral  Once 12/03/15  0211 12/03/15 0859   12/02/15 2200  levofloxacin (LEVAQUIN) IVPB 500 mg  Status:  Discontinued     500 mg 100 mL/hr over 60 Minutes Intravenous Every 48 hours 11/30/15 2202 12/03/15 1036   11/30/15 2230  aztreonam (AZACTAM) 500 mg in dextrose 5 % 50 mL IVPB  Status:  Discontinued     500 mg 100 mL/hr over 30 Minutes Intravenous Every 8 hours 11/30/15 2203 12/01/15 0732   11/30/15 2145  levofloxacin (LEVAQUIN) IVPB 750 mg     750 mg 100 mL/hr over 90 Minutes Intravenous  Once 11/30/15 2130 11/30/15 2327   11/30/15 2145  aztreonam (AZACTAM) 2 g in dextrose 5 % 50 mL IVPB  Status:  Discontinued     2 g 100 mL/hr over 30 Minutes Intravenous  Once 11/30/15 2130 11/30/15 2203     Assessment/Plan Recurrent  SBO (small bowel obstruction) S/p EXPLORATORY LAPAROTOMY,  LYSIS OF ADHESIONS, DEBRIDMENT OF SACRAL DECUBITUS ULCER, skin subcutaneoustissue and fascia 6 x 6 cm 12/1 Dr. Johna Sheriff - POD 7 - WBC WNL, afebrile - passing flatus and had multiple BM's   Hypotension - 80/46; mild tachycardia and increase in SCr (1.02) and leukocytosis; suspect secondary to dehydration 1 liter IV bolus, held AM dose keppra; appreciate internal medicine assistance/recommendations; amiodarone will be administered if systolic > 93.   Atrial fibrillation  Severe protein calorie malnutrition Dementia  VTE - Eliquis FEN - full liquid diet  Plan -  Advance to soft diet Tachycardia improving < 100bpm Monitor pressures- may require another bolus; appreciate medicine assistance/recommendations. Continue current wound care to sacral wound Encourage mobilization with PT once blood pressure/HR stable   LOS: 21 days    Adam Phenix , [facility] 12/21/2015, 12:34 PM Pager: [phone] Consults: [phone] Mon-Fri 7:00 am-4:30 pm Sat-Sun 7:00 am-11:30 am

## 2015-12-21 NOTE — Progress Notes (Signed)
ANTICOAGULATION CONSULT NOTE - Follow Up Consult  Pharmacy Consult for Eliquis Indication: atrial fibrillation, hx of stroke, hx of DVT/PE  Assessment: 66 yoF admitted with UTI/sepsis. PMH significant for a-fib, stroke, and DVT/PE on oral anticoagulation PTA with Eliquis, held for NPO status, SBO.  Pt transitioned to IV heparin 11/28.  S/p LOA, debridement of sacral decub ulcers on 12/1.  Pt tolerating PO medications, passing flatus with multiple BMs and on CLD.  Pt transitioned back to PO Eliquis on 12/5.    12/8:  Hgb low, trending down slowly.  Plts WNL.   SCr small bump.  No bleeding issues noted.    Goal of Therapy:  Monitor platelets by anticoagulation protocol: Yes   Plan:   Continue Eliquis 2.5mg  PO BID   Continue to monitor renal function, CBC, s/s bleeding  Haynes Hoehn, PharmD, BCPS 12/21/2015, 10:32 AM  Pager: [phone]

## 2015-12-21 NOTE — Progress Notes (Signed)
PT Cancellation Note  Patient Details Name: Brandi Heath MRN: [id number] DOB: 03-Sep-1927   Cancelled Treatment:    Reason Eval/Treat Not Completed: Medical issues which prohibited therapy (BP 80/46, HR elevated)   [facility] 12/21/2015, 9:02 AM

## 2015-12-22 LAB — MAGNESIUM: MAGNESIUM: 1.5 mg/dL — AB (ref 1.7–2.4)

## 2015-12-22 LAB — BASIC METABOLIC PANEL
Anion gap: 7 (ref 5–15)
BUN: 16 mg/dL (ref 6–20)
CALCIUM: 8.7 mg/dL — AB (ref 8.9–10.3)
CHLORIDE: 110 mmol/L (ref 101–111)
CO2: 21 mmol/L — ABNORMAL LOW (ref 22–32)
CREATININE: 0.9 mg/dL (ref 0.44–1.00)
GFR, EST NON AFRICAN AMERICAN: 56 mL/min — AB (ref >60)
Glucose, Bld: 115 mg/dL — ABNORMAL HIGH (ref 65–99)
Potassium: 3.7 mmol/L (ref 3.5–5.1)
SODIUM: 138 mmol/L (ref 135–145)

## 2015-12-22 LAB — CBC
HCT: 25.7 % — ABNORMAL LOW (ref 36.0–46.0)
Hemoglobin: 8.9 g/dL — ABNORMAL LOW (ref 12.0–15.0)
MCH: 28.6 pg (ref 26.0–34.0)
MCHC: 34.6 g/dL (ref 30.0–36.0)
MCV: 82.6 fL (ref 78.0–100.0)
PLATELETS: 310 10*3/uL (ref 150–400)
RBC: 3.11 MIL/uL — ABNORMAL LOW (ref 3.87–5.11)
RDW: 16.1 % — AB (ref 11.5–15.5)
WBC: 8 10*3/uL (ref 4.0–10.5)

## 2015-12-22 LAB — GLUCOSE, CAPILLARY: Glucose-Capillary: 101 mg/dL — ABNORMAL HIGH (ref 65–99)

## 2015-12-22 MED ORDER — SODIUM CHLORIDE 0.9 % IV BOLUS (SEPSIS)
250.0000 mL | Freq: Once | INTRAVENOUS | Status: AC
  Administered 2015-12-22: 250 mL via INTRAVENOUS

## 2015-12-22 MED ORDER — POTASSIUM CHLORIDE CRYS ER 20 MEQ PO TBCR
40.0000 meq | EXTENDED_RELEASE_TABLET | Freq: Once | ORAL | Status: AC
Start: 2015-12-22 — End: 2015-12-22
  Administered 2015-12-22: 40 meq via ORAL
  Filled 2015-12-22: qty 2

## 2015-12-22 MED ORDER — SODIUM CHLORIDE 0.9 % IV SOLN
INTRAVENOUS | Status: DC
  Administered 2015-12-22 – 2015-12-23 (×2): via INTRAVENOUS
  Administered 2015-12-25 (×2): 100 mL/h via INTRAVENOUS

## 2015-12-22 MED ORDER — ALTEPLASE 2 MG IJ SOLR
2.0000 mg | Freq: Once | INTRAMUSCULAR | Status: AC
  Administered 2015-12-22: 2 mg
  Filled 2015-12-22: qty 2

## 2015-12-22 MED ORDER — MAGNESIUM SULFATE 4 GM/100ML IV SOLN
4.0000 g | Freq: Once | INTRAVENOUS | Status: AC
  Administered 2015-12-22: 4 g via INTRAVENOUS
  Filled 2015-12-22: qty 100

## 2015-12-22 NOTE — Progress Notes (Signed)
[facility] Progress Note  8 Days Post-Op  Subjective: Still with some hypotension but seems to be improving, UOP good  Denies abdominal pain today but did have some yesterday. Tolerating soft diet. Having bowel movements.   Objective: Vital signs in last 24 hours: Temp:  [98.3 F (36.8 C)-99.1 F (37.3 C)] 98.3 F (36.8 C) (12/09 0517) Pulse Rate:  [87-134] 115 (12/09 1000) Resp:  [18] 18 (12/09 0517) BP: (81-118)/(51-83) 87/51 (12/09 1000) SpO2:  [98 %-99 %] 99 % (12/09 0517) Weight:  [76.2 kg (167 lb 15.9 oz)] 76.2 kg (167 lb 15.9 oz) (12/09 0520) Last BM Date: 12/22/15  Intake/Output from previous day: 12/08 0701 - 12/09 0700 In: 1960 [P.O.:960; IV Piggyback:1000] Out: 1575 [Urine:1575] Intake/Output this shift: Total I/O In: 120 [P.O.:120] Out: 250 [Urine:250]  PE: Gen:  Alert, NAD, pleasant Card:  irregular Pulm:  CTAB, unlabored, few scattered rhonchi Abd: Soft, NT/ND, + BS, midline abdominal incision C/D/I with staples intact   Lab Results:   Recent Labs  12/21/15 0446 12/22/15 0328  WBC 6.9 8.0  HGB 8.5* 8.9*  HCT 24.2* 25.7*  PLT 233 310   BMET  Recent Labs  12/21/15 0446 12/22/15 0328  NA 138 138  K 3.8 3.7  CL 111 110  CO2 23 21*  GLUCOSE 97 115*  BUN 21* 16  CREATININE 1.02* 0.90  CALCIUM 8.2* 8.7*   PT/INR No results for input(s): LABPROT, INR in the last 72 hours. CMP     Component Value Date/Time   NA 138 12/22/2015 0328   NA 142 10/30/2015   K 3.7 12/22/2015 0328   CL 110 12/22/2015 0328   CO2 21 (L) 12/22/2015 0328   GLUCOSE 115 (H) 12/22/2015 0328   BUN 16 12/22/2015 0328   BUN 9 10/30/2015   CREATININE 0.90 12/22/2015 0328   CALCIUM 8.7 (L) 12/22/2015 0328   PROT 5.1 (L) 12/21/2015 0446   ALBUMIN 2.1 (L) 12/21/2015 0446   AST 13 (L) 12/21/2015 0446   ALT 10 (L) 12/21/2015 0446   ALKPHOS 48 12/21/2015 0446   BILITOT 0.6 12/21/2015 0446   GFRNONAA 56 (L) 12/22/2015 0328   GFRAA >60 12/22/2015 0328    Lipase     Component Value Date/Time   LIPASE 37 12/03/2015 0645       Studies/Results: Dg Chest Port 1 View  Result Date: 12/20/2015 CLINICAL DATA:  Cough, congestion EXAM: PORTABLE CHEST 1 VIEW COMPARISON:  12/02/2015 FINDINGS: Borderline cardiomegaly. Mild elevation of the right hemidiaphragm. No infiltrate or pulmonary edema. Mild infrahilar bronchitic changes. Large hiatal hernia is noted. Stable left basilar scarring. Right PICC line is unchanged in position. IMPRESSION: No infiltrate or pulmonary edema. Infrahilar bronchitic changes. Large hiatal hernia. Electronically Signed   By: Natasha MeadLiviu  Pop M.D.   On: 12/20/2015 12:09    Anti-infectives: Anti-infectives    Start     Dose/Rate Route Frequency Ordered Stop   12/14/15 0600  clindamycin (CLEOCIN) IVPB 900 mg  Status:  Discontinued     900 mg 100 mL/hr over 30 Minutes Intravenous On call to O.R. 12/13/15 1305 12/14/15 1618   12/14/15 0600  gentamicin (GARAMYCIN) 310 mg in dextrose 5 % 100 mL IVPB  Status:  Discontinued     5 mg/kg  61.4 kg 107.8 mL/hr over 60 Minutes Intravenous On call to O.R. 12/13/15 1305 12/14/15 1618   12/03/15 0830  fluconazole (DIFLUCAN) tablet 150 mg     150 mg Oral  Once 12/03/15 0828 12/03/15 0859  12/02/15 2200  levofloxacin (LEVAQUIN) IVPB 500 mg  Status:  Discontinued     500 mg 100 mL/hr over 60 Minutes Intravenous Every 48 hours 11/30/15 2202 12/03/15 1036   11/30/15 2230  aztreonam (AZACTAM) 500 mg in dextrose 5 % 50 mL IVPB  Status:  Discontinued     500 mg 100 mL/hr over 30 Minutes Intravenous Every 8 hours 11/30/15 2203 12/01/15 0732   11/30/15 2145  levofloxacin (LEVAQUIN) IVPB 750 mg     750 mg 100 mL/hr over 90 Minutes Intravenous  Once 11/30/15 2130 11/30/15 2327   11/30/15 2145  aztreonam (AZACTAM) 2 g in dextrose 5 % 50 mL IVPB  Status:  Discontinued     2 g 100 mL/hr over 30 Minutes Intravenous  Once 11/30/15 2130 11/30/15 2203     Assessment/Plan Recurrent SBO (small bowel  obstruction) S/p EXPLORATORY LAPAROTOMY,  LYSIS OF ADHESIONS, DEBRIDMENT OF SACRAL DECUBITUS ULCER, skin subcutaneoustissue and fascia 6 x 6 cm 12/1 Dr. Johna Sheriff - POD 7 - WBC WNL, afebrile - passing flatus and had multiple BM's   Hypotension - 80/46; mild tachycardia and increase in SCr (1.02) and leukocytosis; suspect secondary to dehydration 1 liter IV bolus, held AM dose keppra; appreciate internal medicine assistance/recommendations; amiodarone will be administered if systolic > 93.   Atrial fibrillation  Severe protein calorie malnutrition Dementia  VTE - Eliquis FEN - full liquid diet  Plan -  Cont soft diet Tachycardia improving < 100bpm Monitor pressures- appreciate medicine assistance/recommendations. Continue current wound care to sacral wound Encourage mobilization with PT once blood pressure/HR stable   LOS: 22 days    Vanita Panda, MD  Colorectal and General Surgery [facility]

## 2015-12-22 NOTE — Progress Notes (Signed)
PROGRESS NOTE    Brandi Heath  GZF:[id number] DOB: January 12, 1928 DOA: 11/30/2015 PCP: Merrilee Seashore, MD    Brief Narrative:  82 year old female with history of A. 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  Patient having recurrent small bowel obstructions (4 episodes over the last few weeks). Surgery consulted and patient taken to or on 12/1For laparotomy and lysis of adhesions.  stepdown unit postop.  Assessment & Plan:   Principal Problem:   SBO (small bowel obstruction) Active Problems:   OSA (obstructive sleep apnea)   Dementia with behavioral disturbance   Hypertensive heart disease with CHF (congestive heart failure) (HCC)   Atrial fibrillation with rapid ventricular response (HCC)   Acute encephalopathy   UTI (urinary tract infection)   History of CVA (cerebrovascular accident)   GERD (gastroesophageal reflux disease)   Anemia, chronic disease   Non-ischemic cardiomyopathy (HCC)   MR (mitral regurgitation)   Acute kidney injury (HCC)   Acute renal failure (ARF) (HCC)   Dehydration   Pressure injury of skin   Protein-calorie malnutrition, severe   Palliative care encounter   Goals of care, counseling/discussion   Chronic anticoagulation   Encounter for hospice care discussion   Hypokalemia   Hypomagnesemia   S/P exploratory laparotomy   Hypotension   Atrial fibrillation with RVR (HCC)    Principal Problem:   Recurrent SBO (small bowel obstruction) - patient with recurrent SBO, s/p laparotomy w/ lysis of adhesions on 12/1 - Pt reportedly tolerating PO. Advancing diet, however per staff, pt is taking small amounts of PO - General surgery following  Active Problems:   Atrial fibrillation with RVR (HCC) - Remains off cardizem gtt secondary to low BP - Pt had been seen by Cardiology. Recent recommendations noted for continued amiodarone - On  eliquis for secondary stroke prevention - Discussed with pharmacy. Per pharmacy, PO amiodarone has roughly 1% risk of associated hypotension. Recommend continued amiodarone as tolerated - Have added IV labetalol PRN with hold parameters -Continue amiodarone as tolerated  Hypotension - Patient reported to have over 10 large bowel movements recently. Suspect dehydration, thus agree with cautious hydration as tolerated - recommend gentle IVF boluses as needed for hypotension  Sacral decubitus ulcer - s/p debridement - Will continue per surgery  Seizure disorder - Continue dilantin as scheduled - Remains seizure free  Acute kidney injury - Suspect secondary to dehydration - Cr improved this AM with IVF - Will increase IVF to 100cc/hr  Hypokalemia/hypomagnesemia - Electrolytes reviewed and are unremarkable - will repeat bmet in AM  Severe protein calorie malnutrition - Nutrition consulted  Anemia of chronic disease - hgb of 8.5. Plan to transfuse if <7  Moderate  dementia - remains stable at this time  SOB - Remains stable at present - Recent CXR reviewed and is unremarkable  Chronic systolic CHF - Most recent EF noted to be around 40-45% - No evidence of gross volume overload on exam today - Continue with cautious IVF hydration as tolerated per above  Hypomagnesemia -Will replace -Repeat mg level in AM  DVT prophylaxis: Eliquis Code Status: DNR Family Communication: Pt in room, family not at bedside Disposition Plan: Per primary service   Procedures:  Laparotomy with lysis of adhesions on 12/1 Sacral ulcer debridement on 12/1.  Antimicrobials: Anti-infectives    Start     Dose/Rate Route Frequency Ordered Stop   12/14/15 0600  clindamycin (CLEOCIN) IVPB 900  mg  Status:  Discontinued     900 mg 100 mL/hr over 30 Minutes Intravenous On call to O.R. 12/13/15 1305 12/14/15 1618   12/14/15 0600  gentamicin (GARAMYCIN) 310 mg in dextrose 5 % 100 mL IVPB   Status:  Discontinued     5 mg/kg  61.4 kg 107.8 mL/hr over 60 Minutes Intravenous On call to O.R. 12/13/15 1305 12/14/15 1618   12/03/15 0830  fluconazole (DIFLUCAN) tablet 150 mg     150 mg Oral  Once 12/03/15 0828 12/03/15 0859   12/02/15 2200  levofloxacin (LEVAQUIN) IVPB 500 mg  Status:  Discontinued     500 mg 100 mL/hr over 60 Minutes Intravenous Every 48 hours 11/30/15 2202 12/03/15 1036   11/30/15 2230  aztreonam (AZACTAM) 500 mg in dextrose 5 % 50 mL IVPB  Status:  Discontinued     500 mg 100 mL/hr over 30 Minutes Intravenous Every 8 hours 11/30/15 2203 12/01/15 0732   11/30/15 2145  levofloxacin (LEVAQUIN) IVPB 750 mg     750 mg 100 mL/hr over 90 Minutes Intravenous  Once 11/30/15 2130 11/30/15 2327   11/30/15 2145  aztreonam (AZACTAM) 2 g in dextrose 5 % 50 mL IVPB  Status:  Discontinued     2 g 100 mL/hr over 30 Minutes Intravenous  Once 11/30/15 2130 11/30/15 2203      Subjective: Without complaints   Objective: Vitals:   12/22/15 1000 12/22/15 1137 12/22/15 1431 12/22/15 1815  BP: (!) 87/51 (!) 101/52 93/65 108/68  Pulse: (!) 115 (!) 115 (!) 120 (!) 112  Resp:   16   Temp:   98.3 F (36.8 C)   TempSrc:   Oral   SpO2:   99%   Weight:      Height:        Intake/Output Summary (Last 24 hours) at 12/22/15 1824 Last data filed at 12/22/15 1438  Gross per 24 hour  Intake          1623.75 ml  Output             1375 ml  Net           248.75 ml   Filed Weights   12/21/15 0700 12/21/15 0933 12/22/15 0520  Weight: 76.1 kg (167 lb 12.3 oz) 76.3 kg (168 lb 3.4 oz) 76.2 kg (167 lb 15.9 oz)    Examination:  General exam: Laying in bed, in nad Respiratory system: normal resp effort, no wheezing Cardiovascular system: irregularly irregular, s1, s2 Gastrointestinal system: soft, nondistended Central nervous system: cn2-12 grossly intact, strength/sensation intact Extremities: perfused, no clubbing Skin: normal skin turgor, no notable skin lesions  seen Psychiatry: mood normal// no visual hallucinations   Data Reviewed: I have personally reviewed following labs and imaging studies  CBC:  Recent Labs Lab 12/17/15 0501  12/18/15 0535 12/19/15 0401 12/20/15 0410 12/21/15 0446 12/22/15 0328  WBC 7.2  < > 6.1 6.1 6.0 6.9 8.0  NEUTROABS 5.6  --   --   --   --   --   --   HGB 9.7*  < > 8.9* 9.3* 8.9* 8.5* 8.9*  HCT 27.1*  < > 25.2* 26.0* 25.5* 24.2* 25.7*  MCV 81.6  < > 80.8 80.2 82.0 82.6 82.6  PLT 150  < > 157 193 217 233 310  < > = values in this interval not displayed. Basic Metabolic Panel:  Recent Labs Lab 12/16/15 0336 12/17/15 0501 12/18/15 0535 12/19/15 0401 12/20/15 0410 12/21/15 1610  12/22/15 0328  NA 132* 133* 135 136 135 138 138  K 3.9 3.3* 3.8 4.9 3.6 3.8 3.7  CL 105 104 108 108 108 111 110  CO2 20* 21* 22 22 22 23  21*  GLUCOSE 140* 125* 131* 100* 89 97 115*  BUN 22* 22* 26* 25* 22* 21* 16  CREATININE 1.21* 1.14* 0.92 0.91 0.89 1.02* 0.90  CALCIUM 8.3* 8.4* 8.5* 7.1* 8.4* 8.2* 8.7*  MG 1.9 1.8 1.8 1.4* 1.7  --  1.5*  PHOS 3.2 3.4 3.4 3.1  --   --   --    GFR: Estimated Creatinine Clearance: 46.9 mL/min (by C-G formula based on SCr of 0.9 mg/dL). Liver Function Tests:  Recent Labs Lab 12/16/15 0336 12/17/15 0501 12/18/15 0535 12/21/15 0446  AST 13* 12* 15 13*  ALT 11* 11* 12* 10*  ALKPHOS 54 54 46 48  BILITOT 0.6 0.7 0.4 0.6  PROT 5.5* 5.5* 5.2* 5.1*  ALBUMIN 2.4* 2.4* 2.3* 2.1*   No results for input(s): LIPASE, AMYLASE in the last 168 hours. No results for input(s): AMMONIA in the last 168 hours. Coagulation Profile: No results for input(s): INR, PROTIME in the last 168 hours. Cardiac Enzymes: No results for input(s): CKTOTAL, CKMB, CKMBINDEX, TROPONINI in the last 168 hours. BNP (last 3 results) No results for input(s): PROBNP in the last 8760 hours. HbA1C: No results for input(s): HGBA1C in the last 72 hours. CBG:  Recent Labs Lab 12/19/15 2336 12/21/15 1158 12/21/15 1741  12/21/15 2355 12/22/15 0546  GLUCAP 81 106* 95 101* 101*   Lipid Profile: No results for input(s): CHOL, HDL, LDLCALC, TRIG, CHOLHDL, LDLDIRECT in the last 72 hours. Thyroid Function Tests: No results for input(s): TSH, T4TOTAL, FREET4, T3FREE, THYROIDAB in the last 72 hours. Anemia Panel: No results for input(s): VITAMINB12, FOLATE, FERRITIN, TIBC, IRON, RETICCTPCT in the last 72 hours. Sepsis Labs: No results for input(s): PROCALCITON, LATICACIDVEN in the last 168 hours.  No results found for this or any previous visit (from the past 240 hour(s)).   Radiology Studies: No results found.  Scheduled Meds: . amiodarone  200 mg Oral Daily  . apixaban  2.5 mg Oral BID  . chlorhexidine  15 mL Mouth Rinse BID  . donepezil  10 mg Oral QHS  . DULoxetine  60 mg Oral QHS  . feeding supplement  1 Container Oral TID WC  . feeding supplement (PRO-STAT SUGAR FREE 64)  30 mL Oral TID BM  . latanoprost  1 drop Both Eyes QHS  . levETIRAcetam  750 mg Intravenous Q12H  . lip balm  1 application Topical BID  . mouth rinse  15 mL Mouth Rinse q12n4p  . multivitamin  15 mL Oral Daily  . MUSCLE RUB   Topical QHS  . pantoprazole  40 mg Oral Daily  . sodium chloride flush  10-40 mL Intracatheter Q12H  . sodium chloride flush  3 mL Intravenous Q12H  . vitamin C  500 mg Oral Daily   Continuous Infusions: . sodium chloride 100 mL/hr at 12/22/15 1817     LOS: 22 days   CHIU, Scheryl MartenSTEPHEN K, MD [facility] Pager [phone]  If 7PM-7AM, please contact night-coverage [email or web address] Password [email or web address] 12/22/2015, 6:24 PM

## 2015-12-22 NOTE — Progress Notes (Signed)
PT Cancellation Note  Patient Details Name: Charlin Deantonio MRN: [id number] DOB: 1927-06-15   Cancelled Treatment:    Reason Eval/Treat Not Completed: Medical issues which prohibited therapy , low BP per RN. Was assisted OOB via lift by nursing12/8.   Rada Hay 12/22/2015, 10:26 AM Blanchard Kelch PT [phone]

## 2015-12-23 LAB — CBC
HEMATOCRIT: 25.1 % — AB (ref 36.0–46.0)
HEMOGLOBIN: 8.8 g/dL — AB (ref 12.0–15.0)
MCH: 28.9 pg (ref 26.0–34.0)
MCHC: 35.1 g/dL (ref 30.0–36.0)
MCV: 82.3 fL (ref 78.0–100.0)
Platelets: 341 10*3/uL (ref 150–400)
RBC: 3.05 MIL/uL — ABNORMAL LOW (ref 3.87–5.11)
RDW: 16 % — ABNORMAL HIGH (ref 11.5–15.5)
WBC: 7.1 10*3/uL (ref 4.0–10.5)

## 2015-12-23 LAB — MAGNESIUM: Magnesium: 1.9 mg/dL (ref 1.7–2.4)

## 2015-12-23 MED ORDER — ENSURE ENLIVE PO LIQD
237.0000 mL | Freq: Two times a day (BID) | ORAL | Status: DC
  Administered 2015-12-23 – 2015-12-25 (×2): 237 mL via ORAL

## 2015-12-23 MED ORDER — LEVETIRACETAM 750 MG PO TABS
750.0000 mg | ORAL_TABLET | Freq: Two times a day (BID) | ORAL | Status: DC
Start: 2015-12-23 — End: 2015-12-25
  Administered 2015-12-23 – 2015-12-25 (×5): 750 mg via ORAL
  Filled 2015-12-23 (×5): qty 1

## 2015-12-23 NOTE — Evaluation (Signed)
Physical Therapy Evaluation Patient Details Name: Brandi Heath MRN: [id number] DOB: [birth year] Today's Date: 12/23/2015   History of Present Illness  82 year old female with history of A. 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 s/p LOA 12/14/15.   Clinical Impression  Pt admitted with above diagnosis. Pt currently with functional limitations due to the deficits listed below (see PT Problem List). +2 mod assist to pivot to recliner.  Pt will benefit from skilled PT to increase their independence and safety with mobility to allow discharge to the venue listed below.       Follow Up Recommendations SNF    Equipment Recommendations  None recommended by PT    Recommendations for Other Services       Precautions / Restrictions Precautions Precautions: Fall Precaution Comments: incontinent Restrictions Weight Bearing Restrictions: No      Mobility  Bed Mobility Overal bed mobility: Needs Assistance Bed Mobility: Supine to Sit     Supine to sit: Mod assist     General bed mobility comments: cues for direction and for scoot plus assist to come to sitting and more assist to scoot  Transfers Overall transfer level: Needs assistance Equipment used: Rolling walker (2 wheeled) Transfers: Sit to/from [employer] Sit to Stand: Mod assist;+2 physical assistance;+2 safety/equipment (need for 2 person assist, but not available) Stand pivot transfers: Mod assist;+2 safety/equipment       General transfer comment: mod A to rise, pt unable to weight shift to pivot so returned to sitting and did side by side SPT with +2 mod A  Ambulation/Gait             General Gait Details: not applicable today  Stairs            Wheelchair Mobility    Modified Rankin (Stroke Patients Only)       Balance     Sitting balance-Leahy Scale:  Fair       Standing balance-Leahy Scale: Poor                               Pertinent Vitals/Pain Pain Assessment: No/denies pain    Home Living Family/patient expects to be discharged to:: Skilled nursing facility                      Prior Function Level of Independence: Needs assistance   Gait / Transfers Assistance Needed: assisted by staff, used RW to walk "a little" and WC  ADL's / Homemaking Assistance Needed: assisted  Comments: reports she is able to transfer with RW to w/c     Hand Dominance        Extremity/Trunk Assessment               Lower Extremity Assessment: RLE deficits/detail;LLE deficits/detail RLE Deficits / Details: ankle DF 0/5 (pt reports this is 2* CVA), knee ext -4/5 LLE Deficits / Details: knee ext 4/5  Cervical / Trunk Assessment: Normal  Communication   Communication: No difficulties  Cognition Arousal/Alertness: Awake/alert Behavior During Therapy: WFL for tasks assessed/performed Overall Cognitive Status: History of cognitive impairments - at baseline                      General Comments      Exercises     Assessment/Plan    PT Assessment Patient  needs continued PT services  PT Problem List Decreased activity tolerance;Decreased strength;Decreased balance;Decreased cognition;Decreased mobility;Decreased knowledge of use of DME          PT Treatment Interventions DME instruction;Gait training;Functional mobility training;Therapeutic activities;Balance training;Patient/family education    PT Goals (Current goals can be found in the Care Plan section)  Acute Rehab PT Goals Patient Stated Goal: to get stronger PT Goal Formulation: With patient Time For Goal Achievement: 01/06/16 Potential to Achieve Goals: Good    Frequency Min 2X/week   Barriers to discharge        Co-evaluation               End of Session Equipment Utilized During Treatment: Gait belt Activity Tolerance:  Patient tolerated treatment well Patient left: in chair;with call bell/phone within reach;with chair alarm set Nurse Communication: Mobility status;Need for lift equipment (lift pad in chair)         Time: 1010-1029 PT Time Calculation (min) (ACUTE ONLY): 19 min   Charges:   PT Evaluation $PT Eval Moderate Complexity: 1 Procedure     PT G Codes:        Tamala SerUhlenberg, Anjel Perfetti Kistler 12/23/2015, 10:57 AM [phone]

## 2015-12-23 NOTE — Progress Notes (Signed)
[facility] Progress Note  9 Days Post-Op  Subjective: BP improving, HR elevated, UOP good  Denies abdominal pain today. Tolerating soft diet per pt. Having bowel movements.  RN staff states pt not eating much  Objective: Vital signs in last 24 hours: Temp:  [98.3 F (36.8 C)-98.8 F (37.1 C)] 98.3 F (36.8 C) (12/10 0524) Pulse Rate:  [110-120] 110 (12/10 0524) Resp:  [16] 16 (12/10 0524) BP: (93-116)/(52-76) 110/76 (12/10 0524) SpO2:  [99 %] 99 % (12/10 0524) Weight:  [65 kg (143 lb 4.8 oz)] 65 kg (143 lb 4.8 oz) (12/10 0848) Last BM Date: 12/22/15  Intake/Output from previous day: 12/09 0701 - 12/10 0700 In: 2454.2 [P.O.:570; I.V.:1061.7; IV Piggyback:822.5] Out: 1025 [Urine:1025] Intake/Output this shift: No intake/output data recorded.  PE: Gen:  Alert, NAD, pleasant Card:  irregular Pulm:   unlabored Abd: Soft, NT/ND,  midline abdominal incision C/D/I with staples intact   Lab Results:   Recent Labs  12/22/15 0328 12/23/15 0321  WBC 8.0 7.1  HGB 8.9* 8.8*  HCT 25.7* 25.1*  PLT 310 341   BMET  Recent Labs  12/21/15 0446 12/22/15 0328  NA 138 138  K 3.8 3.7  CL 111 110  CO2 23 21*  GLUCOSE 97 115*  BUN 21* 16  CREATININE 1.02* 0.90  CALCIUM 8.2* 8.7*   PT/INR No results for input(s): LABPROT, INR in the last 72 hours. CMP     Component Value Date/Time   NA 138 12/22/2015 0328   NA 142 10/30/2015   K 3.7 12/22/2015 0328   CL 110 12/22/2015 0328   CO2 21 (L) 12/22/2015 0328   GLUCOSE 115 (H) 12/22/2015 0328   BUN 16 12/22/2015 0328   BUN 9 10/30/2015   CREATININE 0.90 12/22/2015 0328   CALCIUM 8.7 (L) 12/22/2015 0328   PROT 5.1 (L) 12/21/2015 0446   ALBUMIN 2.1 (L) 12/21/2015 0446   AST 13 (L) 12/21/2015 0446   ALT 10 (L) 12/21/2015 0446   ALKPHOS 48 12/21/2015 0446   BILITOT 0.6 12/21/2015 0446   GFRNONAA 56 (L) 12/22/2015 0328   GFRAA >60 12/22/2015 0328   Lipase     Component Value Date/Time   LIPASE 37  12/03/2015 0645       Studies/Results: No results found.  Anti-infectives: Anti-infectives    Start     Dose/Rate Route Frequency Ordered Stop   12/14/15 0600  clindamycin (CLEOCIN) IVPB 900 mg  Status:  Discontinued     900 mg 100 mL/hr over 30 Minutes Intravenous On call to O.R. 12/13/15 1305 12/14/15 1618   12/14/15 0600  gentamicin (GARAMYCIN) 310 mg in dextrose 5 % 100 mL IVPB  Status:  Discontinued     5 mg/kg  61.4 kg 107.8 mL/hr over 60 Minutes Intravenous On call to O.R. 12/13/15 1305 12/14/15 1618   12/03/15 0830  fluconazole (DIFLUCAN) tablet 150 mg     150 mg Oral  Once 12/03/15 0828 12/03/15 0859   12/02/15 2200  levofloxacin (LEVAQUIN) IVPB 500 mg  Status:  Discontinued     500 mg 100 mL/hr over 60 Minutes Intravenous Every 48 hours 11/30/15 2202 12/03/15 1036   11/30/15 2230  aztreonam (AZACTAM) 500 mg in dextrose 5 % 50 mL IVPB  Status:  Discontinued     500 mg 100 mL/hr over 30 Minutes Intravenous Every 8 hours 11/30/15 2203 12/01/15 0732   11/30/15 2145  levofloxacin (LEVAQUIN) IVPB 750 mg     750 mg 100 mL/hr over  90 Minutes Intravenous  Once 11/30/15 2130 11/30/15 2327   11/30/15 2145  aztreonam (AZACTAM) 2 g in dextrose 5 % 50 mL IVPB  Status:  Discontinued     2 g 100 mL/hr over 30 Minutes Intravenous  Once 11/30/15 2130 11/30/15 2203     Assessment/Plan Recurrent SBO (small bowel obstruction) S/p EXPLORATORY LAPAROTOMY,  LYSIS OF ADHESIONS, DEBRIDMENT OF SACRAL DECUBITUS ULCER, skin subcutaneoustissue and fascia 6 x 6 cm 12/1 Dr. Johna Sheriff   Hypotension -  suspect secondary to dehydration, resolved with added fluids  Atrial fibrillation - management per medicine recs  Severe protein calorie malnutrition- will start feeding supplements Dementia  VTE - Eliquis FEN - soft diet  Plan -  Cont soft diet Tachycardia per medicine recs Monitor pressures- appreciate medicine assistance/recommendations. Continue current wound care to sacral  wound Encourage mobilization with PT once blood pressure/HR stable   LOS: 23 days    Vanita Panda, MD  Colorectal and General Surgery [facility]

## 2015-12-23 NOTE — Progress Notes (Signed)
PROGRESS NOTE    Brandi Heath  VFI:[id number] DOB: 1927/04/05 DOA: 11/30/2015 PCP: Merrilee SeashoreAnne Alexander, MD    Brief Narrative:  82 year old female with history of A. 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  Patient having recurrent small bowel obstructions (4 episodes over the last few weeks). Surgery consulted and patient taken to or on 12/1For laparotomy and lysis of adhesions.  stepdown unit postop.  Assessment & Plan:   Principal Problem:   SBO (small bowel obstruction) Active Problems:   OSA (obstructive sleep apnea)   Dementia with behavioral disturbance   Hypertensive heart disease with CHF (congestive heart failure) (HCC)   Atrial fibrillation with rapid ventricular response (HCC)   Acute encephalopathy   UTI (urinary tract infection)   History of CVA (cerebrovascular accident)   GERD (gastroesophageal reflux disease)   Anemia, chronic disease   Non-ischemic cardiomyopathy (HCC)   MR (mitral regurgitation)   Acute kidney injury (HCC)   Acute renal failure (ARF) (HCC)   Dehydration   Pressure injury of skin   Protein-calorie malnutrition, severe   Palliative care encounter   Goals of care, counseling/discussion   Chronic anticoagulation   Encounter for hospice care discussion   Hypokalemia   Hypomagnesemia   S/P exploratory laparotomy   Hypotension   Atrial fibrillation with RVR (HCC)    Principal Problem:   Recurrent SBO (small bowel obstruction) - patient with recurrent SBO, s/p laparotomy w/ lysis of adhesions on 12/1 - Pt reportedly tolerating PO. Advancing diet, however per staff, pt is taking small amounts of PO - General surgery following  Active Problems:   Atrial fibrillation with RVR (HCC) - Remains off cardizem gtt secondary to low BP - Pt had been seen by Cardiology. Recent recommendations noted for continued amiodarone - On  eliquis for secondary stroke prevention - Discussed with pharmacy. Per pharmacy, PO amiodarone has roughly 1% risk of associated hypotension. Recommend continued amiodarone as tolerated - Have added IV labetalol PRN with hold parameters -Continue amiodarone as tolerated  Hypotension - Patient reported to have over 10 large bowel movements recently. Suspect dehydration, thus agree with cautious hydration as tolerated - recommend gentle IVF boluses as needed for hypotension  Sacral decubitus ulcer - s/p debridement - Will continue per surgery  Seizure disorder - Continue keppra as scheduled - Remains seizure free - Will transition IV keppra to PO keppra   Acute kidney injury - Suspect secondary to dehydration - Patient remains to 100cc/hr - 120cc urine documented thus far today - Continue IVF. Repeat bmet in AM  Hypokalemia/hypomagnesemia - Electrolytes reviewed - Magnesium replaced  Severe protein calorie malnutrition - Dietitian was consulted - Stable  Anemia of chronic disease - CBC reviewed - Hgb today of 8.8  Moderate  dementia - Patient remains stable  SOB - Recent CXR unremarkable - On min O2 support currently  Chronic systolic CHF - Most recent EF noted to be around 40-45% - Appears to be euvolemic at present - Continue with cautious IVF per above  Hypomagnesemia - Replaced - Labs reviewed  DVT prophylaxis: Eliquis Code Status: DNR Family Communication: Pt in room, family not at bedside Disposition Plan: Per primary service   Procedures:  Laparotomy with lysis of adhesions on 12/1 Sacral ulcer debridement on 12/1.  Antimicrobials: Anti-infectives    Start     Dose/Rate Route Frequency Ordered Stop   12/14/15 0600  clindamycin (CLEOCIN) IVPB 900 mg  Status:  Discontinued     900 mg 100 mL/hr over 30 Minutes Intravenous On call to O.R. 12/13/15 1305 12/14/15 1618   12/14/15 0600  gentamicin (GARAMYCIN) 310 mg in dextrose 5 % 100 mL IVPB   Status:  Discontinued     5 mg/kg  61.4 kg 107.8 mL/hr over 60 Minutes Intravenous On call to O.R. 12/13/15 1305 12/14/15 1618   12/03/15 0830  fluconazole (DIFLUCAN) tablet 150 mg     150 mg Oral  Once 12/03/15 0828 12/03/15 0859   12/02/15 2200  levofloxacin (LEVAQUIN) IVPB 500 mg  Status:  Discontinued     500 mg 100 mL/hr over 60 Minutes Intravenous Every 48 hours 11/30/15 2202 12/03/15 1036   11/30/15 2230  aztreonam (AZACTAM) 500 mg in dextrose 5 % 50 mL IVPB  Status:  Discontinued     500 mg 100 mL/hr over 30 Minutes Intravenous Every 8 hours 11/30/15 2203 12/01/15 0732   11/30/15 2145  levofloxacin (LEVAQUIN) IVPB 750 mg     750 mg 100 mL/hr over 90 Minutes Intravenous  Once 11/30/15 2130 11/30/15 2327   11/30/15 2145  aztreonam (AZACTAM) 2 g in dextrose 5 % 50 mL IVPB  Status:  Discontinued     2 g 100 mL/hr over 30 Minutes Intravenous  Once 11/30/15 2130 11/30/15 2203      Subjective: No complaints today. Patient denies abd pain  Objective: Vitals:   12/22/15 1815 12/22/15 2141 12/23/15 0524 12/23/15 0848  BP: 108/68 116/72 110/76   Pulse: (!) 112 (!) 118 (!) 110   Resp:  16 16   Temp:  98.8 F (37.1 C) 98.3 F (36.8 C)   TempSrc:  Oral Oral   SpO2:  99% 99%   Weight:    65 kg (143 lb 4.8 oz)  Height:        Intake/Output Summary (Last 24 hours) at 12/23/15 1605 Last data filed at 12/23/15 1440  Gross per 24 hour  Intake          1310.42 ml  Output              950 ml  Net           360.42 ml   Filed Weights   12/21/15 0933 12/22/15 0520 12/23/15 0848  Weight: 76.3 kg (168 lb 3.4 oz) 76.2 kg (167 lb 15.9 oz) 65 kg (143 lb 4.8 oz)    Examination:  General exam: Awake, in nad, conversant Respiratory system: normal chest rise, clear to auscultation  Cardiovascular system: mildly tachycardic, s1, s2 Gastrointestinal system: nondistended, pos BS in all quadrants Central nervous system: no tremors, no seizures Extremities: perfused, no clubbing Skin:  normal skin turgor, no notable skin lesions seen Psychiatry: mood normal// no visual hallucinations   Data Reviewed: I have personally reviewed following labs and imaging studies  CBC:  Recent Labs Lab 12/17/15 0501  12/19/15 0401 12/20/15 0410 12/21/15 0446 12/22/15 0328 12/23/15 0321  WBC 7.2  < > 6.1 6.0 6.9 8.0 7.1  NEUTROABS 5.6  --   --   --   --   --   --   HGB 9.7*  < > 9.3* 8.9* 8.5* 8.9* 8.8*  HCT 27.1*  < > 26.0* 25.5* 24.2* 25.7* 25.1*  MCV 81.6  < > 80.2 82.0 82.6 82.6 82.3  PLT 150  < > 193 217 233 310 341  < > = values in this interval not displayed. Basic Metabolic Panel:  Recent Labs Lab  12/17/15 0501 12/18/15 0535 12/19/15 0401 12/20/15 0410 12/21/15 0446 12/22/15 0328 12/23/15 0321  NA 133* 135 136 135 138 138  --   K 3.3* 3.8 4.9 3.6 3.8 3.7  --   CL 104 108 108 108 111 110  --   CO2 21* 22 22 22 23  21*  --   GLUCOSE 125* 131* 100* 89 97 115*  --   BUN 22* 26* 25* 22* 21* 16  --   CREATININE 1.14* 0.92 0.91 0.89 1.02* 0.90  --   CALCIUM 8.4* 8.5* 7.1* 8.4* 8.2* 8.7*  --   MG 1.8 1.8 1.4* 1.7  --  1.5* 1.9  PHOS 3.4 3.4 3.1  --   --   --   --    GFR: Estimated Creatinine Clearance: 46 mL/min (by C-G formula based on SCr of 0.9 mg/dL). Liver Function Tests:  Recent Labs Lab 12/17/15 0501 12/18/15 0535 12/21/15 0446  AST 12* 15 13*  ALT 11* 12* 10*  ALKPHOS 54 46 48  BILITOT 0.7 0.4 0.6  PROT 5.5* 5.2* 5.1*  ALBUMIN 2.4* 2.3* 2.1*   No results for input(s): LIPASE, AMYLASE in the last 168 hours. No results for input(s): AMMONIA in the last 168 hours. Coagulation Profile: No results for input(s): INR, PROTIME in the last 168 hours. Cardiac Enzymes: No results for input(s): CKTOTAL, CKMB, CKMBINDEX, TROPONINI in the last 168 hours. BNP (last 3 results) No results for input(s): PROBNP in the last 8760 hours. HbA1C: No results for input(s): HGBA1C in the last 72 hours. CBG:  Recent Labs Lab 12/19/15 2336 12/21/15 1158  12/21/15 1741 12/21/15 2355 12/22/15 0546  GLUCAP 81 106* 95 101* 101*   Lipid Profile: No results for input(s): CHOL, HDL, LDLCALC, TRIG, CHOLHDL, LDLDIRECT in the last 72 hours. Thyroid Function Tests: No results for input(s): TSH, T4TOTAL, FREET4, T3FREE, THYROIDAB in the last 72 hours. Anemia Panel: No results for input(s): VITAMINB12, FOLATE, FERRITIN, TIBC, IRON, RETICCTPCT in the last 72 hours. Sepsis Labs: No results for input(s): PROCALCITON, LATICACIDVEN in the last 168 hours.  No results found for this or any previous visit (from the past 240 hour(s)).   Radiology Studies: No results found.  Scheduled Meds: . amiodarone  200 mg Oral Daily  . apixaban  2.5 mg Oral BID  . chlorhexidine  15 mL Mouth Rinse BID  . donepezil  10 mg Oral QHS  . DULoxetine  60 mg Oral QHS  . feeding supplement  1 Container Oral TID WC  . feeding supplement (ENSURE ENLIVE)  237 mL Oral BID BM  . feeding supplement (PRO-STAT SUGAR FREE 64)  30 mL Oral TID BM  . latanoprost  1 drop Both Eyes QHS  . levETIRAcetam  750 mg Oral BID  . lip balm  1 application Topical BID  . mouth rinse  15 mL Mouth Rinse q12n4p  . multivitamin  15 mL Oral Daily  . MUSCLE RUB   Topical QHS  . pantoprazole  40 mg Oral Daily  . sodium chloride flush  10-40 mL Intracatheter Q12H  . sodium chloride flush  3 mL Intravenous Q12H  . vitamin C  500 mg Oral Daily   Continuous Infusions: . sodium chloride 100 mL/hr at 12/23/15 0904     LOS: 23 days   CHIU, Scheryl Marten, MD [facility] Pager [phone]  If 7PM-7AM, please contact night-coverage [email or web address] Password [email or web address] 12/23/2015, 4:05 PM

## 2015-12-24 LAB — BASIC METABOLIC PANEL
Anion gap: 6 (ref 5–15)
BUN: 11 mg/dL (ref 6–20)
CHLORIDE: 112 mmol/L — AB (ref 101–111)
CO2: 21 mmol/L — AB (ref 22–32)
Calcium: 8.2 mg/dL — ABNORMAL LOW (ref 8.9–10.3)
Creatinine, Ser: 0.87 mg/dL (ref 0.44–1.00)
GFR calc non Af Amer: 59 mL/min — ABNORMAL LOW (ref >60)
Glucose, Bld: 135 mg/dL — ABNORMAL HIGH (ref 65–99)
POTASSIUM: 3.1 mmol/L — AB (ref 3.5–5.1)
SODIUM: 139 mmol/L (ref 135–145)

## 2015-12-24 MED ORDER — AMIODARONE HCL 100 MG PO TABS
100.0000 mg | ORAL_TABLET | Freq: Every day | ORAL | Status: DC
  Administered 2015-12-24: 100 mg via ORAL
  Filled 2015-12-24: qty 1

## 2015-12-24 MED ORDER — AMIODARONE HCL 200 MG PO TABS
200.0000 mg | ORAL_TABLET | Freq: Every morning | ORAL | Status: DC
  Administered 2015-12-25: 200 mg via ORAL
  Filled 2015-12-24: qty 1

## 2015-12-24 MED ORDER — POTASSIUM CHLORIDE CRYS ER 20 MEQ PO TBCR
40.0000 meq | EXTENDED_RELEASE_TABLET | Freq: Two times a day (BID) | ORAL | Status: AC
  Administered 2015-12-24 (×2): 40 meq via ORAL
  Filled 2015-12-24 (×3): qty 2

## 2015-12-24 MED ORDER — METOPROLOL SUCCINATE ER 25 MG PO TB24
25.0000 mg | ORAL_TABLET | Freq: Every day | ORAL | Status: DC
  Administered 2015-12-24 – 2015-12-25 (×2): 25 mg via ORAL
  Filled 2015-12-24 (×2): qty 1

## 2015-12-24 NOTE — Progress Notes (Signed)
Patient ID: Brandi Heath, female   DOB: 1927-11-05, 82 y.o.   MRN: [id number]  [facility]  10 Days Post-Op  Subjective: No complaints today. Tolerating diet, although she is not taking in very much (about 20% of meals yesterday). Having regular BM's.   Objective: Vital signs in last 24 hours: Temp:  [98.1 F (36.7 C)-99.2 F (37.3 C)] 99.2 F (37.3 C) (12/11 0647) Pulse Rate:  [117-122] 120 (12/11 0647) Resp:  [14-15] 15 (12/11 0647) BP: (103-114)/(64-76) 114/76 (12/11 0647) SpO2:  [97 %-98 %] 97 % (12/11 0647) Weight:  [143 lb 11.8 oz (65.2 kg)] 143 lb 11.8 oz (65.2 kg) (12/11 0647) Last BM Date: 12/23/15  Intake/Output from previous day: 12/10 0701 - 12/11 0700 In: 3345 [P.O.:425; I.V.:2920] Out: 300 [Urine:300] Intake/Output this shift: Total I/O In: 60 [P.O.:60] Out: -   PE: Gen:  Alert, NAD, pleasant Card:  Irregular, tachycardic Pulm:  CTAB, unlabored Abd: Soft, NT/ND,  midline abdominal incision C/D/I with staples intact  Lab Results:   Recent Labs  12/22/15 0328 12/23/15 0321  WBC 8.0 7.1  HGB 8.9* 8.8*  HCT 25.7* 25.1*  PLT 310 341   BMET  Recent Labs  12/22/15 0328  NA 138  K 3.7  CL 110  CO2 21*  GLUCOSE 115*  BUN 16  CREATININE 0.90  CALCIUM 8.7*   PT/INR No results for input(s): LABPROT, INR in the last 72 hours. CMP     Component Value Date/Time   NA 138 12/22/2015 0328   NA 142 10/30/2015   K 3.7 12/22/2015 0328   CL 110 12/22/2015 0328   CO2 21 (L) 12/22/2015 0328   GLUCOSE 115 (H) 12/22/2015 0328   BUN 16 12/22/2015 0328   BUN 9 10/30/2015   CREATININE 0.90 12/22/2015 0328   CALCIUM 8.7 (L) 12/22/2015 0328   PROT 5.1 (L) 12/21/2015 0446   ALBUMIN 2.1 (L) 12/21/2015 0446   AST 13 (L) 12/21/2015 0446   ALT 10 (L) 12/21/2015 0446   ALKPHOS 48 12/21/2015 0446   BILITOT 0.6 12/21/2015 0446   GFRNONAA 56 (L) 12/22/2015 0328   GFRAA >60 12/22/2015 0328   Lipase     Component Value Date/Time   LIPASE 37 12/03/2015 0645       Studies/Results: No results found.  Anti-infectives: Anti-infectives    Start     Dose/Rate Route Frequency Ordered Stop   12/14/15 0600  clindamycin (CLEOCIN) IVPB 900 mg  Status:  Discontinued     900 mg 100 mL/hr over 30 Minutes Intravenous On call to O.R. 12/13/15 1305 12/14/15 1618   12/14/15 0600  gentamicin (GARAMYCIN) 310 mg in dextrose 5 % 100 mL IVPB  Status:  Discontinued     5 mg/kg  61.4 kg 107.8 mL/hr over 60 Minutes Intravenous On call to O.R. 12/13/15 1305 12/14/15 1618   12/03/15 0830  fluconazole (DIFLUCAN) tablet 150 mg     150 mg Oral  Once 12/03/15 0828 12/03/15 0859   12/02/15 2200  levofloxacin (LEVAQUIN) IVPB 500 mg  Status:  Discontinued     500 mg 100 mL/hr over 60 Minutes Intravenous Every 48 hours 11/30/15 2202 12/03/15 1036   11/30/15 2230  aztreonam (AZACTAM) 500 mg in dextrose 5 % 50 mL IVPB  Status:  Discontinued     500 mg 100 mL/hr over 30 Minutes Intravenous Every 8 hours 11/30/15 2203 12/01/15 0732   11/30/15 2145  levofloxacin (LEVAQUIN) IVPB 750 mg     750 mg  100 mL/hr over 90 Minutes Intravenous  Once 11/30/15 2130 11/30/15 2327   11/30/15 2145  aztreonam (AZACTAM) 2 g in dextrose 5 % 50 mL IVPB  Status:  Discontinued     2 g 100 mL/hr over 30 Minutes Intravenous  Once 11/30/15 2130 11/30/15 2203       Assessment/Plan Recurrent SBO (small bowel obstruction) S/p EXPLORATORY LAPAROTOMY, LYSIS OF ADHESIONS, DEBRIDMENT OF SACRAL DECUBITUS ULCER, skin subcutaneoustissue and fascia 6 x 6 cm12/1 Dr. Johna Sheriff - POD 10 - tolerating small amounts of diet, having regular BM's  Hypotension - improving, tachycardia persists  Atrial fibrillation with RVR - on amiodarone Severe protein calorie malnutrition Dementia H/o stroke H/o DVT/PE  VTE - Eliquis FEN - soft diet  Plan -  BMP pending. hypotension improving but patient still tachycardic; on amiodarone per cardiology, labetalol PRN. Spoke with  internal medicine, since BP is improving they are going to try her on a low dose beta blocker. Continue BID wet to dry dressing changes to sacral wound Encourage mobilization with PT Per patient's daughter her mother plans to return to [facility] at discharge D/c pending beta blocker tolerance, labs   LOS: 24 days    [name] , [facility] Surgery 12/24/2015, 9:11 AM Pager: [phone] Consults: [phone] Mon-Fri 7:00 am-4:30 pm Sat-Sun 7:00 am-11:30 am

## 2015-12-24 NOTE — Care Management Important Message (Signed)
Important Message  Patient Details  Name: Brandi Heath MRN: [id number] Date of Birth: Sep 27, 1927   Medicare Important Message Given:  Yes    Haskell Flirt 12/24/2015, 2:48 PMImportant Message  Patient Details  Name: Brandi Heath MRN: [id number] Date of Birth: 1927/04/17   Medicare Important Message Given:  Yes    Haskell Flirt 12/24/2015, 2:48 PM

## 2015-12-24 NOTE — Clinical Social Work Note (Signed)
Patient admitted from [facility] where she is a LTC resident. MSW notified facility of possible discharge for today, 12/11.  Discharge packet prepared and placed on shadow chart in the event patient will dc today.  MSW remains available as needed.   Derenda Fennel, MSW [phone] 12/24/2015 1:00 PM

## 2015-12-24 NOTE — Progress Notes (Signed)
PROGRESS NOTE    Brandi Heath  WUJ:[id number] DOB: 05-03-1927 DOA: 11/30/2015 PCP: Merrilee Seashore, MD    Brief Narrative:  82 year old female with history of A. 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  Patient having recurrent small bowel obstructions (4 episodes over the last few weeks). Surgery consulted and patient taken to or on 12/1For laparotomy and lysis of adhesions.  stepdown unit postop.  Assessment & Plan:   Principal Problem:   SBO (small bowel obstruction) Active Problems:   OSA (obstructive sleep apnea)   Dementia with behavioral disturbance   Hypertensive heart disease with CHF (congestive heart failure) (HCC)   Atrial fibrillation with rapid ventricular response (HCC)   Acute encephalopathy   UTI (urinary tract infection)   History of CVA (cerebrovascular accident)   GERD (gastroesophageal reflux disease)   Anemia, chronic disease   Non-ischemic cardiomyopathy (HCC)   MR (mitral regurgitation)   Acute kidney injury (HCC)   Acute renal failure (ARF) (HCC)   Dehydration   Pressure injury of skin   Protein-calorie malnutrition, severe   Palliative care encounter   Goals of care, counseling/discussion   Chronic anticoagulation   Encounter for hospice care discussion   Hypokalemia   Hypomagnesemia   S/P exploratory laparotomy   Hypotension   Atrial fibrillation with RVR (HCC)    Principal Problem:   Recurrent SBO (small bowel obstruction) - patient with recurrent SBO, s/p laparotomy w/ lysis of adhesions on 12/1 - Pt reportedly tolerating PO. Advancing diet - Staff reports patient is taking only small amounts of PO - General surgery following. Discussed with Surgery today. Surgical issues are resolving. Discharge planning is in progress  Active Problems:   Atrial fibrillation with RVR (HCC) - Off cardizem gtt secondary to low BP -  Pt had been seen earlier by Cardiology. - On eliquis for secondary stroke prevention - Discussed with pharmacy. Per pharmacy, PO amiodarone has roughly 1% risk of associated hypotension. Recommend continued amiodarone as tolerated - have increased amiodarone to 200mg  in AM and 100mg  in PM - discussed with Cardiology, who will see in consultation. Additional recs per Cardiology  Hypotension - Patient reported to have over 10 large bowel movements recently. Suspect dehydration, thus agree with cautious hydration as tolerated - BP improved with IVF hydration  Sacral decubitus ulcer - s/p debridement - Will continue per surgery  Seizure disorder - Continue keppra as scheduled - Remains seizure free - Tolerating PO keppra  Acute kidney injury - Suspect secondary to dehydration - Presently on 100cc/hr - For the time being, will continue IVF as tolerated. - monitor closely for signs of volume overload  Hypokalemia/hypomagnesemia - Electrolytes reviewed - Potassium low today at 3.1 - will replace  Severe protein calorie malnutrition - Dietitian was consulted - remains stable  Anemia of chronic disease - Hgb trends noted - Hgb overall stable  Moderate  dementia - Patient remains stable - Patient appears oriented, has insight to her current medical situation (understands she had bowel obstruction and required surgery)  SOB - Recent CXR unremarkable - On min O2 support - Remains stable  Chronic systolic CHF - Most recent EF noted to be around 40-45% - Appears to be euvolemic at present -Would monitor closely for signs of volume overload  Hypomagnesemia - recently replaced - Would repeat Mg in AM  DVT prophylaxis: Eliquis Code Status: DNR Family Communication: Pt in room, family not at  bedside Disposition Plan: Per primary service   Procedures:  Laparotomy with lysis of adhesions on 12/1 Sacral ulcer debridement on 12/1.  Antimicrobials: Anti-infectives     Start     Dose/Rate Route Frequency Ordered Stop   12/14/15 0600  clindamycin (CLEOCIN) IVPB 900 mg  Status:  Discontinued     900 mg 100 mL/hr over 30 Minutes Intravenous On call to O.R. 12/13/15 1305 12/14/15 1618   12/14/15 0600  gentamicin (GARAMYCIN) 310 mg in dextrose 5 % 100 mL IVPB  Status:  Discontinued     5 mg/kg  61.4 kg 107.8 mL/hr over 60 Minutes Intravenous On call to O.R. 12/13/15 1305 12/14/15 1618   12/03/15 0830  fluconazole (DIFLUCAN) tablet 150 mg     150 mg Oral  Once 12/03/15 0828 12/03/15 0859   12/02/15 2200  levofloxacin (LEVAQUIN) IVPB 500 mg  Status:  Discontinued     500 mg 100 mL/hr over 60 Minutes Intravenous Every 48 hours 11/30/15 2202 12/03/15 1036   11/30/15 2230  aztreonam (AZACTAM) 500 mg in dextrose 5 % 50 mL IVPB  Status:  Discontinued     500 mg 100 mL/hr over 30 Minutes Intravenous Every 8 hours 11/30/15 2203 12/01/15 0732   11/30/15 2145  levofloxacin (LEVAQUIN) IVPB 750 mg     750 mg 100 mL/hr over 90 Minutes Intravenous  Once 11/30/15 2130 11/30/15 2327   11/30/15 2145  aztreonam (AZACTAM) 2 g in dextrose 5 % 50 mL IVPB  Status:  Discontinued     2 g 100 mL/hr over 30 Minutes Intravenous  Once 11/30/15 2130 11/30/15 2203      Subjective: Without complaints, denies abd pain  Objective: Vitals:   12/23/15 1649 12/23/15 2140 12/24/15 0647 12/24/15 1346  BP: 108/71 103/64 114/76 119/70  Pulse: (!) 122 (!) 117 (!) 120 (!) 124  Resp: 14 15 15 16   Temp: 98.4 F (36.9 C) 98.1 F (36.7 C) 99.2 F (37.3 C) 98.3 F (36.8 C)  TempSrc: Oral Oral Oral Oral  SpO2: 97% 98% 97% 99%  Weight:   65.2 kg (143 lb 11.8 oz)   Height:        Intake/Output Summary (Last 24 hours) at 12/24/15 1516 Last data filed at 12/24/15 1400  Gross per 24 hour  Intake          2953.33 ml  Output                0 ml  Net          2953.33 ml   Filed Weights   12/22/15 0520 12/23/15 0848 12/24/15 0647  Weight: 76.2 kg (167 lb 15.9 oz) 65 kg (143 lb 4.8 oz)  65.2 kg (143 lb 11.8 oz)    Examination:  General exam: sitting in chair, conversant, in nad Respiratory system: normal resp effort, no wheezing  Cardiovascular system: irregularly irregular, s1, s2 Gastrointestinal system: pos BS, soft, nontender Central nervous system: cn2-12 grossly intact, strength/sensation intact Extremities: no cyanosis, no joint deforities Skin: no rashes, no notable skin lesions are seen Psychiatry: affect normal// no auditory hallucinations   Data Reviewed: I have personally reviewed following labs and imaging studies  CBC:  Recent Labs Lab 12/19/15 0401 12/20/15 0410 12/21/15 0446 12/22/15 0328 12/23/15 0321  WBC 6.1 6.0 6.9 8.0 7.1  HGB 9.3* 8.9* 8.5* 8.9* 8.8*  HCT 26.0* 25.5* 24.2* 25.7* 25.1*  MCV 80.2 82.0 82.6 82.6 82.3  PLT 193 217 233 310 341   Basic Metabolic  Panel:  Recent Labs Lab 12/18/15 0535 12/19/15 0401 12/20/15 0410 12/21/15 0446 12/22/15 0328 12/23/15 0321 12/24/15 1019  NA 135 136 135 138 138  --  139  K 3.8 4.9 3.6 3.8 3.7  --  3.1*  CL 108 108 108 111 110  --  112*  CO2 22 22 22 23  21*  --  21*  GLUCOSE 131* 100* 89 97 115*  --  135*  BUN 26* 25* 22* 21* 16  --  11  CREATININE 0.92 0.91 0.89 1.02* 0.90  --  0.87  CALCIUM 8.5* 7.1* 8.4* 8.2* 8.7*  --  8.2*  MG 1.8 1.4* 1.7  --  1.5* 1.9  --   PHOS 3.4 3.1  --   --   --   --   --    GFR: Estimated Creatinine Clearance: 47.8 mL/min (by C-G formula based on SCr of 0.87 mg/dL). Liver Function Tests:  Recent Labs Lab 12/18/15 0535 12/21/15 0446  AST 15 13*  ALT 12* 10*  ALKPHOS 46 48  BILITOT 0.4 0.6  PROT 5.2* 5.1*  ALBUMIN 2.3* 2.1*   No results for input(s): LIPASE, AMYLASE in the last 168 hours. No results for input(s): AMMONIA in the last 168 hours. Coagulation Profile: No results for input(s): INR, PROTIME in the last 168 hours. Cardiac Enzymes: No results for input(s): CKTOTAL, CKMB, CKMBINDEX, TROPONINI in the last 168 hours. BNP (last 3  results) No results for input(s): PROBNP in the last 8760 hours. HbA1C: No results for input(s): HGBA1C in the last 72 hours. CBG:  Recent Labs Lab 12/19/15 2336 12/21/15 1158 12/21/15 1741 12/21/15 2355 12/22/15 0546  GLUCAP 81 106* 95 101* 101*   Lipid Profile: No results for input(s): CHOL, HDL, LDLCALC, TRIG, CHOLHDL, LDLDIRECT in the last 72 hours. Thyroid Function Tests: No results for input(s): TSH, T4TOTAL, FREET4, T3FREE, THYROIDAB in the last 72 hours. Anemia Panel: No results for input(s): VITAMINB12, FOLATE, FERRITIN, TIBC, IRON, RETICCTPCT in the last 72 hours. Sepsis Labs: No results for input(s): PROCALCITON, LATICACIDVEN in the last 168 hours.  No results found for this or any previous visit (from the past 240 hour(s)).   Radiology Studies: No results found.  Scheduled Meds: . amiodarone  100 mg Oral QHS  . [START ON 12/25/2015] amiodarone  200 mg Oral q morning - 10a  . apixaban  2.5 mg Oral BID  . chlorhexidine  15 mL Mouth Rinse BID  . donepezil  10 mg Oral QHS  . DULoxetine  60 mg Oral QHS  . feeding supplement  1 Container Oral TID WC  . feeding supplement (ENSURE ENLIVE)  237 mL Oral BID BM  . feeding supplement (PRO-STAT SUGAR FREE 64)  30 mL Oral TID BM  . latanoprost  1 drop Both Eyes QHS  . levETIRAcetam  750 mg Oral BID  . lip balm  1 application Topical BID  . mouth rinse  15 mL Mouth Rinse q12n4p  . metoprolol succinate  25 mg Oral Daily  . multivitamin  15 mL Oral Daily  . MUSCLE RUB   Topical QHS  . pantoprazole  40 mg Oral Daily  . potassium chloride  40 mEq Oral BID  . sodium chloride flush  10-40 mL Intracatheter Q12H  . sodium chloride flush  3 mL Intravenous Q12H  . vitamin C  500 mg Oral Daily   Continuous Infusions: . sodium chloride 100 mL/hr at 12/24/15 0555     LOS: 24 days   CHIU,  Scheryl Marten, MD [facility] Pager [phone]  If 7PM-7AM, please contact night-coverage [email or web address] Password  [email or web address] 12/24/2015, 3:16 PM

## 2015-12-24 NOTE — Progress Notes (Signed)
Patient Name: Brandi Heath Date of Encounter: 12/24/2015  Primary Cardiologist: Dr. Hanley [facility]  Hospital Problem List     Principal Problem:   SBO (small bowel obstruction) Active Problems:   OSA (obstructive sleep apnea)   Dementia with behavioral disturbance   Hypertensive heart disease with CHF (congestive heart failure) (HCC)   Atrial fibrillation with rapid ventricular response (HCC)   Acute encephalopathy   UTI (urinary tract infection)   History of CVA (cerebrovascular accident)   GERD (gastroesophageal reflux disease)   Anemia, chronic disease   Non-ischemic cardiomyopathy (HCC)   MR (mitral regurgitation)   Acute kidney injury (HCC)   Acute renal failure (ARF) (HCC)   Dehydration   Pressure injury of skin   Protein-calorie malnutrition, severe   Palliative care encounter   Goals of care, counseling/discussion   Chronic anticoagulation   Encounter for hospice care discussion   Hypokalemia   Hypomagnesemia   S/P exploratory laparotomy   Hypotension   Atrial fibrillation with RVR (HCC)     Subjective   No CP, no SOB  Inpatient Medications    Scheduled Meds: . amiodarone  100 mg Oral QHS  . [START ON 12/25/2015] amiodarone  200 mg Oral q morning - 10a  . apixaban  2.5 mg Oral BID  . chlorhexidine  15 mL Mouth Rinse BID  . donepezil  10 mg Oral QHS  . DULoxetine  60 mg Oral QHS  . feeding supplement  1 Container Oral TID WC  . feeding supplement (ENSURE ENLIVE)  237 mL Oral BID BM  . feeding supplement (PRO-STAT SUGAR FREE 64)  30 mL Oral TID BM  . latanoprost  1 drop Both Eyes QHS  . levETIRAcetam  750 mg Oral BID  . lip balm  1 application Topical BID  . mouth rinse  15 mL Mouth Rinse q12n4p  . multivitamin  15 mL Oral Daily  . MUSCLE RUB   Topical QHS  . pantoprazole  40 mg Oral Daily  . potassium chloride  40 mEq Oral BID  . sodium chloride flush  10-40 mL Intracatheter Q12H  . sodium chloride flush  3 mL Intravenous Q12H    . vitamin C  500 mg Oral Daily   Continuous Infusions: . sodium chloride 100 mL/hr at 12/24/15 0555   PRN Meds: magic mouthwash, menthol-cetylpyridinium, morphine injection, [DISCONTINUED] ondansetron **OR** ondansetron (ZOFRAN) IV, phenol, sodium chloride flush   Vital Signs    Vitals:   12/23/15 0848 12/23/15 1649 12/23/15 2140 12/24/15 0647  BP:  108/71 103/64 114/76  Pulse:  (!) 122 (!) 117 (!) 120  Resp:  14 15 15   Temp:  98.4 F (36.9 C) 98.1 F (36.7 C) 99.2 F (37.3 C)  TempSrc:  Oral Oral Oral  SpO2:  97% 98% 97%  Weight: 143 lb 4.8 oz (65 kg)   143 lb 11.8 oz (65.2 kg)  Height:        Intake/Output Summary (Last 24 hours) at 12/24/15 1206 Last data filed at 12/24/15 1017  Gross per 24 hour  Intake             2425 ml  Output              200 ml  Net             2225 ml   Filed Weights   12/22/15 0520 12/23/15 0848 12/24/15 0647  Weight: 167 lb 15.9 oz (76.2 kg) 143 lb 4.8 oz (65 kg) 143  lb 11.8 oz (65.2 kg)    Physical Exam    GEN: Well nourished, well developed, in no acute distress.  HEENT: Grossly normal.  Neck: Supple, no JVD, carotid bruits, or masses. Cardiac: RRR, no murmurs, rubs, or gallops. No clubbing, cyanosis, edema.  Radials/DP/PT 2+ and equal bilaterally.  Respiratory:  Respirations regular and unlabored, clear to auscultation bilaterally. GI: Soft, nontender, nondistended, BS + x 4. MS: no deformity or atrophy. Skin: warm and dry, no rash. Neuro:  Strength and sensation are intact. Psych: AAOx3.  Normal affect.  Labs    CBC  Recent Labs  12/22/15 0328 12/23/15 0321  WBC 8.0 7.1  HGB 8.9* 8.8*  HCT 25.7* 25.1*  MCV 82.6 82.3  PLT 310 341   Basic Metabolic Panel  Recent Labs  12/22/15 0328 12/23/15 0321 12/24/15 1019  NA 138  --  139  K 3.7  --  3.1*  CL 110  --  112*  CO2 21*  --  21*  GLUCOSE 115*  --  135*  BUN 16  --  11  CREATININE 0.90  --  0.87  CALCIUM 8.7*  --  8.2*  MG 1.5* 1.9  --    Liver Function  Tests No results for input(s): AST, ALT, ALKPHOS, BILITOT, PROT, ALBUMIN in the last 72 hours. No results for input(s): LIPASE, AMYLASE in the last 72 hours. Cardiac Enzymes No results for input(s): CKTOTAL, CKMB, CKMBINDEX, TROPONINI in the last 72 hours. BNP Invalid input(s): POCBNP D-Dimer No results for input(s): DDIMER in the last 72 hours. Hemoglobin A1C No results for input(s): HGBA1C in the last 72 hours. Fasting Lipid Panel No results for input(s): CHOL, HDL, LDLCALC, TRIG, CHOLHDL, LDLDIRECT in the last 72 hours. Thyroid Function Tests No results for input(s): TSH, T4TOTAL, T3FREE, THYROIDAB in the last 72 hours.  Invalid input(s): FREET3  Telemetry    Previously AFIB RVR - Personally Reviewed  ECG    AFIB RVR 120's - Personally Reviewed  Radiology    No results found.  Cardiac Studies   - Left ventricle: The cavity size was normal. Systolic function was   mildly to moderately reduced. The estimated ejection fraction was   in the range of 40% to 45%.  Patient Profile     82 year old with AFIB RVR, SBP, EF 40%, anemia.   Assessment & Plan    1. Recurrent SBO s/p surgery 12/14/15 - per surgical team/IM. Per palliative consults from several weeks ago, recommendation has been made for palliative to follow patient when she returns to SNF given recurrent hospitalizations.  2. AKI -resolved  3. Anemia of chronic disease - has remained generally stable since 11/2015, will need to be followed on anticoag. Hg 8.8  4. Chronic atrial fib with mild RVR - on AMIO low dose PO. Will add Toprol XL 25mg  QD to assist with rate control. On Eliquis 2.5 BID  5. Presumed NICM/chronic systolic CHF with mod-severe MR and pulm HTN -  Unable to optimize HF meds due to hypotension.  Signed, Donato Schultz, MD  12/24/2015, 12:06 PM

## 2015-12-25 LAB — BASIC METABOLIC PANEL
ANION GAP: 3 — AB (ref 5–15)
BUN: 14 mg/dL (ref 6–20)
CHLORIDE: 117 mmol/L — AB (ref 101–111)
CO2: 22 mmol/L (ref 22–32)
Calcium: 7.9 mg/dL — ABNORMAL LOW (ref 8.9–10.3)
Creatinine, Ser: 0.78 mg/dL (ref 0.44–1.00)
GFR calc Af Amer: >60 mL/min (ref >60)
GFR calc non Af Amer: >60 mL/min (ref >60)
Glucose, Bld: 101 mg/dL — ABNORMAL HIGH (ref 65–99)
POTASSIUM: 3.4 mmol/L — AB (ref 3.5–5.1)
Sodium: 142 mmol/L (ref 135–145)

## 2015-12-25 LAB — MAGNESIUM: Magnesium: 1.4 mg/dL — ABNORMAL LOW (ref 1.7–2.4)

## 2015-12-25 LAB — CBC
HCT: 23.7 % — ABNORMAL LOW (ref 36.0–46.0)
HEMOGLOBIN: 8.1 g/dL — AB (ref 12.0–15.0)
MCH: 28.6 pg (ref 26.0–34.0)
MCHC: 34.2 g/dL (ref 30.0–36.0)
MCV: 83.7 fL (ref 78.0–100.0)
Platelets: 345 10*3/uL (ref 150–400)
RBC: 2.83 MIL/uL — AB (ref 3.87–5.11)
RDW: 16.3 % — ABNORMAL HIGH (ref 11.5–15.5)
WBC: 5.9 10*3/uL (ref 4.0–10.5)

## 2015-12-25 MED ORDER — LATANOPROST 0.005 % OP SOLN: 1.0000 [drp] | Freq: Every day | OPHTHALMIC | 12 refills | Status: AC

## 2015-12-25 MED ORDER — MAGNESIUM OXIDE 400 (241.3 MG) MG PO TABS
400.0000 mg | ORAL_TABLET | Freq: Two times a day (BID) | ORAL | Status: DC
  Administered 2015-12-25: 400 mg via ORAL
  Filled 2015-12-25: qty 1

## 2015-12-25 MED ORDER — AMIODARONE HCL 200 MG PO TABS: 200.0000 mg | ORAL_TABLET | Freq: Every morning | ORAL | Status: AC

## 2015-12-25 MED ORDER — POTASSIUM CHLORIDE CRYS ER 20 MEQ PO TBCR
40.0000 meq | EXTENDED_RELEASE_TABLET | Freq: Two times a day (BID) | ORAL | Status: DC
  Administered 2015-12-25: 40 meq via ORAL
  Filled 2015-12-25: qty 2

## 2015-12-25 MED ORDER — METOPROLOL SUCCINATE ER 25 MG PO TB24: 25.0000 mg | ORAL_TABLET | Freq: Every day | ORAL | Status: AC

## 2015-12-25 MED ORDER — PRO-STAT SUGAR FREE PO LIQD: 30.0000 mL | Freq: Four times a day (QID) | ORAL | 0 refills | Status: AC

## 2015-12-25 MED ORDER — OMEPRAZOLE 40 MG PO CPDR: 40.0000 mg | DELAYED_RELEASE_CAPSULE | Freq: Every day | ORAL | Status: AC

## 2015-12-25 MED ORDER — PRO-STAT SUGAR FREE PO LIQD
30.0000 mL | Freq: Four times a day (QID) | ORAL | Status: DC
  Administered 2015-12-25: 30 mL via ORAL
  Filled 2015-12-25: qty 30

## 2015-12-25 NOTE — Discharge Summary (Signed)
[facility] Discharge Summary   Patient ID: Brandi Heath MRN: [id number] DOB/AGE: 80-Feb-1929 80 y.o.  Admit date: 11/30/2015 Discharge date: 12/25/2015  Admitting Diagnosis: Small bowel obstruction   Discharge Diagnosis Patient Active Problem List   Diagnosis Date Noted  . Atrial fibrillation with RVR (HCC)   . Hypotension   . S/P exploratory laparotomy 12/14/2015  . Hypokalemia   . Hypomagnesemia   . Chronic anticoagulation 12/06/2015  . Encounter for hospice care discussion   . Palliative care encounter   . Goals of care, counseling/discussion   . Protein-calorie malnutrition, severe 12/03/2015  . Pressure injury of skin 12/01/2015  . Acute renal failure (ARF) (HCC) 11/30/2015  . Dehydration 11/30/2015  . Acute kidney injury (HCC) 11/14/2015  . Non-ischemic cardiomyopathy (HCC) 11/11/2015  . MR (mitral regurgitation) 11/11/2015  . Acute respiratory failure with hypoxia (HCC) 10/17/2015  . Slurred speech   . Left sided numbness   . Acute on chronic diastolic heart failure (HCC) 10/04/2015  . Normocytic anemia 10/04/2015  . Thrombocytopenia (HCC) 10/04/2015  . TIA (transient ischemic attack) 10/04/2015  . Cough 10/02/2015  . SBO (small bowel obstruction) 10/01/2015  . Chronic CHF (congestive heart failure) (HCC) 09/29/2015  . Venous insufficiency (chronic) (peripheral) 09/29/2015  . Vitamin B12 deficiency 09/29/2015  . Anemia, chronic disease 09/29/2015  . Enterococcus UTI 09/02/2015  . GERD (gastroesophageal reflux disease) 09/02/2015  . Adynamic ileus (HCC) 06/11/2015  . CHF, acute on chronic (HCC) 06/11/2015  . Personal history of DVT (deep vein thrombosis) 06/11/2015  . Non-intractable vomiting with nausea 06/03/2015  . Abdominal aortic aneurysm without rupture (HCC) 05/31/2015  . Jaw pain 05/27/2015  . E. coli UTI 04/22/2015  . Prerenal acute renal failure (HCC) 04/22/2015  . History of CVA (cerebrovascular accident) 04/22/2015  . AF (atrial  fibrillation) (HCC) 04/22/2015  . Polyneuropathy (HCC) 04/22/2015  . Edema 01/25/2015  . Atrial fibrillation with rapid ventricular response (HCC) 01/20/2015  . Acute encephalopathy 01/20/2015  . UTI (urinary tract infection) 01/20/2015  . Tracheobronchomalacia 01/20/2015  . Personal history of PE (pulmonary embolism) 01/20/2015  . Depression 01/20/2015  . OSA (obstructive sleep apnea)   . Dementia with behavioral disturbance   . Seizures (HCC)   . Hyperlipidemia   . Hypertensive heart disease with CHF (congestive heart failure) (HCC)   . Clotting disorder (HCC)   . Cerebrovascular accident (CVA) due to thrombosis [facility])     Consultants Brandi Carrow Md, cardiology Brandi Neale Burly Md, palliative care  Imaging: CT abdomen pelvis wo contrast 11/30/15: Distal small bowel obstruction, likely related to adhesions. Moderate-sized fluid-filled hiatal hernia. Coronary artery disease, aortic atherosclerosis. Prior stent graft repair of AAA. Decreasing aneurysm sac size since prior study.  Procedures Dr. Johna Heath (12/14/15) - EXPLORATORY LAPAROTOMY, LYSIS OF ADHESIONS, DEBRIDMENT OF SACRAL DECUBITUS ULCER, skin subcutaneoustissue and fascia 6 x 6 cm  Hospital Course:  Brandi Heath is an 82yo female PMH atrial fibrillation with RVR on Eliquis, CAD, CHF, dementia, h/o stroke, and h/o DVT/PE who was admitted to [facility] with a recurrent SBO as well as UTI. UTI treated with levofloxacin. Initially thought be improving, but unfortunately Brandi Heath failed conservative management of SBO; due to multiple medical problems and high risk for surgery palliative care was consulted to establish goals of care. Family decided to proceed with surgical intervention despite risks. Patient underwent the above procedure and tolerated it well. She was extubated post-procedure and transferred to the ICU on cardizem for monitoring. She had an ileus postoperatively as expected and therefore  kept on TPN until bowel  function returned. On POD4 her diet began to be advanced as tolerated. Cardizem stopped due to hypotension and she was transitioned to low-dose amiodarone drip. Initially on heparin IV for anticoagulation and was transitioned back to Eliquis once tolerating PO. Patient did again have some hypotension postoperatively but this responded to fluid boluses. Low dose of metoprolol was started 12/25/15 for better hear rate control and she tolerated this well. Patient worked with PT during admission. On POD11 the patient was voiding well, tolerating diet, bowels functioning, pain well controlled, vital signs stable, incisions c/d/i and felt stable for discharge home.  Abdominal staples have been removed and she may shower normally. She does need to continue wet to dry dressing changes twice daily to her sacral wound. Per cardiology she should continue 200mg  Amiodarone daily as well as metoprolol 25mg  daily for BP and HR. Patient will follow up with general surgery in about 3 weeks.  Physical Exam: Gen: Alert, NAD, pleasant Card: Irregular, tachycardic Pulm: CTAB, unlabored Abd: Soft, NT/ND, midline abdominal incision C/D/I with staples intact >> staples removed Sacrum: 6x6cm wound s/p debridement healing well with granulating tissue, no discharge or foul odor    Medication List    STOP taking these medications   ciprofloxacin 250 MG tablet Commonly known as:  CIPRO   diltiazem 360 MG 24 hr capsule Commonly known as:  TIAZAC   docusate sodium 100 MG capsule Commonly known as:  COLACE   fluconazole 150 MG tablet Commonly known as:  DIFLUCAN   furosemide 40 MG tablet Commonly known as:  LASIX   FUSION PLUS Caps   guaifenesin 100 MG/5ML syrup Commonly known as:  ROBITUSSIN   LORazepam 2 MG/ML injection Commonly known as:  ATIVAN   magnesium oxide 400 (241.3 Mg) MG tablet Commonly known as:  MAG-OX   metoprolol 100 MG tablet Commonly known as:  LOPRESSOR   PHENERGAN 25 MG  suppository Generic drug:  promethazine   potassium chloride SA 20 MEQ tablet Commonly known as:  K-DUR,KLOR-CON   risperiDONE 1 MG tablet Commonly known as:  RISPERDAL     TAKE these medications   amiodarone 200 MG tablet Commonly known as:  PACERONE Take 1 tablet (200 mg total) by mouth every morning. Start taking on:  12/26/2015   atorvastatin 20 MG tablet Commonly known as:  LIPITOR Take 20 mg by mouth at bedtime.   calcium-vitamin D 500-200 MG-UNIT tablet Take 1 tablet by mouth daily.   Cranberry 450 MG Tabs Take 450 mg by mouth 2 (two) times daily.   donepezil 10 MG tablet Commonly known as:  ARICEPT Take 10 mg by mouth at bedtime.   DULoxetine 60 MG capsule Commonly known as:  CYMBALTA Take 60 mg by mouth at bedtime.   ELIQUIS 2.5 MG Tabs tablet Generic drug:  apixaban Take 2.5 mg by mouth 2 (two) times daily.   ENSURE ENLIVE PO Take 1 Can by mouth daily before breakfast.   feeding supplement (PRO-STAT SUGAR FREE 64) Liqd Take 30 mLs by mouth 4 (four) times daily.   latanoprost 0.005 % ophthalmic solution Commonly known as:  XALATAN Place 1 drop into both eyes at bedtime.   levETIRAcetam 750 MG tablet Commonly known as:  KEPPRA Take 1 tablet (750 mg total) by mouth 2 (two) times daily.   metoprolol succinate 25 MG 24 hr tablet Commonly known as:  TOPROL-XL Take 1 tablet (25 mg total) by mouth daily. Start taking on:  12/26/2015  multivitamin with minerals tablet Take 1 tablet by mouth daily.   MUSCLE RUB 10-15 % Crea Apply 1 application topically 3 (three) times daily as needed (for leg pain).   omeprazole 40 MG capsule Commonly known as:  PRILOSEC Take 1 capsule (40 mg total) by mouth daily. What changed:  when to take this   travoprost (benzalkonium) 0.004 % ophthalmic solution Commonly known as:  TRAVATAN Place 1 drop into both eyes at bedtime.   TYLENOL 325 MG tablet Generic drug:  acetaminophen Take 650 mg by mouth 3 (three) times  daily. pain   vitamin C 500 MG tablet Commonly known as:  ASCORBIC ACID Take 500 mg by mouth daily.        Follow-up Information    HOXWORTH,BENJAMIN T, MD. Call in 3 week(s).   Specialty:  General Surgery Why:  3 weeks with Dr. Johna SheriffHoxworth for follow-up from your surgery. Our office is working on your appointment, please call to confirm.  Contact information: [address] [place] [address] [phone]        Merrilee SeashoreAnne Alexander, MD. Call.   Specialty:  Internal Medicine Contact information: [address] [place] [address] [phone]           Signed: Edson SnowballBROOKE A MILLER, [facility] 12/25/2015, 1:59 PM Pager: [phone] Consults: [phone] Mon-Fri 7:00 am-4:30 pm Sat-Sun 7:00 am-11:30 am

## 2015-12-25 NOTE — Progress Notes (Signed)
Patient ID: Brandi Heath, female   DOB: 08-06-1927, 82 y.o.   MRN: [id number]  [facility] Surgery Progress Note  11 Days Post-Op  Subjective: States that her buttock wound still bothers her but otherwise no complaints. Denies abdominal pain. Tolerating diet and having regular BMs.  Objective: Vital signs in last 24 hours: Temp:  [98.3 F (36.8 C)-98.6 F (37 C)] 98.6 F (37 C) (12/12 0529) Pulse Rate:  [102-124] 113 (12/12 0529) Resp:  [14-16] 14 (12/12 0529) BP: (99-119)/(69-74) 99/72 (12/12 0529) SpO2:  [99 %-100 %] 100 % (12/12 0529) Last BM Date: 12/23/15  Intake/Output from previous day: 12/11 0701 - 12/12 0700 In: 2148.3 [P.O.:300; I.V.:1848.3] Out: 2 [Urine:2] Intake/Output this shift: Total I/O In: 100 [P.O.:100] Out: -   PE: Gen:  Alert, NAD, pleasant Card:  Irregular, tachycardic Pulm:  CTAB, unlabored Abd: Soft, NT/ND, midline abdominal incision C/D/I with staples intact >> staples removed  Lab Results:   Recent Labs  12/23/15 0321 12/25/15 0430  WBC 7.1 5.9  HGB 8.8* 8.1*  HCT 25.1* 23.7*  PLT 341 345   BMET  Recent Labs  12/24/15 1019 12/25/15 0430  NA 139 142  K 3.1* 3.4*  CL 112* 117*  CO2 21* 22  GLUCOSE 135* 101*  BUN 11 14  CREATININE 0.87 0.78  CALCIUM 8.2* 7.9*   PT/INR No results for input(s): LABPROT, INR in the last 72 hours. CMP     Component Value Date/Time   NA 142 12/25/2015 0430   NA 142 10/30/2015   K 3.4 (L) 12/25/2015 0430   CL 117 (H) 12/25/2015 0430   CO2 22 12/25/2015 0430   GLUCOSE 101 (H) 12/25/2015 0430   BUN 14 12/25/2015 0430   BUN 9 10/30/2015   CREATININE 0.78 12/25/2015 0430   CALCIUM 7.9 (L) 12/25/2015 0430   PROT 5.1 (L) 12/21/2015 0446   ALBUMIN 2.1 (L) 12/21/2015 0446   AST 13 (L) 12/21/2015 0446   ALT 10 (L) 12/21/2015 0446   ALKPHOS 48 12/21/2015 0446   BILITOT 0.6 12/21/2015 0446   GFRNONAA >60 12/25/2015 0430   GFRAA >60 12/25/2015 0430   Lipase     Component Value Date/Time    LIPASE 37 12/03/2015 0645       Studies/Results: No results found.  Anti-infectives: Anti-infectives    Start     Dose/Rate Route Frequency Ordered Stop   12/14/15 0600  clindamycin (CLEOCIN) IVPB 900 mg  Status:  Discontinued     900 mg 100 mL/hr over 30 Minutes Intravenous On call to O.R. 12/13/15 1305 12/14/15 1618   12/14/15 0600  gentamicin (GARAMYCIN) 310 mg in dextrose 5 % 100 mL IVPB  Status:  Discontinued     5 mg/kg  61.4 kg 107.8 mL/hr over 60 Minutes Intravenous On call to O.R. 12/13/15 1305 12/14/15 1618   12/03/15 0830  fluconazole (DIFLUCAN) tablet 150 mg     150 mg Oral  Once 12/03/15 0828 12/03/15 0859   12/02/15 2200  levofloxacin (LEVAQUIN) IVPB 500 mg  Status:  Discontinued     500 mg 100 mL/hr over 60 Minutes Intravenous Every 48 hours 11/30/15 2202 12/03/15 1036   11/30/15 2230  aztreonam (AZACTAM) 500 mg in dextrose 5 % 50 mL IVPB  Status:  Discontinued     500 mg 100 mL/hr over 30 Minutes Intravenous Every 8 hours 11/30/15 2203 12/01/15 0732   11/30/15 2145  levofloxacin (LEVAQUIN) IVPB 750 mg     750 mg 100 mL/hr over 90  Minutes Intravenous  Once 11/30/15 2130 11/30/15 2327   11/30/15 2145  aztreonam (AZACTAM) 2 g in dextrose 5 % 50 mL IVPB  Status:  Discontinued     2 g 100 mL/hr over 30 Minutes Intravenous  Once 11/30/15 2130 11/30/15 2203       Assessment/Plan Recurrent SBO (small bowel obstruction) S/p EXPLORATORY LAPAROTOMY, LYSIS OF ADHESIONS, DEBRIDMENT OF SACRAL DECUBITUS ULCER, skin subcutaneoustissue and fascia 6 x 6 cm12/1 Dr. Johna SheriffHoxworth - POD 11 - tolerating small amounts of diet, having regular BM's  Hypotension - improving, tachycardia persists  Atrial fibrillation with RVR - on amiodarone, metoprolol Severe protein calorie malnutrition Dementia H/o stroke H/o DVT/PE  VTE - Eliquis FEN - soft diet  Plan - continue electrolyte replacement. Staples removed, ok to bathe normally and does not need a dressing. ready for  discharge from a surgical standpoint with OP follow-up with Dr. Johna SheriffHoxworth in about 3 weeks. D/c pending rate control with cardiology/internal medicine >> appreciate recommendations. Continue BID wet to dry dressing changes to sacral wound Encourage mobilization with PT Per patient's daughter her [facility]   LOS: 25 days    Edson SnowballBROOKE A MILLER , [facility] 12/25/2015, 9:33 AM Pager: [phone] Consults: [phone] Mon-Fri 7:00 am-4:30 pm Sat-Sun 7:00 am-11:30 am

## 2015-12-25 NOTE — Progress Notes (Signed)
Patient Name: Brandi Heath Date of Encounter: 12/25/2015  Primary Cardiologist: Dr. [name] [facility])  Hospital Problem List     Principal Problem:   SBO (small bowel obstruction) Active Problems:   OSA (obstructive sleep apnea)   Dementia with behavioral disturbance   Hypertensive heart disease with CHF (congestive heart failure) (HCC)   Atrial fibrillation with rapid ventricular response (HCC)   Acute encephalopathy   UTI (urinary tract infection)   History of CVA (cerebrovascular accident)   GERD (gastroesophageal reflux disease)   Anemia, chronic disease   Non-ischemic cardiomyopathy (HCC)   MR (mitral regurgitation)   Acute kidney injury (HCC)   Acute renal failure (ARF) (HCC)   Dehydration   Pressure injury of skin   Protein-calorie malnutrition, severe   Palliative care encounter   Goals of care, counseling/discussion   Chronic anticoagulation   Encounter for hospice care discussion   Hypokalemia   Hypomagnesemia   S/P exploratory laparotomy   Hypotension   Atrial fibrillation with RVR (HCC)     Subjective   Pt sleeping when I entered room. No complaints when awakened  Inpatient Medications    Scheduled Meds: . amiodarone  100 mg Oral QHS  . amiodarone  200 mg Oral q morning - 10a  . apixaban  2.5 mg Oral BID  . chlorhexidine  15 mL Mouth Rinse BID  . donepezil  10 mg Oral QHS  . DULoxetine  60 mg Oral QHS  . feeding supplement  1 Container Oral TID WC  . feeding supplement (ENSURE ENLIVE)  237 mL Oral BID BM  . feeding supplement (PRO-STAT SUGAR FREE 64)  30 mL Oral TID BM  . latanoprost  1 drop Both Eyes QHS  . levETIRAcetam  750 mg Oral BID  . lip balm  1 application Topical BID  . mouth rinse  15 mL Mouth Rinse q12n4p  . metoprolol succinate  25 mg Oral Daily  . multivitamin  15 mL Oral Daily  . MUSCLE RUB   Topical QHS  . pantoprazole  40 mg Oral Daily  . sodium chloride flush  10-40 mL Intracatheter Q12H  . sodium chloride  flush  3 mL Intravenous Q12H  . vitamin C  500 mg Oral Daily   Continuous Infusions: . sodium chloride 100 mL/hr (12/25/15 0005)   PRN Meds: magic mouthwash, menthol-cetylpyridinium, morphine injection, [DISCONTINUED] ondansetron **OR** ondansetron (ZOFRAN) IV, phenol, sodium chloride flush   Vital Signs    Vitals:   12/24/15 1346 12/24/15 1549 12/24/15 2208 12/25/15 0529  BP: 119/70 118/74 99/69 99/72   Pulse: (!) 124 (!) 102 (!) 110 (!) 113  Resp: 16 16 14 14   Temp: 98.3 F (36.8 C)  98.5 F (36.9 C) 98.6 F (37 C)  TempSrc: Oral  Oral Oral  SpO2: 99% 99% 100% 100%  Weight:      Height:        Intake/Output Summary (Last 24 hours) at 12/25/15 0743 Last data filed at 12/25/15 0500  Gross per 24 hour  Intake          2148.33 ml  Output                2 ml  Net          2146.33 ml   Filed Weights   12/22/15 0520 12/23/15 0848 12/24/15 0647  Weight: 167 lb 15.9 oz (76.2 kg) 143 lb 4.8 oz (65 kg) 143 lb 11.8 oz (65.2 kg)    Physical Exam  GEN: Well nourished, well developed, in no acute distress.  HEENT: Grossly normal.  Neck: Supple, no JVD, carotid bruits, or masses. Cardiac: irreg irreg, no murmur Respiratory:  Respirations regular and unlabored. MS: no deformity or atrophy. Skin: warm and dry, no rash. Neuro:  Strength and sensation are intact. Psych: AAOx3.  Normal affect.  Labs    CBC  Recent Labs  12/23/15 0321 12/25/15 0430  WBC 7.1 5.9  HGB 8.8* 8.1*  HCT 25.1* 23.7*  MCV 82.3 83.7  PLT 341 345   Basic Metabolic Panel  Recent Labs  12/23/15 0321 12/24/15 1019 12/25/15 0430  NA  --  139 142  K  --  3.1* 3.4*  CL  --  112* 117*  CO2  --  21* 22  GLUCOSE  --  135* 101*  BUN  --  11 14  CREATININE  --  0.87 0.78  CALCIUM  --  8.2* 7.9*  MG 1.9  --  1.4*     Telemetry    Previously AFIB RVR - Personally Reviewed  ECG    AFIB RVR 120's - Personally Reviewed  Radiology    CXR 12/20/15 COMPARISON:   12/02/2015  FINDINGS: Borderline cardiomegaly. Mild elevation of the right hemidiaphragm. No infiltrate or pulmonary edema. Mild infrahilar bronchitic changes. Large hiatal hernia is noted. Stable left basilar scarring. Right PICC line is unchanged in position.  IMPRESSION: No infiltrate or pulmonary edema. Infrahilar bronchitic changes. Large hiatal hernia.    Cardiac Studies   - Left ventricle: The cavity size was normal. Systolic function was   mildly to moderately reduced. The estimated ejection fraction was   in the range of 40% to 45%.  Patient Profile     82 year old with AFIB RVR, SBP, EF 40%, anemia.   Assessment & Plan    1. Recurrent SBO s/p surgery 12/14/15 - per surgical team/IM. Per palliative consults from several weeks ago, recommendation has been made for palliative to follow patient when she returns to SNF given recurrent hospitalizations.  2. AKI -resolved  3. Anemia of chronic disease - has remained generally stable since 11/2015, will need to be followed on anticoag. Hg 8.8  4. Chronic atrial fib with mild RVR - on AMIO 200 mg in AM, 100 mg in PM.      Toprol XL 25mg  QD added 12/24/15 to assist with rate control. On Eliquis 2.5 BID  5. Presumed NICM/chronic systolic CHF with mod-severe MR and pulm HTN -  Unable to optimize HF meds due to hypotension.  Plan: I took her pulse at bedside this AM- rates in the 80s and she has not had her Toprol yet. Not sure I would send her out on 300 mg Amiodarone daily- will discuss with MD.  Signed, Corine ShelterLuke Kilroy, PA-C  12/25/2015, 7:43 AM   Personally seen and examined. Agree with above. OK with DC Send out on 200mg  Amio PO QD.  Continue low dose metoprolol 25.   Donato SchultzMark Jeffifer Rabold, MD

## 2015-12-25 NOTE — Clinical Social Work Note (Signed)
Medical Social Worker facilitated patient discharge including contacting patient family (dtr, Babette Relic) and facility to confirm patient discharge plans.  Clinical information faxed to facility and family agreeable with plan.  MSW arranged ambulance transport via [facility] to [facility].  RN to call report prior to discharge.  Medical Social Worker will sign off for now as social work intervention is no longer needed. Please consult [place] again if new need arises.  Derenda Fennel, MSW [phone] 12/25/2015 2:48 PM

## 2015-12-25 NOTE — Progress Notes (Signed)
picc line discontinued  Per IV team. Sacral dressing changed . Patient ate small amount lunch.report called to Tora Perches at [facility].

## 2015-12-25 NOTE — Progress Notes (Signed)
Nutrition Follow-up  DOCUMENTATION CODES:   Severe malnutrition in context of acute illness/injury, Underweight  INTERVENTION:   -D/c Ensure supplements -Continue Boost Breeze po TID, each supplement provides 250 kcal and 9 grams of protein. Also changed to be given with meals.  -Increase Pro-Stat 30 ml QID, each supplement provides 100 kcal and 15 grams of protein. Mixed in patient's beverage of choice.  -Continue daily liquid multivitamin with minerals.  Continue to encourage PO intake of meals and supplements to patient. Discussed importance of adequate calories and protein to promote wound healing and to maintain muscle mass.  NUTRITION DIAGNOSIS:   Inadequate oral intake related to poor appetite as evidenced by per patient/family report.  Now related to poor appetite as evidenced by pt report.  GOAL:   Patient will meet greater than or equal to 90% of their needs  Progressing.  MONITOR:   PO intake, Supplement acceptance, Labs, Weight trends, Skin, I & O's  ASSESSMENT:   82 y.o. female with medical history significant of  CAD, dementia, DVT, OSA, D, atrial fibrillation on anticoagulation, recurrent small bowel obstruction, CHF nonischemic cardiomyopathy with mitral regurgitation, seizure disorder, recent stroke, As with abdominal pain, vomiting and altered mental status. 12/1: s/p laparotomy with lysis of adhesions  12/5: TPN d/c  Patient reports drinking some water and juice this morning. Pt states she did not eat anything yet today. PO intake documented: 15%.  12/11: 25% of lunch  Pt is taking Prostat supplement consistently. Will increase order to QID. Pt states she will try and drink her Boost Breeze today. Encouraged pt to sip throughout the day on supplements to provide kcal and protein.  If patent consumes Boost Breeze TID and Prostat QID this would provide 1150 kcal (85% of needs) and 87g protein (100% of needs).  Medications: MAG-OX tablet BID, Liquid  MVI daily, K-DUR tablet BID, Vitamin C tablet daily Labs reviewed: Low K, Mg  Diet Order:  DIET SOFT Room service appropriate? Yes; Fluid consistency: Thin  Skin:  Wound (see comment) (Unstageable, full thickness sacral pressure injury)  Last BM:  12/10  Height:   Ht Readings from Last 1 Encounters:  12/01/15 5\' 9"  (1.753 m)    Weight:   Wt Readings from Last 1 Encounters:  12/24/15 143 lb 11.8 oz (65.2 kg)    Ideal Body Weight:  65.9 kg  BMI:  Body mass index is 21.23 kg/m.  Estimated Nutritional Needs:   Kcal:  1350-1600  Protein:  75-85g  Fluid:  1.5-1.7L/day  EDUCATION NEEDS:   No education needs identified at this time  Tilda Franco, MS, RD, LDN Pager: [phone] After Hours Pager: [phone]

## 2015-12-26 ENCOUNTER — Telehealth: Payer: Self-pay

## 2015-12-26 NOTE — Telephone Encounter (Signed)
Possible re-admission to facility. This is a patient you were seeing at [facility] . [facility] F/U is needed if patient was re-admitted to facility upon discharge. Hospital discharge from [facility] on 12/25/15.

## 2016-03-13 DEATH — deceased

## 2018-03-15 IMAGING — CR DG ABDOMEN ACUTE W/ 1V CHEST
4 series · 4 of 4 positions shown · non-contrast
Comparison: Chest radiograph 10/07/2015. CT abdomen/ pelvis
10/01/2015

CLINICAL DATA: Emesis. Recent small bowel obstruction. Evaluate for
pneumonia.

EXAM:
DG ABDOMEN ACUTE W/ 1V CHEST

[w abdomen decub]
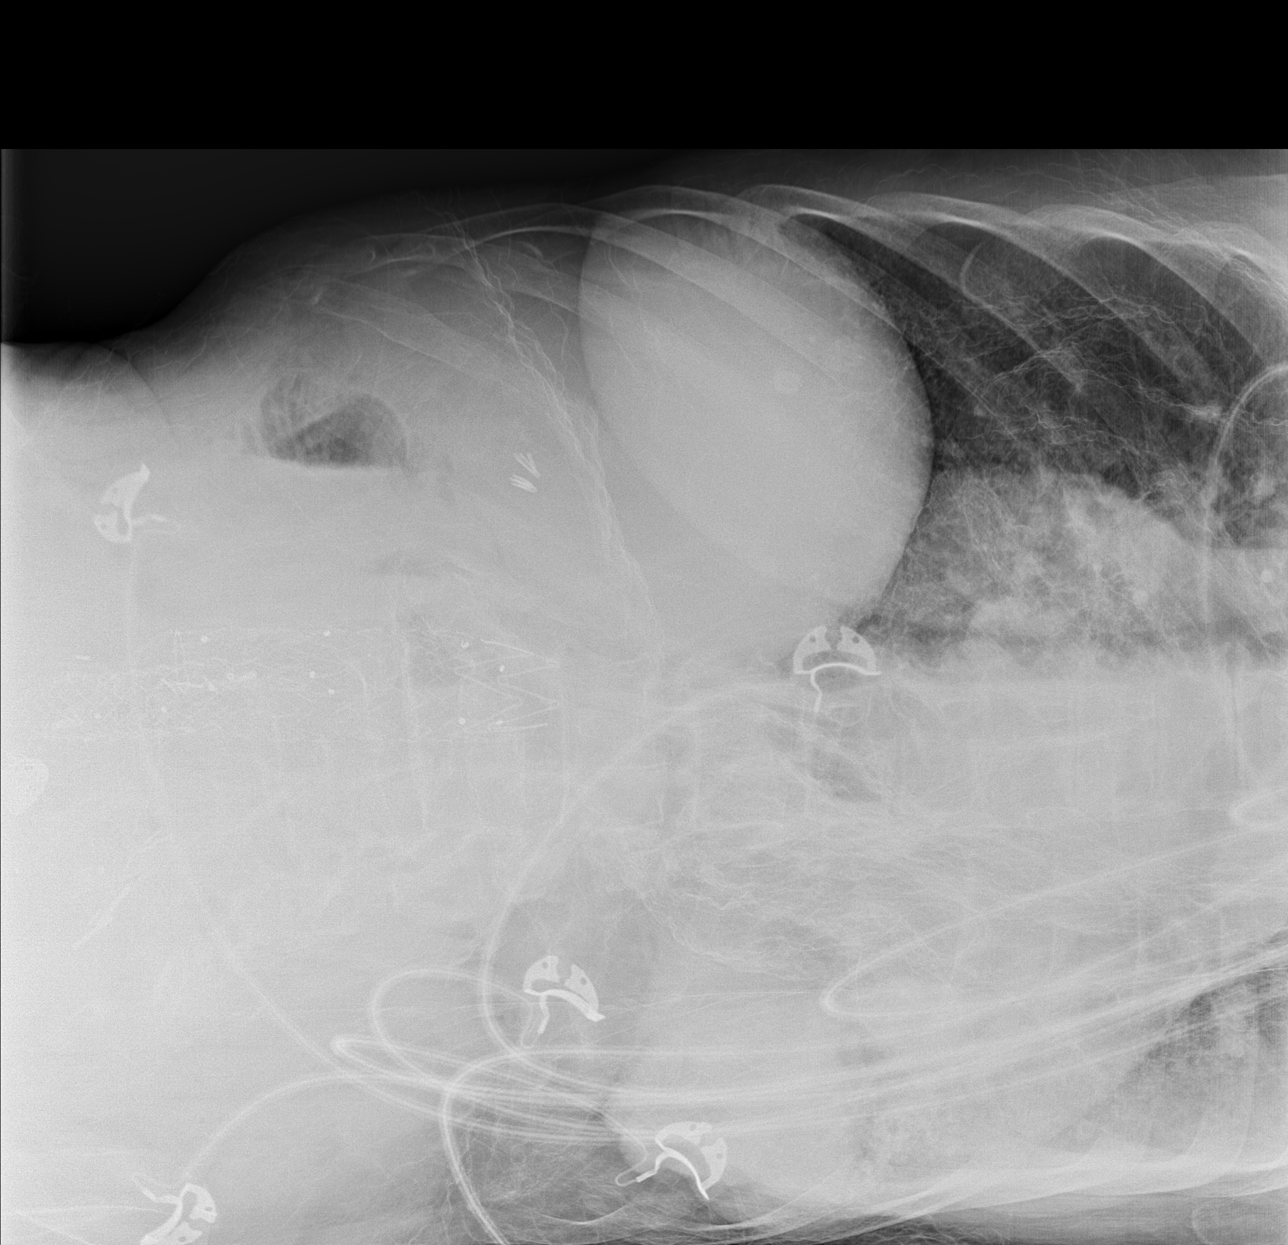

[x abdomen supine (1 of 2)]
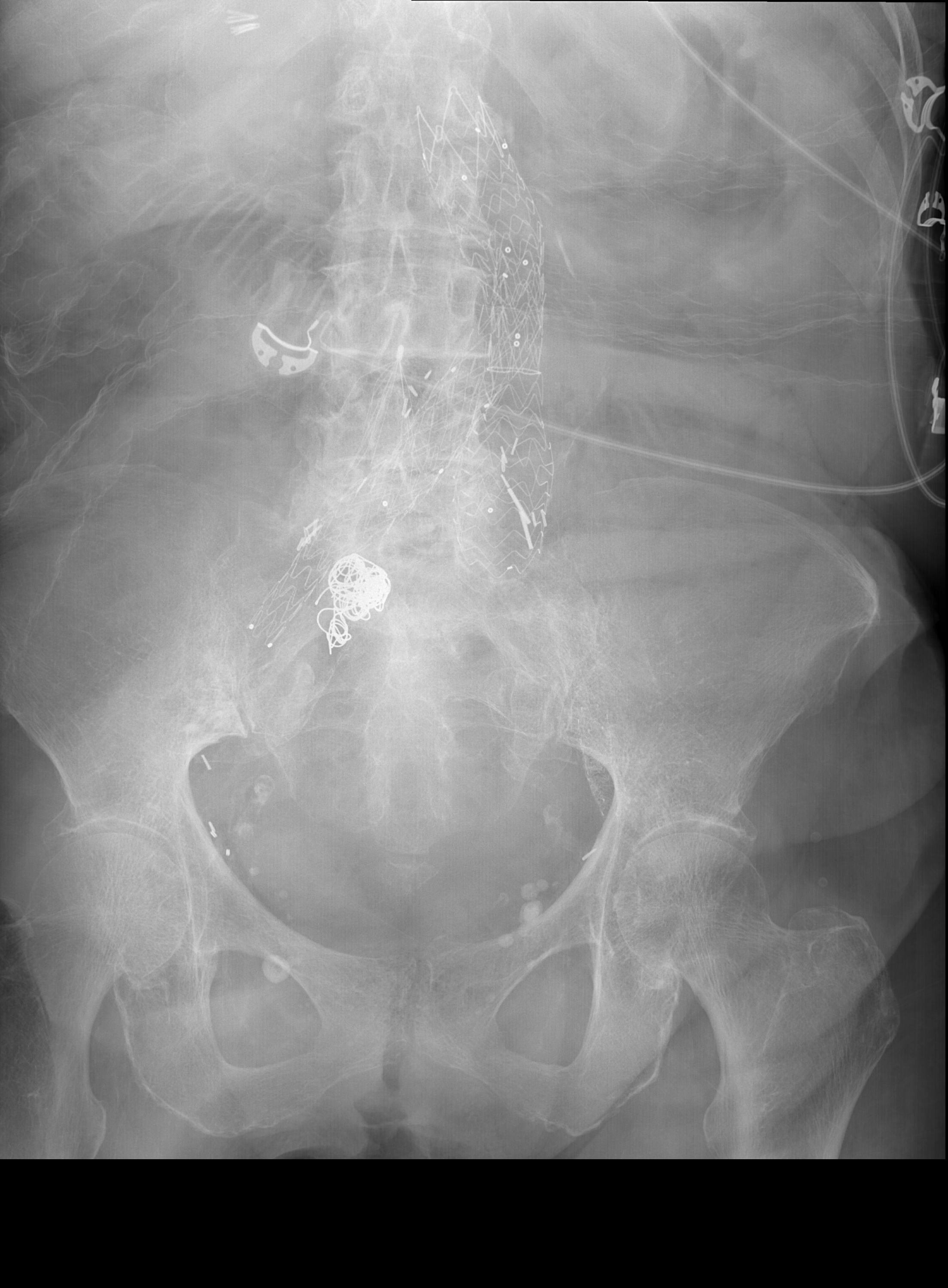

[x abdomen supine (2 of 2)]
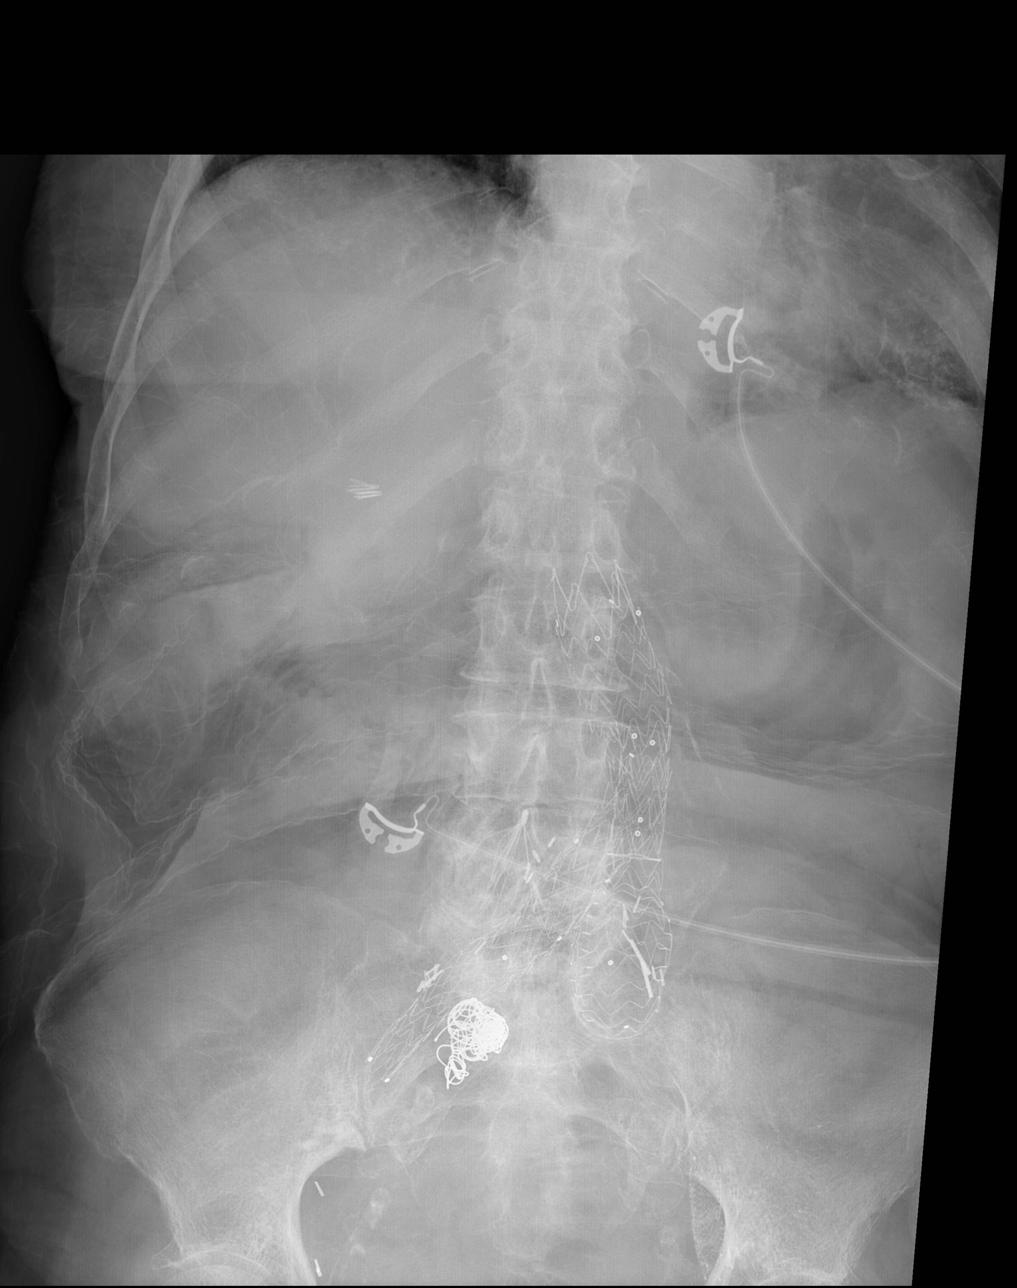

[x chest ap]
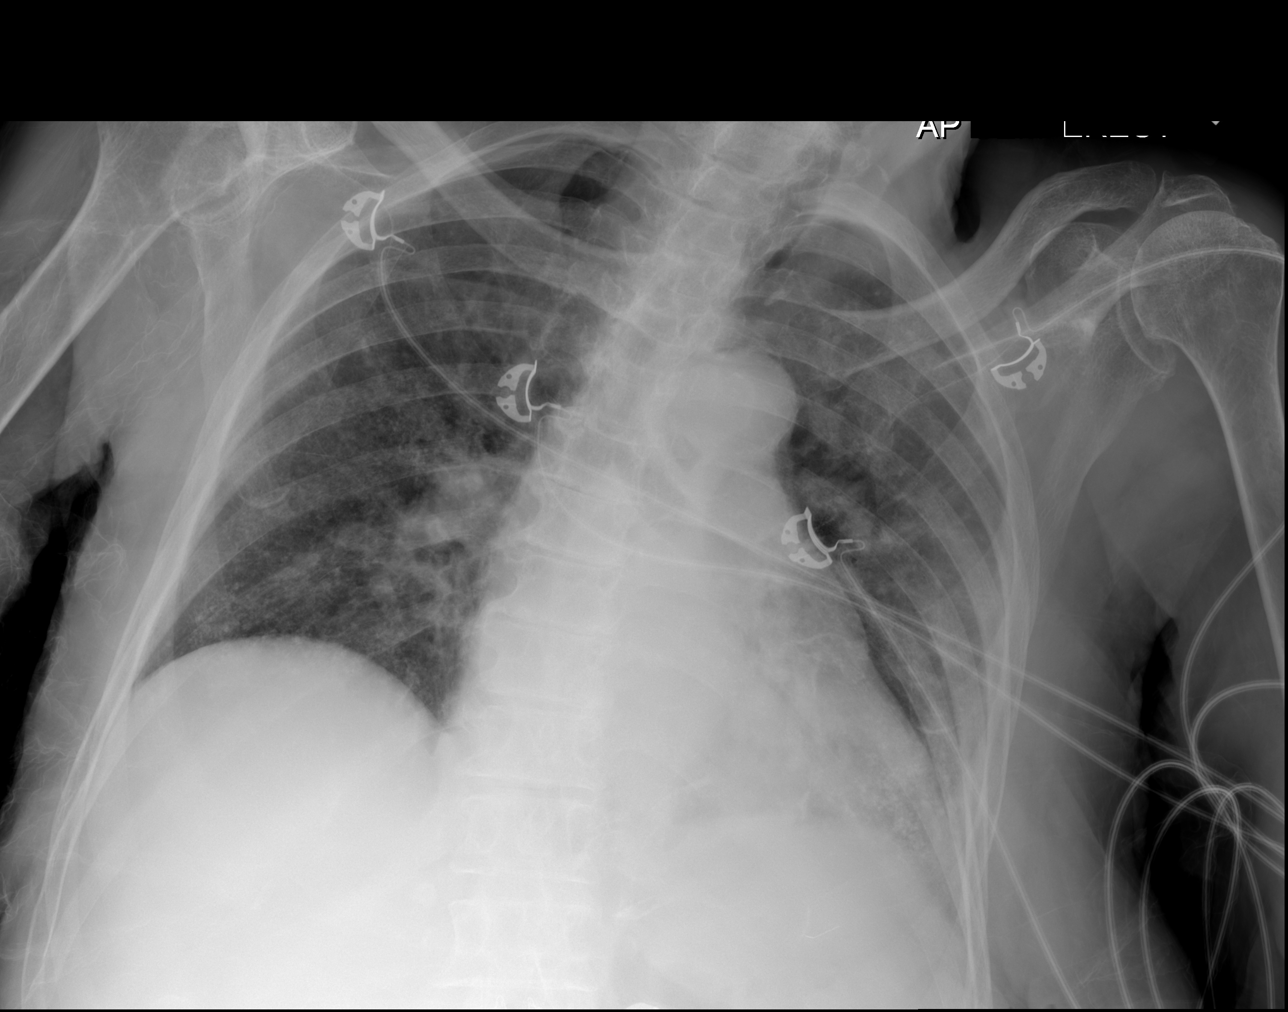

[4 of 4 positions shown; findings below may reference images not displayed]

FINDINGS: Stable cardiomegaly. Stable interstitial and reticular prominence.
Reticulonodular opacities at the lung bases, may be calcified,
unchanged. No new focal airspace disease.

No evidence of free intra-abdominal air. Dilated small bowel loops
in the central abdomen measures up to 3.8 cm. Multiple serpiginous
and irregular linear opacities projecting over the upper abdomen are
likely external to the patient. There are cholecystectomy clips.
Post aorta bi-iliac stenting.

Unchanged osseous structures.
IMPRESSION: 1. Air-filled dilated loops of small bowel in the central abdomen,
similar to prior exam. Recurrent small bowel obstruction not
excluded.
2. Suspected overlying artifact partially obscuring evaluation of
the abdomen.
3. Chronic changes in the lungs with lower lobe predominant
reticulonodular opacities that may be calcified. No confluent
consolidation to suggest pneumonia.

## 2018-04-18 IMAGING — DX DG ABD PORTABLE 1V
1 series · 1 of 1 positions shown · non-contrast
Comparison: December 01, 2015

CLINICAL DATA: Small bowel obstruction.

EXAM:
PORTABLE ABDOMEN - 1 VIEW

[abdomen kub]
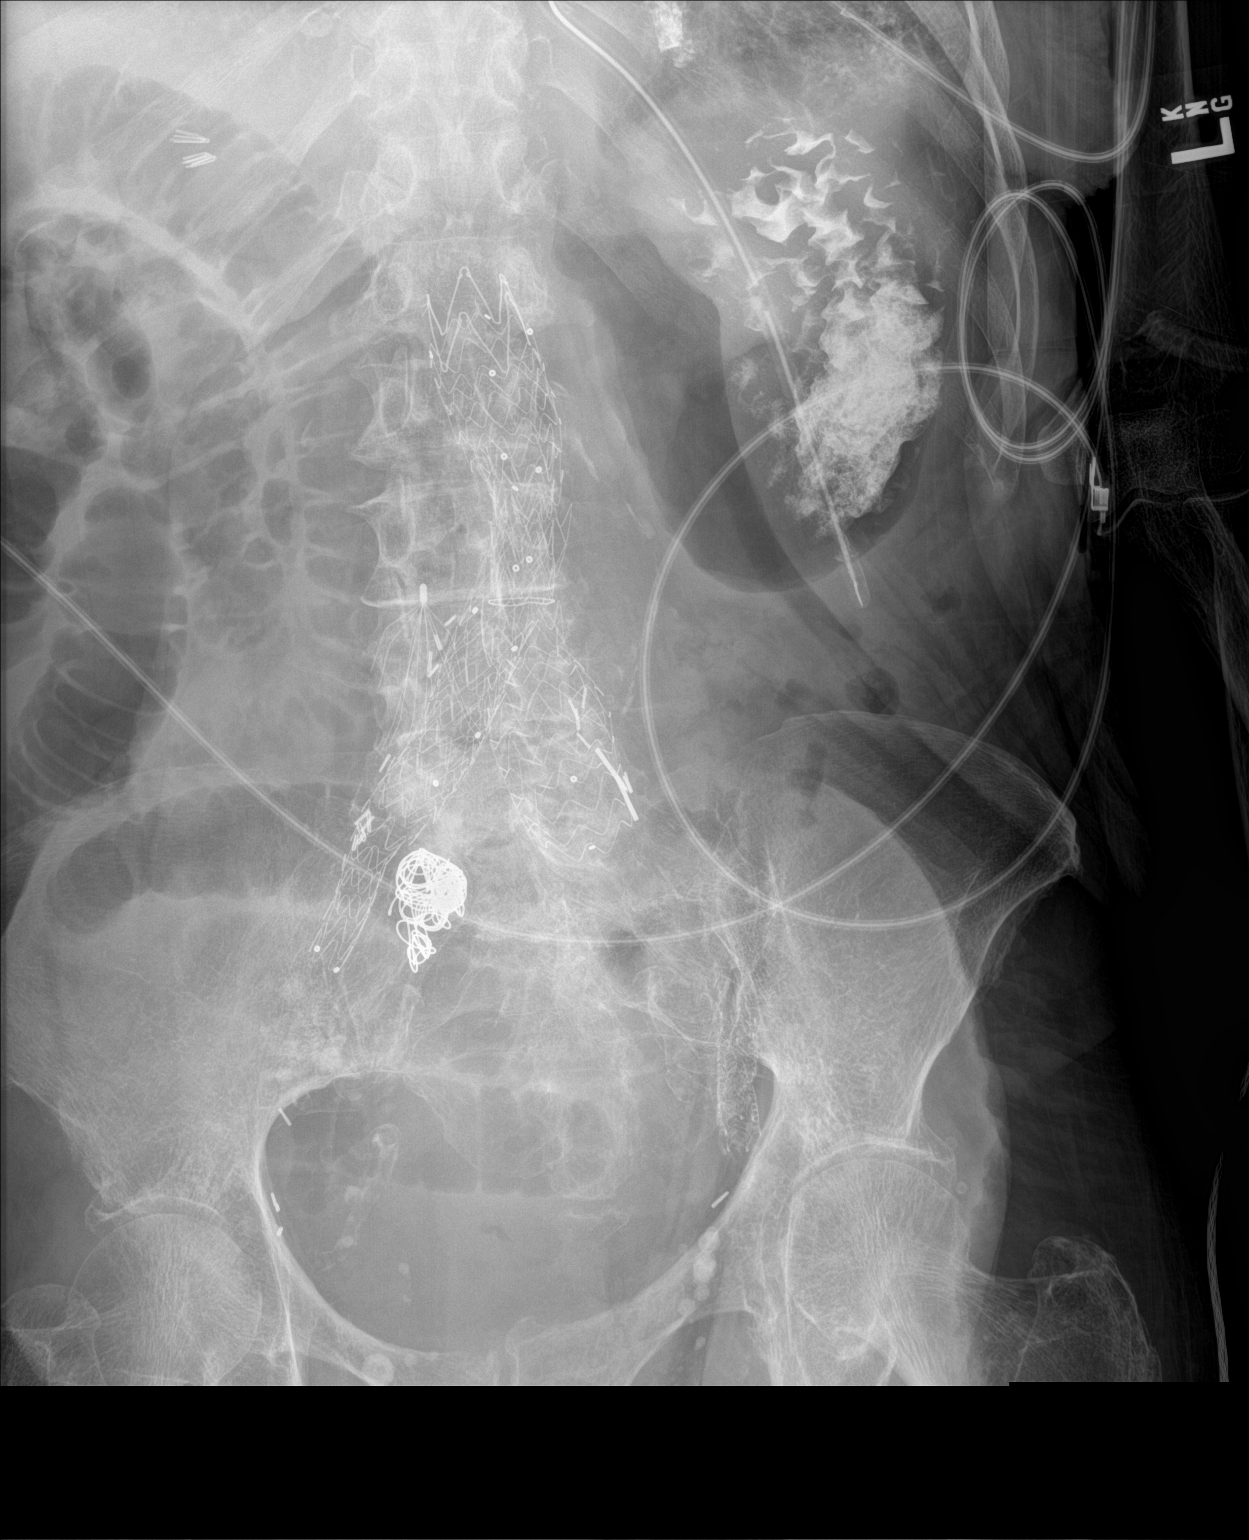

[1 of 1 positions shown; findings below may reference images not displayed]

FINDINGS: The NG tube is stable. A small amount of contrast is seen in the
stomach, new in the interval. No other contrast seen throughout the
abdomen. Persistently dilated small bowel loops in the right abdomen
consistent with small bowel obstruction. No other change.
IMPRESSION: Small bowel obstruction.
